# Patient Record
Sex: Female | Born: 1935
Health system: Southern US, Community
[De-identification: ages and names within clinical notes are randomized; demographics above are authoritative.]

## PROBLEM LIST (undated history)

## (undated) DIAGNOSIS — S72002A Fracture of unspecified part of neck of left femur, initial encounter for closed fracture: Secondary | ICD-10-CM

## (undated) DIAGNOSIS — E039 Hypothyroidism, unspecified: Secondary | ICD-10-CM

## (undated) DIAGNOSIS — I5030 Unspecified diastolic (congestive) heart failure: Secondary | ICD-10-CM

## (undated) DIAGNOSIS — E785 Hyperlipidemia, unspecified: Secondary | ICD-10-CM

## (undated) DIAGNOSIS — R310 Gross hematuria: Secondary | ICD-10-CM

## (undated) DIAGNOSIS — Z9289 Personal history of other medical treatment: Secondary | ICD-10-CM

## (undated) DIAGNOSIS — J449 Chronic obstructive pulmonary disease, unspecified: Secondary | ICD-10-CM

## (undated) DIAGNOSIS — Z85038 Personal history of other malignant neoplasm of large intestine: Secondary | ICD-10-CM

## (undated) DIAGNOSIS — N183 Chronic kidney disease, stage 3 (moderate): Secondary | ICD-10-CM

## (undated) DIAGNOSIS — D649 Anemia, unspecified: Secondary | ICD-10-CM

## (undated) DIAGNOSIS — R3129 Other microscopic hematuria: Secondary | ICD-10-CM

## (undated) DIAGNOSIS — I10 Essential (primary) hypertension: Secondary | ICD-10-CM

## (undated) DIAGNOSIS — J181 Lobar pneumonia, unspecified organism: Secondary | ICD-10-CM

## (undated) DIAGNOSIS — N3001 Acute cystitis with hematuria: Secondary | ICD-10-CM

## (undated) HISTORY — DX: Fracture of unspecified part of neck of left femur, initial encounter for closed fracture: S72.002A

## (undated) HISTORY — DX: Unspecified diastolic (congestive) heart failure: I50.30

## (undated) HISTORY — DX: Acute cystitis with hematuria: N30.01

## (undated) HISTORY — DX: Essential (primary) hypertension: I10

## (undated) HISTORY — DX: Hypothyroidism, unspecified: E03.9

## (undated) HISTORY — DX: Lobar pneumonia, unspecified organism: J18.1

## (undated) HISTORY — DX: Personal history of other malignant neoplasm of large intestine: Z85.038

## (undated) HISTORY — DX: Hyperlipidemia, unspecified: E78.5

## (undated) HISTORY — DX: Gross hematuria: R31.0

## (undated) HISTORY — DX: Anemia, unspecified: D64.9

## (undated) HISTORY — DX: Personal history of other medical treatment: Z92.89

## (undated) HISTORY — DX: Other microscopic hematuria: R31.29

## (undated) HISTORY — DX: Chronic obstructive pulmonary disease, unspecified: J44.9

## (undated) HISTORY — PX: ESOPHAGOGASTRODUODENOSCOPY: SHX1529

## (undated) HISTORY — DX: Chronic kidney disease, stage 3 (moderate): N18.3

---

## 1982-01-25 HISTORY — PX: CHOLECYSTECTOMY: SHX55

## 1998-10-26 DIAGNOSIS — I10 Essential (primary) hypertension: Secondary | ICD-10-CM

## 1998-10-26 HISTORY — DX: Essential (primary) hypertension: I10

## 1999-06-02 ENCOUNTER — Encounter: Payer: Self-pay | Admitting: Family Medicine

## 1999-06-02 ENCOUNTER — Encounter: Admission: RE | Admit: 1999-06-02 | Discharge: 1999-06-02 | Payer: Self-pay | Admitting: Family Medicine

## 1999-07-23 ENCOUNTER — Other Ambulatory Visit: Admission: RE | Admit: 1999-07-23 | Discharge: 1999-07-23 | Payer: Self-pay | Admitting: Family Medicine

## 2000-07-25 ENCOUNTER — Encounter: Payer: Self-pay | Admitting: Family Medicine

## 2000-07-25 LAB — CONVERTED CEMR LAB: Pap Smear: NORMAL

## 2000-08-01 ENCOUNTER — Other Ambulatory Visit: Admission: RE | Admit: 2000-08-01 | Discharge: 2000-08-01 | Payer: Self-pay | Admitting: Family Medicine

## 2000-08-02 ENCOUNTER — Encounter: Payer: Self-pay | Admitting: Family Medicine

## 2000-08-02 ENCOUNTER — Encounter: Admission: RE | Admit: 2000-08-02 | Discharge: 2000-08-02 | Payer: Self-pay | Admitting: Family Medicine

## 2001-08-04 ENCOUNTER — Encounter: Admission: RE | Admit: 2001-08-04 | Discharge: 2001-08-04 | Payer: Self-pay | Admitting: Family Medicine

## 2001-08-04 ENCOUNTER — Encounter: Payer: Self-pay | Admitting: Family Medicine

## 2002-08-07 ENCOUNTER — Encounter: Admission: RE | Admit: 2002-08-07 | Discharge: 2002-08-07 | Payer: Self-pay | Admitting: Family Medicine

## 2002-08-07 ENCOUNTER — Encounter: Payer: Self-pay | Admitting: Family Medicine

## 2002-12-26 DIAGNOSIS — E785 Hyperlipidemia, unspecified: Secondary | ICD-10-CM

## 2002-12-26 HISTORY — DX: Hyperlipidemia, unspecified: E78.5

## 2003-08-09 ENCOUNTER — Encounter: Admission: RE | Admit: 2003-08-09 | Discharge: 2003-08-09 | Payer: Self-pay | Admitting: Family Medicine

## 2004-01-03 ENCOUNTER — Ambulatory Visit: Payer: Self-pay | Admitting: Family Medicine

## 2004-01-07 ENCOUNTER — Ambulatory Visit: Payer: Self-pay | Admitting: Family Medicine

## 2004-02-21 ENCOUNTER — Ambulatory Visit: Payer: Self-pay | Admitting: Family Medicine

## 2004-07-08 ENCOUNTER — Ambulatory Visit: Payer: Self-pay | Admitting: Family Medicine

## 2004-12-25 ENCOUNTER — Encounter: Payer: Self-pay | Admitting: Family Medicine

## 2004-12-25 LAB — CONVERTED CEMR LAB: Hgb A1c MFr Bld: 5.5 %

## 2004-12-29 ENCOUNTER — Ambulatory Visit: Payer: Self-pay | Admitting: Family Medicine

## 2005-01-01 ENCOUNTER — Ambulatory Visit: Payer: Self-pay | Admitting: Family Medicine

## 2005-01-22 ENCOUNTER — Ambulatory Visit: Payer: Self-pay | Admitting: Family Medicine

## 2005-01-29 ENCOUNTER — Encounter: Admission: RE | Admit: 2005-01-29 | Discharge: 2005-01-29 | Payer: Self-pay | Admitting: Family Medicine

## 2005-02-01 ENCOUNTER — Ambulatory Visit: Payer: Self-pay | Admitting: Family Medicine

## 2005-02-25 ENCOUNTER — Encounter: Payer: Self-pay | Admitting: Family Medicine

## 2005-03-08 ENCOUNTER — Ambulatory Visit: Payer: Self-pay | Admitting: Family Medicine

## 2005-07-02 ENCOUNTER — Ambulatory Visit: Payer: Self-pay | Admitting: Family Medicine

## 2005-12-25 ENCOUNTER — Encounter: Payer: Self-pay | Admitting: Family Medicine

## 2005-12-25 LAB — CONVERTED CEMR LAB
Hgb A1c MFr Bld: 6.3 %
Microalbumin U total vol: 5.1 mg/L

## 2005-12-30 ENCOUNTER — Ambulatory Visit: Payer: Self-pay | Admitting: Family Medicine

## 2006-01-03 ENCOUNTER — Ambulatory Visit: Payer: Self-pay | Admitting: Family Medicine

## 2006-01-23 DIAGNOSIS — Z862 Personal history of diseases of the blood and blood-forming organs and certain disorders involving the immune mechanism: Secondary | ICD-10-CM | POA: Insufficient documentation

## 2006-01-24 ENCOUNTER — Encounter: Payer: Self-pay | Admitting: Cardiology

## 2006-01-24 ENCOUNTER — Observation Stay (HOSPITAL_COMMUNITY): Admission: EM | Admit: 2006-01-24 | Discharge: 2006-01-25 | Payer: Self-pay | Admitting: Emergency Medicine

## 2006-01-24 ENCOUNTER — Ambulatory Visit: Payer: Self-pay | Admitting: Internal Medicine

## 2006-01-24 ENCOUNTER — Ambulatory Visit: Payer: Self-pay | Admitting: Cardiology

## 2006-01-28 ENCOUNTER — Ambulatory Visit: Payer: Self-pay | Admitting: Family Medicine

## 2006-01-31 ENCOUNTER — Encounter: Admission: RE | Admit: 2006-01-31 | Discharge: 2006-01-31 | Payer: Self-pay | Admitting: Family Medicine

## 2006-02-02 ENCOUNTER — Ambulatory Visit: Payer: Self-pay | Admitting: Family Medicine

## 2006-02-08 ENCOUNTER — Encounter: Admission: RE | Admit: 2006-02-08 | Discharge: 2006-02-08 | Payer: Self-pay | Admitting: Family Medicine

## 2006-02-09 ENCOUNTER — Ambulatory Visit: Payer: Self-pay | Admitting: Family Medicine

## 2006-02-09 LAB — CONVERTED CEMR LAB
Ferritin: 76.2 ng/mL (ref 10.0–291.0)
Folate: 3.9 ng/mL
MCHC: 31.4 g/dL (ref 30.0–36.0)

## 2006-02-14 ENCOUNTER — Ambulatory Visit: Payer: Self-pay | Admitting: Family Medicine

## 2006-02-15 ENCOUNTER — Ambulatory Visit: Payer: Self-pay | Admitting: Gastroenterology

## 2006-02-16 ENCOUNTER — Ambulatory Visit: Payer: Self-pay | Admitting: Gastroenterology

## 2006-02-16 ENCOUNTER — Encounter (INDEPENDENT_AMBULATORY_CARE_PROVIDER_SITE_OTHER): Payer: Self-pay | Admitting: Specialist

## 2006-02-22 ENCOUNTER — Encounter (INDEPENDENT_AMBULATORY_CARE_PROVIDER_SITE_OTHER): Payer: Self-pay | Admitting: Specialist

## 2006-02-22 ENCOUNTER — Ambulatory Visit: Payer: Self-pay | Admitting: Gastroenterology

## 2006-02-22 LAB — CONVERTED CEMR LAB: BUN: 63 mg/dL — ABNORMAL HIGH (ref 6–23)

## 2006-02-25 ENCOUNTER — Ambulatory Visit (HOSPITAL_COMMUNITY): Admission: RE | Admit: 2006-02-25 | Discharge: 2006-02-25 | Payer: Self-pay | Admitting: Internal Medicine

## 2006-02-25 HISTORY — PX: OTHER SURGICAL HISTORY: SHX169

## 2006-02-28 ENCOUNTER — Ambulatory Visit: Payer: Self-pay | Admitting: Family Medicine

## 2006-03-02 ENCOUNTER — Ambulatory Visit: Payer: Self-pay | Admitting: Family Medicine

## 2006-03-02 LAB — CONVERTED CEMR LAB
CO2: 17 meq/L — ABNORMAL LOW (ref 19–32)
Calcium: 9.1 mg/dL (ref 8.4–10.5)
Chloride: 109 meq/L (ref 96–112)
Creatinine, Ser: 2.7 mg/dL — ABNORMAL HIGH (ref 0.4–1.2)
GFR calc non Af Amer: 18 mL/min
Potassium: 5.8 meq/L — ABNORMAL HIGH (ref 3.5–5.1)
Sodium: 134 meq/L — ABNORMAL LOW (ref 135–145)

## 2006-03-09 ENCOUNTER — Ambulatory Visit: Payer: Self-pay | Admitting: Family Medicine

## 2006-03-09 ENCOUNTER — Ambulatory Visit: Payer: Self-pay | Admitting: Cardiovascular Disease

## 2006-03-09 LAB — CONVERTED CEMR LAB
Albumin: 2.7 g/dL — ABNORMAL LOW (ref 3.5–5.2)
Alkaline Phosphatase: 51 units/L (ref 39–117)
Basophils Relative: 0.2 % (ref 0.0–1.0)
Calcium: 9 mg/dL (ref 8.4–10.5)
Eosinophils Absolute: 0.3 10*3/uL (ref 0.0–0.6)
Eosinophils Relative: 5.1 % — ABNORMAL HIGH (ref 0.0–5.0)
GFR calc Af Amer: 46 mL/min
GFR calc non Af Amer: 38 mL/min
Glucose, Bld: 99 mg/dL (ref 70–99)
MCHC: 33.1 g/dL (ref 30.0–36.0)
MCV: 79.3 fL (ref 78.0–100.0)
Monocytes Absolute: 0.6 10*3/uL (ref 0.2–0.7)
Monocytes Relative: 8.9 % (ref 3.0–11.0)
Neutrophils Relative %: 70.1 % (ref 43.0–77.0)
Platelets: 162 10*3/uL (ref 150–400)
RBC: 3.73 M/uL — ABNORMAL LOW (ref 3.87–5.11)
RDW: 26 % — ABNORMAL HIGH (ref 11.5–14.6)
Total Bilirubin: 0.8 mg/dL (ref 0.3–1.2)

## 2006-03-15 ENCOUNTER — Ambulatory Visit: Payer: Self-pay

## 2006-03-15 DIAGNOSIS — Z9289 Personal history of other medical treatment: Secondary | ICD-10-CM

## 2006-03-15 HISTORY — DX: Personal history of other medical treatment: Z92.89

## 2006-03-16 ENCOUNTER — Ambulatory Visit: Payer: Self-pay | Admitting: Family Medicine

## 2006-03-16 LAB — CONVERTED CEMR LAB
Basophils Absolute: 0.1 10*3/uL (ref 0.0–0.1)
Basophils Relative: 1 % (ref 0.0–1.0)
CO2: 27 meq/L (ref 19–32)
Creatinine, Ser: 1.1 mg/dL (ref 0.4–1.2)
Eosinophils Absolute: 0.2 10*3/uL (ref 0.0–0.6)
Eosinophils Relative: 3.1 % (ref 0.0–5.0)
GFR calc Af Amer: 61 mL/min
GFR calc non Af Amer: 50 mL/min
Iron: 51 ug/dL (ref 42–145)
MCHC: 33.2 g/dL (ref 30.0–36.0)
Monocytes Relative: 8.9 % (ref 3.0–11.0)
Neutrophils Relative %: 62.9 % (ref 43.0–77.0)
Platelets: 192 10*3/uL (ref 150–400)
RDW: 25.5 % — ABNORMAL HIGH (ref 11.5–14.6)
Vitamin B-12: 350 pg/mL (ref 211–911)
WBC: 6.2 10*3/uL (ref 4.5–10.5)

## 2006-03-23 ENCOUNTER — Encounter (INDEPENDENT_AMBULATORY_CARE_PROVIDER_SITE_OTHER): Payer: Self-pay | Admitting: Specialist

## 2006-03-23 ENCOUNTER — Inpatient Hospital Stay (HOSPITAL_COMMUNITY): Admission: RE | Admit: 2006-03-23 | Discharge: 2006-03-28 | Payer: Self-pay | Admitting: General Surgery

## 2006-03-25 HISTORY — PX: PARTIAL COLECTOMY: SHX5273

## 2006-04-17 ENCOUNTER — Ambulatory Visit: Payer: Self-pay | Admitting: Internal Medicine

## 2006-04-17 ENCOUNTER — Inpatient Hospital Stay (HOSPITAL_COMMUNITY): Admission: EM | Admit: 2006-04-17 | Discharge: 2006-04-20 | Payer: Self-pay | Admitting: Emergency Medicine

## 2006-04-18 ENCOUNTER — Encounter: Payer: Self-pay | Admitting: Internal Medicine

## 2006-04-22 ENCOUNTER — Ambulatory Visit: Payer: Self-pay | Admitting: Family Medicine

## 2006-05-02 ENCOUNTER — Ambulatory Visit: Payer: Self-pay | Admitting: Family Medicine

## 2006-05-02 LAB — CONVERTED CEMR LAB
ALT: 11 units/L (ref 0–40)
AST: 19 units/L (ref 0–37)
Basophils Absolute: 0 10*3/uL (ref 0.0–0.1)
Basophils Relative: 0.7 % (ref 0.0–1.0)
CO2: 24 meq/L (ref 19–32)
Calcium: 8.5 mg/dL (ref 8.4–10.5)
Chloride: 110 meq/L (ref 96–112)
Creatinine, Ser: 1.1 mg/dL (ref 0.4–1.2)
Eosinophils Relative: 1.1 % (ref 0.0–5.0)
Hemoglobin: 10.8 g/dL — ABNORMAL LOW (ref 12.0–15.0)
MCV: 89.8 fL (ref 78.0–100.0)
Monocytes Relative: 9.3 % (ref 3.0–11.0)
Neutro Abs: 4.6 10*3/uL (ref 1.4–7.7)
Platelets: 158 10*3/uL (ref 150–400)
Potassium: 4.2 meq/L (ref 3.5–5.1)
Retic Count, Absolute: 88.1 (ref 19.0–186.0)
Total Bilirubin: 0.8 mg/dL (ref 0.3–1.2)

## 2006-05-12 ENCOUNTER — Ambulatory Visit: Payer: Self-pay | Admitting: Family Medicine

## 2006-05-24 ENCOUNTER — Telehealth (INDEPENDENT_AMBULATORY_CARE_PROVIDER_SITE_OTHER): Payer: Self-pay | Admitting: *Deleted

## 2006-05-25 ENCOUNTER — Ambulatory Visit: Payer: Self-pay | Admitting: Family Medicine

## 2006-05-25 DIAGNOSIS — I5032 Chronic diastolic (congestive) heart failure: Secondary | ICD-10-CM

## 2006-05-25 DIAGNOSIS — R7303 Prediabetes: Secondary | ICD-10-CM

## 2006-06-02 ENCOUNTER — Ambulatory Visit: Payer: Self-pay | Admitting: Family Medicine

## 2006-06-02 LAB — CONVERTED CEMR LAB
Basophils Absolute: 0 10*3/uL (ref 0.0–0.1)
Eosinophils Relative: 2.5 % (ref 0.0–5.0)
Hemoglobin: 11.3 g/dL — ABNORMAL LOW (ref 12.0–15.0)
Lymphocytes Relative: 46.7 % — ABNORMAL HIGH (ref 12.0–46.0)
MCHC: 33.1 g/dL (ref 30.0–36.0)
Monocytes Absolute: 0.6 10*3/uL (ref 0.2–0.7)
Neutro Abs: 3.3 10*3/uL (ref 1.4–7.7)
Neutrophils Relative %: 42.5 % — ABNORMAL LOW (ref 43.0–77.0)
Platelets: 304 10*3/uL (ref 150–400)
RBC: 3.9 M/uL (ref 3.87–5.11)
RDW: 13.7 % (ref 11.5–14.6)
TSH: 4.18 microintl units/mL (ref 0.35–5.50)

## 2006-06-08 ENCOUNTER — Ambulatory Visit: Payer: Self-pay | Admitting: Family Medicine

## 2006-07-06 ENCOUNTER — Ambulatory Visit: Payer: Self-pay | Admitting: Family Medicine

## 2006-07-06 LAB — CONVERTED CEMR LAB
HCT: 35.3 % — ABNORMAL LOW (ref 36.0–46.0)
Hemoglobin: 11.9 g/dL — ABNORMAL LOW (ref 12.0–15.0)
Iron: 102 ug/dL (ref 42–145)
Lymphocytes Relative: 50.2 % — ABNORMAL HIGH (ref 12.0–46.0)
MCHC: 33.7 g/dL (ref 30.0–36.0)
Monocytes Relative: 7.3 % (ref 3.0–11.0)
Neutro Abs: 2.5 10*3/uL (ref 1.4–7.7)
Neutrophils Relative %: 39.4 % — ABNORMAL LOW (ref 43.0–77.0)
Platelets: 168 10*3/uL (ref 150–400)

## 2006-07-07 ENCOUNTER — Encounter: Payer: Self-pay | Admitting: Family Medicine

## 2006-07-07 DIAGNOSIS — I10 Essential (primary) hypertension: Secondary | ICD-10-CM | POA: Insufficient documentation

## 2006-07-07 DIAGNOSIS — E785 Hyperlipidemia, unspecified: Secondary | ICD-10-CM

## 2006-07-11 ENCOUNTER — Ambulatory Visit: Payer: Self-pay | Admitting: Family Medicine

## 2006-11-23 ENCOUNTER — Encounter: Payer: Self-pay | Admitting: Family Medicine

## 2007-01-09 ENCOUNTER — Ambulatory Visit: Payer: Self-pay | Admitting: Family Medicine

## 2007-01-09 DIAGNOSIS — D126 Benign neoplasm of colon, unspecified: Secondary | ICD-10-CM

## 2007-01-09 DIAGNOSIS — Z862 Personal history of diseases of the blood and blood-forming organs and certain disorders involving the immune mechanism: Secondary | ICD-10-CM

## 2007-01-09 LAB — CONVERTED CEMR LAB
Alkaline Phosphatase: 49 units/L (ref 39–117)
Basophils Absolute: 0 10*3/uL (ref 0.0–0.1)
Bilirubin, Direct: 0.2 mg/dL (ref 0.0–0.3)
CO2: 32 meq/L (ref 19–32)
Chloride: 108 meq/L (ref 96–112)
Creatinine, Ser: 1.3 mg/dL — ABNORMAL HIGH (ref 0.4–1.2)
Eosinophils Absolute: 0.2 10*3/uL (ref 0.0–0.6)
Eosinophils Relative: 4 % (ref 0.0–5.0)
GFR calc Af Amer: 50 mL/min
GFR calc non Af Amer: 41 mL/min
Glucose, Bld: 94 mg/dL (ref 70–99)
LDL Cholesterol: 97 mg/dL (ref 0–99)
Monocytes Absolute: 0.5 10*3/uL (ref 0.2–0.7)
Monocytes Relative: 8.5 % (ref 3.0–11.0)
Neutro Abs: 2.7 10*3/uL (ref 1.4–7.7)
Neutrophils Relative %: 48.8 % (ref 43.0–77.0)
RBC: 4.07 M/uL (ref 3.87–5.11)
RDW: 12.7 % (ref 11.5–14.6)
Sodium: 147 meq/L — ABNORMAL HIGH (ref 135–145)
Total Bilirubin: 0.7 mg/dL (ref 0.3–1.2)
Vitamin B-12: 636 pg/mL (ref 211–911)

## 2007-01-12 ENCOUNTER — Ambulatory Visit: Payer: Self-pay | Admitting: Family Medicine

## 2007-02-22 ENCOUNTER — Encounter: Payer: Self-pay | Admitting: Family Medicine

## 2007-03-03 ENCOUNTER — Encounter: Admission: RE | Admit: 2007-03-03 | Discharge: 2007-03-03 | Payer: Self-pay | Admitting: Family Medicine

## 2007-03-03 LAB — HM MAMMOGRAPHY: HM Mammogram: NORMAL

## 2007-03-06 ENCOUNTER — Encounter (INDEPENDENT_AMBULATORY_CARE_PROVIDER_SITE_OTHER): Payer: Self-pay | Admitting: *Deleted

## 2007-03-16 ENCOUNTER — Ambulatory Visit: Payer: Self-pay | Admitting: Gastroenterology

## 2007-03-16 ENCOUNTER — Encounter: Payer: Self-pay | Admitting: Family Medicine

## 2007-04-13 ENCOUNTER — Encounter: Payer: Self-pay | Admitting: Family Medicine

## 2007-04-13 ENCOUNTER — Ambulatory Visit: Payer: Self-pay | Admitting: Gastroenterology

## 2007-04-13 LAB — HM COLONOSCOPY

## 2007-07-12 ENCOUNTER — Ambulatory Visit: Payer: Self-pay | Admitting: Family Medicine

## 2007-07-12 LAB — CONVERTED CEMR LAB
Basophils Absolute: 0 10*3/uL (ref 0.0–0.1)
Eosinophils Relative: 2.2 % (ref 0.0–5.0)
Hgb A1c MFr Bld: 5.5 % (ref 4.6–6.0)
Lymphocytes Relative: 37.6 % (ref 12.0–46.0)
Monocytes Absolute: 0.5 10*3/uL (ref 0.1–1.0)
Monocytes Relative: 7.9 % (ref 3.0–12.0)
Neutro Abs: 3.6 10*3/uL (ref 1.4–7.7)
Neutrophils Relative %: 52.3 % (ref 43.0–77.0)
RBC: 4.24 M/uL (ref 3.87–5.11)

## 2007-07-17 ENCOUNTER — Ambulatory Visit: Payer: Self-pay | Admitting: Family Medicine

## 2007-10-31 ENCOUNTER — Ambulatory Visit: Payer: Self-pay | Admitting: Family Medicine

## 2008-01-23 ENCOUNTER — Ambulatory Visit: Payer: Self-pay | Admitting: Family Medicine

## 2008-01-23 LAB — CONVERTED CEMR LAB
ALT: 14 units/L (ref 0–35)
Basophils Absolute: 0 10*3/uL (ref 0.0–0.1)
CO2: 31 meq/L (ref 19–32)
Calcium: 9.4 mg/dL (ref 8.4–10.5)
Creatinine,U: 302.7 mg/dL
Free T4: 0.7 ng/dL (ref 0.6–1.6)
Glucose, Bld: 107 mg/dL — ABNORMAL HIGH (ref 70–99)
HDL: 53 mg/dL (ref >39.0)
Hemoglobin: 13.4 g/dL (ref 12.0–15.0)
Iron: 77 ug/dL (ref 42–145)
Microalb Creat Ratio: 15.9 mg/g (ref 0.0–30.0)
Monocytes Absolute: 0.5 10*3/uL (ref 0.1–1.0)
Neutro Abs: 3.8 10*3/uL (ref 1.4–7.7)
Neutrophils Relative %: 55.6 % (ref 43.0–77.0)
Platelets: 152 10*3/uL (ref 150–400)
Potassium: 3.7 meq/L (ref 3.5–5.1)
RBC: 4.14 M/uL (ref 3.87–5.11)
TSH: 7.13 microintl units/mL — ABNORMAL HIGH (ref 0.35–5.50)
Total Bilirubin: 0.6 mg/dL (ref 0.3–1.2)
Total CHOL/HDL Ratio: 2.9
Triglycerides: 92 mg/dL (ref 0–149)
VLDL: 18 mg/dL (ref 0–40)
Vitamin B-12: >1500 pg/mL — ABNORMAL HIGH (ref 211–911)
WBC: 6.8 10*3/uL (ref 4.5–10.5)

## 2008-01-30 ENCOUNTER — Ambulatory Visit: Payer: Self-pay | Admitting: Family Medicine

## 2008-03-26 ENCOUNTER — Encounter: Payer: Self-pay | Admitting: Family Medicine

## 2008-06-12 ENCOUNTER — Encounter: Payer: Self-pay | Admitting: Family Medicine

## 2008-07-30 ENCOUNTER — Ambulatory Visit: Payer: Self-pay | Admitting: Family Medicine

## 2008-08-16 ENCOUNTER — Encounter: Payer: Self-pay | Admitting: Family Medicine

## 2008-08-19 ENCOUNTER — Encounter: Payer: Self-pay | Admitting: Family Medicine

## 2008-09-04 ENCOUNTER — Ambulatory Visit: Payer: Self-pay | Admitting: Family Medicine

## 2008-09-04 LAB — CONVERTED CEMR LAB
Chloride: 107 meq/L (ref 96–112)
Hgb A1c MFr Bld: 5.6 % (ref 4.6–6.5)
Potassium: 4.1 meq/L (ref 3.5–5.1)
Sodium: 143 meq/L (ref 135–145)

## 2008-09-09 ENCOUNTER — Ambulatory Visit: Payer: Self-pay | Admitting: Family Medicine

## 2008-10-31 ENCOUNTER — Encounter: Payer: Self-pay | Admitting: Family Medicine

## 2008-11-01 ENCOUNTER — Encounter: Payer: Self-pay | Admitting: Family Medicine

## 2008-11-10 ENCOUNTER — Encounter: Payer: Self-pay | Admitting: Family Medicine

## 2008-11-12 ENCOUNTER — Encounter: Payer: Self-pay | Admitting: Family Medicine

## 2008-12-05 ENCOUNTER — Ambulatory Visit: Payer: Self-pay | Admitting: Family Medicine

## 2008-12-05 LAB — CONVERTED CEMR LAB
CO2: 29 meq/L (ref 19–32)
Chloride: 107 meq/L (ref 96–112)
Creatinine, Ser: 1.5 mg/dL — ABNORMAL HIGH (ref 0.4–1.2)
GFR calc non Af Amer: 35 mL/min (ref >60)
Glucose, Bld: 97 mg/dL (ref 70–99)
Potassium: 4.2 meq/L (ref 3.5–5.1)

## 2008-12-11 ENCOUNTER — Ambulatory Visit: Payer: Self-pay | Admitting: Family Medicine

## 2009-02-25 ENCOUNTER — Ambulatory Visit: Payer: Self-pay | Admitting: Family Medicine

## 2009-02-25 DIAGNOSIS — E559 Vitamin D deficiency, unspecified: Secondary | ICD-10-CM | POA: Insufficient documentation

## 2009-02-25 LAB — CONVERTED CEMR LAB
ALT: 14 units/L (ref 0–35)
AST: 16 units/L (ref 0–37)
Albumin: 3.3 g/dL — ABNORMAL LOW (ref 3.5–5.2)
Alkaline Phosphatase: 46 units/L (ref 39–117)
Basophils Absolute: 0 10*3/uL (ref 0.0–0.1)
Basophils Relative: 0 % (ref 0.0–3.0)
Bilirubin, Direct: 0.1 mg/dL (ref 0.0–0.3)
Cholesterol: 161 mg/dL (ref 0–200)
HCT: 38.2 % (ref 36.0–46.0)
HDL: 57.8 mg/dL (ref >39.00)
Hemoglobin: 12.7 g/dL (ref 12.0–15.0)
Iron: 88 ug/dL (ref 42–145)
Lymphocytes Relative: 28.1 % (ref 12.0–46.0)
Lymphs Abs: 2 10*3/uL (ref 0.7–4.0)
MCV: 95.8 fL (ref 78.0–100.0)
Monocytes Relative: 6.1 % (ref 3.0–12.0)
Neutro Abs: 4.5 10*3/uL (ref 1.4–7.7)
Neutrophils Relative %: 63.7 % (ref 43.0–77.0)
Platelets: 134 10*3/uL — ABNORMAL LOW (ref 150.0–400.0)
RDW: 13.3 % (ref 11.5–14.6)
TSH: 6.55 microintl units/mL — ABNORMAL HIGH (ref 0.35–5.50)
Total Protein: 6.3 g/dL (ref 6.0–8.3)
VLDL: 15.8 mg/dL (ref 0.0–40.0)
WBC: 7 10*3/uL (ref 4.5–10.5)

## 2009-03-03 ENCOUNTER — Ambulatory Visit: Payer: Self-pay | Admitting: Family Medicine

## 2009-05-28 ENCOUNTER — Ambulatory Visit: Payer: Self-pay | Admitting: Family Medicine

## 2009-06-02 ENCOUNTER — Ambulatory Visit: Payer: Self-pay | Admitting: Family Medicine

## 2009-09-01 ENCOUNTER — Encounter (INDEPENDENT_AMBULATORY_CARE_PROVIDER_SITE_OTHER): Payer: Self-pay | Admitting: *Deleted

## 2009-12-02 ENCOUNTER — Ambulatory Visit: Payer: Self-pay | Admitting: Family Medicine

## 2010-02-15 ENCOUNTER — Encounter: Payer: Self-pay | Admitting: Gastroenterology

## 2010-02-15 ENCOUNTER — Encounter: Payer: Self-pay | Admitting: Family Medicine

## 2010-02-24 NOTE — Letter (Signed)
Summary: Nadara Eaton letter  [place] at [facility]  [address] [place], [place] [address]   Phone: [phone]  Fax: [phone]       09/01/2009 MRN: [id number]  HALAH Stutz [address] [place], [place]  [address]  Dear Ms. Shiffler,  [facility] [place], and [place] announce the retirement of [name], M.D., from full-time practice at the [facility] office effective July 24, 2009 and his plans of returning part-time.  It is important to Dr. Hetty Ely and to our practice that you understand that [facility] has seven physicians in our office for your health care needs.  We will continue to offer the same exceptional care that you have today.    Dr. Hetty Ely has spoken to many of you about his plans for retirement and returning part-time in the fall.   We will continue to work with you through the transition to schedule appointments for you in the office and meet the high standards that [place] is committed to.   Again, it is with great pleasure that we share the news that Dr. Hetty Ely will return to [facility] at [facility] in October of 2011 with a reduced schedule.    If you have any questions, or would like to request an appointment with one of our physicians, please call us at [phone] and press the option for Scheduling an appointment.  We take pleasure in providing you with excellent patient care and look forward to seeing you at your next office visit.  Our [facility] Physicians are:  Tillman Abide, M.D. Laurita Quint, M.D. Roxy Manns, M.D. Kerby Nora, M.D. Hannah Beat, M.D. Ruthe Mannan, M.D. We proudly welcomed Raechel Ache, M.D. and Eustaquio Boyden, M.D. to the practice in July/August 2011.  Sincerely,  [facility]

## 2010-02-24 NOTE — Assessment & Plan Note (Signed)
Summary: CPX   Vital Signs:  Patient profile:   77 year old female Weight:      163 pounds Temp:     98.1 degrees F oral Pulse rate:   60 / minute Pulse rhythm:   regular BP sitting:   140 / 62  (left arm) Cuff size:   large  Vitals Entered By: Sydell Axon LPN (March 03, 2009 2:40 PM) CC: 30 Minute checkup, had a colonoscopy 03/09   History of Present Illness: Pt herenfor followup, feels well with no complaint.   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 Take 1 Tablet By Mouth Every Morning 2)  Metoprolol Tartrate 100 Mg Tabs (Metoprolol Tartrate) .... Take 1/2 Tablet By Mouth Twice A Day 3)  Vitamin B-12 1000 Mcg Tabs (Cyanocobalamin) .Marland Kitchen.. 1 Daily By Mouth 4)  Cvs Iron 325 (65 Fe) Mg Tabs (Ferrous Sulfate) .Marland Kitchen.. 1 Twice A Day By Mouth 5)  Lisinopril 5 Mg Tabs (Lisinopril) .... One Tab By Mouth Once Daily.  Allergies: No Known Drug Allergies  Past History:  Past Medical History: Last updated: 07/07/2006 Hypertension (10/1998) Hyperlipidemia (12/2002) Diabetes mellitus, type II (12/2005) Congestive heart failure (12/2005) Hyperlipidemia Hypertension  Past Surgical History: Last updated: 04/18/2007 NSVD x4  at home    Choleycystectomy 1984 [facility] Anemia CHF (Puton Lasix)  12/30-01/25/2006 Colonoscopy Neoplasm @ Hepatic Flexure int hemms  02/22/2006 EGD B9 polyp  H.Pylori bx-  "dilated"  polyps MRI Abd  bilat renal cysts no mets   02/25/2006 ETT Adenosine Myoview  low risk  03/15/2006 R Partial Colectomy  03/25/2006 Colonoscopy Hemicolectomy stable  Hemms (Dr Arlyce Dice)  04/13/2007  Family History: Last updated: 05/25/2006 Father dec 84  Blocked Bowels Old Age Mother dec Brain hemm Brother dec 27 Ulcers due ETOH Sister dec Stroke Natural causes Sister dec DM Htn  Social History: Last updated: 05/25/2006 Occupation: [name]  29yrs Retired Married Widow 1996 Lives w/ daughter and youngest son  other daughter lives next door Never Smoked Alcohol use-no Drug use-no  Risk Factors: Alcohol Use: 0 (03/03/2009) Caffeine Use: 1 (03/03/2009) Exercise: yes (03/03/2009)  Risk Factors: Smoking Status: never (03/03/2009) Passive Smoke Exposure: no (03/03/2009)  Review of Systems General:  Denies chills, fatigue, fever, sweats, weakness, and weight loss. Eyes:  Denies blurring, discharge, eye irritation, and eye pain. ENT:  Denies decreased hearing, earache, and ringing in ears. CV:  Denies chest pain or discomfort, fainting, fatigue, palpitations, shortness of breath with exertion, and swelling of feet. Resp:  Denies cough, shortness of breath, and wheezing; walking in mtns causes mild SOB.Marland Kitchen GI:  Denies abdominal pain, bloody stools, change in bowel habits, constipation, dark tarry stools, diarrhea, indigestion, loss  of appetite, nausea, vomiting, vomiting blood, and yellowish skin color. GU:  Denies discharge, dysuria, nocturia, and urinary frequency. MS:  Denies joint pain, joint redness, low back pain, muscle aches, cramps, and stiffness. Derm:  Denies dryness, itching, and rash. Neuro:  Denies numbness, poor balance, tingling, and tremors.  Physical Exam  General:  Well-developed,well-nourished,in no  acute distress; alert,appropriate and cooperative throughout examination, pleasant and talkative. Head:  Normocephalic and atraumatic without obvious abnormalities. No apparent alopecia or balding. Sinuses NT. Eyes:  Conjunctiva clear bilaterally.  Ears:  External ear exam shows no significant lesions or deformities.  Otoscopic examination reveals clear canals, tympanic membranes are intact bilaterally without bulging, retraction, inflammation or discharge. Hearing is grossly normal bilaterally. Mild cerumen bilat. Nose:  External nasal examination shows no deformity or inflammation. Nasal mucosa are pink and moist without lesions or exudates. Mouth:  Oral mucosa and oropharynx without lesions or exudates.  Teeth in good repair. Neck:  No deformities, masses, or tenderness noted. Chest Wall:  No deformities, masses, or tenderness noted. Lungs:  Normal respiratory effort, chest expands symmetrically. Lungs are clear to auscultation, no crackles or wheezes. Heart:  Normal rate and regular rhythm. S1 and S2 normal without gallop, murmur, click, rub or other extra sounds. Abdomen:  Bowel sounds positive,abdomen soft and non-tender without masses, organomegaly or hernias noted. Msk:  No deformity or scoliosis noted of thoracic or lumbar spine.   Pulses:  R and L carotid,radial,femoral,dorsalis pedis and posterior tibial pulses are full and equal bilaterally Extremities:  No clubbing, cyanosis, edema, or deformity noted with normal full range of motion of all joints.   Neurologic:  No cranial nerve deficits noted. Station and gait are normal. Plantar reflexes are down-going bilaterally. DTRs are symmetrical throughout. Sensory, motor and coordinative functions appear intact. Skin:  Intact without suspicious lesions or rashes Cervical Nodes:  No lymphadenopathy noted Inguinal Nodes:  No significant adenopathy Psych:  Cognition and judgment appear intact. Alert and cooperative with normal attention span  and concentration. No apparent delusions, illusions, hallucinations   Impression & Recommendations:  Problem # 1:  UNSPECIFIED VITAMIN D DEFICIENCY (ICD-268.9) Assessment Unchanged Start we4ekly med and recheck in 3 mos.  Problem # 2:  POLYP, COLON (ICD-211.3) Assessment: Unchanged Last clonoscopy 2 years ago. No mention when to repeat....presume not needed.  Problem # 3:  ANEMIA, B12 DEFICIENCY (ICD-281.1) Assessment: Unchanged  Adequately replaced. Cont curr medication. Her updated medication list for this problem includes:    Vitamin B-12 1000 Mcg Tabs (Cyanocobalamin) .Marland Kitchen... 1 daily by mouth    Cvs Iron 325 (65 Fe) Mg Tabs (Ferrous sulfate) .Marland Kitchen... 1 twice a day by mouth  Hgb: 12.7 (02/25/2009)   Hct: 38.2 (02/25/2009)   Platelets: 134.0 (02/25/2009) RBC: 3.98 (02/25/2009)   RDW: 13.3 (02/25/2009)   WBC: 7.0 (02/25/2009) MCV: 95.8 (02/25/2009)   MCHC: 33.3 (02/25/2009) Retic Ct: 88.1 K/uL (05/02/2006)   Ferritin: 111.3 (03/16/2006) Iron: 88 (02/25/2009)   B12: 824 (02/25/2009)   Folate: 3.9 (02/09/2006)   TSH: 6.55 (02/25/2009)  Problem # 4:  HYPERTENSION (ICD-401.9) Assessment: Deteriorated Will follow as has been well-controlled in the past. Her updated medication list for this problem includes:    Lasix 20 Mg Tabs (Furosemide) .Marland Kitchen... Take 1 tablet by mouth every morning    Metoprolol Tartrate 100 Mg Tabs (Metoprolol tartrate) .Marland Kitchen... Take 1/2 tablet by mouth twice a day    Lisinopril 5 Mg Tabs (Lisinopril) ..... One tab by mouth once daily.  BP today: 140/62 Prior BP: 100/58 (  12/11/2008)  Labs Reviewed: K+: 3.5 (02/25/2009) Creat: : 1.3 (02/25/2009)   Chol: 161 (02/25/2009)   HDL: 57.80 (02/25/2009)   LDL: 87 (02/25/2009)   TG: 79.0 (02/25/2009)  Problem # 5:  HYPERLIPIDEMIA (ICD-272.4) Assessment: Unchanged Good nos. Cont. Labs Reviewed: SGOT: 16 (02/25/2009)   SGPT: 14 (02/25/2009)   HDL:57.80 (02/25/2009), 53.0 (01/23/2008)  LDL:87 (02/25/2009), 85 (01/23/2008)   Chol:161 (02/25/2009), 156 (01/23/2008)  Trig:79.0 (02/25/2009), 92 (01/23/2008)  Problem # 6:  AODM (ICD-250.00) Assessment: Unchanged Currently euglycemic as is. Her updated medication list for this problem includes:    Lisinopril 5 Mg Tabs (Lisinopril) ..... One tab by mouth once daily.  Labs Reviewed: Creat: 1.3 (02/25/2009)   Microalbumin: 5.1 (12/25/2005) Reviewed HgBA1c results: 5.5 (02/25/2009)  5.6 (09/04/2008)  Problem # 7:  CHF (ICD-428.0) Assessment: Unchanged Stable. Her updated medication list for this problem includes:    Lasix 20 Mg Tabs (Furosemide) .Marland Kitchen... Take 1 tablet by mouth every morning    Metoprolol Tartrate 100 Mg Tabs (Metoprolol tartrate) .Marland Kitchen... Take 1/2 tablet by mouth twice a day    Lisinopril 5 Mg Tabs (Lisinopril) ..... One tab by mouth once daily.  Problem # 8:  THYROID STIMULATING HORMONE, ABNORMAL  HIGH (ICD-246.9) Assessment: Unchanged Minimally elevated, will follow.  Complete Medication List: 1)  Lasix 20 Mg Tabs (Furosemide) .... Take 1 tablet by mouth every morning 2)  Metoprolol Tartrate 100 Mg Tabs (Metoprolol tartrate) .... Take 1/2 tablet by mouth twice a day 3)  Vitamin B-12 1000 Mcg Tabs (Cyanocobalamin) .Marland Kitchen.. 1 daily by mouth 4)  Cvs Iron 325 (65 Fe) Mg Tabs (Ferrous sulfate) .Marland Kitchen.. 1 twice a day by mouth 5)  Lisinopril 5 Mg Tabs (Lisinopril) .... One tab by mouth once daily. 6)  Vitamin D (ergocalciferol) 50000 Unit Caps (Ergocalciferol) .... One tab by mouth once a week  Patient Instructions: 1)  RTC 3 mos, vit D lvl 268.9 Prescriptions: VITAMIN D (ERGOCALCIFEROL) 50000 UNIT CAPS (ERGOCALCIFEROL) one tab by mouth once a week  #4 x 3   Entered and Authorized by:   Shaune Leeks MD   Signed by:   Shaune Leeks MD on 03/03/2009   Method used:   Electronically to        [facility]  [address] [id number] (retail)       [address]       [place], [place]  [address]       Ph: [phone]       Fax:  [phone]   RxID:   [id number]   Current Allergies (reviewed today): No known allergies

## 2010-02-24 NOTE — Assessment & Plan Note (Signed)
Summary: F/U AFTER LABS / LFW   Vital Signs:  Patient profile:   77 year old female Weight:      158.75 pounds BMI:     26.51 Temp:     97.8 degrees F oral Pulse rate:   68 / minute Pulse rhythm:   regular BP sitting:   120 / 70  (left arm) Cuff size:   large  Vitals Entered By: Sydell Axon LPN (Jun 02, 1608 8:53 AM) CC: 3 Month follow-up after labs   History of Present Illness: Pt here for 3 mo followup of Vit D def repl with prescription Vit D weekly. She feel well and only complaint is right second toe red ditstally without pain or discharge. She feels fine.  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 Take 1 Tablet By Mouth Every Morning 2)  Metoprolol Tartrate 100 Mg Tabs (Metoprolol Tartrate) .... Take 1/2 Tablet By Mouth Twice A Day 3)  Vitamin B-12 1000 Mcg Tabs (Cyanocobalamin) .Marland Kitchen.. 1 Daily By Mouth 4)  Cvs Iron 325 (65 Fe) Mg Tabs (Ferrous Sulfate) .Marland Kitchen.. 1 Twice A Day By Mouth 5)  Lisinopril 5 Mg Tabs (Lisinopril) .... One Tab By Mouth Once Daily. 6)  Vitamin D (Ergocalciferol) 50000 Unit Caps (Ergocalciferol) .... One Tab By Mouth Once A Week  Allergies: No Known Drug Allergies  Physical Exam  General:  Well-developed,well-nourished,in no acute distress; alert,appropriate and cooperative throughout examination, pleasant and talkative. Head:  Normocephalic and atraumatic without obvious abnormalities. No apparent alopecia or balding. Sinuses NT. Eyes:  Conjunctiva clear bilaterally.  Ears:   External ear exam shows no significant lesions or deformities.  Otoscopic examination reveals clear canals, tympanic membranes are intact bilaterally without bulging, retraction, inflammation or discharge. Hearing is grossly normal bilaterally. Mild cerumen bilat. Nose:  External nasal examination shows no deformity or inflammation. Nasal mucosa are pink and moist without lesions or exudates. Mouth:  Oral mucosa and oropharynx without lesions or exudates.  Teeth in good repair. Neck:  No deformities, masses, or tenderness noted. Lungs:  Normal respiratory effort, chest expands symmetrically. Lungs are clear to auscultation, no crackles or wheezes. Heart:  Normal rate and regular rhythm. S1 and S2 normal without gallop, murmur, click, rub or other extra sounds. Extremities:  R second toe red on entire dital phalanxe without focal lesion, no fluctulance. Toe overrides Gt toe and may be mildly abraded. She has soft shoes on today.   Impression & Recommendations:  Problem # 1:  UNSPECIFIED VITAMIN D DEFICIENCY (ICD-268.9) Assessment Improved Therapeutic. Go to OTC 1000Iu two times a day. Recheck in future.  Problem # 2:  HYPERTENSION (ICD-401.9) Assessment: Improved Back to baseline. Cont as is. Her updated medication list for this problem includes:    Lasix 20 Mg Tabs (Furosemide) .Marland Kitchen... Take 1 tablet by mouth every morning    Metoprolol Tartrate 100 Mg Tabs (Metoprolol tartrate) .Marland Kitchen... Take 1/2 tablet by mouth twice a day    Lisinopril 5 Mg Tabs (Lisinopril) ..... One tab by mouth once daily.  Problem # 3:  ERYTHEMA OF RIGHT SECOND TOE (ICD-695.9) Assessment: New Presumably from abrasion while overriding gt toe. Soak in warm water. RTC if worsens. May need to see podiatry.  Complete Medication List: 1)  Lasix 20 Mg Tabs (Furosemide) .... Take 1 tablet by mouth every morning 2)  Metoprolol Tartrate 100 Mg Tabs (Metoprolol tartrate) .... Take 1/2 tablet by mouth twice a day 3)  Vitamin B-12  1000 Mcg Tabs (Cyanocobalamin) .Marland Kitchen.. 1 daily by mouth 4)  Cvs Iron 325 (65 Fe) Mg Tabs (Ferrous sulfate) .Marland Kitchen.. 1 twice a day by mouth 5)  Lisinopril 5 Mg Tabs (Lisinopril) .... One tab by mouth once daily. 6)  Vitamin D (ergocalciferol) 50000 Unit Caps (Ergocalciferol) .... One tab by mouth once a week  Patient Instructions: 1)  start Vit D 1000Iu twice a day. 2)  RTC in Feb for Comp Exam, labs prior. 3)  Soak right toe in warm water three times a day. 4)  Return if worsens.  Current Allergies (reviewed today): No known allergies

## 2010-02-24 NOTE — Assessment & Plan Note (Signed)
Summary: FLU SHOT/CLE   Nurse Visit   Allergies: No Known Drug Allergies  Orders Added: 1)  Flu Vaccine 3yrs + MEDICARE PATIENTS [Q2039] 2)  Administration Flu vaccine - MCR [G0008]  Flu Vaccine Consent Questions     Do you have a history of severe allergic reactions to this vaccine? no    Any prior history of allergic reactions to egg and/or gelatin? no    Do you have a sensitivity to the preservative Thimersol? no    Do you have a past history of Guillan-Barre Syndrome? no    Do you currently have an acute febrile illness? no    Have you ever had a severe reaction to latex? no    Vaccine information given and explained to patient? yes    Are you currently pregnant? no    Lot Number:[id number]   Exp Date:07/25/2010   Site Given  Left Deltoid IM 

## 2010-02-25 ENCOUNTER — Ambulatory Visit: Admit: 2010-02-25 | Payer: Self-pay | Admitting: Family Medicine

## 2010-02-25 ENCOUNTER — Other Ambulatory Visit: Payer: Self-pay | Admitting: Family Medicine

## 2010-02-25 ENCOUNTER — Encounter (INDEPENDENT_AMBULATORY_CARE_PROVIDER_SITE_OTHER): Payer: Self-pay | Admitting: *Deleted

## 2010-02-25 ENCOUNTER — Encounter: Payer: Self-pay | Admitting: Family Medicine

## 2010-02-25 ENCOUNTER — Other Ambulatory Visit (INDEPENDENT_AMBULATORY_CARE_PROVIDER_SITE_OTHER): Payer: MEDICARE

## 2010-02-25 DIAGNOSIS — E119 Type 2 diabetes mellitus without complications: Secondary | ICD-10-CM

## 2010-02-25 DIAGNOSIS — D126 Benign neoplasm of colon, unspecified: Secondary | ICD-10-CM

## 2010-02-25 DIAGNOSIS — E785 Hyperlipidemia, unspecified: Secondary | ICD-10-CM

## 2010-02-25 DIAGNOSIS — D518 Other vitamin B12 deficiency anemias: Secondary | ICD-10-CM

## 2010-02-25 DIAGNOSIS — D510 Vitamin B12 deficiency anemia due to intrinsic factor deficiency: Secondary | ICD-10-CM

## 2010-02-25 DIAGNOSIS — E079 Disorder of thyroid, unspecified: Secondary | ICD-10-CM

## 2010-02-25 DIAGNOSIS — E559 Vitamin D deficiency, unspecified: Secondary | ICD-10-CM

## 2010-02-25 DIAGNOSIS — I509 Heart failure, unspecified: Secondary | ICD-10-CM

## 2010-02-25 LAB — RENAL FUNCTION PANEL
Albumin: 3.6 g/dL (ref 3.5–5.2)
BUN: 28 mg/dL — ABNORMAL HIGH (ref 6–23)
CO2: 30 mEq/L (ref 19–32)
Creatinine, Ser: 2.1 mg/dL — ABNORMAL HIGH (ref 0.4–1.2)
GFR: 24.06 mL/min — ABNORMAL LOW (ref >60.00)

## 2010-02-25 LAB — LIPID PANEL
Cholesterol: 163 mg/dL (ref 0–200)
HDL: 53.4 mg/dL (ref >39.00)
Total CHOL/HDL Ratio: 3
Triglycerides: 86 mg/dL (ref 0.0–149.0)

## 2010-02-25 LAB — CBC WITH DIFFERENTIAL/PLATELET
Basophils Absolute: 0 10*3/uL (ref 0.0–0.1)
Basophils Relative: 0.6 % (ref 0.0–3.0)
Eosinophils Absolute: 0.2 10*3/uL (ref 0.0–0.7)
HCT: 37.8 % (ref 36.0–46.0)
Hemoglobin: 12.8 g/dL (ref 12.0–15.0)
Lymphs Abs: 2.5 10*3/uL (ref 0.7–4.0)
MCHC: 33.7 g/dL (ref 30.0–36.0)
MCV: 96.1 fl (ref 78.0–100.0)
Monocytes Absolute: 0.4 10*3/uL (ref 0.1–1.0)
Neutro Abs: 4.1 10*3/uL (ref 1.4–7.7)
RDW: 13.9 % (ref 11.5–14.6)

## 2010-02-25 LAB — HEPATIC FUNCTION PANEL
Bilirubin, Direct: 0.1 mg/dL (ref 0.0–0.3)
Total Bilirubin: 0.3 mg/dL (ref 0.3–1.2)
Total Protein: 6.3 g/dL (ref 6.0–8.3)

## 2010-02-25 LAB — MICROALBUMIN / CREATININE URINE RATIO
Creatinine,U: 322.4 mg/dL
Microalb Creat Ratio: 0.3 mg/g (ref 0.0–30.0)
Microalb, Ur: 1 mg/dL (ref 0.0–1.9)

## 2010-02-26 LAB — CONVERTED CEMR LAB: Vit D, 25-Hydroxy: 52 ng/mL (ref 30–89)

## 2010-03-04 ENCOUNTER — Encounter (INDEPENDENT_AMBULATORY_CARE_PROVIDER_SITE_OTHER): Payer: MEDICARE | Admitting: Family Medicine

## 2010-03-04 ENCOUNTER — Encounter: Payer: Self-pay | Admitting: Family Medicine

## 2010-03-04 DIAGNOSIS — N183 Chronic kidney disease, stage 3 unspecified: Secondary | ICD-10-CM

## 2010-03-04 DIAGNOSIS — Z0000 Encounter for general adult medical examination without abnormal findings: Secondary | ICD-10-CM

## 2010-03-04 HISTORY — DX: Chronic kidney disease, stage 3 unspecified: N18.30

## 2010-03-04 LAB — HM DIABETES FOOT EXAM

## 2010-03-12 NOTE — Assessment & Plan Note (Signed)
Summary: CPX/ DLO   Vital Signs:  Patient profile:   77 year old female Weight:      152.75 pounds BMI:     25.51 Temp:     97.1 degrees F oral Pulse rate:   60 / minute Pulse rhythm:   regular BP sitting:   140 / 60  (left arm) Cuff size:   large  Vitals Entered By: Sydell Axon LPN (March 04, 2010 10:54 AM) CC: 30 Minute checkup, had a colonoscopy 03/09, Preventive Care   History of Present Illness: Pt here for Comp Exam, she feels well and has no complaints. She drinks lots of fluid. She urinates a lot after her fluid pill, then goes about every two hours after that. It sounds like she is well hydrated.  Preventive Screening-Counseling & Management  Alcohol-Tobacco     Alcohol drinks/day: 0     Smoking Status: never     Passive Smoke Exposure: no  Caffeine-Diet-Exercise     Caffeine use/day: 1     Does Patient Exercise: yes     Type of exercise: walking     Times/week: 5  Problems Prior to Update: 1)  Erythema of Right Second Toe  (ICD-695.9) 2)  Unspecified Vitamin D Deficiency  (ICD-268.9) 3)  Polyp, Colon  (ICD-211.3) 4)  Anemia, B12 Deficiency  (ICD-281.1) 5)  Hypertension  (ICD-401.9) 6)  Hyperlipidemia  (ICD-272.4) 7)  Aodm  (ICD-250.00) 8)  CHF  (ICD-428.0) 9)  Ankle Edema  (ICD-782.3) 10)  Neop, Malignant, Hepatic Flexure/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 Take 1 Tablet By Mouth Every Morning 2)  Metoprolol Tartrate 100 Mg Tabs (Metoprolol Tartrate) .... Take 1/2 Tablet By Mouth Twice A Day 3)  Vitamin B-12 1000 Mcg Tabs (Cyanocobalamin) .Marland Kitchen.. 1 Daily By Mouth 4)  Cvs Iron 325 (65 Fe) Mg Tabs (Ferrous Sulfate) .Marland Kitchen.. 1 Twice A Day By Mouth 5)  Lisinopril 5 Mg Tabs (Lisinopril) .... One Tab By Mouth Once Daily. 6)  Vitamin D (Ergocalciferol) 50000 Unit Caps (Ergocalciferol) .... One Tab By Mouth Once A  Week  Current Medications (verified): 1)  Lasix 20 Mg Tabs (Furosemide) .... Take 1 Tablet By Mouth Every Morning 2)  Metoprolol Tartrate 100 Mg Tabs (Metoprolol Tartrate) .... Take 1/2 Tablet By Mouth Twice A Day 3)  Vitamin B-12 1000 Mcg Tabs (Cyanocobalamin) .Marland Kitchen.. 1 Daily By Mouth 4)  Cvs Iron 325 (65 Fe) Mg Tabs (Ferrous Sulfate) .Marland Kitchen.. 1 Twice A Day By Mouth 5)  Lisinopril 5 Mg Tabs (Lisinopril) .... One Tab By Mouth Once Daily. 6)  Vitamin D3 1000 Unit Tabs (Cholecalciferol) .... Take One By Mouth Two Times A Day  Allergies: No Known Drug Allergies  Past History:  Past Medical History: Last updated: 07/07/2006 Hypertension (10/1998) Hyperlipidemia (12/2002) Diabetes mellitus, type II (12/2005) Congestive heart failure (12/2005) Hyperlipidemia Hypertension  Past Surgical History: Last updated: 04/18/2007 NSVD x4  at home  Choleycystectomy 1984 [facility] Anemia CHF ([place] Lasix)  12/30-01/25/2006 Colonoscopy Neoplasm @ Hepatic Flexure int hemms  02/22/2006 EGD B9 polyp  H.Pylori bx-  "dilated"  polyps MRI Abd  bilat renal cysts no mets   02/25/2006 ETT Adenosine Myoview  low risk  03/15/2006 R Partial Colectomy  03/25/2006 Colonoscopy Hemicolectomy stable  Hemms (Dr Arlyce Dice)  04/13/2007  Family History: Last updated: 05/25/2006 Father dec 84  Blocked Bowels Old Age Mother  dec Brain hemm Brother dec 27 Ulcers due ETOH Sister dec Stroke Natural causes Sister dec DM Htn  Social History: Last updated: 03/04/2010 Occupation: [employer]  44yrs Retired Married Widow 1996 Lives w/ youngest daughter and her husband  and youngest son  other daughter lives next door Never Smoked Alcohol use-no Drug use-no  Risk Factors: Alcohol Use: 0 (03/04/2010) Caffeine Use: 1 (03/04/2010) Exercise: yes (03/04/2010)  Risk Factors: Smoking Status: never (03/04/2010) Passive Smoke Exposure: no (03/04/2010)  Social History: Occupation: Systems analyst  75yrs Retired Married Widow 1996 Lives w/  youngest daughter and her husband  and youngest son  other daughter lives next door Never Smoked Alcohol use-no Drug use-no  Review of Systems General:  Denies chills, fatigue, fever, sweats, weakness, and weight loss. Eyes:  Denies blurring, discharge, and eye pain. ENT:  Denies decreased hearing, ear discharge, earache, and ringing in ears. CV:  Complains of shortness of breath with exertion; denies chest pain or discomfort, fainting, fatigue, palpitations, swelling of feet, and swelling of hands; if walks too much or too fast, gets SOB.Marland Kitchen Resp:  Denies cough, shortness of breath, and wheezing. GI:  Denies abdominal pain, bloody stools, change in bowel habits, constipation, dark tarry stools, diarrhea, indigestion, loss of appetite, nausea, vomiting, vomiting blood, and yellowish skin color. GU:  Denies discharge, dysuria, nocturia, and urinary frequency. MS:  Denies joint pain, low back pain, muscle aches, cramps, and stiffness. Derm:  Denies dryness, itching, and rash. Neuro:  Denies memory loss, numbness, poor balance, tingling, and tremors.  Physical Exam  General:  Well-developed,well-nourished,in no acute distress; alert,appropriate and cooperative throughout examination, pleasant and talkative. Head:  Normocephalic and atraumatic without obvious abnormalities. No apparent alopecia or balding. Sinuses NT. Eyes:  Conjunctiva clear bilaterally.  Ears:  External ear exam shows no significant lesions or deformities.  Otoscopic examination reveals clear canals, tympanic membranes are intact bilaterally without bulging, retraction, inflammation or discharge. Hearing is grossly normal bilaterally. Mild cerumen bilat. Nose:  External nasal examination shows no deformity or inflammation. Nasal mucosa are pink and moist without lesions or exudates. Mouth:  Oral mucosa and oropharynx without lesions or exudates.  Teeth in good repair. Neck:  No deformities, masses, or tenderness noted. Chest  Wall:  No deformities, masses, or tenderness noted. Breasts:  No mass, nodules, thickening, tenderness, bulging, retraction, inflamation, nipple discharge or skin changes noted.   Lungs:  Normal respiratory effort, chest expands symmetrically. Lungs are clear to auscultation, no crackles or wheezes. Heart:  Normal rate and regular rhythm. S1 and S2 normal without gallop, murmur, click, rub or other extra sounds. Abdomen:  Bowel sounds positive,abdomen soft and non-tender without masses, organomegaly or hernias noted. Rectal:  Not done  Genitalia:  Not done Msk:  No deformity or scoliosis noted of thoracic or lumbar spine.   Pulses:  R and L carotid,radial,femoral,dorsalis pedis and posterior tibial pulses are full and equal bilaterally Extremities:  No clubbing, cyanosis, edema, or deformity noted with normal full range of motion of all joints.   Neurologic:  No cranial nerve deficits noted. Station and gait are normal. Sensory, motor and coordinative functions appear intact. Skin:  Intact without suspicious lesions or rashes Cervical Nodes:  No lymphadenopathy noted Axillary Nodes:  No palpable lymphadenopathy Inguinal Nodes:  No significant adenopathy Psych:  Cognition and judgment appear intact. Alert and cooperative with normal attention span and concentration. No apparent delusions, illusions, hallucinations  Diabetes Management Exam:    Foot Exam (with socks and/or shoes not present):  Sensory-Pinprick/Light touch:          Left medial foot (L-4): normal          Left dorsal foot (L-5): normal          Left lateral foot (S-1): normal          Right medial foot (L-4): normal          Right dorsal foot (L-5): normal          Right lateral foot (S-1): normal       Sensory-Monofilament:          Left foot: normal          Right foot: normal       Inspection:          Left foot: normal          Right foot: normal       Nails:          Left foot: normal          Right foot:  normal   Impression & Recommendations:  Problem # 1:  PREVENTIVE HEALTH CARE (ICD-V70.0) Assessment Comment Only I have personally reviewed the Medicare Annual Wellness questionnaire and have noted 1.   The patient's medical and social history 2.   Their use of alcohol, tobacco or illicit drugs 3.   Their current medications and supplements 4.   The patient's functional ability including ADL's, fall risks, home safety risks and hearing or visual             impairment. Vision and hearing both appear adequate. 5.   Diet and physical activities 6.   Evidence for depression or mood disorders  Problem # 2:  UNSPECIFIED VITAMIN D DEFICIENCY (ICD-268.9) Assessment: Unchanged Good level. Cont curr dosing.  Problem # 3:  ANEMIA, B12 DEFICIENCY (ICD-281.1) Assessment: Unchanged  Adequate. Cont curr dosing daily. Her updated medication list for this problem includes:    Vitamin B-12 1000 Mcg Tabs (Cyanocobalamin) .Marland Kitchen... 1 daily by mouth    Cvs Iron 325 (65 Fe) Mg Tabs (Ferrous sulfate) .Marland Kitchen... 1 twice a day by mouth  Hgb: 12.8 (02/25/2010)   Hct: 37.8 (02/25/2010)   Platelets: 129.0 (02/25/2010) RBC: 3.94 (02/25/2010)   RDW: 13.9 (02/25/2010)   WBC: 7.3 (02/25/2010) MCV: 96.1 (02/25/2010)   MCHC: 33.7 (02/25/2010) Retic Ct: 88.1 K/uL (05/02/2006)   Ferritin: 111.3 (03/16/2006) Iron: 104 (02/25/2010)   B12: >1500 pg/mL (02/25/2010)   Folate: 3.9 (02/09/2006)   TSH: 6.48 (02/25/2010)  Problem # 4:  HYPERTENSION (ICD-401.9) Due to BP elevation, DM and renal compromise, will try changing Metoprolol to Amlodipine and recheck BP and renal function. Her updated medication list for this problem includes:    Lasix 20 Mg Tabs (Furosemide) .Marland Kitchen... Take 1 tablet by mouth every morning    Amlodipine Besylate 5 Mg Tabs (Amlodipine besylate) ..... One tab by mouth at night.    Lisinopril 5 Mg Tabs (Lisinopril) ..... One tab by mouth once daily.  BP today: 140/60 Prior BP: 120/70 (06/02/2009)  Labs  Reviewed: K+: 4.2 (02/25/2010) Creat: : 2.1 (02/25/2010)   Chol: 163 (02/25/2010)   HDL: 53.40 (02/25/2010)   LDL: 92 (02/25/2010)   TG: 86.0 (02/25/2010)  Problem # 5:  HYPERLIPIDEMIA (ICD-272.4) Acceptable nos on diet alone. Will not expose to statin therapy at this point. FH neg for CAD. Labs Reviewed: SGOT: 15 (02/25/2010)   SGPT: 14 (02/25/2010)   HDL:53.40 (02/25/2010), 57.80 (02/25/2009)  LDL:92 (02/25/2010), 87 (02/25/2009)  Chol:163 (02/25/2010),  161 (02/25/2009)  Trig:86.0 (02/25/2010), 79.0 (02/25/2009)  Problem # 6:  AODM (ICD-250.00) Great control. Diagnosis of DM has been based on one A1C in 12/07 that was 6.3 with glu over 126. Sugars before and after have been mildly elevated but never in the diabetic range. Will call pt glucose intolerant. Her updated medication list for this problem includes:    Lisinopril 5 Mg Tabs (Lisinopril) ..... One tab by mouth once daily.  Labs Reviewed: Creat: 2.1 (02/25/2010)   Microalbumin: 5.1 (12/25/2005) Reviewed HgBA1c results: 5.4 (02/25/2010)  5.5 (02/25/2009)  Problem # 7:  NEOP, MALIGNANT, HEPATIC FLEXURE/NODE NEG (ICD-153.0) Assessment: Unchanged Due colonoscopy this Mar. Should be hearing from GI soon.  Problem # 8:  THYROID STIMULATING HORMONE, ABNORMAL  HIGH (ICD-246.9) Assessment: Unchanged Mildly elevated but assx and no advance...will cont to follow.  Problem # 9:  RENAL INSUFFICIENCY, ACUTE (ICD-585.9) Assessment: New  Will recheck next visit. Changing Metoprolol to Amlodipine.  Labs Reviewed: BUN: 28 (02/25/2010)   Cr: 2.1 (02/25/2010)    Hgb: 12.8 (02/25/2010)   Hct: 37.8 (02/25/2010)   Ca++: 9.8 (02/25/2010)   Phos: 4.2 (02/25/2010) TP: 6.3 (02/25/2010)   Alb: 3.6 (02/25/2010)  Complete Medication List: 1)  Lasix 20 Mg Tabs (Furosemide) .... Take 1 tablet by mouth every morning 2)  Amlodipine Besylate 5 Mg Tabs (Amlodipine besylate) .... One tab by mouth at night. 3)  Vitamin B-12 1000 Mcg Tabs (Cyanocobalamin)  .Marland Kitchen.. 1 daily by mouth 4)  Cvs Iron 325 (65 Fe) Mg Tabs (Ferrous sulfate) .Marland Kitchen.. 1 twice a day by mouth 5)  Lisinopril 5 Mg Tabs (Lisinopril) .... One tab by mouth once daily. 6)  Vitamin D3 1000 Unit Tabs (Cholecalciferol) .... Take one by mouth two times a day  PAP Screening:    Last PAP smear:  07/25/2000  Mammogram Screening:    Last Mammogram:  03/03/2007  Osteoporosis Risk Assessment:  Risk Factors for Fracture or Low Bone Density:   Race (White or Asian):     yes   Smoking status:       never  Immunization & Chemoprophylaxis:    Tetanus vaccine: Td  (12/26/2002)    Influenza vaccine: Fluvax 3+  (12/02/2009)    Pneumovax: Pneumovax  (01/01/2005)  Patient Instructions: 1)  Please get eye exam scheduled. 2)  RTC 2 mos, Bmet prior 401.9 3)  Rename pt glucose intol not diabetic. Prescriptions: AMLODIPINE BESYLATE 5 MG TABS (AMLODIPINE BESYLATE) one tab by mouth at night.  #30 x 12   Entered and Authorized by:   Shaune Leeks MD   Signed by:   Shaune Leeks MD on 03/04/2010   Method used:   Electronically to        [facility]  [address] [id number] ([facility])       [address]       [place], [place]  [address]       Ph: [phone]       Fax: [phone]   RxID:   [id number]    Orders Added: 1)  Est. Patient 65& > [[id number]]    Current Allergies (reviewed today): No known allergies

## 2010-03-14 ENCOUNTER — Encounter: Payer: Self-pay | Admitting: Family Medicine

## 2010-03-26 HISTORY — PX: COLONOSCOPY: SHX174

## 2010-04-29 ENCOUNTER — Other Ambulatory Visit: Payer: Self-pay | Admitting: Family Medicine

## 2010-04-29 DIAGNOSIS — I10 Essential (primary) hypertension: Secondary | ICD-10-CM

## 2010-04-30 ENCOUNTER — Other Ambulatory Visit (INDEPENDENT_AMBULATORY_CARE_PROVIDER_SITE_OTHER): Payer: MEDICARE | Admitting: Family Medicine

## 2010-04-30 DIAGNOSIS — I10 Essential (primary) hypertension: Secondary | ICD-10-CM

## 2010-04-30 LAB — BASIC METABOLIC PANEL
BUN: 27 mg/dL — ABNORMAL HIGH (ref 6–23)
Calcium: 9.3 mg/dL (ref 8.4–10.5)
GFR: 29.2 mL/min — ABNORMAL LOW (ref >60.00)
Glucose, Bld: 84 mg/dL (ref 70–99)
Potassium: 4.3 mEq/L (ref 3.5–5.1)

## 2010-05-06 ENCOUNTER — Ambulatory Visit (INDEPENDENT_AMBULATORY_CARE_PROVIDER_SITE_OTHER): Payer: Medicare Other | Admitting: Family Medicine

## 2010-05-06 ENCOUNTER — Encounter: Payer: Self-pay | Admitting: Family Medicine

## 2010-05-06 DIAGNOSIS — N189 Chronic kidney disease, unspecified: Secondary | ICD-10-CM

## 2010-05-06 DIAGNOSIS — I10 Essential (primary) hypertension: Secondary | ICD-10-CM

## 2010-05-06 DIAGNOSIS — R7309 Other abnormal glucose: Secondary | ICD-10-CM

## 2010-05-06 DIAGNOSIS — R7303 Prediabetes: Secondary | ICD-10-CM

## 2010-05-06 NOTE — Progress Notes (Signed)
  Subjective:    Patient ID: [name], female    DOB: [birth year], 77 y.o.   MRN: [id number]  HPI Pt here for recheck of BP and renal function. We switched from Metoprolol to Amlodipine 3 months ago. She is tolerating the change well and has had no problems.  She had an eye exam about 2 weeks ago and was told all was ok. She was dilated then. Her daughter is calling GI today for a GI appt.     Review of Systems  Constitutional: Negative for fever, chills, diaphoresis, activity change and fatigue.  HENT: Negative for ear pain, congestion, rhinorrhea and postnasal drip.   Eyes: Negative for redness.  Respiratory: Negative for cough, chest tightness, shortness of breath and wheezing.   Cardiovascular: Negative for chest pain.       Objective:   Physical Exam  Constitutional: She appears well-developed and well-nourished. No distress.  HENT:  Head: Normocephalic and atraumatic.  Right Ear: External ear normal.  Left Ear: External ear normal.  Nose: Nose normal.  Mouth/Throat: Oropharynx is clear and moist. No oropharyngeal exudate.  Eyes: Conjunctivae and EOM are normal. Pupils are equal, round, and reactive to light.  Neck: Normal range of motion. Neck supple. No thyromegaly present.  Cardiovascular: Normal rate, regular rhythm and normal heart sounds.   Pulmonary/Chest: Effort normal and breath sounds normal. She has no wheezes. She has no rales.  Lymphadenopathy:    She has no cervical adenopathy.  Skin: She is not diaphoretic.          Assessment & Plan:

## 2010-05-06 NOTE — Assessment & Plan Note (Addendum)
Good improvement with change to Amlodipine. Renal function improved as well. Cont. BP Readings from Last 3 Encounters:  05/06/10 110/60  03/04/10 140/60  06/02/09 120/70

## 2010-05-06 NOTE — Assessment & Plan Note (Signed)
Improved. We are headed in right direction. Cont curr meds.

## 2010-05-06 NOTE — Patient Instructions (Signed)
RTC 2/13 for Comp Exam, labs prior

## 2010-05-06 NOTE — Assessment & Plan Note (Addendum)
FBS nml. Great control. Glu 84. Cont curr regimen.

## 2010-06-09 ENCOUNTER — Other Ambulatory Visit: Payer: Self-pay | Admitting: Family Medicine

## 2010-06-09 NOTE — Assessment & Plan Note (Signed)
[facility]                         GASTROENTEROLOGY OFFICE NOTE   ANAH, BILLARD                      MRN:          [id number]  DATE:03/16/2007                            DOB:          [birth year]    PROBLEM:  History of colon cancer.   HISTORY OF PRESENT ILLNESS:  [name] has returned for scheduled  followup. A year ago, she underwent a right hemicolectomy for a colon  cancer. She did not receive any further therapy. She currently has no GI  complaints. Specifically, she is without abdominal pain or change of  bowel habits.   MEDICATIONS:  Include metoprolol, iron, Lasix and vitamin B12.   PHYSICAL EXAMINATION:  VITAL SIGNS:  Pulse 60, blood pressure 122/60.  HEENT: EO MI.  PERRLA.  Sclerae are anicteric.  Conjunctivae are pink.  NECK:  Supple without thyromegaly, adenopathy or carotid bruits.  CHEST:  Clear to auscultation and percussion without adventitious  sounds.  CARDIAC:  Regular rhythm; normal S1 S2.  There are no murmurs, gallops  or rubs.  ABDOMEN:  Bowel sounds are normoactive.  Abdomen is soft, nontender and  nondistended.  There are no abdominal masses, tenderness, splenic  enlargement or hepatomegaly.  EXTREMITIES:  Full range of motion.  No cyanosis, clubbing or edema.  RECTAL:  Deferred.   IMPRESSION:  History of colon cancer.   RECOMMENDATION:  Followup colonoscopy.     Barbette Hair. Arlyce Dice, MD,FACG  Electronically Signed    RDK/MedQ  DD: 03/16/2007  DT: 03/16/2007  Job #: [id number]   cc:   [name], MD

## 2010-06-12 NOTE — Letter (Signed)
February 11, 2006    Barbette Hair. Arlyce Dice, MD,FACG  [address]  [place], [place] [address]   RE:  [name], [name]  MRN:  [id number]  /  DOB:  24-Aug-1920   Dear Cleone Slim:   You will be seeing a patient by the name of [name], an 77-year-  old white female, on January 22.  She was initially sent for anemia,  unknown etiology, had been hospitalized and transfused with no known  reason for the anemia.  She is B-12 and iron deficient, presumably from  an acute blood loss and I sent her for your evaluation, presumed  colonoscopy.   Today, a daughter calls and relates that the mom is having trouble  swallowing with lunch and supper, which is new to me and was not  discussed when I saw her.  It would be worth pursuing that as well.   Her past medical history is significant for hypertension since 2000,  elevated TSH since 1993 but not requiring replacement at this point,  elevated cholesterol, mildly elevated glucose, which turned into  diabetes in 2007, and CHF while she was in the hospital.  She was in the  hospital from December 30 to December 31, was put on Lasix, and  transfused.   As I said, her medicines include metoprolol 100 mg b.i.d., K-Dur 20 mEq  in the morning, Lasix 20 mg in the morning (both started in December),  lisinopril 10 mg daily, and iron (started in December as well), which  she is taking 3 times a day.   I appreciate your seeing her.  She is not allergic to anything.  I look  forward to your evaluation.    Sincerely,      [name], MD  Electronically Signed    RNS/MedQ  DD: 02/11/2006  DT: 02/11/2006  Job #: [id number]

## 2010-06-12 NOTE — Assessment & Plan Note (Signed)
[facility]                                 ON-CALL NOTE   MYKA, HITZ                        MRN:          [id number]  DATE:01/23/2006                            DOB:          [birth year]    The daughter calls and states that she is having problems which she  thinks was a medication, although describes it as intermittent spells of  shortness of breath and swelling in her ankles, and she has had  inability to lie flat at night breathing for the last couple of nights.  She states that she has been on metoprolol for a while and was recently  placed on lisinopril and wonders if it could be related to that.  Sitting at rest she does not appear short of breath right now.  However,  just a little bit earlier today she had a spell with dyspnea and  swelling of her feet.  She denies any fever or significant cough.  She  has never had any problem like this before.  I discussed with daughter  the possibility of new-onset heart failure that I doubt was related to  the medicine but is unclear at this point, and if she is truly short of  breath and has swelling in her ankles of new onset to go to the  emergency room or at least see Dr. Hetty Ely tomorrow in the office ASAP.     Neta Mends. Fabian Sharp, MD  Electronically Signed    WKP/MedQ  DD: 01/24/2006  DT: 01/24/2006  Job #: [id number]

## 2010-06-12 NOTE — H&P (Signed)
NAMEGERARDA, CONKLIN NO.:  [id number]   MEDICAL RECORD NO.:  [id number]          PATIENT TYPE:  OBV   LOCATION:  4735                         FACILITY:  [facility]   PHYSICIAN:  Willow Ora, MD           DATE OF BIRTH:  May 16, 1920   DATE OF ADMISSION:  01/23/2006  DATE OF DISCHARGE:                              HISTORY & PHYSICAL   CHIEF COMPLAINT:  Orthopnea.   HISTORY OF PRESENT ILLNESS:  Ms. Romano is an 77 year old white female  who was doing well up until a few days ago when she developed shortness  of breath and orthopnea.  Her symptoms got worse in the last couple of  days, and she came to the ER for evaluation.  On December 10, her  primary care doctor, Dr. Hetty Ely stopped the hydrochlorothiazide 50 one  p.o. daily and potassium 20 one p.o. daily and placed her on lisinopril  10 mg.  That is the only medication change in the recent past.  She was  left on metoprolol 100 mg 1 p.o. daily.  At the emergency room, she was  found to have anemia and be volume overloaded and was admitted for  further care.   PAST MEDICAL HISTORY:  1. Hypertension.  2. Cholecystectomy, remote.  3. Denies any history of known anemia or previous colonoscopy.   SOCIAL HISTORY:  Does not smoke or drink.  She is a widow and lives with  her daughter.   FAMILY HISTORY:  Is noncontributory at this time.   REVIEW OF SYSTEMS:  Denies any cough, fever or chest pain.  She has  noticed bilateral lower extremity edema for the last few days.  No  nausea, vomiting, diarrhea.  No blood in the stools.  No melena.  Denies  any abdominal pain.   MEDICATIONS:  1. Metoprolol 100 one p.o. daily.  2. Lisinopril 10 one p.o. daily for the last 3 weeks.   ALLERGIES:  NO KNOWN DRUG ALLERGIES.   PHYSICAL EXAMINATION:  The patient is alert, oriented.  She remains  seated in order to breath better.  She is afebrile.  Pulse 69,  respiration 24, blood pressure 177/65.  NECK:  She has JVD at around 10  cm at 45 degrees.  LUNGS:  Reveals decreased breath sounds at bases.  CARDIOVASCULAR:  Regular rate and rhythm without murmur.  ABDOMEN:  Is nondistended, soft, good bowel sounds, no organomegaly.  EXTREMITIES:  She has 2+ pitting edema in both legs.  It is symmetric.  NEUROLOGICAL EXAM:  Speech and motor are normal.   LABORATORY AND X-RAYS:  Chest x-ray showed cardiomegaly with mild  interstitial changes.  EKG showed a normal sinus rhythm without acute  changes.  Hemoccult is reportedly negative.  White count is 6.1 with a  hemoglobin of 7.6 and platelets of 255.  MCV is 71 which is slightly  low.  Per chart review, her hemoglobin was 11.2 on December of 2006.  BNP is 579, creatinine 1.9.  Cardiac enzymes x1 are negative.  Potassium  3.2, blood sugar 102.   ASSESSMENT AND  PLAN:  1. The patient is admitted with new onset of congestive heart failure.      She will be admitted to telemetry.  Cardiac enzymes will be drawn,      and we will do an echocardiogram.  Will consider calling      cardiology.  Possible trigger of this episode is the      discontinuation of her diuretics.  2. The patient has anemia.  The review of systems is negative for any      GI symptoms.  Apparently, she has not had any colonoscopy in the      past.  The onset of anemia is unclear.  I suspect this is subacute.      The patient is to be transfused while she is in the emergency room.      Will need her iron levels as well as B12 and folic acid levels.  If      she remains stable, she will most likely need an outpatient workup.  3. She will be admitted for observation.      Willow Ora, MD  Electronically Signed     JP/MEDQ  D:  01/24/2006  T:  01/24/2006  Job:  [id number]

## 2010-06-12 NOTE — Letter (Signed)
February 15, 2006    Mrs. [name]   RE:  MYLO, DRISKILL  MRN:  [id number]  /  DOB:  Dec 22, 1920   Dear [name],:   It is my pleasure to have treated you recently as a new patient in my  office.  I appreciate your confidence and the opportunity to participate  in your care.   Since I do have a busy inpatient endoscopy schedule and office schedule,  my office hours vary weekly.  I am, however, available for emergency  calls every day through my office.  If I cannot promptly meet an urgent  office appointment, another one of our gastroenterologists will be able  to assist you.   My well-trained staff are prepared to help you at all times.  For  emergencies after office hours, a physician from our gastroenterology  section is always available through my 24-hour answering service.   While you are under my care, I encourage discussion of your questions  and concerns, and I will be happy to return your calls as soon as I am  available.   Once again, I welcome you as a new patient and I look forward to a happy  and healthy relationship.    Sincerely,      Barbette Hair. Arlyce Dice, MD,FACG  Electronically Signed   RDK/MedQ  DD: 02/15/2006  DT: 02/15/2006  Job #: [id number]

## 2010-06-12 NOTE — Discharge Summary (Signed)
[name], [name] NO.:  [id number]   MEDICAL RECORD NO.:  [id number]          PATIENT TYPE:  OBV   LOCATION:  4735                         FACILITY:  [facility]   PHYSICIAN:  Willow Ora, MD           DATE OF BIRTH:  04-10-1920   DATE OF ADMISSION:  01/23/2006  DATE OF DISCHARGE:  01/24/2006                               DISCHARGE SUMMARY   DISCHARGE DIAGNOSES:  1. Acute congestive heart failure exacerbation with orthopnea.  2. Hypertension.  3. Microcytic anemia.   HISTORY OF PRESENT ILLNESS:  Ms. [name] is an 77 year old white female  admitted on January 23, 2006 with a chief complaint of orthopnea.  She  noted that her symptoms became worse in the last couple of days.  She  presented to the emergency department for evaluation.  She reportedly  had been on hydrochlorothiazide with potassium which was discontinued on  December10, 2007 by her primary care physician.  At that time, she was  placed on lisinopril 10 mg p.o. daily.  She was admitted for further  evaluation and treatment.   PAST MEDICAL HISTORY:  1. Hypertension.  2. Remote cholecystectomy.   COURSE OF HOSPITALIZATION:  1. Acute CHF exacerbation.  The patient was admitted and was given 20      mg of IV Lasix.  She diuresed appropriately with this dose, and her      symptoms have improved.  She will be continued on Lasix 20 mg p.o.      daily with K-Dur 20 mEq p.o. daily at the time of discharge.  The      patient did undergo a 2-D echo during this admission, and we are      currently awaiting the report on this echo at the time of this      dictation.  The patient was noted to have an elevated BNP of 579 on      admission.  Followup BNP is pending at the time of dictation.      Cardiac enzymes are negative x2.  Third set is currently pending.  2. Microcytic anemia.  The patient was noted on admission to have a      hemoglobin of 7.6.  Last hemoglobin available on E-chart was 11.2      in  December of2006.  She received 1 unit of packed red blood cells.      She is noted to have a normal retic count.  Iron studies, B12 and      folate are currently pending at the time of this dictation.  Her      fecal occult blood was negative, and there are no signs of acute GI      loss.  She will need an outpatient colonoscopy.  Will defer to the      patient's primary when clinically stable.   PERTINENT LABORATORIES AT THE TIME OF DISCHARGE:  BUN 25, creatinine  1.1, hemoglobin 8.3, hematocrit 26.9.   MEDICATIONS AT THE TIME OF DISCHARGE:  1. Lisinopril 10 mg p.o. daily.  2. Metoprolol 100 mg p.o.  daily as before.  3. Lasix 20 mg p.o. daily.  4. K-Dur 20 mEq p.o. daily as before.   DISPOSITION:  The patient will be discharged to home.   FOLLOWUP:  The patient is to follow up with Dr. Laurita Quint on  [place], 2007 as scheduled.  She will need a follow-up hemoglobin at  that time and review of final 2-D echo results.      Sandford Craze, NP      Willow Ora, MD  Electronically Signed    MO/MEDQ  D:  01/24/2006  T:  01/24/2006  Job:  [id number]   cc:   [name], MD

## 2010-06-12 NOTE — Discharge Summary (Signed)
[name], KANNAN               ACCOUNT NO.:  [id number]   MEDICAL RECORD NO.:  [id number]          PATIENT TYPE:  INP   LOCATION:  4735                         FACILITY:  [facility]   PHYSICIAN:  Sharlet Salina T. Hoxworth, M.D.DATE OF BIRTH:  Feb 08, 1920   DATE OF ADMISSION:  03/23/2006  DATE OF DISCHARGE:  03/28/2006                               DISCHARGE SUMMARY   DISCHARGE DIAGNOSIS:  Carcinoma of the right colon.   OPERATIONS AND PROCEDURES:  Right hemicolectomy, Dr. Johna Sheriff,  03/23/2006.   HISTORY OF PRESENT ILLNESS:  This patient is an 77 year old female  admitted to [facility] 01/23/2006 with weakness, shortness of breath  and she was found to be markedly anemic.  Hemoglobin was 7.6 with  secondary heart failure.  She was worked up transfused and subsequently  underwent outpatient colonoscopy showing carcinoma of the ascending  colon, biopsy confirmed.  After workup and discussion in the office, she  is at electively admitted for right hemicolectomy.   PAST MEDICAL HISTORY:  1. Heart failure as above but no other history of heart disease.  This      was felt secondary to anemia.  2. She was also treated for hypertension.   PREVIOUS SURGERY:  Remote open cholecystectomy.   MEDICATIONS:  1. Metoprolol 100 mg twice daily.  2. Lasix 20 mg daily.  3. Lisinopril 10 mg bedtime.  4. Iron sulfate three times a day.  5  Klor-Con 20 daily.   ALLERGIES:  None.   For social history, family history, review of systems, see detailed H&P.   PHYSICAL EXAMINATION:  VITAL SIGNS:  She is 5 feet 7 inches, 182 pounds.  Vital signs within normal limits.  GENERAL:  Elderly, somewhat frail-appearing white female.  Remainder of the exam was unremarkable with no palpable abdominal masses  or organomegaly.   LABORATORY:  Hemoglobin 11, PT/PTT normal.  Potassium was 3.2.  LFTs  normal.   HOSPITAL COURSE:  The patient was admitted on the morning of her  procedure and underwent an uneventful  right hemicolectomy.  Her  postoperative course was uncomplicated.  Her hemoglobin did drop to 8.9,  and she was transfused due to her previous history of inability to  tolerate anemia with heart failure.  Pathology revealed T3N0 carcinoma  with 18 nodes negative.  She is starting clear liquids by 02/29 and  Foley was discontinued.  Follow-up  hemoglobin was 9.4 on 03/02 on the postoperative day #4.  At this point,  she was advanced to a regular diet which she tolerated well.  She is  discharged home on 03/28/2006.  Wounds healing primarily.  Abdomen is  benign.  She is up and about without difficulty and bowels moving.  Follow-up will be my office in one week.      Lorne Skeens. Hoxworth, M.D.  Electronically Signed     BTH/MEDQ  D:  05/02/2006  T:  05/03/2006  Job:  [id number]

## 2010-06-12 NOTE — Discharge Summary (Signed)
NAMESHEETAL, LYALL NO.:  [id number]   MEDICAL RECORD NO.:  [id number]          PATIENT TYPE:  OBV   LOCATION:  4735                         FACILITY:  [facility]   PHYSICIAN:  Willow Ora, MD           DATE OF BIRTH:  03-19-1920   DATE OF ADMISSION:  01/23/2006  DATE OF DISCHARGE:  01/25/2006                               DISCHARGE SUMMARY   ADDENDUM:  [name] was admitted with orthopnea, presumed new onset  of CHF as well as anemia.  She was supposed to go home yesterday  however, her anemia persisted after one unit of blood.  She was held in  the hospital and received a second unit of blood.  This morning her  vital signs are stable and she feels well.  Blood work shows  reticulocyte count of 1.2, which is lower than expected given the degree  of anemia.  B12 level is 198, which is low, folate is normal.  Iron  level is also low at 17 and the hemoccult was negative.  Echo was normal  with good ventricular function.  At this point, it is unlikely she had  CHF given the normal echo.  She rather was profoundly anemic and that  most likely triggered her symptoms.   She will be discharged home on:  1. Lisinopril 10, one a day.  2. Metoprolol 100, once a day.  3. Lasix 20, once a day.  4. Potassium 20, once a day.  5. I am also going to add iron 325, one p.o. t.i.d.  6. She will receive a B12 shot today and she will need one B12 shot      weekly x4 and then once a month.   She is planning to see her doctor, Dr. Hetty Ely on February 02, 2006.  I  emphasized the need for further workup for her iron deficiency anemia in  the outpatient setting.  She also understands that she will need long  term B12 supplementation.      Willow Ora, MD  Electronically Signed     JP/MEDQ  D:  01/25/2006  T:  01/26/2006  Job:  [id number]   cc:   Dr. Hetty Ely

## 2010-06-12 NOTE — Assessment & Plan Note (Signed)
[facility]                            CARDIOLOGY OFFICE NOTE   Traci, Cook                      MRN:          [id number]  DATE:03/09/2006                            DOB:          07-Oct-1920    [name] is a delightful 77 year old patient referred by Dr.  Johna Cook and Dr. Hetty Cook for preop clearance.  She has had long-  standing anemia, it was thought to be pernicious in nature; however, she  subsequently had guaiac-positive stools, and was found to have a  question of a cancerous lesion in her colon.  She needs a colectomy.  This has tentatively been scheduled for February 27th, I believe.   The patient does not have a history of a significant cardiac problem.  She has not had any significant chest pain, PND, orthopnea; however, she  was hospitalized at the end of December for a question of congestive  heart failure.  At the time, she was profoundly anemic and received  blood transfusions.  There is a question of whether or not she actually  had congestive heart failure or whether her symptoms were simply from  profound anemia.  Chest x-ray dated January 24, 2006, on admission,  showed cardiomegaly with mild interstitial edema and small bilateral  effusions.   This is somewhat interesting that there was cardiomegaly on her chest x-  ray since the patient had an echocardiogram which was essentially normal  with an EF of 65%.   There were no significant valvular abnormalities.   In talking to the patient, she has done well over the last 6 weeks or  so.  She has not had any recurrent shortness of breath, PND, or  orthopnea.  There has been no lower extremity edema.  I do not have  recent blood counts on her.   In the hospital, she did have a mildly elevated BNP at 579.   Her baseline hemoglobin currently is running in the mid 20s.  Of note,  also, she had some renal failure which may be exacerbating her anemia.  When she was  admitted to the hospital, she had a BUN of 33 and a  creatinine of 1.3.  She since has had her diuretics and ACE inhibitors  stopped.   Her current medications include Lopressor 100 a day and iron.  Her  diuretics and ACE inhibitors have been stopped.   She, on review of systems, has had some anemia, which has had her be  cold intolerant.   Her mother died of a stroke, her father died of a stroke also.  The only  other surgery that she has had is gallbladder surgery.   She is retired.  She lives with one of her children.  She has multiple  grandchildren and great-grandchildren.  Her granddaughter was with her  today.  She does all her activities of daily living, but no longer  drives.   Her exam is remarkable for a blood pressure of 130/70, pulse 70 and  regular.  HEENT:  Normal.  Carotids are normal without bruit.  There is no JVP elevation.  She does have some rhonchi and wheezing in the left upper lobe.  There is an S1 and S2, normal heart sounds.  ABDOMEN:  Benign.  LOWER EXTREMITIES:  Intact pulse, no edema.   Baseline EKG shows sinus rhythm, nonspecific ST-T wave changes.   IMPRESSION:  Patient probably did have an episode of diastolic  congestive heart failure, as evidenced by her chest x-ray and BNP in the  500 range.  However, she has normal left ventricular function by echo.  Her EKG is essentially normal, and there is no history to suggest  coronary artery disease, however, she is 77 years old.  I think she  should have an adenosine Myoview study.  Will try to expedite this so we  can clear her for her surgery at the end of the month.   I would like to be called at the time of her surgery so we can follow  her postoperatively, given her recent episode of diastolic congestive  heart failure with anemia, she may very well have volume overload and go  into congestive heart failure postoperatively.  It may be worthwhile to  restart the patient's diuretic a day or two  prior to the surgery and  continue it afterwards, and watch her daily weights, and Is and Os  carefully.   So long as her adenosine Myoview is low risk, we will clear her for  surgery.  Since her lung exam is abnormal today, we will also check a  chest x-ray to make sure there is no pneumonia, and to make sure her  effusions and interstitial edema have cleared.     [name], MD, [facility]  Electronically Signed    PCN/MedQ  DD: 03/09/2006  DT: 03/09/2006  Job #: [id number]   cc:   [name], MD  Lorne Skeens. Hoxworth, M.D.

## 2010-06-12 NOTE — Op Note (Signed)
NAMESAREE, KROGH NO.:  [id number]   MEDICAL RECORD NO.:  [id number]          PATIENT TYPE:  INP   LOCATION:  2899                         FACILITY:  [facility]   PHYSICIAN:  Sharlet Salina T. Hoxworth, M.D.DATE OF BIRTH:  29-Aug-1920   DATE OF PROCEDURE:  03/23/2006  DATE OF DISCHARGE:                               OPERATIVE REPORT   PREOPERATIVE DIAGNOSIS:  Adenocarcinoma right colon.   POSTOPERATIVE DIAGNOSIS:  Adenocarcinoma right colon.   SURGICAL PROCEDURES:  Right hemicolectomy.   SURGEON:  Lorne Skeens. Hoxworth, M.D.   ASSISTANT:  Hyman Hopes. Carolynne Edouard M.D.   ANESTHESIA:  General.   BRIEF HISTORY:  [name] is an 77 year old female who recently  presented to the hospital with profound anemia and resolved congestive  heart failure.  She subsequently underwent an outpatient colonoscopy  which revealed an ulcerated mass in the ascending colon  and biopsy has  revealed moderately differentiated adenocarcinoma.  Subsequent MRI of  the abdomen has shown no evidence of metastatic disease.  We have  recommended proceeding with right hemicolectomy.  The nature of  procedure, indications, risks of bleeding, infection, anastomotic leak,  cardiorespiratory locations have been discussed and understood with the  patient and the family.  She is now brought to the operating room for  this procedure.   DESCRIPTION OF PROCEDURE:  The patient was brought to the operating  room, placed in the supine position on the operating table and general  endotracheal anesthesia was induced.  She had undergone a previous  mechanical antibiotic bowel prep at home.  Preoperative IV antibiotics  were given.  The abdomen was widely sterilely prepped and draped.  Orogastric tube and Foley catheter were placed.  Correct patient and  procedure were verified.  The patient had a previous open  cholecystectomy with a Kocher incision.  I elected to use a right mid  abdominal transverse incision.  This  was made and dissection carried  down through the subcutaneous tissue and fascial muscle layers using  cautery and the peritoneum entered under direct vision.  There were  omental adhesions in the anterior abdominal wall which were taken down  with cautery.  The abdomen was explored.  Could not really feel the  liver due to previous surgery.  There was a mass palpable in the  ascending colon but not particularly bulky.  There was no evidence of  adenopathy.  The right colon and terminal ileum were extensively  mobilized dividing lateral peritoneal attachments and bluntly dissecting  the mesentery up out of the retroperitoneum.  The LigaSure device was  used to come around the hepatic flexure through the hepatocolic  ligament.  The omentum was dissected up off of the proximal transverse  colon with the LigaSure.  The terminal ileum, cecum, right colon and  proximal transverse colon were completely mobilized.  The duodenum was  identified, dissected posteriorly and protected.  The right ureter was  identified and protected.  After full mobilization of the colon and  mesentery, we were able to definitely feel a mass in the proximal to mid  transverse colon.  Points of  resection of the terminal ileum and  transverse colon just proximal to middle colic vessels were chosen.  Each of these areas was cleaned of pericolic fat and mesentery.  The  mesentery was then divided with the LigaSure device.  The ileocolic  vessels were isolated and they were taken very near their origins above  the duodenum.  These vessels were clamped and ligated in addition to  division with the LigaSure.  After complete division of the mesentery, a  functional end-to-end anastomosis was created between the terminal ileum  and transverse colon with a single firing of the GIA 75 mm stapler.  The  staple line was intact without bleeding.  The common enterotomy was  closed and the specimen removed in a single firing of TA-60  stapler.  The mesentery was closed with interrupted silks.  The abdomen was  irrigated and hemostasis assured.  The viscera returned to the anatomic  position.  The gloves and instruments were changed.  The fascia was then  closed in layers with running 0 PDS.  Subcutaneous tissue was irrigated.  Skin closed with staples.  Sponge, needle and instrument counts were  correct.  Dry sterile dressings were applied and the patient taken to  the recovery room in good condition.      Lorne Skeens. Hoxworth, M.D.  Electronically Signed     BTH/MEDQ  D:  03/23/2006  T:  03/23/2006  Job:  [id number]

## 2010-06-12 NOTE — Letter (Signed)
February 15, 2006    [name], MD  [address] [place], [place] [address]   RE:  [name], [name]  MRN:  [id number]  /  DOB:  30-Sep-1920   Dear Dr. Hetty Ely.:   Upon your kind referral, I had the pleasure of evaluating your patient  and I am pleased to offer my findings.  I saw [name] in the office  today.  Enclosed is a copy of my progress note that details my findings  and recommendations.   Thank you for the opportunity to participate in your patient's care.    Sincerely,      Barbette Hair. Arlyce Dice, MD,FACG  Electronically Signed    RDK/MedQ  DD: 02/15/2006  DT: 02/15/2006  Job #: [id number]

## 2010-06-12 NOTE — Assessment & Plan Note (Signed)
[name]                         GASTROENTEROLOGY OFFICE NOTE   DEOSHA, [name]                      MRN:          [id number]  DATE:02/15/2006                            DOB:          [birth year]    [name] is an 77 year old white female referred through the  courtesy of Dr. Hetty Ely for evaluation.  History was provided by the  patient and by Dr. Lorenza Chick thorough referral note.  She has a  microcytic anemia.  She was hospitalized for anemia several weeks ago  and was transfused.  She apparently is B12 and iron deficient.  The  patient does report having passed black stools.  She complains of  dysphagia to solids.  She is without abdominal pain.  There is no  history of hematochezia or change of bowel habits.  She has lost weight  due to decreased appetite.   On February 10, 2006 her hemoglobin was 10.6, MCV was 78.5.  She is  taking supplemental iron.  Serum iron and folate and B12 levels were  also normal on February 10, 2006.  [name] is on no gastric  irritants including nonsteroidals, alcohol or aspirin.  She denies  pyrosis.   PAST MEDICAL HISTORY:  Significant for congestive heart failure.  She is  status post cholecystectomy.   FAMILY HISTORY:  Noncontributory.   MEDICATIONS:  Include Lasix, metoprolol, lisinopril, potassium, and  iron.   ALLERGIES:  She has no allergies.   SOCIAL HISTORY:  She neither smokes or drinks.  She is widowed and  retired.   REVIEW OF SYSTEMS:  Positive for frequent coughs, sleeping problems and  some shortness of breath.   PHYSICAL EXAMINATION:  On exam, pulse 60, blood pressure 80/50, weight  158.   PHYSICAL EXAMINATION:  HEENT: EOMI. PERRLA. Sclerae are anicteric.  Conjunctivae are pink.  NECK:  Supple without thyromegaly, adenopathy or carotid bruits.  CHEST:  Clear to auscultation and percussion without adventitious  sounds.  CARDIAC:  Regular rhythm; normal S1 S2.  There are no  murmurs, gallops  or rubs.  ABDOMEN:  Bowel sounds are normoactive.  Abdomen is soft, non-tender and  non-distended.  There are no abdominal masses, tenderness, splenic  enlargement or hepatomegaly.  EXTREMITIES:  Full range of motion.  No cyanosis, clubbing or edema.  RECTAL:  There are no masses.  Stool is Hemoccult negative.   IMPRESSION:  1. History of iron deficiency anemia and recent GI bleed.  Bleeding      sources include new polyps, _________ and neoplasm, inactive ulcers      should be ruled out.  2. Dysphagia -- rule out esophageal stricture, be it benign or      malignant.   RECOMMENDATION:  1. Colonoscopy.  2. Upper endoscopy.     Barbette Hair. Arlyce Dice, MD,FACG  Electronically Signed    RDK/MedQ  DD: 02/15/2006  DT: 02/15/2006  Job #: [id number]   cc:   [name], MD

## 2010-06-12 NOTE — Assessment & Plan Note (Signed)
[facility]                                 ON-CALL NOTE   OMNI, DUNSWORTH                      MRN:          [id number]  DATE:04/16/2006                            DOB:          03/08/1920    Time received is 7:46 p.m.  The patient is [name].  The caller is  August Saucer.  She sees Dr. Hetty Ely.  Date of birth December 10, 1920.  Telephone [phone].  Caller says that the patient has fever today  to 100.7 degrees.  She is quite nauseated, but has not vomited.  She is  extremely week, and unable to get out of bed without assistance.  Patient says that she has eaten very little food, and had very few  liquids for the past 3 weeks.  She is now quite concerned.  My response  is to take the patient to the emergency room now.     Tera Mater. Clent Ridges, MD  Electronically Signed    SAF/MedQ  DD: 04/17/2006  DT: 04/17/2006  Job #: [id number]

## 2010-06-12 NOTE — Letter (Signed)
March 04, 2006    [name]. Eden Emms, MD, [facility]  [address]  [address]  [place], [place] [address]   RE:  Traci, Cook  MRN:  [id number]  /  DOB:  1920/08/15   Dear Dr. Eden Emms,   This is to introduce [name], a very nice 77 -year-old white  female who requires surgical clearance for a partial colectomy due to  adenocarcinoma of the colon at the splenic flexure.  The patient has  been reasonably healthy until recently when she has had a more intense  anemia which eventuated in a colonoscopy finding to be adenocarcinoma.  Prior to that she has had hypertension since 2000 and elevated TSH since  1993 that has stabilized.  She has elevated cholesterol since December  of 2004.  Most recent cholesterol profile in December of 2007 showed her  total cholesterol 159, HDL 52, LDL 93, and her triglycerides at 69, on  no cholesterol medicines.  She also has CHF since December 2007 at which  time she was hospitalized for the anemia and CHF.  Was put on Lasix at  that time which she tolerated reasonably well but stopped that this  month due to over diuresing and a significant increase in BUN at Dr.  Jamse Mead office.  Her anemia has always been felt to be pernicious,  otherwise she has no other medical problems.  SHE HAS NO ALLERGIES.  Her  first hospitalization recently was in December for the anemia and CHF.  An MRI of the abdomen showed bilateral renal cysts but no metastases  from her colon cancer.   MEDICATIONS:  1. Metoprolol 100 mg b.i.d.  2. K-Dur 20 mEq in the morning.  3. Lisinopril 10 mg a day.  4. Ferrous sulfate 324 mg t.i.d.   Her Lasix 20 mg in the morning was stopped on February 5, due to over  diuresis.   Her BUN and creatinine has not been rechecked as of this time.  She  really has no coronary disease and no heart complaints, but because of  the significance and intensity of the surgery she is about to undergo I  thought she ought to be seen.  I appreciate your  seeing her, I look  forward to your evaluation.    Sincerely,      [name], MD  Electronically Signed    RNS/MedQ  DD: 03/04/2006  DT: 03/04/2006  Job #: [id number]

## 2010-06-12 NOTE — Discharge Summary (Signed)
[name], [name]               ACCOUNT NO.:  [id number]   MEDICAL RECORD NO.:  [id number]          PATIENT TYPE:  INP   LOCATION:  5729                         FACILITY:  [facility]   PHYSICIAN:  Valerie A. Felicity Coyer, MDDATE OF BIRTH:  [birth year]   DATE OF ADMISSION:  04/16/2006  DATE OF DISCHARGE:  04/20/2006                               DISCHARGE SUMMARY   DISCHARGE DIAGNOSES:  1. Failure to thrive, multifactorial.  2. Anorexia/nausea.  3. Hypoalbuminemia, question secondary to malnutrition versus      secondary to right hemicolectomy.  4. Hypotension on admission.  5. Mild hypokalemia.  6. Diastolic dysfunction.   HISTORY OF PRESENT ILLNESS:  [name] is an 77 year old white female  who was admitted on April 16, 2006 with chief complaint of progressive  weakness and nausea.  The family reported that she had been noted to  have a very poor appetite and nausea with very little to eat over the  week prior to this admission.  She was also noted to have some dizziness  upon standing and to be unsteady on her feet.  She noted on admission  possibly a strong odor to her urine.  She was admitted for further  evaluation and treatment.   PAST MEDICAL HISTORY:  1. Anemia.  2. B12 deficiency.  3. Adenocarcinoma of the right colon status post right hemicolectomy      March 23, 2006.  4. History of CHF.   COURSE OF HOSPITALIZATION:  1. Failure to thrive, multifactorial.  The patient was seen by      physical therapy and occupational therapy during this admission,      and it was felt that the patient was appropriate for discharge to      home with home health physical therapy and occupational therapy      along with 24-hour supervision.  2. Mild hypotension on admission.  The patient was given IV hydration,      and her beta blocker dose was decreased.  Her blood pressure has      stabilized appropriately.  The patient did undergo a 2-D      echocardiogram performed during this  admission which noted      diastolic dysfunction and a normal left ventricular ejection      fraction of 60-65%.  A.m. cortisol level was 24, and random      cortisol level was 38.  Patient is noted to have some mild lower      extremity edema and had apparently been taking Lasix at some point      prior to this admission.  Will defer to the patient's primary care      resuming Lasix.  3. Anorexia/nausea.  The patient is currently tolerating p.o.  We will      add Megace for appetite stimulation.  If the patient continues with      issues in the way of anorexia, consider an outpatient GI.  4. Mild hypokalemia.  The patient was given potassium repletion, and      the patient's potassium level rose to 4.1.   MEDICATIONS AT  TIME OF DISCHARGE:  1. Metoprolol 50 mg p.o. b.i.d.  The patient is instructed to cut 100-      mg tablets in half.  2. Zofran 4 mg as needed every 8 hours.  3. Megace suspension 40 mg per mL 2 teaspoons once daily for appetite.   PERTINENT LABORATORY DATA:  At time of discharge, hemoglobin 11.1, BUN  15, creatinine 0.83.   DISPOSITION:  The patient will be discharged to home.  We will ask home  health physical therapy and occupational therapy to follow up with the  patient at home for home health.  We have scheduled outpatient follow-up  with Dr. Laurita Quint on Friday, March 28 at 12:30 p.m.  The patient  is instructed to call Dr. Hetty Ely or return to the ER if unable to  tolerate food or liquid.      Sandford Craze, NP      Raenette Rover. Felicity Coyer, MD  Electronically Signed    MO/MEDQ  D:  04/20/2006  T:  04/20/2006  Job:  [id number]   cc:   [name], MD

## 2010-06-12 NOTE — H&P (Signed)
[name], [name]               ACCOUNT NO.:  [id number]   MEDICAL RECORD NO.:  [id number]          PATIENT TYPE:  INP   LOCATION:  5729                         FACILITY:  [facility]   PHYSICIAN:  Barbette Hair. Artist Pais, DO      DATE OF BIRTH:  04/03/1920   DATE OF ADMISSION:  04/16/2006  DATE OF DISCHARGE:                              HISTORY & PHYSICAL   PRIMARY CARE PHYSICIAN:  Laurita Quint.   CHIEF COMPLAINT:  1. Hypotension.  2. Failure to thrive.  3. Chronic nodule.   HISTORY OF PRESENT ILLNESS:  The patient is an 77 year old white female  with a past medical history of anemia, hypertension and congestive heart  failure who recently underwent right hemicolectomy March 23, 2006,  who presents with progressive weakness and nausea.  The patient lives  with one of her children and the  granddaughter had noted very poor  appetite and nausea that was exacerbated by food intake.  There was no  report of any vomiting.  She has had very little to eat and drink over  the last 1 week time period.  She has been getting progressively weaker  and notes dizziness with standing.  She is noted to be unsteady on her  feet.   She has had poor response to Phenergan and Zofran as an outpatient.  She  denies any recent chest discomfort, no shortness of breath.  She notes a  mild cough, no dysuria, possibly a strong odor to her urine.   PAST MEDICAL HISTORY SUMMARY:  1. Anemia.  2. B12 deficiency.  3. Adenocarcinoma of the right colon status post right hemicolectomy.  4. History of congestive heart failure.   SOCIAL HISTORY:  Patient is retired, lives with one of her children.  No  alcohol, no tobacco.   FAMILY HISTORY:  Positive for CVA.   CURRENT MEDICATIONS:  1. Metoprolol 100 mg b.i.d.  2. Phenergan and Zofran p.r.n.  3. She has recently stopped furosemide and ACE inhibitor.   LABORATORY DATA:  A BMET showed sodium 137, potassium 3, chloride 104,  bicarbonate 19, creatinine 1.3 and  blood sugar 101.  A CBC showed WBC  6.3, H&H of 10.5/31 and platelets of 157.  An ISTAT showed a pH of  7.498, bicarbonate of 27.4 and pCO2 venous of 35.3.   A UA was positive for small leukocyte esterase.   Chart reviewed.  A 2D echo December of 2007 showed normal systolic  function, no wall motion abnormalities.   An MRI of abdomen February, 2008, no definite metastatic disease in the  abdomen.   PHYSICAL EXAM:  VITALS:  Temperature 97.1, blood pressure is 87/54,  pulse is 68, respirations 14.  The patient is 94% on room air.  GENERAL:  The patient is a frail 77 year old white female, lethargic,  but responds to questions and follows commands appropriately.  HEENT:  Normocephalic, atraumatic.  Pupils are equal and reactive to  light bilaterally.  Extraocular motility was intact.  Patient was  anicteric.  Conjunctiva was pale.  Mucous membranes were moist.  NECK:  Supple, no adenopathy.  CHEST:  Clear to auscultation bilaterally.  No rhonchi, rubs or  wheezing.  CARDIOVASCULAR:  Regular rate and rhythm.  No significant murmurs, rubs  or gallops appreciated.  ABDOMEN:  Soft, nontender, positive bowel sounds, no organomegaly.  EXTREMITIES:  No clubbing, cyanosis or edema.  SKIN:  Increased skin turgor and neurologically cranial nerves II-XII  were grossly intact, she was nonfocal.  She had no pronator drift, no  cerebellar signs.   REVIEW OF SYSTEMS:  As noted above.  All other systems negative.   IMPRESSION RECOMMENDATIONS:  1. Hypotension/failure to thrive.  2. Nausea, unclear etiology.  3. Possible urinary tract infection.  4. Status post right hemicolectomy for adenocarcinoma of ascending      colon.  5. Anemia.  6. Hypokalemia.   RECOMMENDATIONS:  Patient will be admitted for IV fluids and workup.  We  will repeat 2D echo, check TSH, B12, folate and perform a Cosyntropin  stim test for possibility of adrenal insufficiency.   We will consult GI for evaluation of  nausea, as she may need repeat EGD.  We will empirically start Protonix 40 mg once a day.   In terms of her blood pressure, we will decrease her metoprolol to 50 mg  p.o. b.i.d.  If there is no obvious source of her nausea, we will  consider the possibility of depression.      Barbette Hair. Artist Pais, DO  Electronically Signed     RDY/MEDQ  D:  04/17/2006  T:  04/17/2006  Job:  [id number]   cc:   [name], MD

## 2010-06-16 ENCOUNTER — Encounter: Payer: Self-pay | Admitting: Gastroenterology

## 2010-06-16 ENCOUNTER — Ambulatory Visit (INDEPENDENT_AMBULATORY_CARE_PROVIDER_SITE_OTHER): Payer: Medicare Other | Admitting: Gastroenterology

## 2010-06-16 DIAGNOSIS — Z8500 Personal history of malignant neoplasm of unspecified digestive organ: Secondary | ICD-10-CM | POA: Insufficient documentation

## 2010-06-16 MED ORDER — PEG-KCL-NACL-NASULF-NA ASC-C 100 G PO SOLR: 1.0000 | Freq: Once | ORAL | Status: AC

## 2010-06-16 NOTE — Assessment & Plan Note (Signed)
Plan followup colonoscopy 

## 2010-06-16 NOTE — Patient Instructions (Signed)
Colonoscopy A colonoscopy is an exam to evaluate your entire colon. In this exam, your colon is cleansed. A long fiberoptic tube is inserted through your rectum and into your colon. The fiberoptic scope (endoscope) is a long bundle of enclosed and very flexible fibers. These fibers transmit light to the area examined and send images from that area to your caregiver. Discomfort is usually minimal. You may be given a drug to help you sleep (sedative) during or prior to the procedure. This exam helps to detect lumps (tumors), polyps, inflammation, and areas of bleeding. Your caregiver may also take a small piece of tissue (biopsy) that will be examined under a microscope. BEFORE THE PROCEDURE  A clear liquid diet may be required for 2 days before the exam.   Liquid injections (enemas) or laxatives may be required.   A large amount of electrolyte solution may be given to you to drink over a short period of time. This solution is used to clean out your colon.   You should be present 1 prior to your procedure or as directed by your caregiver.   Check in at the admissions desk to fill out necessary forms if not preregistered. There will be consent forms to sign prior to the procedure. If accompanied by friends or family, there is a waiting area for them while you are having your procedure.  LET YOUR CAREGIVER KNOW ABOUT:  Allergies to food or medicine.  Medicines taken, including vitamins, herbs, eyedrops, over-the-counter medicines, and creams.   Use of steroids (by mouth or creams).   Previous problems with anesthetics or numbing medicines.   History of bleeding problems or blood clots.  Previous surgery.   Other health problems, including diabetes and kidney problems.   Possibility of pregnancy, if this applies.   AFTER THE PROCEDURE  If you received a sedative and/or pain medicine, you will need to arrange for someone to drive you home.   Occasionally, there is a little blood passed  with the first bowel movement. DO NOT be concerned.  HOME CARE INSTRUCTIONS  It is not unusual to pass moderate amounts of gas and experience mild abdominal cramping following the procedure. This is due to air being used to inflate your colon during the exam. Walking or a warm pack on your belly (abdomen) may help.   You may resume all normal meals and activities after sedatives and medicines have worn off.   Only take over-the-counter or prescription medicines for pain, discomfort, or fever as directed by your caregiver. DO NOT use aspirin or blood thinners if a biopsy was taken. Consult your caregiver for medicine usage if biopsies were taken.  FINDING OUT THE RESULTS OF YOUR TEST Not all test results are available during your visit. If your test results are not back during the visit, make an appointment with your caregiver to find out the results. Do not assume everything is normal if you have not heard from your caregiver or the medical facility. It is important for you to follow up on all of your test results. SEEK IMMEDIATE MEDICAL CARE IF:  You pass large blood clots or fill a toilet with blood following the procedure. This may also occur 10 to 14 days following the procedure. This is more likely if a biopsy was taken.   You develop abdominal pain that keeps getting worse and cannot be relieved with medicine.  Document Released: 01/09/2000 Document Re-Released: 04/07/2009 [facility] Patient Information 2011 [place], [place]. Your Colonoscopy is scheduled on 06/19/2010 at  2:30pm We are sending in your MoviPrep to your pharmacy today

## 2010-06-16 NOTE — Progress Notes (Signed)
History of Present Illness:  The patient is an 77 year old white female with a history of colon cancer, status post resection in 2008, referred for followup colonoscopy.  Last examination in 2009 was unremarkable. She has no GI complaints including change in bowel habits,abdominal pain, melena or hematochezia.    Review of Systems: Pertinent positive and negative review of systems were noted in the above HPI section. All other review of systems were otherwise negative.    Current Medications, Allergies, Past Medical History, Past Surgical History, Family History and Social History were reviewed in [employer] electronic medical record  Vital signs were reviewed in today's medical record. Physical Exam: General: Well developed , well nourished, no acute distress Head: Normocephalic and atraumatic Eyes:  sclerae anicteric, EOMI Ears: Normal auditory acuity Mouth: No deformity or lesions Lungs: Clear throughout to auscultation Heart: Regular rate and rhythm; no murmurs, rubs or bruits Abdomen: Soft, non tender and non distended. No masses, hepatosplenomegaly or hernias noted. Normal Bowel sounds Rectal:deferred Musculoskeletal: Symmetrical with no gross deformities  Pulses:  Normal pulses noted Extremities: No clubbing, cyanosis, edema or deformities noted Neurological: Alert oriented x 4, grossly nonfocal Psychological:  Alert and cooperative. Normal mood and affect

## 2010-06-17 ENCOUNTER — Encounter: Payer: Self-pay | Admitting: Gastroenterology

## 2010-06-19 ENCOUNTER — Encounter: Payer: Self-pay | Admitting: Gastroenterology

## 2010-06-19 ENCOUNTER — Ambulatory Visit (AMBULATORY_SURGERY_CENTER): Payer: Medicare Other | Admitting: Gastroenterology

## 2010-06-19 DIAGNOSIS — K648 Other hemorrhoids: Secondary | ICD-10-CM

## 2010-06-19 DIAGNOSIS — Z85038 Personal history of other malignant neoplasm of large intestine: Secondary | ICD-10-CM

## 2010-06-19 DIAGNOSIS — D126 Benign neoplasm of colon, unspecified: Secondary | ICD-10-CM

## 2010-06-19 DIAGNOSIS — Z8500 Personal history of malignant neoplasm of unspecified digestive organ: Secondary | ICD-10-CM

## 2010-06-19 DIAGNOSIS — Z1211 Encounter for screening for malignant neoplasm of colon: Secondary | ICD-10-CM

## 2010-06-19 MED ORDER — SODIUM CHLORIDE 0.9 % IV SOLN: 500.0000 mL | INTRAVENOUS | Status: DC

## 2010-06-19 NOTE — Patient Instructions (Signed)
Please review discharge instructions Return to the care of your primary doctor, call Dr. Arlyce Dice as needed

## 2010-06-23 ENCOUNTER — Telehealth: Payer: Self-pay | Admitting: *Deleted

## 2010-06-23 NOTE — Telephone Encounter (Signed)

## 2010-06-24 ENCOUNTER — Other Ambulatory Visit: Payer: Self-pay | Admitting: Family Medicine

## 2010-06-26 ENCOUNTER — Inpatient Hospital Stay (HOSPITAL_COMMUNITY)
Admission: EM | Admit: 2010-06-26 | Discharge: 2010-06-29 | DRG: 155 | Disposition: A | Discharge status: Home Health | Payer: Medicare Other | Attending: Internal Medicine | Admitting: Internal Medicine

## 2010-06-26 ENCOUNTER — Telehealth: Payer: Self-pay | Admitting: *Deleted

## 2010-06-26 ENCOUNTER — Emergency Department (HOSPITAL_COMMUNITY): Payer: Medicare Other

## 2010-06-26 DIAGNOSIS — E860 Dehydration: Secondary | ICD-10-CM | POA: Diagnosis present

## 2010-06-26 DIAGNOSIS — E785 Hyperlipidemia, unspecified: Secondary | ICD-10-CM | POA: Diagnosis present

## 2010-06-26 DIAGNOSIS — E119 Type 2 diabetes mellitus without complications: Secondary | ICD-10-CM | POA: Diagnosis present

## 2010-06-26 DIAGNOSIS — I129 Hypertensive chronic kidney disease with stage 1 through stage 4 chronic kidney disease, or unspecified chronic kidney disease: Secondary | ICD-10-CM | POA: Diagnosis present

## 2010-06-26 DIAGNOSIS — K0530 Chronic periodontitis, unspecified: Secondary | ICD-10-CM | POA: Diagnosis present

## 2010-06-26 DIAGNOSIS — K1120 Sialoadenitis, unspecified: Principal | ICD-10-CM | POA: Diagnosis present

## 2010-06-26 DIAGNOSIS — D509 Iron deficiency anemia, unspecified: Secondary | ICD-10-CM | POA: Diagnosis present

## 2010-06-26 DIAGNOSIS — K0510 Chronic gingivitis, plaque induced: Secondary | ICD-10-CM | POA: Diagnosis present

## 2010-06-26 DIAGNOSIS — N179 Acute kidney failure, unspecified: Secondary | ICD-10-CM | POA: Diagnosis present

## 2010-06-26 DIAGNOSIS — N182 Chronic kidney disease, stage 2 (mild): Secondary | ICD-10-CM | POA: Diagnosis present

## 2010-06-26 LAB — POCT I-STAT, CHEM 8
BUN: 23 mg/dL (ref 6–23)
Calcium, Ion: 1.13 mmol/L (ref 1.12–1.32)
Chloride: 104 mEq/L (ref 96–112)
Creatinine, Ser: 1.9 mg/dL — ABNORMAL HIGH (ref 0.4–1.2)
Glucose, Bld: 124 mg/dL — ABNORMAL HIGH (ref 70–99)
HCT: 33 % — ABNORMAL LOW (ref 36.0–46.0)
Potassium: 3.9 mEq/L (ref 3.5–5.1)

## 2010-06-26 LAB — CBC
HCT: 33.5 % — ABNORMAL LOW (ref 36.0–46.0)
MCH: 31.1 pg (ref 26.0–34.0)
MCV: 91.3 fL (ref 78.0–100.0)
Platelets: 163 10*3/uL (ref 150–400)
RDW: 12.8 % (ref 11.5–15.5)

## 2010-06-26 LAB — DIFFERENTIAL
Eosinophils Absolute: 0.1 10*3/uL (ref 0.0–0.7)
Eosinophils Relative: 1 % (ref 0–5)
Lymphocytes Relative: 11 % — ABNORMAL LOW (ref 12–46)
Lymphs Abs: 1.1 10*3/uL (ref 0.7–4.0)
Monocytes Absolute: 1 10*3/uL (ref 0.1–1.0)
Monocytes Relative: 10 % (ref 3–12)

## 2010-06-26 NOTE — Telephone Encounter (Signed)
Patient's daughter called regarding mom. She says that patient had a knot on her neck that has moved up to her chin. She says that it is causing her a lot of pain and trouble swallowing. I advised her to go to ER.

## 2010-06-26 NOTE — Telephone Encounter (Signed)
Agree  Thank you

## 2010-06-27 LAB — LIPID PANEL
HDL: 58 mg/dL (ref >39)
LDL Cholesterol: 63 mg/dL (ref 0–99)

## 2010-06-27 LAB — PHOSPHORUS: Phosphorus: 2.9 mg/dL (ref 2.3–4.6)

## 2010-06-27 LAB — BASIC METABOLIC PANEL
CO2: 25 mEq/L (ref 19–32)
Calcium: 8.7 mg/dL (ref 8.4–10.5)
Chloride: 104 mEq/L (ref 96–112)
Glucose, Bld: 83 mg/dL (ref 70–99)
Potassium: 3.3 mEq/L — ABNORMAL LOW (ref 3.5–5.1)
Sodium: 138 mEq/L (ref 135–145)

## 2010-06-27 LAB — CBC
HCT: 28.3 % — ABNORMAL LOW (ref 36.0–46.0)
Hemoglobin: 9.7 g/dL — ABNORMAL LOW (ref 12.0–15.0)
MCHC: 34.3 g/dL (ref 30.0–36.0)

## 2010-06-27 LAB — GLUCOSE, CAPILLARY
Glucose-Capillary: 151 mg/dL — ABNORMAL HIGH (ref 70–99)
Glucose-Capillary: 99 mg/dL (ref 70–99)
Glucose-Capillary: 93 mg/dL (ref 70–99)

## 2010-06-27 LAB — COMPREHENSIVE METABOLIC PANEL
Albumin: 2.7 g/dL — ABNORMAL LOW (ref 3.5–5.2)
Alkaline Phosphatase: 54 U/L (ref 39–117)
BUN: 22 mg/dL (ref 6–23)
Creatinine, Ser: 1.58 mg/dL — ABNORMAL HIGH (ref 0.4–1.2)
Glucose, Bld: 110 mg/dL — ABNORMAL HIGH (ref 70–99)
Potassium: 3.6 mEq/L (ref 3.5–5.1)
Total Bilirubin: 0.6 mg/dL (ref 0.3–1.2)
Total Protein: 6 g/dL (ref 6.0–8.3)

## 2010-06-27 LAB — TSH: TSH: 6.036 u[IU]/mL — ABNORMAL HIGH (ref 0.350–4.500)

## 2010-06-27 LAB — T4, FREE: Free T4: 0.83 ng/dL (ref 0.80–1.80)

## 2010-06-27 NOTE — H&P (Signed)
NAMESASHIA, CAMPAS NO.:  [id number]  MEDICAL RECORD NO.:  [id number]           PATIENT TYPE:  E  LOCATION:  [facility]                         FACILITY:  [facility]  PHYSICIAN:  Rosanna Randy, MDDATE OF BIRTH:  1920/04/12  DATE OF ADMISSION:  06/26/2010 DATE OF DISCHARGE:                             HISTORY & PHYSICAL   CHIEF COMPLAINT:  Chills, difficulty chewing and swallowing, swelling and erythema of her mandible area.  The patient is going to be admitted and followed by [facility].  HISTORY OF PRESENT ILLNESS:  Ms. Hoglund is an 77 year old female with a past medical history significant for hypertension, diabetes that is controlled just with diet, and also history of colon cancer status post right hemicolectomy in 2008 who was sent from the urgent care to [facility] Emergency Department secondary to swelling and redness under her chin having been going on for the last 3 days and has just worsened this morning.  The patient reports that for the last 3 days she had been noticing some pain under her chin and also some redness.  The patient reports associated chills and this morning significant swelling and redness that was making her unable to open her mouth, chew, eat, or drink properly, main reason why the patient comes into the hospital for further evaluation and treatment.  The patient denies any shortness of breath, nausea, vomiting, unusual food, and there is no trauma to the affected area.  In the emergency department, the patient was unable to have any CT scan with contrast to evaluate what appears to be acute parotitis secondary to elevation in her creatinine.  Looking into her history, the patient has chronic renal failure which is a stage II at baseline that has been worsened looking into her lab work done on the day of admission.  The patient is otherwise asymptomatic.  ALLERGIES:  No known drug allergies.  PAST  MEDICAL HISTORY:  Significant for colon  post right hemicolectomy in 2008, hypertension, iron deficiency anemia, history of pernicious anemia with a vitamin B12 deficiency.  The patient has history of abnormal TSH have not been never on medications, history of diabetes currently controlled with diet, history of hyperlipidemia.  The patient has a history of CHF, diastolic in nature with an ejection fraction of 60% to 65% last 2-D echo done in March 2008.  The patient also has a history of vitamin D deficiency and a history of chronic renal failure stage II.  MEDICATIONS:  Current home medications include: 1. Lisinopril 5 mg by mouth daily. 2. Lasix 20 mg by mouth daily. 3. Vitamin B12 one tablet by mouth daily. 4. Vitamin D3 1000 International Units 1 tablet by mouth twice a day. 5. Iron 65 mg over the counter 1 tablet twice a day. 6. Amlodipine 5 mg 1 tablet daily at bedtime.  SOCIAL HISTORY:  The patient is retired.  She is widowed since 1996. Lives with her youngest daughter and the daughter's husband.  She denies any smoking, alcohol, or illicit drugs.  FAMILY HISTORY:  Father died at age of 59 secondary to blocked bowels.  Her mother died secondary to brain hemorrhage.  One brother died when he was 31 secondary to gastric ulcers due to alcohol abuse.  She had a sister who passed away secondary to a stroke and had a sister who passed away that had history of diabetes and hypertension.  REVIEW OF SYSTEMS:  The patient denies any fatigue, any sweats, weakness, or any changes in her weight.  The patient denies any blurring vision, any discharges, or any eye pain.  The patient denies any shortness of breath with exertion.  Denies any chest pain or discomfort. She denies fainting or palpitations.  There is a positive feet swelling on and off.  Denies any cough.  Denies wheezing.  There is no abdominal pain, bloody stools, or changes in her bowel habits.  Other pertinent positives as  already mentioned on the patient's HPI.  PHYSICAL EXAMINATION:  VITALS:  Temperature 101.6, heart rate 111, respiratory rate 18, blood pressure 141/62, these are the vital signs on admission at the moment that I examined the patient and she received Tylenol, temperature was 99.6, heart rate was 86, respiratory rate was 18, blood pressure was 100/58, and her oxygen saturation was 95% on room air. GENERAL:  The patient was lying in bed no acute distress.  She was really warm to touch, alert, awake, and oriented x3.  Able to communicate and follow commands properly. HEENT:  No icterus.  Her head, no trauma appreciated, normocephalic.  No nystagmus.  PERRLA.  Extraocular muscles intact.  On her mandible area, the patient had enlarged nodes that appears to be her parotid glands enlarged centrally and left side of her face with some erythema and tenderness to palpation.  Inside her mouth, there is no erythema, no exudates, no signs of candidiasis or any other infection.  There are multiple cavities and also positive for halitosis. NECK:  Supple.  No thyromegaly. RESPIRATORY:  Clear to auscultation bilaterally.  There was no wheezing. No rhonchi.  No crackles. HEART:  Regular rate and rhythm.  No murmurs.  No gallops. ABDOMEN:  Soft, nontender, nondistended.  Positive bowel sounds. EXTREMITIES:  There is bilateral trace edema to 1+ edema on her legs all the way from her feet to mid shin. SKIN:  Apart from the erythema and the swollen area of her face, there are no other abnormalities appreciated. LYMPH:  No lymphadenopathy. NEUROLOGIC:  The patient was alert, awake, and oriented x3.  Cranial nerves II through XII grossly intact.  Muscle strength 5/5 bilaterally symmetrically.  Normal finger-to-nose.  The patient was able to follow commands appropriately.  PERTINENT LABORATORY DATA:  At the moment of this dictation, the patient had a CBC with differential that demonstrated a white blood  cells of 11.0, hemoglobin 11.4, MCV 91.3, platelets 163.  The patient has absolute neutrophils count of 8.6.  There is also an iSTAT chemistry that demonstrated a sodium of 137, potassium 3.9, chloride 104, sugar of 124, BUN 23, creatinine 1.90, ionized calcium 1.13, bicarb 23.  ASSESSMENT AND PLAN: 1. Sialodenitis.  At this moment due to the patient's renal     function, we are going to be unable to perform a CT scan to look     into more details of this infection/inflammation going on into the     parotid glands.  We are going to provide fluid resuscitation.  We     are going to put the patient on a clear liquid/full liquid diet to     decrease her mastication and  also to decrease the salivation.  The     patient is going to be started empirically on clindamycin and also     ciprofloxacin IV and we are going to apply warm compresses to the     affected area.  In the morning, we are going to repeat the     patient's CBC in order to track the patient's white blood cells and     we are going to also to check the patient's creatinine function.     If the creatinine function has improved, we are going to perform a     CT scan.  Depending on the findings, we are going to involve ENT if     necessary.  An other test that could potentially be done depending     evolution of this patient is going to be an ultrasound of the     mandible in order to look for any abscess. 2. Acute on chronic renal failure.  The patient chronically with a     stage II kidney disease that appears to be secondary to her     hypertension.  We are going to provide fluid resuscitation and we     are going to hold Lasix and we are going also to hold her     lisinopril.  We are going to follow the patient's creatinine trend. 3. Hypertension.  We are going to continue at this moment just her     amlodipine and we are going to adjust the dose of this medication     as needed.  Once her kidney function improved, we are  going to     restart the rest of her medications. 4. Iron deficiency anemia.  We are going to continue her iron pills. 5. History of pernicious anemia with B12 deficiency.  We are going to     continue vitamin B12. 6. Abnormal TSH.  We are going to check a TSH level, and depending     results we are going to determine need of medications.  We are     going to also to check a free T4 level. 7. Diabetes, currently controlled just with diet.  We are going to     check hemoglobin A1c and determine depending on the results if the     patient will require any medications while she is in the hospital,     and in front of this infection we are going to use a sensitive     sliding scale with CBGs checkup q.a.c. and bedtime coverage. 8. Hyperlipidemia.  We are going to check a fasting lipid profile.     The patient currently is not taking any medications.  Depending on     results, we are going to provide recommendations to primary care     physician to start statins if needed. 9. If the patient's congestive heart failure is diastolic in nature,     we will compensate at this point.  Physical exam actually showing     mild dehydration, we are going to hold on Lasix for now.  We are     going to provide gentle hydration.  Plan is to restart home     medications once the patient's creatinine improves.  At this     moment, the patient is not complaining of any chest pain.  There is     no JVD and no need of any 2-D echo. 10.Vitamin D deficiency.  Plan is to  continue using current vitamin D     supplementation. 11.Deep vein thrombosis prophylaxis.  We are going to start the     patient on heparin 5000 units q.8 h.     Rosanna Randy, MD     CEM/MEDQ  D:  06/26/2010  T:  06/26/2010  Job:  [id number]  cc:   [name], MD  Electronically Signed by [name] MD on 06/27/2010 08:54:41 AM

## 2010-06-28 LAB — DIFFERENTIAL
Eosinophils Relative: 2 % (ref 0–5)
Lymphocytes Relative: 18 % (ref 12–46)
Lymphs Abs: 1.1 10*3/uL (ref 0.7–4.0)
Monocytes Absolute: 0.8 10*3/uL (ref 0.1–1.0)

## 2010-06-28 LAB — CBC
HCT: 26.6 % — ABNORMAL LOW (ref 36.0–46.0)
MCHC: 34.2 g/dL (ref 30.0–36.0)
MCV: 91.1 fL (ref 78.0–100.0)
RDW: 12.6 % (ref 11.5–15.5)

## 2010-06-28 LAB — GLUCOSE, CAPILLARY
Glucose-Capillary: 78 mg/dL (ref 70–99)
Glucose-Capillary: 93 mg/dL (ref 70–99)

## 2010-06-28 LAB — COMPREHENSIVE METABOLIC PANEL
Alkaline Phosphatase: 45 U/L (ref 39–117)
BUN: 12 mg/dL (ref 6–23)
CO2: 24 mEq/L (ref 19–32)
Calcium: 8.4 mg/dL (ref 8.4–10.5)
GFR calc non Af Amer: 37 mL/min — ABNORMAL LOW (ref >60)
Glucose, Bld: 104 mg/dL — ABNORMAL HIGH (ref 70–99)
Potassium: 3.6 mEq/L (ref 3.5–5.1)
Total Protein: 5 g/dL — ABNORMAL LOW (ref 6.0–8.3)

## 2010-06-28 LAB — MAGNESIUM: Magnesium: 1.7 mg/dL (ref 1.5–2.5)

## 2010-06-29 LAB — COMPREHENSIVE METABOLIC PANEL
ALT: 9 U/L (ref 0–35)
AST: 10 U/L (ref 0–37)
Albumin: 2.2 g/dL — ABNORMAL LOW (ref 3.5–5.2)
Alkaline Phosphatase: 45 U/L (ref 39–117)
BUN: 9 mg/dL (ref 6–23)
Chloride: 109 mEq/L (ref 96–112)
GFR calc Af Amer: 53 mL/min — ABNORMAL LOW (ref >60)
Potassium: 3.9 mEq/L (ref 3.5–5.1)
Sodium: 139 mEq/L (ref 135–145)
Total Bilirubin: 0.3 mg/dL (ref 0.3–1.2)
Total Protein: 5.3 g/dL — ABNORMAL LOW (ref 6.0–8.3)

## 2010-06-29 LAB — CBC
MCV: 91.3 fL (ref 78.0–100.0)
Platelets: 153 10*3/uL (ref 150–400)
RBC: 3.09 MIL/uL — ABNORMAL LOW (ref 3.87–5.11)
RDW: 12.9 % (ref 11.5–15.5)
WBC: 4.3 10*3/uL (ref 4.0–10.5)

## 2010-06-29 NOTE — Discharge Summary (Signed)
[name], [name] NO.:  [id number]  MEDICAL RECORD NO.:  [id number]  LOCATION:  6737                         FACILITY:  [facility]  PHYSICIAN:  Talmage Nap, MD  DATE OF BIRTH:  03-13-1920  DATE OF ADMISSION:  06/26/2010 DATE OF DISCHARGE:  06/29/2010                        DISCHARGE SUMMARY - REFERRING   PRIMARY CARE PHYSICIAN:  [name], MD  DISCHARGE DIAGNOSES: 1. Left molar tooth periodontitis/gingivitis. 2. Hypertension. 3. Anemia. 4. History of congestive heart failure (chronic diastolic     dysfunction). 5. Diabetes mellitus -- dietarily controlled. 6. Hyperlipidemia. 7. History of vitamin B12 deficiency. 8. History of pernicious anemia.  HISTORY OF PRESENT ILLNESS:  The patient is an 77 year old Caucasian female who was admitted to the hospital on June 26, 2010 by Dr. Vassie Cook with complaints of dysphagia and swelling of the mandibles of about 3 days duration.  This was said to be associated with chills. There was however no history of nausea.  There was no history of vomiting.  The patient was said to be having difficulty in opening her mouth, and there was no history of chest pain or shortness of breath. Symptoms were said to be getting progressively worse, hence the patient presented to the emergency room to be evaluated.  PREADMISSION MEDICATIONS:  Her preadmission meds includes; 1. Lisinopril 5 mg p.o. daily. 2. Lasix 20 mg p.o. daily. 3. Vitamin B12 1 tablet p.o. daily. 4. Vitamin D3 1000 international units 1 tablet p.o. b.i.d. 5. Iron 375 mg over-the-counter one p.o. b.i.d. 6. Amlodipine 5 mg one p.o. daily at bedtime.  ALLERGIES:  She had no known allergies.  PAST SURGICAL HISTORY:  Right hemicolectomy, indication is unknown.  FAMILY HISTORY:  York Spaniel to be positive for brain hemorrhage.  Mother died of complication of that.  Has a brother who died from massive bleed from gastric ulcer.  He was said to be an  alcoholic and also the family history of diabetes and hypertension.  SOCIAL HISTORY:  The patient is widowed, lives with her youngest daughter and a son-in-law.  No history of alcohol or tobacco use.  REVIEW OF SYSTEMS:  Essentially documented in the initial history and physical.  PHYSICAL EXAMINATION:  At the time, the patient was seen by the admitting physician. VITAL SIGNS:  Temperature 101.6, heart rate was 111, respiratory rate 18, blood pressure is 141/62, subsequent repeat of blood pressure after receiving antipyretic i.e. Tylenol, temperature 99.6, heart rate 86, respiratory rate 18, blood pressure 100/58, and she was said to be saturating 95% on room air. HEENT:  Pupils were reactive to light and extraocular muscles are intact. NECK:  She has no jugular venous distention.  No carotid bruit.  No lymphadenopathy.  There is no thyromegaly. CHEST:  Said to be clear to auscultation. HEART:  Sounds are one and two. ABDOMEN:  Soft, nontender.  Liver, spleen, and kidneys are not palpable. Bowel sounds are positive. EXTREMITIES:  Showed trace edema. NEUROLOGIC:  Nonfocal. MUSCULOSKELETAL:  Shows arthritic changes in the knees and in the feet. NEUROPSYCHIATRIC:  Unremarkable. SKIN:  Showed left mandibular swelling with surrounding areas of erythema.  LABORATORY DATA:  Chem-8 stat showed hemoglobin of 11.2, hematocrit 33.0.  Sodium 137, potassium of 3.9, chloride is 104, glucose is 124, BUN is 23, creatinine is 1.90.  Complete blood count with differential showed WBC of 10.9, hemoglobin 11.4, hematocrit 33.5, MCV of 91.3 with a platelet count of 164, neutrophil is 79% and absolute neutrophil count is 8.6.  Comprehensive metabolic panel showed sodium of 137, potassium of 3.6, chloride of 104 with a bicarb of 23, glucose is 110, BUN is 22, creatinine is 1.58.  Lipid panel is unremarkable.  Hemoglobin A1c 5.3. Thyroid panel done include TSH 6.036, T4 0.83 and T3 2.5.  A  repeat complete blood count with no differential done on June 29, 2010 showed WBC of 4.3, hemoglobin of 9.6, hematocrit of 28.2, MCV of 91.3 with a platelet count of 153 and comprehensive metabolic panel showed sodium of 139, potassium of 3.9, chloride of 109 with a bicarb of 22, glucose is 83, BUN is 9, creatinine is 1.217, magnesium level is 1.8.  HOSPITAL COURSE:  The patient was admitted to general medical floor with an impression of siadenitis and acute renal failure.  She was started on normal saline IV to go at rate of 100 mL an hour x1.5 liters and subsequently 75 mL an hour.  She was also given IV antibiotics, Cipro 400 mg IV q.8 hourly, and clindamycin 600 mg IV q.i.d., and warm compresses applied to the left mandibular region.  The dose of Cipro was however adjusted by pharmacy to Cipro 400 mg IV q.24 h.  The patient was however seen by me for the very first time in this index admission on June 27, 2010 and examination of the patient's mouth showed erythema around the root of the left molar tooth, extending to the floor of the mouth.  There was also slight left jaw swelling with mild tenderness. Following this encounter, however my impression was that of periodontitis with gingivitis.  However, the patient was continued on IV clindamycin and ciprofloxacin.  The patient was subsequently evaluated by me on a daily basis, and the swelling of the left jaw finally resolved.  The patient was able to swallow without any difficulty.  She was however seen by me today.  Denies any dysphagia.  No fever, no chills, no rigor.  Left jaw swelling has resolved.  Examination of the patient was essentially unremarkable.  Her vital signs:  Blood pressure is 104/57, pulse 69, respiratory 18, temperature is 98.3.  I had an extensive discussion with the entire family about the patient following up with the dentist, and they all verbalized understanding.  I was informed by the patient today that her  daughter has made an appointment for her to see the dentist.  So the plan therefore is for the patient to be discharged home today on activity as tolerated.  Low-sodium, low- cholesterol diet as well as diabetic ADA diet.  She was advised on the need for fluid restriction and to watch out for signs and symptoms of congestive heart failure, which include congestion in the lungs, shortness of breath, progressed swelling of the lower extremity, and should she develop any of these symptoms, should return to the hospital to be evaluated.  MEDICATIONS TO BE TAKEN AT HOME: 1. Clindamycin 600 mg one p.o. t.i.d. for 5 days. 2. Amlodipine 5 mg one p.o. daily at bedtime. 3. Cyanocobalamin (vitamin B12) 1000 mcg one p.o. daily. 4. Diphenhydramine 25 mg over-the-counter one p.o. q.6 h. p.r.n. 5. Furosemide 20 mg one p.o. daily. 6. Iron 65 mg over-the-counter one p.o. b.i.d. 7.  Lisinopril 5 mg one p.o. daily. 8. Vitamin D3 1000 units one p.o. b.i.d.     Talmage Nap, MD     CN/MEDQ  D:  06/29/2010  T:  06/29/2010  Job:  [id number]  cc:   [name], MD  Electronically Signed by Talmage Nap  on 06/29/2010 04:00:01 PM

## 2010-07-04 ENCOUNTER — Encounter: Payer: Self-pay | Admitting: Family Medicine

## 2010-07-06 ENCOUNTER — Encounter: Payer: Self-pay | Admitting: Family Medicine

## 2010-07-06 ENCOUNTER — Ambulatory Visit (INDEPENDENT_AMBULATORY_CARE_PROVIDER_SITE_OTHER): Payer: Medicare Other | Admitting: Family Medicine

## 2010-07-06 VITALS — BP 120/60 | HR 76 | Temp 98.6°F | Ht 65.0 in | Wt 144.2 lb

## 2010-07-06 DIAGNOSIS — I10 Essential (primary) hypertension: Secondary | ICD-10-CM

## 2010-07-06 DIAGNOSIS — N189 Chronic kidney disease, unspecified: Secondary | ICD-10-CM

## 2010-07-06 DIAGNOSIS — K0510 Chronic gingivitis, plaque induced: Secondary | ICD-10-CM | POA: Insufficient documentation

## 2010-07-06 LAB — BASIC METABOLIC PANEL
Calcium: 9.2 mg/dL (ref 8.4–10.5)
Creatinine, Ser: 1.8 mg/dL — ABNORMAL HIGH (ref 0.4–1.2)
GFR: 29.18 mL/min — ABNORMAL LOW (ref >60.00)
Sodium: 141 mEq/L (ref 135–145)

## 2010-07-06 NOTE — Assessment & Plan Note (Signed)
Will recheck function today.

## 2010-07-06 NOTE — Assessment & Plan Note (Signed)
Good control. Cont curr meds. BP Readings from Last 3 Encounters:  07/06/10 120/60  06/19/10 130/66  06/16/10 112/48

## 2010-07-06 NOTE — Patient Instructions (Signed)
Bmet today. Will report via phone tree.

## 2010-07-06 NOTE — Assessment & Plan Note (Signed)
Has responded well and now back to normal. Has appt with dentist. Pt has obvious gum disease.

## 2010-07-06 NOTE — Progress Notes (Signed)
  Subjective:    Patient ID: [name], female    DOB: 13-Sep-1920, 77 y.o.   MRN: [id number]  HPI Pt here for hospital followup for left molar infection. She was initially seen at [facility] and then sent to the ER, from which she was admitted. She has an appt for the dentist next week. By the time she was seen she was dehydrated and had acute renal failure that responded to fluid. She now fels fine and has no complaints. She has been out in the yard as much as able.She is urinating without problem and is drinking fluids well. She takes diuretics.     Review of Systems  Constitutional: Negative for fever, chills, diaphoresis, activity change and fatigue.  HENT: Negative for ear pain, congestion, rhinorrhea and postnasal drip.   Eyes: Negative for redness.  Respiratory: Negative for cough, chest tightness, shortness of breath and wheezing.   Cardiovascular: Negative for chest pain.       Objective:   Physical Exam  Constitutional: She appears well-developed and well-nourished. No distress.  HENT:  Head: Normocephalic and atraumatic.  Right Ear: External ear normal.  Left Ear: External ear normal.  Nose: Nose normal.  Mouth/Throat: Oropharynx is clear and moist. No oropharyngeal exudate.       Left lower gums worse than others with obvious gum erosion.  Eyes: Conjunctivae and EOM are normal. Pupils are equal, round, and reactive to light.  Neck: Normal range of motion. Neck supple. No thyromegaly present.  Cardiovascular: Normal rate, regular rhythm and normal heart sounds.   Pulmonary/Chest: Effort normal and breath sounds normal. She has no wheezes. She has no rales.  Lymphadenopathy:    She has no cervical adenopathy.  Skin: She is not diaphoretic.          Assessment & Plan:

## 2010-07-07 ENCOUNTER — Ambulatory Visit: Payer: Medicare Other | Admitting: Family Medicine

## 2010-07-07 ENCOUNTER — Other Ambulatory Visit: Payer: Self-pay | Admitting: Family Medicine

## 2010-07-07 DIAGNOSIS — N189 Chronic kidney disease, unspecified: Secondary | ICD-10-CM

## 2010-07-27 ENCOUNTER — Other Ambulatory Visit (INDEPENDENT_AMBULATORY_CARE_PROVIDER_SITE_OTHER): Payer: Medicare Other

## 2010-07-27 DIAGNOSIS — N189 Chronic kidney disease, unspecified: Secondary | ICD-10-CM

## 2010-07-27 LAB — BASIC METABOLIC PANEL
BUN: 15 mg/dL (ref 6–23)
Chloride: 109 mEq/L (ref 96–112)
GFR: 39.37 mL/min — ABNORMAL LOW (ref >60.00)
Potassium: 3.7 mEq/L (ref 3.5–5.1)
Sodium: 144 mEq/L (ref 135–145)

## 2010-07-30 ENCOUNTER — Ambulatory Visit (INDEPENDENT_AMBULATORY_CARE_PROVIDER_SITE_OTHER): Payer: Medicare Other | Admitting: Family Medicine

## 2010-07-30 ENCOUNTER — Encounter: Payer: Self-pay | Admitting: Family Medicine

## 2010-07-30 DIAGNOSIS — N189 Chronic kidney disease, unspecified: Secondary | ICD-10-CM

## 2010-07-30 DIAGNOSIS — I509 Heart failure, unspecified: Secondary | ICD-10-CM

## 2010-07-30 DIAGNOSIS — I10 Essential (primary) hypertension: Secondary | ICD-10-CM

## 2010-07-30 DIAGNOSIS — R7309 Other abnormal glucose: Secondary | ICD-10-CM

## 2010-07-30 DIAGNOSIS — R7303 Prediabetes: Secondary | ICD-10-CM

## 2010-07-30 DIAGNOSIS — K0510 Chronic gingivitis, plaque induced: Secondary | ICD-10-CM

## 2010-07-30 NOTE — Progress Notes (Signed)
  Subjective:    Patient ID: Lenice Llamas, female    DOB: Oct 20, 1920, 77 y.o.   MRN: [id number]  HPI Pt here with granddaughter for recheck of kidney fctn and response to fluids from dehydration. She typically has a glass of water with her, am not sure how often she has to refill it. She does a lot of sitting around, sometimes in the swing on the porch and related having more swelling of her ankles since decreasing her diuretic in the hospital when she was dehydrated. She had seven teeth pulled and may be due to have more extracted. She feels reasonably well. She has no complaints today.    Review of SystemsNoncontributory except as above.       Objective:   Physical Exam  Constitutional: She appears well-developed and well-nourished. No distress.  HENT:  Head: Normocephalic and atraumatic.  Right Ear: External ear normal.  Left Ear: External ear normal.  Nose: Nose normal.  Mouth/Throat: Oropharynx is clear and moist. No oropharyngeal exudate.  Eyes: Conjunctivae and EOM are normal. Pupils are equal, round, and reactive to light.  Neck: Normal range of motion. Neck supple. No thyromegaly present.  Cardiovascular: Normal rate, regular rhythm and normal heart sounds.   Pulmonary/Chest: Effort normal and breath sounds normal. She has no wheezes. She has no rales.  Lymphadenopathy:    She has no cervical adenopathy.  Skin: She is not diaphoretic.       Mild 1+ edema of the ankles bilat.          Assessment & Plan:

## 2010-07-30 NOTE — Assessment & Plan Note (Signed)
Nml today

## 2010-07-30 NOTE — Assessment & Plan Note (Signed)
Stable. Cont curr meds. BP Readings from Last 3 Encounters:  07/30/10 124/60  07/06/10 120/60  06/19/10 130/66

## 2010-07-30 NOTE — Patient Instructions (Signed)
RTC 3 mos, Bmet prior fasting.

## 2010-07-30 NOTE — Assessment & Plan Note (Signed)
Resolved

## 2010-07-30 NOTE — Assessment & Plan Note (Signed)
Improved but still a problem. Discussed fluid intake. Avoid salt.

## 2010-07-30 NOTE — Assessment & Plan Note (Signed)
Stable. Cont curr meds.

## 2010-10-27 ENCOUNTER — Other Ambulatory Visit: Payer: Self-pay | Admitting: Family Medicine

## 2010-10-27 DIAGNOSIS — N189 Chronic kidney disease, unspecified: Secondary | ICD-10-CM

## 2010-10-28 ENCOUNTER — Other Ambulatory Visit (INDEPENDENT_AMBULATORY_CARE_PROVIDER_SITE_OTHER): Payer: Medicare Other

## 2010-10-28 DIAGNOSIS — N189 Chronic kidney disease, unspecified: Secondary | ICD-10-CM

## 2010-10-28 LAB — BASIC METABOLIC PANEL
CO2: 27 mEq/L (ref 19–32)
Calcium: 9.2 mg/dL (ref 8.4–10.5)
Chloride: 107 mEq/L (ref 96–112)
Potassium: 4.2 mEq/L (ref 3.5–5.1)
Sodium: 141 mEq/L (ref 135–145)

## 2010-11-04 ENCOUNTER — Encounter: Payer: Self-pay | Admitting: Family Medicine

## 2010-11-04 ENCOUNTER — Ambulatory Visit (INDEPENDENT_AMBULATORY_CARE_PROVIDER_SITE_OTHER): Payer: Medicare Other | Admitting: Family Medicine

## 2010-11-04 VITALS — BP 128/62 | HR 84 | Temp 98.7°F | Ht 65.0 in | Wt 137.5 lb

## 2010-11-04 DIAGNOSIS — Z23 Encounter for immunization: Secondary | ICD-10-CM

## 2010-11-04 DIAGNOSIS — N189 Chronic kidney disease, unspecified: Secondary | ICD-10-CM

## 2010-11-04 DIAGNOSIS — I10 Essential (primary) hypertension: Secondary | ICD-10-CM

## 2010-11-04 NOTE — Patient Instructions (Signed)
Please schedule pt to see Dr Reece Agar in late January for renal follow up with Bmet prior.

## 2010-11-04 NOTE — Progress Notes (Signed)
  Subjective:    Patient ID: [name], female    DOB: [birth year], 77 y.o.   MRN: [id number]  HPI Pt here for three month follow up. She had acute renal failure in the hospital from lack of fluid intake due to gum infection. That has been adequately handled and she feels much better, about back to normal. She has no complaints and feels well.    Review of SystemsNoncontributory except as above.       Objective:   Physical Exam  Constitutional: She appears well-developed and well-nourished. No distress.  HENT:  Head: Normocephalic and atraumatic.  Right Ear: External ear normal.  Left Ear: External ear normal.  Nose: Nose normal.  Mouth/Throat: Oropharynx is clear and moist. No oropharyngeal exudate.  Eyes: Conjunctivae and EOM are normal. Pupils are equal, round, and reactive to light.  Neck: Normal range of motion. Neck supple. No thyromegaly present.  Cardiovascular: Normal rate, regular rhythm and normal heart sounds.   Pulmonary/Chest: Effort normal and breath sounds normal. She has no wheezes. She has no rales.  Abdominal:       No suprapubic pain or tenderness to palpation.  Genitourinary:       No CVAT.  Lymphadenopathy:    She has no cervical adenopathy.  Skin: She is not diaphoretic.          Assessment & Plan:

## 2010-11-04 NOTE — Assessment & Plan Note (Signed)
Good control. Cont Lisinopril and Lasix as has had edema in the past that has required diuretic.

## 2010-11-04 NOTE — Assessment & Plan Note (Signed)
BUN/Cr has worsened again slightly. Discussed at length increased fluid to better hydrate the kidneys. Will have her rechecked after the first of the year would suggest referral to Nephrology if not improved.

## 2011-02-01 ENCOUNTER — Other Ambulatory Visit (INDEPENDENT_AMBULATORY_CARE_PROVIDER_SITE_OTHER): Payer: Medicare Other

## 2011-02-01 DIAGNOSIS — N189 Chronic kidney disease, unspecified: Secondary | ICD-10-CM

## 2011-02-01 LAB — BASIC METABOLIC PANEL
BUN: 30 mg/dL — ABNORMAL HIGH (ref 6–23)
CO2: 28 mEq/L (ref 19–32)
Chloride: 107 mEq/L (ref 96–112)
Potassium: 4.5 mEq/L (ref 3.5–5.1)

## 2011-02-05 ENCOUNTER — Ambulatory Visit (INDEPENDENT_AMBULATORY_CARE_PROVIDER_SITE_OTHER): Payer: Medicare Other | Admitting: Family Medicine

## 2011-02-05 ENCOUNTER — Encounter: Payer: Self-pay | Admitting: Family Medicine

## 2011-02-05 VITALS — BP 136/68 | HR 100 | Temp 97.5°F | Wt 137.5 lb

## 2011-02-05 DIAGNOSIS — I10 Essential (primary) hypertension: Secondary | ICD-10-CM

## 2011-02-05 DIAGNOSIS — N189 Chronic kidney disease, unspecified: Secondary | ICD-10-CM

## 2011-02-05 NOTE — Assessment & Plan Note (Signed)
Slight deterioration. Pt overall stable. Encouraged increase fluid intake - more water.  Discussed avoidance of NSAIDs. On low dose lasix. Noted anemia - recheck next visit along with other blood work, U pr/cr ratio. If no improvement, consider SpepUpep vs referral to nephrology.

## 2011-02-05 NOTE — Progress Notes (Signed)
  Subjective:    Patient ID: [name], female    DOB: 11-01-1920, 78 y.o.   MRN: [id number]  HPI CC: here for labwork f/u  Presents with daughter.  Pt here for three month follow up.  New to me, prior pt of Dr. Lorenza Chick.  S/p ARF in the hospital 06/2010 with peak Cr 1.9, thought from lack of fluid intake due to gum infection.  she feels well today.  She has no complaints.  Specifically no abd pain, fevers/chills, nausea/vomiting, leg swelling.  Mild foot swelling.  Denies HA.  On lasix and lisinopril. As well as norvasc for blood pressure control.  Takes vit D and B12 and iron bid.  Compliant with all meds.  Denies NSAIDs.  Occasional tylenol, benadryl.  Has never seen nephrologist.  Lab Results  Component Value Date   CHOL 132 06/26/2010   HDL 58 06/26/2010   LDLCALC  Value: 63        Total Cholesterol/HDL:CHD Risk Coronary Heart Disease Risk Table                     Men   Women  1/2 Average Risk   3.4   3.3  Average Risk       5.0   4.4  2 X Average Risk   9.6   7.1  3 X Average Risk  23.4   11.0        Use the calculated Patient Ratio above and the CHD Risk Table to determine the patient's CHD Risk.        ATP III CLASSIFICATION (LDL):  <100     mg/dL   Optimal  782-956  mg/dL   Near or Above                    Optimal  130-159  mg/dL   Borderline  213-086  mg/dL   High  >578     mg/dL   Very High 05/01/9627   TRIG 56 06/26/2010   CHOLHDL 2.3 06/26/2010    BP Readings from Last 3 Encounters:  02/05/11 136/68  11/04/10 128/62  07/30/10 124/60   Wt Readings from Last 3 Encounters:  02/05/11 137 lb 8 oz (62.37 kg)  11/04/10 137 lb 8 oz (62.37 kg)  07/30/10 142 lb 12 oz (64.751 kg)    Review of Systems Per HPI    Objective:   Physical Exam  Nursing note and vitals reviewed. Constitutional: She appears well-developed and well-nourished. No distress.  HENT:  Head: Normocephalic and atraumatic.  Mouth/Throat: Oropharynx is clear and moist. No oropharyngeal exudate.  Eyes:  Conjunctivae and EOM are normal. Pupils are equal, round, and reactive to light. No scleral icterus.  Neck: Normal range of motion. Neck supple. Carotid bruit is not present.  Cardiovascular: Normal rate, regular rhythm, normal heart sounds and intact distal pulses.   No murmur heard. Pulmonary/Chest: Effort normal and breath sounds normal. No respiratory distress. She has no wheezes. She has no rales.  Musculoskeletal: She exhibits edema (tr pedal edema).  Lymphadenopathy:    She has no cervical adenopathy.  Skin: Skin is warm and dry. No rash noted.       Assessment & Plan:

## 2011-02-05 NOTE — Patient Instructions (Signed)
Come back in 3-4 months for recheck.  Come in a few days prior for blood work. We will talk about kidney function then. Ensure getting good water throughout the day. Avoid ibuprofen, aleve, advil, motrin. Tylenol and benadryl ok. No changes today.

## 2011-02-05 NOTE — Assessment & Plan Note (Signed)
Above goal today, first time meeting new doc, prior good control.  No changes. Recheck in 3 mo. BP Readings from Last 3 Encounters:  02/05/11 136/68  11/04/10 128/62  07/30/10 124/60

## 2011-02-26 HISTORY — PX: CT HEAD LIMITED W/O CM: HXRAD127

## 2011-03-08 ENCOUNTER — Ambulatory Visit (INDEPENDENT_AMBULATORY_CARE_PROVIDER_SITE_OTHER): Payer: Medicare Other | Admitting: Family Medicine

## 2011-03-08 ENCOUNTER — Encounter: Payer: Self-pay | Admitting: Family Medicine

## 2011-03-08 VITALS — BP 106/60 | HR 75 | Temp 98.4°F | Wt 134.8 lb

## 2011-03-08 DIAGNOSIS — R05 Cough: Secondary | ICD-10-CM

## 2011-03-08 MED ORDER — AZITHROMYCIN 250 MG PO TABS: ORAL_TABLET | ORAL | Status: DC

## 2011-03-08 MED ORDER — BENZONATATE 200 MG PO CAPS: 200.0000 mg | ORAL_CAPSULE | Freq: Three times a day (TID) | ORAL | Status: DC | PRN

## 2011-03-08 NOTE — Patient Instructions (Signed)
Start the antibiotics today and take tessalon for cough.  This should gradually improve over the next week or so.  Take care.  Call with questions.  Glad to see you.

## 2011-03-08 NOTE — Progress Notes (Signed)
Sx going on for a few days.  Feels weak with some cough.  No fevers and no sputum.  Not sob.   No pain anywhere.  Ears feel okay.  No throat pain.  Voice sounds the same.  Eating okay until the last few days.  Drinking fluids well.  No edema.  Mult sick contacts.  Had a flu shot.    Meds, vitals, and allergies reviewed.   ROS: See HPI.  Otherwise, noncontributory.  GEN: nad, alert and oriented HEENT: mucous membranes moist, tm w/o erythema, nasal exam w/o erythema, scant clear discharge noted,  OP with cobblestoning NECK: supple w/o LA CV: rrr.   PULM: mildly coarse BS but no inc wob EXT: no edema SKIN: no acute rash

## 2011-03-10 DIAGNOSIS — J189 Pneumonia, unspecified organism: Secondary | ICD-10-CM

## 2011-03-10 HISTORY — DX: Pneumonia, unspecified organism: J18.9

## 2011-03-10 NOTE — Assessment & Plan Note (Signed)
I don't think this is CHF related, given the mult sick contacts and the lack of edema. Given her history and exposures, I would cover with abx and follow clinically, supportive tx o/w.  Pt and family agree.

## 2011-03-12 ENCOUNTER — Emergency Department (HOSPITAL_COMMUNITY): Payer: Medicare Other

## 2011-03-12 ENCOUNTER — Other Ambulatory Visit: Payer: Medicare Other

## 2011-03-12 ENCOUNTER — Emergency Department (HOSPITAL_COMMUNITY)
Admission: EM | Admit: 2011-03-12 | Discharge: 2011-03-13 | Disposition: A | Discharge status: Home / Self Care | Payer: Medicare Other | Attending: Emergency Medicine | Admitting: Emergency Medicine

## 2011-03-12 ENCOUNTER — Other Ambulatory Visit: Payer: Self-pay

## 2011-03-12 ENCOUNTER — Encounter (HOSPITAL_COMMUNITY): Payer: Self-pay

## 2011-03-12 ENCOUNTER — Ambulatory Visit: Payer: Medicare Other | Admitting: Family Medicine

## 2011-03-12 ENCOUNTER — Ambulatory Visit (INDEPENDENT_AMBULATORY_CARE_PROVIDER_SITE_OTHER): Payer: Medicare Other | Admitting: Family Medicine

## 2011-03-12 ENCOUNTER — Encounter: Payer: Self-pay | Admitting: Family Medicine

## 2011-03-12 DIAGNOSIS — R0602 Shortness of breath: Secondary | ICD-10-CM | POA: Insufficient documentation

## 2011-03-12 DIAGNOSIS — R0989 Other specified symptoms and signs involving the circulatory and respiratory systems: Secondary | ICD-10-CM | POA: Insufficient documentation

## 2011-03-12 DIAGNOSIS — I10 Essential (primary) hypertension: Secondary | ICD-10-CM | POA: Insufficient documentation

## 2011-03-12 DIAGNOSIS — R05 Cough: Secondary | ICD-10-CM

## 2011-03-12 DIAGNOSIS — R059 Cough, unspecified: Secondary | ICD-10-CM | POA: Insufficient documentation

## 2011-03-12 DIAGNOSIS — N289 Disorder of kidney and ureter, unspecified: Secondary | ICD-10-CM

## 2011-03-12 DIAGNOSIS — E785 Hyperlipidemia, unspecified: Secondary | ICD-10-CM | POA: Insufficient documentation

## 2011-03-12 DIAGNOSIS — R079 Chest pain, unspecified: Secondary | ICD-10-CM | POA: Insufficient documentation

## 2011-03-12 DIAGNOSIS — E119 Type 2 diabetes mellitus without complications: Secondary | ICD-10-CM | POA: Insufficient documentation

## 2011-03-12 DIAGNOSIS — F29 Unspecified psychosis not due to a substance or known physiological condition: Secondary | ICD-10-CM | POA: Insufficient documentation

## 2011-03-12 DIAGNOSIS — I509 Heart failure, unspecified: Secondary | ICD-10-CM | POA: Insufficient documentation

## 2011-03-12 DIAGNOSIS — Z79899 Other long term (current) drug therapy: Secondary | ICD-10-CM | POA: Insufficient documentation

## 2011-03-12 LAB — COMPREHENSIVE METABOLIC PANEL
ALT: 19 U/L (ref 0–35)
Albumin: 3.6 g/dL (ref 3.5–5.2)
Alkaline Phosphatase: 46 U/L (ref 39–117)
Chloride: 106 mEq/L (ref 96–112)
GFR calc Af Amer: 39 mL/min — ABNORMAL LOW (ref >90)
Glucose, Bld: 97 mg/dL (ref 70–99)
Potassium: 3.5 mEq/L (ref 3.5–5.1)
Sodium: 141 mEq/L (ref 135–145)
Total Bilirubin: 0.6 mg/dL (ref 0.3–1.2)
Total Protein: 6.6 g/dL (ref 6.0–8.3)

## 2011-03-12 LAB — URINALYSIS, ROUTINE W REFLEX MICROSCOPIC
Glucose, UA: NEGATIVE mg/dL
Ketones, ur: 15 mg/dL — AB
Protein, ur: NEGATIVE mg/dL
pH: 5.5 (ref 5.0–8.0)

## 2011-03-12 LAB — CBC
Hemoglobin: 12.1 g/dL (ref 12.0–15.0)
MCH: 31 pg (ref 26.0–34.0)
Platelets: 152 10*3/uL (ref 150–400)
RBC: 3.9 MIL/uL (ref 3.87–5.11)
WBC: 5.1 10*3/uL (ref 4.0–10.5)

## 2011-03-12 LAB — TROPONIN I: Troponin I: <0.3 ng/mL (ref <0.30)

## 2011-03-12 LAB — DIFFERENTIAL
Eosinophils Absolute: 0.1 10*3/uL (ref 0.0–0.7)
Lymphs Abs: 1.5 10*3/uL (ref 0.7–4.0)
Monocytes Relative: 12 % (ref 3–12)
Neutro Abs: 3 10*3/uL (ref 1.7–7.7)
Neutrophils Relative %: 57 % (ref 43–77)

## 2011-03-12 LAB — GLUCOSE, CAPILLARY: Glucose-Capillary: 92 mg/dL (ref 70–99)

## 2011-03-12 LAB — URINE MICROSCOPIC-ADD ON

## 2011-03-12 NOTE — ED Provider Notes (Signed)
History     CSN: [id number]  Arrival date & time 03/12/11  1757   First MD Initiated Contact with Patient 03/12/11 2020      Chief Complaint  Patient presents with  . Cough    pneumonia    (Consider location/radiation/quality/duration/timing/severity/associated sxs/prior treatment) HPI History provided by the patient and the patient's daughter.  78 year old female presented complaining of cough. Patient reports her symptoms began gradually about one week ago and have been constant and progressive. Cough is described as dry, moderate, and is associated with rare diffuse chest discomfort only noted with coughing and without associated fever, sweats, chills, or vomiting. Patient does have mild shortness of breath and daughter has noticed for the past when she days of mild confusion, including inability to state that presents name or the year. This is notably different from patient's baseline. Patient was seen days ago by her PCP and diagnosed with likely bronchitis and started on Z-Pak.  Patient returned to the PCP today and was sent here for concern for right lower lobe pneumonia secondary to crackles on exam. Daughter cannot recall similar symptoms in the past,  including no history of altered mental status.     Past Medical History  Diagnosis Date  . Hypertension 10/1998  . Hyperlipidemia 12/2002  . Diabetes mellitus type II   . Congestive heart failure (CHF)   . Vaginal delivery     times 4, at home  . Anemia 12/30-01/25/2006    [facility]- CHF- started lasix  . Colon cancer   . History of ETT 03/15/2006    adenosine myoview low risk    Past Surgical History  Procedure Date  . Cholecystectomy 1984  . Partial colectomy 03/25/2006    right  . Esophagogastroduodenoscopy     B9 polyp H.Pylori bx- "dilated" polyps  . History of abd 02/25/2006    bilat renal cysts, no mets    Family History  Problem Relation Age of Onset  . Stroke Sister   . Diabetes Sister   . Hypertension  Sister   . Stroke Mother   . Alcohol abuse Brother     History  Substance Use Topics  . Smoking status: Never Smoker   . Smokeless tobacco: Never Used  . Alcohol Use: No    OB History    Grav Para Term Preterm Abortions TAB SAB Ect Mult Living                  Review of Systems  Constitutional: Negative for fever, chills (although, pt stating that she is "always cold.".) and diaphoresis.  HENT: Negative for congestion, sore throat and rhinorrhea.   Eyes: Negative for pain and visual disturbance.  Respiratory: Positive for cough and shortness of breath. Negative for wheezing.   Cardiovascular: Positive for chest pain (rare, only with cough; none currently or today). Negative for palpitations.  Gastrointestinal: Negative for nausea, vomiting, abdominal pain, diarrhea and blood in stool.  Genitourinary: Negative for dysuria and hematuria.  Musculoskeletal: Negative for back pain and gait problem.  Skin: Negative for rash and wound.  Neurological: Negative for dizziness and headaches.  Psychiatric/Behavioral: Positive for confusion. Negative for agitation.  All other systems reviewed and are negative.    Allergies  Review of patient's allergies indicates no known allergies.  Home Medications   Current Outpatient Rx  Name Route Sig Dispense Refill  . AMLODIPINE BESYLATE 5 MG PO TABS Oral Take 5 mg by mouth nightly.      Marland Kitchen BENZONATATE 200 MG  PO CAPS Oral Take 200 mg by mouth 3 (three) times daily as needed. For cough    . VITAMIN D3 1000 UNITS PO CAPS Oral Take by mouth 2 (two) times daily.      Marland Kitchen FERROUS SULFATE 325 (65 FE) MG PO TABS Oral Take 325 mg by mouth 2 (two) times daily.      . FUROSEMIDE 20 MG PO TABS Oral Take 20 mg by mouth daily. Take 1/2 by mouth daily    . LISINOPRIL 5 MG PO TABS Oral Take 5 mg by mouth daily.    Marland Kitchen VITAMIN B-12 1000 MCG PO TABS Oral Take 1,000 mcg by mouth daily.      . AZITHROMYCIN 250 MG PO TABS  Take 2 tablets (500 mg) on  Day 1,   followed by 1 tablet (250 mg) once daily on Days 2 through 5.      BP 150/56  Pulse 72  Temp(Src) 97.5 F (36.4 C) (Oral)  Resp 18  SpO2 98%  Physical Exam  Nursing note and vitals reviewed. Constitutional:       Poor hearing, alert, oriented to person and place but not year/month or president, NAD  HENT:  Head: Normocephalic and atraumatic.  Right Ear: External ear normal.  Left Ear: External ear normal.  Nose: Nose normal.  Mouth/Throat: Oropharynx is clear and moist.       Moist mucous membranes  Eyes: Conjunctivae and EOM are normal. Pupils are equal, round, and reactive to light.  Neck: Normal range of motion. Neck supple.  Cardiovascular: Normal rate, regular rhythm and intact distal pulses.   No murmur heard. Pulmonary/Chest: Effort normal. No respiratory distress. She has no wheezes. She has rales (faint in Lt lung base). She exhibits no tenderness.  Abdominal: Soft. Bowel sounds are normal. There is no tenderness.  Musculoskeletal: Normal range of motion. She exhibits no edema.  Neurological: She is alert. GCS eye subscore is 4. GCS verbal subscore is 4. GCS motor subscore is 6.  Skin: Skin is warm and dry. No rash noted. She is not diaphoretic.  Psychiatric: She has a normal mood and affect. Judgment normal.    ED Course  Procedures (including critical care time)  Labs Reviewed  CBC - Abnormal; Notable for the following:    HCT 35.5 (*)    All other components within normal limits  COMPREHENSIVE METABOLIC PANEL - Abnormal; Notable for the following:    Creatinine, Ser 1.37 (*)    GFR calc non Af Amer 33 (*)    GFR calc Af Amer 39 (*)    All other components within normal limits  URINALYSIS, ROUTINE W REFLEX MICROSCOPIC - Abnormal; Notable for the following:    APPearance CLOUDY (*)    Bilirubin Urine SMALL (*)    Ketones, ur 15 (*)    Leukocytes, UA MODERATE (*)    All other components within normal limits  URINE MICROSCOPIC-ADD ON - Abnormal; Notable for  the following:    Squamous Epithelial / LPF FEW (*)    Casts HYALINE CASTS (*)    Crystals CA OXALATE CRYSTALS (*)    All other components within normal limits  DIFFERENTIAL  TROPONIN I  GLUCOSE, CAPILLARY  URINE CULTURE   Dg Chest 2 View  03/12/2011  *RADIOLOGY REPORT*  Clinical Data: Cough.  Shortness of breath.  Hypertension. Diabetic.  CHEST - 2 VIEW  Comparison: 04/14/2006  Findings: Patient rotated left. Midline trachea.  Normal heart size and mediastinal contours for age.  Mild pleural thickening blunts the costophrenic angles on the frontal.  There is also biapical pleural thickening. Clear lungs.  Probable nipple shadows projecting over the lung bases on the frontal.  Cholecystectomy clips.  IMPRESSION:  1. No acute cardiopulmonary disease. 2.  Probable nipple shadows bilaterally.  Repeat frontal with nipple markers could confirm.  Original Report Authenticated By: Consuello Bossier, M.D.     1. Cough   2. Mild renal insufficiency       MDM  78 year old female with history of CHF, diabetes, hypertension brought by family secondary to cough for one week and might alter now status. The PCP concern for pneumonia.  Exam as above, AF/VSS, minimal rales in the left posterior lung base; patient disoriented to date.  Chest x-ray ordered and without focal consolidation. Labs without clear evidence of UTI; urine culture pending. Creatinine similar to prior; no leukocytosis, no significant abnormal lytes.  Patient possibly with bronchitis or viral illness. Doubt ACS, pneumonia, or PE.  CT head without acute pathology.  Family comfortable with patient's discharge with strict return precautions and PCP followup on Monday.        Particia Lather, MD 03/13/11 [phone]

## 2011-03-12 NOTE — ED Notes (Signed)
Pt has been having a cough for the past week. She went to [place] at [facility] and dr.dunkin was there and sent her in for possible pneumonia. Pt has a productive cough and was recently seen and treated for bronchitis in the office with no relief. Pt is confused about situation but this is baseline.

## 2011-03-12 NOTE — Progress Notes (Signed)
98% RA.    Pt seen earlier this week with cough, started on zithromax.  Since then, likely with diarrhea per family ., inc in cough, and more confused.  Prev was doing 1000 piece jigsaw puzzles, now not able to state the year or president.  No vomiting.    Meds, vitals, and allergies reviewed.   ROS: See HPI.  Otherwise, noncontributory.  Nad, able to follow commands but not oriented (this is an recent change) Mmm No inc in wob but faint crackles in RLL abd soft Ext w/o edema

## 2011-03-12 NOTE — Assessment & Plan Note (Signed)
Now with disorientation, concern for RLL pna.  D/w pt and family.  I offered EMS call, but with family here to transport I think private car would be reasonable (EMS ride may be more disorienting).  Family agrees to take pt straight to [facility] ER, charge RN aware of pending arrival.

## 2011-03-12 NOTE — ED Notes (Signed)
Old and new ECG given to Dr. Rubin Payor and a copy is placed on the chart

## 2011-03-13 NOTE — Discharge Instructions (Signed)
Complete course of zpack antibiotic.  Follow up with your doctor on Monday even if well.  Return immediately if you experience worsening shortness of breath, cough, chest pain, altered mental status, or other concerns.     Bronchitis Bronchitis is the body's way of reacting to injury and/or infection (inflammation) of the bronchi. Bronchi are the air tubes that extend from the windpipe into the lungs. If the inflammation becomes severe, it may cause shortness of breath. CAUSES  Inflammation may be caused by:  A virus.   Germs (bacteria).   Dust.   Allergens.   Pollutants and many other irritants.  The cells lining the bronchial tree are covered with tiny hairs (cilia). These constantly beat upward, away from the lungs, toward the mouth. This keeps the lungs free of pollutants. When these cells become too irritated and are unable to do their job, mucus begins to develop. This causes the characteristic cough of bronchitis. The cough clears the lungs when the cilia are unable to do their job. Without either of these protective mechanisms, the mucus would settle in the lungs. Then you would develop pneumonia. Smoking is a common cause of bronchitis and can contribute to pneumonia. Stopping this habit is the single most important thing you can do to help yourself. TREATMENT   Your caregiver may prescribe an antibiotic if the cough is caused by bacteria. Also, medicines that open up your airways make it easier to breathe. Your caregiver may also recommend or prescribe an expectorant. It will loosen the mucus to be coughed up. Only take over-the-counter or prescription medicines for pain, discomfort, or fever as directed by your caregiver.   Removing whatever causes the problem (smoking, for example) is critical to preventing the problem from getting worse.   Cough suppressants may be prescribed for relief of cough symptoms.   Inhaled medicines may be prescribed to help with symptoms now and to  help prevent problems from returning.   For those with recurrent (chronic) bronchitis, there may be a need for steroid medicines.  SEEK IMMEDIATE MEDICAL CARE IF:   During treatment, you develop more pus-like mucus (purulent sputum).   You have a fever.   Your baby is older than 3 months with a rectal temperature of 102 F (38.9 C) or higher.   Your baby is 64 months old or younger with a rectal temperature of 100.4 F (38 C) or higher.   You become progressively more ill.   You have increased difficulty breathing, wheezing, or shortness of breath.  It is necessary to seek immediate medical care if you are elderly or sick from any other disease. MAKE SURE YOU:   Understand these instructions.   Will watch your condition.   Will get help right away if you are not doing well or get worse.  Document Released: 01/11/2005 Document Revised: 09/23/2010 Document Reviewed: 11/21/2007 [facility] Patient Information 2012 [place], [place].

## 2011-03-13 NOTE — ED Notes (Signed)
Resident speaking with patient and family regarding plan of care.

## 2011-03-14 LAB — URINE CULTURE
Colony Count: 10000
Culture  Setup Time: 201302152341

## 2011-03-15 ENCOUNTER — Ambulatory Visit (INDEPENDENT_AMBULATORY_CARE_PROVIDER_SITE_OTHER): Payer: Medicare Other | Admitting: Family Medicine

## 2011-03-15 ENCOUNTER — Encounter: Payer: Self-pay | Admitting: Family Medicine

## 2011-03-15 DIAGNOSIS — R05 Cough: Secondary | ICD-10-CM

## 2011-03-15 NOTE — ED Provider Notes (Signed)
I saw and evaluated the patient, reviewed the resident's note and I agree with the findings and plan. Patient sent in for evaluation of possible pneumonia. X-ray order with no focal consolidation. Lab work is reassuring to cardiac or PE as cause. Patient has followup with her doctor on Monday  Juliet Rude. Rubin Payor, MD 03/15/11 [phone]

## 2011-03-15 NOTE — Progress Notes (Signed)
Subjective:    Patient ID: [name], female    DOB: 1920-02-28, 78 y.o.   MRN: [id number]  HPI CC: ER f/u  Presents with daughter, Myriam Jacobson.  Pleasant 78yo with h/o CHF, DM, HTN, HLD, CRI presents for hosp f/u after [facility] ER eval on Friday, all records reviewed.  Seen here by Dr. D last week with concern for bronchitis, started on zpack.  Pt returned at end of week with worsening confusion - could not identify time or president which was change from baseline.  Sent to ER with concern for developing PNA.  ER eval returned with normal CXR, blood work overall normal, actually Cr improved to 1.38.  Normal WBC, normal lytes.  Actually Head CT obtained as well - normal.  UCx negative for infection.  Diagnosed with bronchitis vs viral illness.  Pt presents today for f/u.  Cough "a whole lot better", decreased productive.  Yesterday worked on puzzle afternoon.  Finished antibiotic (zpack), still using tessalon perls which do help some.  Getting plenty of rest.  Good fluid intake.  Appetite ok.  Denies fevers/chills, chest pain.  Occasional SOB.  No h/o asthma, COPD.  Smokers at home but smoke outside.  No slurring of speech, no unilateral weakness.  Medications and allergies reviewed and updated in chart.  Past histories reviewed and updated if relevant as below. Patient Active Problem List  Diagnoses  . POLYP, COLON  . Glucose intolerance (pre-diabetes)  . UNSPECIFIED VITAMIN D DEFICIENCY  . HYPERLIPIDEMIA  . PERNICIOUS ANEMIA  . ANEMIA, B12 DEFICIENCY  . HYPERTENSION  . CHF  . CKD (chronic kidney disease), stage III  . Personal history of malignant neoplasm of gastrointestinal tract  . Cough   Past Medical History  Diagnosis Date  . Hypertension 10/1998  . Hyperlipidemia 12/2002  . Diabetes mellitus type II   . Congestive heart failure (CHF)   . Vaginal delivery     times 4, at home  . Anemia 12/30-01/25/2006    [facility]- CHF- started lasix  . Colon cancer   . History of ETT  03/15/2006    adenosine myoview low risk   Past Surgical History  Procedure Date  . Cholecystectomy 1984  . Partial colectomy 03/25/2006    right  . Esophagogastroduodenoscopy     B9 polyp H.Pylori bx- "dilated" polyps  . History of abd 02/25/2006    bilat renal cysts, no mets   History  Substance Use Topics  . Smoking status: Never Smoker   . Smokeless tobacco: Never Used  . Alcohol Use: No   Family History  Problem Relation Age of Onset  . Stroke Sister   . Diabetes Sister   . Hypertension Sister   . Stroke Mother   . Alcohol abuse Brother    No Known Allergies Current Outpatient Prescriptions on File Prior to Visit  Medication Sig Dispense Refill  . amLODipine (NORVASC) 5 MG tablet Take 5 mg by mouth nightly.        . benzonatate (TESSALON) 200 MG capsule Take 200 mg by mouth 3 (three) times daily as needed. For cough      . Cholecalciferol (VITAMIN D3) 1000 UNITS CAPS Take by mouth 2 (two) times daily.        . ferrous sulfate 325 (65 FE) MG tablet Take 325 mg by mouth 2 (two) times daily.        . furosemide (LASIX) 20 MG tablet Take 20 mg by mouth daily. Take 1/2 by mouth daily      .  lisinopril (PRINIVIL,ZESTRIL) 5 MG tablet Take 5 mg by mouth daily.      . vitamin B-12 (CYANOCOBALAMIN) 1000 MCG tablet Take 1,000 mcg by mouth daily.         Review of Systems Per HPI    Objective:   Physical Exam  Nursing note and vitals reviewed. Constitutional: She appears well-developed and well-nourished. No distress.  HENT:  Head: Normocephalic and atraumatic.  Mouth/Throat: Oropharynx is clear and moist. No oropharyngeal exudate.  Eyes: Conjunctivae and EOM are normal. Pupils are equal, round, and reactive to light. No scleral icterus.  Neck: Normal range of motion. Neck supple.  Cardiovascular: Normal rate, regular rhythm, normal heart sounds and intact distal pulses.   No murmur heard. Pulmonary/Chest: Effort normal. No respiratory distress. She has no wheezes. She has  rales (mild LLL rales/crackle).       Occasional rattly cough  Musculoskeletal: She exhibits no edema.  Lymphadenopathy:    She has no cervical adenopathy.  Neurological:       Oriented to person and place. Uses calendar in room to cue year. President "that colored man"  Skin: Skin is warm and dry. No rash noted.  Psychiatric: She has a normal mood and affect.       Assessment & Plan:

## 2011-03-15 NOTE — Patient Instructions (Signed)
Good to see you today, call us with questions. I expect you to feel better each day. Continue tessalon perls (swallow don't chew). I do think you had bad case of bronchitis.  Should get better with time.

## 2011-03-15 NOTE — Assessment & Plan Note (Addendum)
Reviewed all records from Dr. Algis Downs as well as [facility] ER. Anticipate bronchitis, slowly resolving, encouraged and reassurred. Continue tessalon perls. AMS slowly resolving as well along with bronchitis, anticipate due to recent infection. Today more aware, has been working on crossword puzzle.

## 2011-03-17 ENCOUNTER — Encounter: Payer: Medicare Other | Admitting: Family Medicine

## 2011-03-18 ENCOUNTER — Encounter: Payer: Medicare Other | Admitting: Family Medicine

## 2011-04-01 ENCOUNTER — Other Ambulatory Visit: Payer: Self-pay | Admitting: *Deleted

## 2011-04-01 MED ORDER — AMLODIPINE BESYLATE 5 MG PO TABS: 5.0000 mg | ORAL_TABLET | Freq: Every evening | ORAL | Status: DC

## 2011-04-29 ENCOUNTER — Other Ambulatory Visit: Payer: Self-pay | Admitting: Family Medicine

## 2011-05-03 ENCOUNTER — Other Ambulatory Visit (INDEPENDENT_AMBULATORY_CARE_PROVIDER_SITE_OTHER): Payer: Medicare Other

## 2011-05-03 DIAGNOSIS — E782 Mixed hyperlipidemia: Secondary | ICD-10-CM

## 2011-05-03 DIAGNOSIS — N189 Chronic kidney disease, unspecified: Secondary | ICD-10-CM

## 2011-05-03 LAB — CBC WITH DIFFERENTIAL/PLATELET
Basophils Absolute: 0 10*3/uL (ref 0.0–0.1)
Basophils Relative: 0.5 % (ref 0.0–3.0)
Eosinophils Relative: 3.4 % (ref 0.0–5.0)
Hemoglobin: 12.8 g/dL (ref 12.0–15.0)
Lymphocytes Relative: 30.1 % (ref 12.0–46.0)
Monocytes Relative: 7.4 % (ref 3.0–12.0)
Neutro Abs: 2.9 10*3/uL (ref 1.4–7.7)
RBC: 4.03 Mil/uL (ref 3.87–5.11)
RDW: 14.4 % (ref 11.5–14.6)
WBC: 4.9 10*3/uL (ref 4.5–10.5)

## 2011-05-03 LAB — COMPREHENSIVE METABOLIC PANEL
ALT: 16 U/L (ref 0–35)
CO2: 28 mEq/L (ref 19–32)
Calcium: 9.6 mg/dL (ref 8.4–10.5)
Chloride: 107 mEq/L (ref 96–112)
Creatinine, Ser: 1.5 mg/dL — ABNORMAL HIGH (ref 0.4–1.2)
GFR: 34.53 mL/min — ABNORMAL LOW (ref >60.00)
Glucose, Bld: 90 mg/dL (ref 70–99)

## 2011-05-03 LAB — MICROALBUMIN / CREATININE URINE RATIO: Microalb, Ur: 1 mg/dL (ref 0.0–1.9)

## 2011-05-06 ENCOUNTER — Ambulatory Visit: Payer: Medicare Other | Admitting: Family Medicine

## 2011-05-07 ENCOUNTER — Ambulatory Visit (INDEPENDENT_AMBULATORY_CARE_PROVIDER_SITE_OTHER): Payer: Medicare Other | Admitting: Family Medicine

## 2011-05-07 ENCOUNTER — Encounter: Payer: Self-pay | Admitting: Family Medicine

## 2011-05-07 VITALS — BP 130/82 | HR 76 | Temp 98.3°F | Ht 66.5 in | Wt 130.1 lb

## 2011-05-07 DIAGNOSIS — R7309 Other abnormal glucose: Secondary | ICD-10-CM

## 2011-05-07 DIAGNOSIS — I509 Heart failure, unspecified: Secondary | ICD-10-CM

## 2011-05-07 DIAGNOSIS — R7303 Prediabetes: Secondary | ICD-10-CM

## 2011-05-07 DIAGNOSIS — I10 Essential (primary) hypertension: Secondary | ICD-10-CM

## 2011-05-07 NOTE — Assessment & Plan Note (Signed)
Stable overall.   Anemia has resolved.

## 2011-05-07 NOTE — Assessment & Plan Note (Signed)
Seems stable. Continue meds (lasix and lisinopril).

## 2011-05-07 NOTE — Patient Instructions (Addendum)
Good to see you today.  No changes in medicines today. Return in 6 months for physical, a few days prior fasting for blood work.

## 2011-05-07 NOTE — Assessment & Plan Note (Signed)
Check A1c next visit. Unclear if DM or prediabetes or any issue at all.

## 2011-05-07 NOTE — Progress Notes (Signed)
  Subjective:    Patient ID: [name], female    DOB: Jun 22, 1920, 78 y.o.   MRN: [id number]  HPI CC: 3 mo f/u  78yo with h/o CHF, diet controlled DM, HTN, and CRI stage 3 presents for 3 mo f/u.  CHF - Brings log of weight ranging from 129-137 lbs over last several months.  Seems to be well hydrated.  Recent bronchitis with AMS resolving.  No more cough.  HTN - No HA, vision changes, CP/tightness, SOB, leg swelling.   CRI - on ACEI.  Mild anemia has resolved.  Takes tylenol and aspirin rarely.  Knows to avoid NSAIDs and stay hydrated.  No headaches, back pain or bone pain.  No diarrhea, constipation.  Lab Results  Component Value Date   CREATININE 1.5* 05/03/2011   DM - dx of this in chart, pt denies.  Seems diet controlled, consider A1c next blood work.   Past Medical History  Diagnosis Date  . Hypertension 10/1998  . Hyperlipidemia 12/2002  . Diabetes mellitus type II   . Congestive heart failure (CHF)   . Vaginal delivery     times 4, at home  . Anemia 12/30-01/25/2006    [facility]- CHF- started lasix  . Colon cancer   . History of ETT 03/15/2006    adenosine myoview low risk    Review of Systems Per HPI    Objective:   Physical Exam  Nursing note and vitals reviewed. Constitutional: She appears well-developed and well-nourished. No distress.  HENT:  Head: Normocephalic and atraumatic.  Mouth/Throat: Oropharynx is clear and moist. No oropharyngeal exudate.  Eyes: Conjunctivae and EOM are normal. Pupils are equal, round, and reactive to light. No scleral icterus.  Cardiovascular: Normal rate, regular rhythm, normal heart sounds and intact distal pulses.   No murmur heard. Pulmonary/Chest: Effort normal and breath sounds normal. No respiratory distress. She has no wheezes. She has no rales.  Musculoskeletal: She exhibits no edema.  Skin: Skin is warm and dry. No rash noted.       Assessment & Plan:

## 2011-05-07 NOTE — Assessment & Plan Note (Signed)
Good control, continue med 

## 2011-06-08 ENCOUNTER — Telehealth: Payer: Self-pay

## 2011-06-08 NOTE — Telephone Encounter (Signed)
[name] with [employer] said pt is enrolled in heart failure monitoring program and wants to confirm pt has diagnosis of CHF due to fact pt is not on beta blocker. [name] can be reached at [phone] ext [phone].Please advise.

## 2011-06-08 NOTE — Telephone Encounter (Signed)
Message left notifying [name]. Instructed to call with any further questions.

## 2011-06-08 NOTE — Telephone Encounter (Signed)
plz verify dx of diastolic CHF.

## 2011-06-08 NOTE — Telephone Encounter (Signed)
Angela notified. She did ask the reason as to why patient isn't on a beta blocker. I advised I would have to call her back after you advised.

## 2011-06-08 NOTE — Telephone Encounter (Signed)
He has dx of diastolic CHF - not as important to be on beta blocker as with systolic CHF.  Not currently needing.

## 2011-07-24 ENCOUNTER — Other Ambulatory Visit: Payer: Self-pay | Admitting: Family Medicine

## 2011-07-27 ENCOUNTER — Other Ambulatory Visit: Payer: Self-pay | Admitting: Family Medicine

## 2011-10-28 ENCOUNTER — Other Ambulatory Visit: Payer: Self-pay | Admitting: Family Medicine

## 2011-10-28 DIAGNOSIS — E785 Hyperlipidemia, unspecified: Secondary | ICD-10-CM

## 2011-10-28 DIAGNOSIS — E559 Vitamin D deficiency, unspecified: Secondary | ICD-10-CM

## 2011-10-28 DIAGNOSIS — D510 Vitamin B12 deficiency anemia due to intrinsic factor deficiency: Secondary | ICD-10-CM

## 2011-11-01 ENCOUNTER — Other Ambulatory Visit (INDEPENDENT_AMBULATORY_CARE_PROVIDER_SITE_OTHER): Payer: Medicare Other

## 2011-11-01 DIAGNOSIS — E785 Hyperlipidemia, unspecified: Secondary | ICD-10-CM

## 2011-11-01 DIAGNOSIS — D510 Vitamin B12 deficiency anemia due to intrinsic factor deficiency: Secondary | ICD-10-CM

## 2011-11-01 DIAGNOSIS — E559 Vitamin D deficiency, unspecified: Secondary | ICD-10-CM

## 2011-11-01 LAB — RENAL FUNCTION PANEL
Albumin: 3.5 g/dL (ref 3.5–5.2)
Calcium: 9.4 mg/dL (ref 8.4–10.5)
Creatinine, Ser: 1.6 mg/dL — ABNORMAL HIGH (ref 0.4–1.2)
Glucose, Bld: 88 mg/dL (ref 70–99)
Sodium: 141 mEq/L (ref 135–145)

## 2011-11-01 LAB — LIPID PANEL
HDL: 60.9 mg/dL (ref >39.00)
LDL Cholesterol: 85 mg/dL (ref 0–99)
Total CHOL/HDL Ratio: 3
Triglycerides: 64 mg/dL (ref 0.0–149.0)

## 2011-11-08 ENCOUNTER — Encounter: Payer: Self-pay | Admitting: Family Medicine

## 2011-11-08 ENCOUNTER — Ambulatory Visit (INDEPENDENT_AMBULATORY_CARE_PROVIDER_SITE_OTHER): Payer: Medicare Other | Admitting: Family Medicine

## 2011-11-08 VITALS — BP 124/60 | HR 88 | Temp 98.1°F | Ht 66.0 in | Wt 127.5 lb

## 2011-11-08 DIAGNOSIS — I5030 Unspecified diastolic (congestive) heart failure: Secondary | ICD-10-CM

## 2011-11-08 DIAGNOSIS — R7303 Prediabetes: Secondary | ICD-10-CM

## 2011-11-08 DIAGNOSIS — D518 Other vitamin B12 deficiency anemias: Secondary | ICD-10-CM

## 2011-11-08 DIAGNOSIS — I10 Essential (primary) hypertension: Secondary | ICD-10-CM

## 2011-11-08 DIAGNOSIS — R634 Abnormal weight loss: Secondary | ICD-10-CM | POA: Insufficient documentation

## 2011-11-08 DIAGNOSIS — E559 Vitamin D deficiency, unspecified: Secondary | ICD-10-CM

## 2011-11-08 DIAGNOSIS — E785 Hyperlipidemia, unspecified: Secondary | ICD-10-CM

## 2011-11-08 DIAGNOSIS — Z0000 Encounter for general adult medical examination without abnormal findings: Secondary | ICD-10-CM | POA: Insufficient documentation

## 2011-11-08 DIAGNOSIS — Z23 Encounter for immunization: Secondary | ICD-10-CM

## 2011-11-08 DIAGNOSIS — Z8500 Personal history of malignant neoplasm of unspecified digestive organ: Secondary | ICD-10-CM

## 2011-11-08 NOTE — Progress Notes (Signed)
Subjective:    Patient ID: [name], female    DOB: 03-11-1920, 78 y.o.   MRN: [id number]  HPI CC: medicare wellness  [name] is a pleasant [phone] with h/o diastolic CHF, HTN, and CRI stage 3 presents for medicare wellness visit. Lab Results  Component Value Date   HGBA1C  Value: 5.3 (NOTE)                                                                       According to the ADA Clinical Practice Recommendations for 2011, when HbA1c is used as a screening test:   >=6.5%   Diagnostic of Diabetes Mellitus           (if abnormal result  is confirmed)  5.7-6.4%   Increased risk of developing Diabetes Mellitus  References:Diagnosis and Classification of Diabetes Mellitus,Diabetes Care,2011,34(Suppl 1):S62-S69 and Standards of Medical Care in         Diabetes - 2011,Diabetes Care,2011,34  (Suppl 1):S11-S61. 06/26/2010  diabetes removed from problem list.  stooling well.   Denies constipation  Preventative: Colonoscopy 03/2010 - 1 polyp, int hemorrhoids.  rec f/u with GI PRN. [name]) Mammogram - possibly 2009. Flu shot - today. Tetanus 2004 Pneumovax completed. Shingles shot - will check with insurance. Advanced directives: would want HCPOA to be youngest son and daughter who she lives with.  Discussed advanced directive.  Denies depression, anxiety, sadness, anhedonia. No falls in last year.  To schedule eye exam.  Due Declines audiology eval although failed hearing test.  Medications and allergies reviewed and updated in chart.  Past histories reviewed and updated if relevant as below. Patient Active Problem List  Diagnosis  . POLYP, COLON  . Glucose intolerance (pre-diabetes)  . UNSPECIFIED VITAMIN D DEFICIENCY  . HYPERLIPIDEMIA  . PERNICIOUS ANEMIA  . ANEMIA, B12 DEFICIENCY  . HYPERTENSION  . Diastolic CHF  . CKD (chronic kidney disease), stage III  . Personal history of malignant neoplasm of gastrointestinal tract   Past Medical History  Diagnosis Date  .  Hypertension 10/1998  . Hyperlipidemia 12/2002  . Diastolic CHF   . Vaginal delivery     times 4, at home  . Anemia 12/30-01/25/2006    [facility]- CHF- started lasix  . Colon cancer   . History of ETT 03/15/2006    adenosine myoview low risk   Past Surgical History  Procedure Date  . Cholecystectomy 1984  . Partial colectomy 03/25/2006    right  . Esophagogastroduodenoscopy     B9 polyp H.Pylori bx- "dilated" polyps  . History of abd 02/25/2006    bilat renal cysts, no mets  . Ct head limited w/o cm 02/2011    no acute process.  Mild to moderate cortical volume loss and scattered small vessel ischemic microangiopathy  . Colonoscopy 03/2010    1 tubular adenoma, int hemorrhoids, f/u prn [name])   History  Substance Use Topics  . Smoking status: Never Smoker   . Smokeless tobacco: Never Used  . Alcohol Use: No   Family History  Problem Relation Age of Onset  . Stroke Sister   . Diabetes Sister   . Hypertension Sister   . Stroke Mother   . Alcohol abuse Brother    No Known  Allergies Current Outpatient Prescriptions on File Prior to Visit  Medication Sig Dispense Refill  . amLODipine (NORVASC) 5 MG tablet Take 1 tablet (5 mg total) by mouth Nightly.  30 tablet  11  . Cholecalciferol (VITAMIN D3) 1000 UNITS CAPS Take by mouth 2 (two) times daily.        . ferrous sulfate 325 (65 FE) MG tablet Take 325 mg by mouth 2 (two) times daily.        . furosemide (LASIX) 20 MG tablet Take 1/2 tablet every morning.  30 tablet  6  . lisinopril (PRINIVIL,ZESTRIL) 5 MG tablet TAKE 1 TABLET EVERY DAY  30 tablet  6  . vitamin B-12 (CYANOCOBALAMIN) 1000 MCG tablet Take 1,000 mcg by mouth daily.        Marland Kitchen DISCONTD: furosemide (LASIX) 20 MG tablet Take 20 mg by mouth daily. Take 1/2 by mouth daily         Review of Systems  Constitutional: Negative for fever, chills, activity change, appetite change, fatigue and unexpected weight change.  HENT: Negative for hearing loss and neck pain.   Eyes:  Negative for visual disturbance.  Respiratory: Negative for cough, chest tightness, shortness of breath and wheezing.   Cardiovascular: Negative for chest pain, palpitations and leg swelling.  Gastrointestinal: Negative for nausea, vomiting, abdominal pain, diarrhea, constipation, blood in stool and abdominal distention.  Genitourinary: Negative for hematuria and difficulty urinating.  Musculoskeletal: Negative for myalgias and arthralgias.  Skin: Negative for rash.  Neurological: Negative for dizziness, seizures, syncope and headaches.  Hematological: Does not bruise/bleed easily.  Psychiatric/Behavioral: Negative for dysphoric mood. The patient is not nervous/anxious.        Objective:   Physical Exam  Nursing note and vitals reviewed. Constitutional: She is oriented to person, place, and time. She appears well-developed and well-nourished. No distress.  HENT:  Head: Normocephalic and atraumatic.  Right Ear: Hearing, external ear and ear canal normal.  Left Ear: Hearing, external ear and ear canal normal.  Nose: Nose normal.  Mouth/Throat: Oropharynx is clear and moist. No oropharyngeal exudate.       Cerumen impaction bilateral ears  Eyes: Conjunctivae normal and EOM are normal. Pupils are equal, round, and reactive to light. No scleral icterus.  Neck: Normal range of motion. Neck supple.  Cardiovascular: Normal rate, regular rhythm, normal heart sounds and intact distal pulses.   No murmur heard. Pulses:      Radial pulses are 2+ on the right side, and 2+ on the left side.  Pulmonary/Chest: Effort normal and breath sounds normal. No respiratory distress. She has no wheezes. She has no rales.  Abdominal: Soft. Bowel sounds are normal. She exhibits no distension and no mass. There is no tenderness. There is no rebound and no guarding.  Musculoskeletal: Normal range of motion. She exhibits no edema.  Lymphadenopathy:    She has no cervical adenopathy.  Neurological: She is alert  and oriented to person, place, and time.       CN grossly intact, station and gait intact  Skin: Skin is warm and dry. No rash noted.  Psychiatric: She has a normal mood and affect. Her behavior is normal. Judgment and thought content normal.       Assessment & Plan:

## 2011-11-08 NOTE — Assessment & Plan Note (Signed)
Last colonoscopy 06/19/2010, 1 adenomatous polyp removed.  No planned f/u.

## 2011-11-08 NOTE — Assessment & Plan Note (Signed)
Chronic ,stable. Knows to avoid NSAIDs and stay well hydrated 

## 2011-11-08 NOTE — Assessment & Plan Note (Signed)
removed diabetes from list.  Seems to not even be prediabetic anymore.  Continue to monitor.

## 2011-11-08 NOTE — Assessment & Plan Note (Signed)
#  s have improved.  Will remove from problem list.  Noted continued weight loss.

## 2011-11-08 NOTE — Assessment & Plan Note (Signed)
Wt Readings from Last 3 Encounters:  11/08/11 127 lb 8 oz (57.834 kg)  05/07/11 130 lb 1.3 oz (59.004 kg)  03/15/11 134 lb (60.782 kg)  noted 20 lb weight loss in last few years.  Continue to monitor.  UTD colonsocopy in h/o colon cancer.  Due for breast cancer screening.

## 2011-11-08 NOTE — Assessment & Plan Note (Signed)
Appropriately replaced.  Continue supplementation.

## 2011-11-08 NOTE — Assessment & Plan Note (Addendum)
I have personally reviewed the Medicare Annual Wellness questionnaire and have noted 1. The patient's medical and social history 2. Their use of alcohol, tobacco or illicit drugs 3. Their current medications and supplements 4. The patient's functional ability including ADL's, fall risks, home safety risks and hearing or visual impairment. 5. Diet and physical activity 6. Evidence for depression or mood disorders The patients weight, height, BMI have been recorded in the chart.  Hearing and vision has been addressed. I have made referrals, counseling and provided education to the patient based review of the above and I have provided the pt with a written personalized care plan for preventive services. See scanned questionairre. Advanced directives discussed: will check at home, provided with packet.  States would not want prolonged artificial life support.    Reviewed preventative protocols and updated unless pt declined. rec schedule mammogram, vision exam. Declines audiology evaluation. Possible early mild senile dementia - continue to monitor for now.  No functional status change.

## 2011-11-08 NOTE — Assessment & Plan Note (Signed)
Chronic, stable. Continue meds. Wt Readings from Last 3 Encounters:  11/08/11 127 lb 8 oz (57.834 kg)  05/07/11 130 lb 1.3 oz (59.004 kg)  03/15/11 134 lb (60.782 kg)

## 2011-11-08 NOTE — Patient Instructions (Addendum)
Schedule eye doctor appointment. Check on last mammogram, if due then schedule.  Last one we have copy of was from 2009. Flu shot today. Call your insurace about the shingles shot to see if it is covered or how much it would cost and where is cheaper (here or pharmacy).  If you want to receive here, call for nurse visit. Check with daughter on advanced directive or living will.  If you do have one, bring me a copy.  If not, handout provided for more information. Make sure you stay well hydrated for kidneys and avoid ibuprofen, aleve, advil.  Tylenol is ok. For wax in ears - try equal parts water and hydrogen peroxide (1:1) a few drops every 2-3 wks in ears.  Or may try debrox in ears.

## 2011-11-08 NOTE — Assessment & Plan Note (Signed)
Chronic, stable. continue meds. 

## 2011-11-17 ENCOUNTER — Other Ambulatory Visit: Payer: Self-pay | Admitting: Family Medicine

## 2011-11-17 DIAGNOSIS — Z1231 Encounter for screening mammogram for malignant neoplasm of breast: Secondary | ICD-10-CM

## 2011-11-25 ENCOUNTER — Other Ambulatory Visit: Payer: Self-pay | Admitting: Family Medicine

## 2011-12-20 ENCOUNTER — Ambulatory Visit
Admission: RE | Admit: 2011-12-20 | Discharge: 2011-12-20 | Disposition: A | Discharge status: Home / Self Care | Payer: Medicare Other | Source: Ambulatory Visit | Attending: Family Medicine | Admitting: Family Medicine

## 2011-12-20 DIAGNOSIS — Z1231 Encounter for screening mammogram for malignant neoplasm of breast: Secondary | ICD-10-CM

## 2011-12-22 ENCOUNTER — Encounter: Payer: Self-pay | Admitting: *Deleted

## 2012-03-07 ENCOUNTER — Other Ambulatory Visit: Payer: Self-pay | Admitting: Family Medicine

## 2012-03-22 ENCOUNTER — Other Ambulatory Visit: Payer: Self-pay | Admitting: Family Medicine

## 2012-04-27 ENCOUNTER — Other Ambulatory Visit: Payer: Self-pay | Admitting: Family Medicine

## 2012-06-30 ENCOUNTER — Ambulatory Visit (INDEPENDENT_AMBULATORY_CARE_PROVIDER_SITE_OTHER): Payer: Medicare Other | Admitting: Family Medicine

## 2012-06-30 ENCOUNTER — Encounter: Payer: Self-pay | Admitting: Family Medicine

## 2012-06-30 VITALS — BP 132/60 | HR 84 | Temp 98.3°F | Wt 131.0 lb

## 2012-06-30 DIAGNOSIS — J209 Acute bronchitis, unspecified: Secondary | ICD-10-CM | POA: Insufficient documentation

## 2012-06-30 MED ORDER — BENZONATATE 100 MG PO CAPS: 100.0000 mg | ORAL_CAPSULE | Freq: Two times a day (BID) | ORAL | Status: DC | PRN

## 2012-06-30 MED ORDER — AMOXICILLIN-POT CLAVULANATE 875-125 MG PO TABS: 1.0000 | ORAL_TABLET | Freq: Two times a day (BID) | ORAL | Status: DC

## 2012-06-30 NOTE — Assessment & Plan Note (Addendum)
Given duration and progression of sxs - will treat bronchitis with course of augmentin. Tessalon perls as well as plain mucinex for cough. Update if sxs persist or worsen. Pt agrees with plan. H/o ER evaluation last year for bronchitis complicated by AMS.

## 2012-06-30 NOTE — Patient Instructions (Signed)
I think you may be developing bronchitis. Treat with augmentin twice daily for 8 days, tessalon perls (swallow, don't chew), and plain mucinex with plenty of fluid. Let us know if fever> 101, or worsening productive cough or just not improving as expected. Good to see you today, call us with questions.

## 2012-06-30 NOTE — Progress Notes (Signed)
  Subjective:    Patient ID: [name], female    DOB: Nov 09, 1920, 79 y.o.   MRN: [id number]  HPI CC: cough  3-4 wk h/o cough, worsening over last 3-4 days.  Cough dry.  On ACEI.  Has tried benadryl which helped.  No fevers/chills, abd pain, PNdrainage, ST, wheezing, or SOB.  No smokers at home. No leg swelling. No sick contacts at home.  Past Medical History  Diagnosis Date  . Hypertension 10/1998  . Hyperlipidemia 12/2002  . Diastolic CHF   . Vaginal delivery     times 4, at home  . Anemia 12/30-01/25/2006    [facility]- CHF- started lasix  . Colon cancer   . History of ETT 03/15/2006    adenosine myoview low risk     Review of Systems Per HPI    Objective:   Physical Exam  Nursing note and vitals reviewed. Constitutional: She appears well-developed and well-nourished. No distress.  HENT:  Head: Normocephalic and atraumatic.  Right Ear: Hearing, tympanic membrane, external ear and ear canal normal.  Left Ear: Hearing, tympanic membrane, external ear and ear canal normal.  Nose: Nose normal. No mucosal edema or rhinorrhea. Right sinus exhibits no maxillary sinus tenderness and no frontal sinus tenderness. Left sinus exhibits no maxillary sinus tenderness and no frontal sinus tenderness.  Mouth/Throat: Uvula is midline, oropharynx is clear and moist and mucous membranes are normal. No oropharyngeal exudate, posterior oropharyngeal edema, posterior oropharyngeal erythema or tonsillar abscesses.  Eyes: Conjunctivae and EOM are normal. Pupils are equal, round, and reactive to light. No scleral icterus.  Neck: Normal range of motion. Neck supple.  Cardiovascular: Normal rate, regular rhythm, normal heart sounds and intact distal pulses.   No murmur heard. Pulmonary/Chest: Effort normal and breath sounds normal. No respiratory distress. She has no wheezes. She has no rales.  Mildly coarse bibasilarly  Lymphadenopathy:    She has no cervical adenopathy.  Skin: Skin is warm  and dry. No rash noted.      Assessment & Plan:

## 2012-07-03 ENCOUNTER — Telehealth: Payer: Self-pay | Admitting: *Deleted

## 2012-07-03 NOTE — Telephone Encounter (Signed)
Agree. Thanks

## 2012-07-03 NOTE — Telephone Encounter (Signed)
[facility] called and per E-scribe you only sent in 8 pills of Augmentin on 06-30-12 but instructions said to take 1 BID x 8 days. Verbal order to increase to # 16.

## 2012-09-18 ENCOUNTER — Telehealth: Payer: Self-pay | Admitting: Family Medicine

## 2012-09-18 ENCOUNTER — Encounter: Payer: Self-pay | Admitting: Family Medicine

## 2012-09-18 ENCOUNTER — Ambulatory Visit (INDEPENDENT_AMBULATORY_CARE_PROVIDER_SITE_OTHER): Payer: Medicare Other | Admitting: Family Medicine

## 2012-09-18 VITALS — BP 118/60 | HR 82 | Temp 98.1°F | Wt 126.5 lb

## 2012-09-18 DIAGNOSIS — R059 Cough, unspecified: Secondary | ICD-10-CM

## 2012-09-18 DIAGNOSIS — R05 Cough: Secondary | ICD-10-CM

## 2012-09-18 MED ORDER — BENZONATATE 100 MG PO CAPS: 100.0000 mg | ORAL_CAPSULE | Freq: Two times a day (BID) | ORAL | Status: DC | PRN

## 2012-09-18 NOTE — Telephone Encounter (Signed)
Will see today.  

## 2012-09-18 NOTE — Progress Notes (Signed)
Cough returned recently, worse later in the PM. Episode from 6/14 resolved.  Sx restarted a few weeks ago.  Some sputum, 50% with sputum, 50% dry.  No fevers.  No ear pain. No ST.  Occ rhinorrhea.  No rash, no NAVD.  She is not SOB unless she walks distance; this isn't new or worse.  "I'm 89."  Meds, vitals, and allergies reviewed.   ROS: See HPI.  Otherwise, noncontributory.  nad ncat Tm wnl Minimal nasal irritation OP wnl Neck supple rrr ctab abd soft Ext w/o edema

## 2012-09-18 NOTE — Telephone Encounter (Signed)
Patient Information:  Caller Name: [name]  Phone: [phone]  Patient: Traci, Cook  Gender: Female  DOB: [birth year]  Age: 79 Years  PCP: Eustaquio Boyden [facility])  Office Follow Up:  Does the office need to follow up with this patient?: No  Instructions For The Office: N/A   Symptoms  Reason For Call & Symptoms: Daughter states that her mother is constanting clearing her throat , like she needs to get something up. Occasional cough. Non productive.   She has been taking Benadryl and it does help but it comes back. Slight runny nose, afebrile  Reviewed Health History In EMR: Yes  Reviewed Medications In EMR: Yes  Reviewed Allergies In EMR: Yes  Reviewed Surgeries / Procedures: Yes  Date of Onset of Symptoms: 09/04/2012  Treatments Tried: Benadryl  Treatments Tried Worked: No  Guideline(s) Used:  Cough  Disposition Per Guideline:   See Within 3 Days in Office  Reason For Disposition Reached:   Cough has been present for > 10 days  Advice Given:  Avoid Tobacco Smoke:  Smoking or being exposed to smoke makes coughs much worse.  Prevent Dehydration:  Drink adequate liquids.  This will help soothe an irritated or dry throat and loosen up the phlegm.  Coughing Spasms:  Drink warm fluids. Inhale warm mist (Reason: both relax the airway and loosen up the phlegm).  Suck on cough drops or hard candy to coat the irritated throat.  Call Back If:  Difficulty breathing  Cough lasts more than 3 weeks  You become worse.  Patient Will Follow Care Advice:  YES  Appointment Scheduled:  09/18/2012 15:00:00 Appointment Scheduled Provider:  Crawford Givens Clelia Croft) [facility])

## 2012-09-18 NOTE — Assessment & Plan Note (Signed)
Nontoxic, would hold on abx for now since lungs completely ctab and use tessalon prn for now.  She agrees.  F/u prn. Supportive care o/w.

## 2012-09-18 NOTE — Patient Instructions (Addendum)
Take tessalon for cough and then notify us if not improving.  Take care.

## 2012-09-27 ENCOUNTER — Other Ambulatory Visit: Payer: Self-pay | Admitting: Family Medicine

## 2012-10-30 ENCOUNTER — Other Ambulatory Visit: Payer: Self-pay | Admitting: Family Medicine

## 2012-10-30 DIAGNOSIS — I10 Essential (primary) hypertension: Secondary | ICD-10-CM

## 2012-10-30 DIAGNOSIS — D510 Vitamin B12 deficiency anemia due to intrinsic factor deficiency: Secondary | ICD-10-CM

## 2012-10-30 DIAGNOSIS — I5032 Chronic diastolic (congestive) heart failure: Secondary | ICD-10-CM

## 2012-11-06 ENCOUNTER — Other Ambulatory Visit (INDEPENDENT_AMBULATORY_CARE_PROVIDER_SITE_OTHER): Payer: Medicare Other

## 2012-11-06 DIAGNOSIS — I10 Essential (primary) hypertension: Secondary | ICD-10-CM

## 2012-11-06 LAB — CBC WITH DIFFERENTIAL/PLATELET
Basophils Absolute: 0 10*3/uL (ref 0.0–0.1)
Basophils Relative: 0.4 % (ref 0.0–3.0)
Eosinophils Relative: 1.4 % (ref 0.0–5.0)
HCT: 34.6 % — ABNORMAL LOW (ref 36.0–46.0)
Hemoglobin: 11.5 g/dL — ABNORMAL LOW (ref 12.0–15.0)
Lymphs Abs: 2.1 10*3/uL (ref 0.7–4.0)
MCHC: 33.3 g/dL (ref 30.0–36.0)
Monocytes Relative: 6.5 % (ref 3.0–12.0)
Neutro Abs: 4.9 10*3/uL (ref 1.4–7.7)
Neutrophils Relative %: 64 % (ref 43.0–77.0)
RBC: 3.88 Mil/uL (ref 3.87–5.11)
RDW: 14.8 % — ABNORMAL HIGH (ref 11.5–14.6)
WBC: 7.7 10*3/uL (ref 4.5–10.5)

## 2012-11-06 LAB — RENAL FUNCTION PANEL
BUN: 22 mg/dL (ref 6–23)
CO2: 27 mEq/L (ref 19–32)
Chloride: 104 mEq/L (ref 96–112)
GFR: 34.15 mL/min — ABNORMAL LOW (ref >60.00)
Potassium: 4.2 mEq/L (ref 3.5–5.1)
Sodium: 141 mEq/L (ref 135–145)

## 2012-11-07 LAB — VITAMIN D 25 HYDROXY (VIT D DEFICIENCY, FRACTURES): Vit D, 25-Hydroxy: 76 ng/mL (ref 30–89)

## 2012-11-09 ENCOUNTER — Encounter: Payer: Self-pay | Admitting: Family Medicine

## 2012-11-09 ENCOUNTER — Ambulatory Visit (INDEPENDENT_AMBULATORY_CARE_PROVIDER_SITE_OTHER): Payer: Medicare Other | Admitting: Family Medicine

## 2012-11-09 VITALS — BP 118/62 | HR 84 | Temp 98.2°F | Ht 66.0 in | Wt 122.8 lb

## 2012-11-09 DIAGNOSIS — I10 Essential (primary) hypertension: Secondary | ICD-10-CM

## 2012-11-09 DIAGNOSIS — E039 Hypothyroidism, unspecified: Secondary | ICD-10-CM

## 2012-11-09 DIAGNOSIS — Z8500 Personal history of malignant neoplasm of unspecified digestive organ: Secondary | ICD-10-CM

## 2012-11-09 DIAGNOSIS — Z23 Encounter for immunization: Secondary | ICD-10-CM

## 2012-11-09 DIAGNOSIS — R634 Abnormal weight loss: Secondary | ICD-10-CM

## 2012-11-09 DIAGNOSIS — I5032 Chronic diastolic (congestive) heart failure: Secondary | ICD-10-CM

## 2012-11-09 DIAGNOSIS — Z0000 Encounter for general adult medical examination without abnormal findings: Secondary | ICD-10-CM

## 2012-11-09 DIAGNOSIS — E785 Hyperlipidemia, unspecified: Secondary | ICD-10-CM

## 2012-11-09 MED ORDER — LEVOTHYROXINE SODIUM 25 MCG PO TABS: 25.0000 ug | ORAL_TABLET | Freq: Every day | ORAL | Status: DC

## 2012-11-09 MED ORDER — LEVOTHYROXINE SODIUM 25 MCG PO TABS: 25.0000 ug | ORAL_TABLET | ORAL | Status: DC

## 2012-11-09 NOTE — Assessment & Plan Note (Signed)
I have personally reviewed the Medicare Annual Wellness questionnaire and have noted 1. The patient's medical and social history 2. Their use of alcohol, tobacco or illicit drugs 3. Their current medications and supplements 4. The patient's functional ability including ADL's, fall risks, home safety risks and hearing or visual impairment. 5. Diet and physical activity 6. Evidence for depression or mood disorders The patients weight, height, BMI have been recorded in the chart.  Hearing and vision has been addressed. I have made referrals, counseling and provided education to the patient based review of the above and I have provided the pt with a written personalized care plan for preventive services. See scanned questionairre. Advanced directives discussed: packet provided.  "The lord will take me when he's ready"  HCPOA would be youngest son and daughter she lives with.  Reviewed preventative protocols and updated unless pt declined. Flu shot todya. Breast exam today. Trouble with memory - will ask to return for geriatric assessment.

## 2012-11-09 NOTE — Assessment & Plan Note (Signed)
Asxs. Continue iron.  Last colonoscopy 05/2010 - f/u prn Arlyce Dice)

## 2012-11-09 NOTE — Addendum Note (Signed)
Addended by: Josph Macho A on: 11/09/2012 12:01 PM   Modules accepted: Orders

## 2012-11-09 NOTE — Assessment & Plan Note (Addendum)
Longstanding - discussed supplementation - will start low dose synthroid MWF.  rtc 2 mo for lab visit.

## 2012-11-09 NOTE — Assessment & Plan Note (Signed)
Wt Readings from Last 3 Encounters:  11/09/12 122 lb 12 oz (55.679 kg)  09/18/12 126 lb 8 oz (57.38 kg)  06/30/12 131 lb (59.421 kg)  continued despite ok appetite. Advised start ensure or boost mid morning prior to lunch. Recheck netx visit. Body mass index is 19.82 kg/(m^2).

## 2012-11-09 NOTE — Assessment & Plan Note (Signed)
Asxs, stable on current regimen.

## 2012-11-09 NOTE — Patient Instructions (Addendum)
Flu shot today. Weight loss noted - I'd suggest starting ensure or boost as a mid morning snack before lunch. Call your insurance about the shingles shot to see if it is covered or how much it would cost and where is cheaper (here or pharmacy).  If you want to receive here, call for nurse visit.  Take enteric coated aspirin 81mg  once a week. Advanced directive packet provided. Thyroid function was a bit low - I recommend starting thyroid medication low dose on Monday, Wednesday, and Fridays and return in 2 months for lab visit only to recheck thyroid levels. Return in 6 months for geriatric assessment

## 2012-11-09 NOTE — Assessment & Plan Note (Signed)
Actually well controlled.  removed from problem list.

## 2012-11-09 NOTE — Assessment & Plan Note (Signed)
Chronic, stable. Continue med. 

## 2012-11-09 NOTE — Assessment & Plan Note (Signed)
Chronic ,stable. Knows to avoid NSAIDs and stay well hydrated 

## 2012-11-09 NOTE — Progress Notes (Signed)
Subjective:    Patient ID: [name], female    DOB: 1920/02/19, 79 y.o.   MRN: [id number]  HPI CC: medicare wellness visit  [name] is a pleasant [phone] with h/o diastolic CHF, HTN, and CRI stage 3 presents for medicare wellness visit.  Presents with daughter today. Weight loss noted.  Pt not alarmed.  States appetite ok.  Eats 3 meals a day.  CKD - knows to avoid NSAIDs.  Stays well hydrated. Hypothyroidism - longstanding.  Has never been on thyroid medication.  Stays cold.  Trouble with memory today - failed recall, failed calculation, does not know date, does not know president.  No memory trouble endorsed by family at home.  Preventative:  Colonoscopy 03/2010 - 1 polyp, int hemorrhoids. rec f/u with GI PRN. Arlyce Dice)  Mammogram - possibly 2009.  No new or suspicious skin spots  Flu shot - today.  Tetanus 2004 - declines rpt today. Pneumovax completed.  Shingles shot - will check with insurance.  Advanced directives: would want HCPOA to be youngest son and daughter who she lives with. Discussed advanced directive. Packet provided.  Denies depression, anxiety, sadness, anhedonia.  No falls in last year.  Passes hearing and vision screens today.    Medications and allergies reviewed and updated in chart.  Past histories reviewed and updated if relevant as below. Patient Active Problem List   Diagnosis Date Noted  . Cough 09/18/2012  . Acute bronchitis 06/30/2012  . Medicare annual wellness visit, subsequent 11/08/2011  . Weight loss 11/08/2011  . Personal history of malignant neoplasm of gastrointestinal tract 06/16/2010  . CKD (chronic kidney disease), stage III 03/04/2010  . Unspecified vitamin D deficiency 02/25/2009  . POLYP, COLON 01/09/2007  . ANEMIA, B12 DEFICIENCY 01/09/2007  . HYPERLIPIDEMIA 07/07/2006  . HYPERTENSION 07/07/2006  . Glucose intolerance (pre-diabetes) 05/25/2006  . Chronic diastolic CHF (congestive heart failure) 05/25/2006  . PERNICIOUS  ANEMIA 01/23/2006   Past Medical History  Diagnosis Date  . Hypertension 10/1998  . Hyperlipidemia 12/2002  . Diastolic CHF   . Vaginal delivery     times 4, at home  . Anemia 12/30-01/25/2006    [facility]- CHF- started lasix  . Colon cancer   . History of ETT 03/15/2006    adenosine myoview low risk   Past Surgical History  Procedure Laterality Date  . Cholecystectomy  1984  . Partial colectomy  03/25/2006    right  . Esophagogastroduodenoscopy      B9 polyp H.Pylori bx- "dilated" polyps  . History of abd  02/25/2006    bilat renal cysts, no mets  . Ct head limited w/o cm  02/2011    no acute process.  Mild to moderate cortical volume loss and scattered small vessel ischemic microangiopathy  . Colonoscopy  03/2010    1 tubular adenoma, int hemorrhoids, f/u prn Arlyce Dice)   History  Substance Use Topics  . Smoking status: Never Smoker   . Smokeless tobacco: Never Used  . Alcohol Use: No   Family History  Problem Relation Age of Onset  . Stroke Sister   . Diabetes Sister   . Hypertension Sister   . Stroke Mother   . Alcohol abuse Brother    No Known Allergies Current Outpatient Prescriptions on File Prior to Visit  Medication Sig Dispense Refill  . amLODipine (NORVASC) 5 MG tablet TAKE 1 TABLET EVERY DAY AT NIGHT  30 tablet  11  . Cholecalciferol (VITAMIN D3) 1000 UNITS CAPS Take by mouth 2 (two)  times daily.        . ferrous sulfate 325 (65 FE) MG tablet Take 325 mg by mouth 2 (two) times daily.        . furosemide (LASIX) 20 MG tablet TAKE 1/2 TABLET EVERY MORNING.  30 tablet  6  . lisinopril (PRINIVIL,ZESTRIL) 5 MG tablet TAKE 1 TABLET EVERY DAY  30 tablet  11  . vitamin B-12 (CYANOCOBALAMIN) 1000 MCG tablet Take 1,000 mcg by mouth daily.         No current facility-administered medications on file prior to visit.     Review of Systems  Constitutional: Negative for fever, chills, activity change, appetite change, fatigue and unexpected weight change.       Body mass  index is 19.82 kg/(m^2).  Wt Readings from Last 3 Encounters: 11/09/12 : 122 lb 12 oz (55.679 kg) 09/18/12 : 126 lb 8 oz (57.38 kg) 06/30/12 : 131 lb (59.421 kg)  HENT: Negative for hearing loss.   Eyes: Negative for visual disturbance.  Respiratory: Negative for cough, chest tightness, shortness of breath and wheezing.   Cardiovascular: Negative for chest pain, palpitations and leg swelling.  Gastrointestinal: Negative for nausea, vomiting, abdominal pain, diarrhea, constipation, blood in stool and abdominal distention.  Genitourinary: Negative for hematuria and difficulty urinating.  Musculoskeletal: Negative for arthralgias, myalgias and neck pain.  Skin: Negative for rash.  Neurological: Negative for dizziness, seizures, syncope and headaches.  Hematological: Negative for adenopathy. Bruises/bleeds easily.  Psychiatric/Behavioral: Negative for dysphoric mood. The patient is not nervous/anxious.        Objective:   Physical Exam  Nursing note and vitals reviewed. Constitutional: She is oriented to person, place, and time. She appears well-developed and well-nourished. No distress.  HENT:  Head: Normocephalic and atraumatic.  Right Ear: Hearing, tympanic membrane, external ear and ear canal normal.  Left Ear: Hearing, tympanic membrane, external ear and ear canal normal.  Nose: Nose normal.  Mouth/Throat: Uvula is midline, oropharynx is clear and moist and mucous membranes are normal. No oropharyngeal exudate, posterior oropharyngeal edema, posterior oropharyngeal erythema or tonsillar abscesses.  Eyes: Conjunctivae and EOM are normal. Pupils are equal, round, and reactive to light. No scleral icterus.  Neck: Normal range of motion. Neck supple. No thyromegaly present.  Cardiovascular: Normal rate, regular rhythm, normal heart sounds and intact distal pulses.   No murmur heard. Pulses:      Radial pulses are 2+ on the right side, and 2+ on the left side.  Pulmonary/Chest: Effort  normal and breath sounds normal. No respiratory distress. She has no wheezes. She has no rales. Right breast exhibits no inverted nipple, no mass, no nipple discharge, no skin change and no tenderness. Left breast exhibits no inverted nipple, no mass, no nipple discharge, no skin change and no tenderness. Breasts are symmetrical.  Abdominal: Soft. Bowel sounds are normal. She exhibits no distension and no mass. There is no tenderness. There is no rebound and no guarding.  Musculoskeletal: Normal range of motion. She exhibits no edema.  Nodules on IPs bilateral hands  Lymphadenopathy:       Head (right side): No submandibular adenopathy present.       Head (left side): No submandibular adenopathy present.    She has no cervical adenopathy.    She has no axillary adenopathy.       Right axillary: No lateral adenopathy present.       Left axillary: No lateral adenopathy present.      Right: No supraclavicular adenopathy  present.       Left: No supraclavicular adenopathy present.  Neurological: She is alert and oriented to person, place, and time.  CN grossly intact, station and gait intact  Skin: Skin is warm and dry. No rash noted.  Psychiatric: She has a normal mood and affect. Her behavior is normal. Judgment and thought content normal.       Assessment & Plan:

## 2012-11-28 ENCOUNTER — Other Ambulatory Visit: Payer: Self-pay

## 2012-11-28 DIAGNOSIS — Z1231 Encounter for screening mammogram for malignant neoplasm of breast: Secondary | ICD-10-CM

## 2012-12-05 ENCOUNTER — Other Ambulatory Visit: Payer: Self-pay | Admitting: Family Medicine

## 2012-12-06 ENCOUNTER — Telehealth: Payer: Self-pay

## 2012-12-06 NOTE — Telephone Encounter (Signed)
Onalee Hua RN case mgr with [facility] left v/m requesting recent labs and pt's plan of care.Please advise.

## 2012-12-07 NOTE — Telephone Encounter (Signed)
May send labs and last office note.

## 2012-12-08 NOTE — Telephone Encounter (Signed)
Message left for Onalee Hua to return my call and provide fax #.

## 2012-12-08 NOTE — Telephone Encounter (Signed)
Spoke with Onalee Hua. Info faxed to [phone].

## 2013-01-02 ENCOUNTER — Ambulatory Visit
Admission: RE | Admit: 2013-01-02 | Discharge: 2013-01-02 | Disposition: A | Discharge status: Home / Self Care | Payer: Medicare Other | Source: Ambulatory Visit

## 2013-01-02 DIAGNOSIS — Z1231 Encounter for screening mammogram for malignant neoplasm of breast: Secondary | ICD-10-CM

## 2013-01-03 ENCOUNTER — Encounter: Payer: Self-pay | Admitting: *Deleted

## 2013-01-09 ENCOUNTER — Other Ambulatory Visit (INDEPENDENT_AMBULATORY_CARE_PROVIDER_SITE_OTHER): Payer: Medicare Other

## 2013-01-09 DIAGNOSIS — E039 Hypothyroidism, unspecified: Secondary | ICD-10-CM

## 2013-01-09 LAB — T3, FREE: T3, Free: 2.8 pg/mL (ref 2.3–4.2)

## 2013-01-09 LAB — TSH: TSH: 6.07 u[IU]/mL — ABNORMAL HIGH (ref 0.35–5.50)

## 2013-01-12 ENCOUNTER — Other Ambulatory Visit: Payer: Self-pay | Admitting: Family Medicine

## 2013-01-12 DIAGNOSIS — E039 Hypothyroidism, unspecified: Secondary | ICD-10-CM

## 2013-01-12 MED ORDER — LEVOTHYROXINE SODIUM 25 MCG PO TABS: 25.0000 ug | ORAL_TABLET | ORAL | Status: DC

## 2013-03-19 ENCOUNTER — Other Ambulatory Visit: Payer: Self-pay | Admitting: Family Medicine

## 2013-03-23 ENCOUNTER — Telehealth: Payer: Self-pay

## 2013-03-23 ENCOUNTER — Encounter: Payer: Self-pay | Admitting: Family Medicine

## 2013-03-23 ENCOUNTER — Ambulatory Visit (INDEPENDENT_AMBULATORY_CARE_PROVIDER_SITE_OTHER): Payer: Medicare Other | Admitting: Family Medicine

## 2013-03-23 VITALS — BP 110/60 | HR 82 | Temp 98.2°F | Ht 66.0 in | Wt 126.0 lb

## 2013-03-23 DIAGNOSIS — N939 Abnormal uterine and vaginal bleeding, unspecified: Secondary | ICD-10-CM

## 2013-03-23 DIAGNOSIS — R319 Hematuria, unspecified: Secondary | ICD-10-CM | POA: Insufficient documentation

## 2013-03-23 DIAGNOSIS — Z8500 Personal history of malignant neoplasm of unspecified digestive organ: Secondary | ICD-10-CM

## 2013-03-23 DIAGNOSIS — N898 Other specified noninflammatory disorders of vagina: Secondary | ICD-10-CM

## 2013-03-23 LAB — POCT URINALYSIS DIPSTICK
Bilirubin, UA: NEGATIVE
GLUCOSE UA: NEGATIVE
KETONES UA: NEGATIVE
Nitrite, UA: NEGATIVE
PH UA: 6
Spec Grav, UA: 1.015
Urobilinogen, UA: 0.2

## 2013-03-23 MED ORDER — CIPROFLOXACIN HCL 250 MG PO TABS: 250.0000 mg | ORAL_TABLET | Freq: Two times a day (BID) | ORAL | Status: DC

## 2013-03-23 NOTE — Progress Notes (Signed)
Pre visit review using our clinic review tool, if applicable. No additional management support is needed unless otherwise documented below in the visit note. 

## 2013-03-23 NOTE — Patient Instructions (Signed)
Start and complete antibiotics x 7 days.  We will call with culture results.  Push fluids.  Call if fever or bleeding continuing.

## 2013-03-23 NOTE — Progress Notes (Signed)
Subjective:    Patient ID: [name], female    DOB: 1920-04-06, 80 y.o.   MRN: [id number]  Vaginal Bleeding Pertinent negatives include no abdominal pain, dysuria or fever.    80 year old female pt of Dr. Synthia Innocent with history of  HTN, B12 def anemia, CKD, hypothyroid and CHF presents with new onset  bright red blood on panties in last few weeks. No fatigue, no pain in abdomen. No fever. No dysuria, no change in frequency or urgency. No constipation or diarrhea, no rectal pain.   She takes 1 baby aspirin daily.  No history of uterine cancer  Hx of colon cancer, status post resection in 2008 One polyp in 2012 on repeat colonoscopy Dr. Deatra Ina   Review of Systems  Constitutional: Negative for fever and fatigue.  HENT: Negative for ear pain.   Eyes: Negative for pain.  Respiratory: Negative for chest tightness and shortness of breath.   Cardiovascular: Negative for chest pain, palpitations and leg swelling.  Gastrointestinal: Negative for abdominal pain.  Genitourinary: Positive for vaginal bleeding. Negative for dysuria.       Objective:   Physical Exam  Constitutional: Vital signs are normal. She appears well-developed and well-nourished. She is cooperative.  Non-toxic appearance. She does not appear ill. No distress.  Elderly female thin in NAD  HENT:  Head: Normocephalic.  Right Ear: Hearing, tympanic membrane, external ear and ear canal normal. Tympanic membrane is not erythematous, not retracted and not bulging.  Left Ear: Hearing, tympanic membrane, external ear and ear canal normal. Tympanic membrane is not erythematous, not retracted and not bulging.  Nose: No mucosal edema or rhinorrhea. Right sinus exhibits no maxillary sinus tenderness and no frontal sinus tenderness. Left sinus exhibits no maxillary sinus tenderness and no frontal sinus tenderness.  Mouth/Throat: Uvula is midline, oropharynx is clear and moist and mucous membranes are normal.  Eyes:  Conjunctivae, EOM and lids are normal. Pupils are equal, round, and reactive to light. Lids are everted and swept, no foreign bodies found.  Neck: Trachea normal and normal range of motion. Neck supple. Carotid bruit is not present. No mass and no thyromegaly present.  Cardiovascular: Normal rate, regular rhythm, S1 normal, S2 normal, normal heart sounds, intact distal pulses and normal pulses.  Exam reveals no gallop and no friction rub.   No murmur heard. Pulmonary/Chest: Effort normal and breath sounds normal. Not tachypneic. No respiratory distress. She has no decreased breath sounds. She has no wheezes. She has no rhonchi. She has no rales.  Abdominal: Soft. Normal appearance and bowel sounds are normal. There is no tenderness.  Genitourinary: Vagina normal and uterus normal. Rectal exam shows no external hemorrhoid, no fissure, no mass, no tenderness and anal tone normal. Guaiac negative stool. No labial fusion. There is no rash, tenderness, lesion or injury on the right labia. There is no rash, tenderness, lesion or injury on the left labia. Right adnexum displays no mass. No erythema, tenderness or bleeding around the vagina. No signs of injury around the vagina. No vaginal discharge found.  Neurological: She is alert.  Skin: Skin is warm, dry and intact. No rash noted.  Psychiatric: Her speech is normal and behavior is normal. Judgment and thought content normal. Her mood appears not anxious. Cognition and memory are normal. She does not exhibit a depressed mood.          Assessment & Plan:    Blood and leukocytes seen on UA and micro... likely UTI . This  is likely cause of blood in panties.  Send  Urine for culture.  On exam... hemeoccult was negative, nml  rectal exam  External vaginal exam showed  No source of blood

## 2013-03-23 NOTE — Telephone Encounter (Signed)
Noted  

## 2013-03-23 NOTE — Telephone Encounter (Signed)
For one week on and off pt has had what pts daughter thinks is vaginal spotting.See spotting in panties; no abd pain, no fever. Dot will cb if pt condition changes or worsens prior to appt. Pt scheduled to see Dr Diona Browner today at 3 pm

## 2013-03-25 DIAGNOSIS — R3129 Other microscopic hematuria: Secondary | ICD-10-CM

## 2013-03-25 HISTORY — DX: Other microscopic hematuria: R31.29

## 2013-03-26 LAB — URINE CULTURE: Colony Count: 50000

## 2013-04-02 ENCOUNTER — Telehealth: Payer: Self-pay | Admitting: Family Medicine

## 2013-04-02 ENCOUNTER — Ambulatory Visit (INDEPENDENT_AMBULATORY_CARE_PROVIDER_SITE_OTHER): Payer: Medicare Other | Admitting: Family Medicine

## 2013-04-02 ENCOUNTER — Encounter: Payer: Self-pay | Admitting: Family Medicine

## 2013-04-02 VITALS — BP 118/78 | HR 84 | Temp 97.4°F | Wt 127.0 lb

## 2013-04-02 DIAGNOSIS — R319 Hematuria, unspecified: Secondary | ICD-10-CM

## 2013-04-02 LAB — POCT URINALYSIS DIPSTICK
Bilirubin, UA: NEGATIVE
Glucose, UA: NEGATIVE
Ketones, UA: NEGATIVE
Nitrite, UA: NEGATIVE
Spec Grav, UA: 1.02
Urobilinogen, UA: 0.2
pH, UA: 6

## 2013-04-02 MED ORDER — CEPHALEXIN 500 MG PO CAPS: 500.0000 mg | ORAL_CAPSULE | Freq: Two times a day (BID) | ORAL | Status: DC

## 2013-04-02 NOTE — Telephone Encounter (Signed)
Pt's sister is calling and says that Ms. Sealey is still passing blood in her urine and wasn't sure if she needed to come back in or if something else could be prescribed to her. She has taken an antibiotic for 7 days and it is not working. Please advise.

## 2013-04-02 NOTE — Progress Notes (Signed)
BP 118/78  Pulse 84  Temp(Src) 97.4 F (36.3 C) (Oral)  Wt 127 lb (57.607 kg)   CC: GU bleeding  Subjective:    Patient ID: [name], female    DOB: 10-Aug-1920, 80 y.o.   MRN: [id number]  HPI: [name] is a 80 y.o. female presenting on 04/02/2013 with Hematuria   [name] was seen by my partner Dr. Diona Browner on 03/23/2013 with concern for UTI causing hematuria.  UA showing blood and leukocytes.  Urine was cultured: 50k klebisella sensitive to cipro.  She was treated with 7 day course of cipro 250mg  bid. Finished abx on Friday. At that time, pelvic and rectal exam were normal, guaiac negative. Pt denies abd or flank pain, nausea, vomiting, dysuria, urgency, frequency.  No hematuria.   Bleeding is improving but still present.  Daughter was worred she may need more antibiotic.  Noted on underwear and occasionally when wiping.    She takes 1 baby aspirin daily.  No history of uterine cancer  Hx of colon cancer, status post resection in 2008  One polyp in 2012 on repeat colonoscopy Dr. Deatra Ina   Relevant past medical, surgical, family and social history reviewed and updated as indicated.  Allergies and medications reviewed and updated. Current Outpatient Prescriptions on File Prior to Visit  Medication Sig  . amLODipine (NORVASC) 5 MG tablet TAKE 1 TABLET EVERY EVENING  . aspirin EC 81 MG tablet Take 81 mg by mouth daily.   . beta carotene w/minerals (OCUVITE) tablet Take 1 tablet by mouth daily.  . Cholecalciferol (VITAMIN D3) 1000 UNITS CAPS Take by mouth 2 (two) times daily.    . ferrous sulfate 325 (65 FE) MG tablet Take 325 mg by mouth 2 (two) times daily.    . furosemide (LASIX) 20 MG tablet TAKE 1/2 TABLET EVERY MORNING.  Marland Kitchen levothyroxine (LEVOTHROID) 25 MCG tablet Take 1 tablet (25 mcg total) by mouth as directed. On Monday through Friday, hold on weekends, take before breakfast.  . lisinopril (PRINIVIL,ZESTRIL) 5 MG tablet TAKE 1 TABLET EVERY DAY  . vitamin B-12  (CYANOCOBALAMIN) 1000 MCG tablet Take 1,000 mcg by mouth daily.    . ciprofloxacin (CIPRO) 250 MG tablet Take 1 tablet (250 mg total) by mouth 2 (two) times daily.   No current facility-administered medications on file prior to visit.    Review of Systems Per HPI unless specifically indicated above    Objective:    BP 118/78  Pulse 84  Temp(Src) 97.4 F (36.3 C) (Oral)  Wt 127 lb (57.607 kg)  Physical Exam  Nursing note and vitals reviewed. Constitutional: She appears well-developed and well-nourished. No distress.  Abdominal: Soft. Normal appearance and bowel sounds are normal. She exhibits no distension and no mass. There is no hepatosplenomegaly. There is no tenderness. There is no rigidity, no rebound, no guarding, no CVA tenderness and negative Murphy's sign.  Skin: Skin is warm and dry. No rash noted.  Psychiatric: She has a normal mood and affect.   Results for orders placed in visit on 04/02/13  POCT URINALYSIS DIPSTICK      Result Value Ref Range   Color, UA Yellow     Clarity, UA Cloudy     Glucose, UA Negative     Bilirubin, UA Negative     Ketones, UA Negative     Spec Grav, UA 1.020     Blood, UA Large     pH, UA 6.0     Protein, UA Trace  Urobilinogen, UA 0.2     Nitrite, UA Negative     Leukocytes, UA large (3+)        Assessment & Plan:   Problem List Items Addressed This Visit   Hematuria - Primary     Persistent gross hematuria however no significant UTI sxs.   ?asxs bacteriuria w/ colonization. Will treat with another 1wk course of abx (keflex) and then reassess urine. If persistent hematuria, will refer to urology. Pt agrees with plan.  I've asked her to return in 1 wk for rpt urine (send to lab for UA, micro, and culture).    Relevant Orders      POCT Urinalysis Dipstick (Completed)       Follow up plan: Return if symptoms worsen or fail to improve.

## 2013-04-02 NOTE — Telephone Encounter (Signed)
Appt scheduled

## 2013-04-02 NOTE — Progress Notes (Signed)
Pre visit review using our clinic review tool, if applicable. No additional management support is needed unless otherwise documented below in the visit note. 

## 2013-04-02 NOTE — Patient Instructions (Addendum)
Let's do another course of antibiotic (keflex 500mg  twice daily for 1 week), then return next week to drop off urine to send for culture and micro. If persistent blood or persistent infection, we will set you up with a urologist to see why infection or blood isn't clearing. Good to see you today, call us with questions

## 2013-04-02 NOTE — Telephone Encounter (Signed)
plz schedule office visit for f/u.

## 2013-04-02 NOTE — Assessment & Plan Note (Signed)
Persistent gross hematuria however no significant UTI sxs.   ?asxs bacteriuria w/ colonization. Will treat with another 1wk course of abx (keflex) and then reassess urine. If persistent hematuria, will refer to urology. Pt agrees with plan.  I've asked her to return in 1 wk for rpt urine (send to lab for UA, micro, and culture).

## 2013-04-03 ENCOUNTER — Ambulatory Visit: Payer: Medicare Other | Admitting: Family Medicine

## 2013-04-03 ENCOUNTER — Other Ambulatory Visit: Payer: Self-pay | Admitting: Family Medicine

## 2013-04-03 DIAGNOSIS — N390 Urinary tract infection, site not specified: Secondary | ICD-10-CM

## 2013-04-09 ENCOUNTER — Other Ambulatory Visit (INDEPENDENT_AMBULATORY_CARE_PROVIDER_SITE_OTHER): Payer: Medicare Other

## 2013-04-09 DIAGNOSIS — E039 Hypothyroidism, unspecified: Secondary | ICD-10-CM

## 2013-04-09 DIAGNOSIS — N390 Urinary tract infection, site not specified: Secondary | ICD-10-CM

## 2013-04-09 LAB — TSH: TSH: 3.43 u[IU]/mL (ref 0.35–5.50)

## 2013-04-09 LAB — URINALYSIS, ROUTINE W REFLEX MICROSCOPIC
BILIRUBIN URINE: NEGATIVE
KETONES UR: NEGATIVE
NITRITE: NEGATIVE
PH: 6.5 (ref 5.0–8.0)
Specific Gravity, Urine: 1.01 (ref 1.000–1.030)
Total Protein, Urine: NEGATIVE
URINE GLUCOSE: NEGATIVE
Urobilinogen, UA: 0.2 (ref 0.0–1.0)

## 2013-04-10 LAB — URINE CULTURE
Colony Count: NO GROWTH
Organism ID, Bacteria: NO GROWTH

## 2013-04-11 ENCOUNTER — Other Ambulatory Visit: Payer: Self-pay | Admitting: Family Medicine

## 2013-04-11 DIAGNOSIS — R319 Hematuria, unspecified: Secondary | ICD-10-CM

## 2013-04-24 ENCOUNTER — Encounter: Payer: Self-pay | Admitting: Gastroenterology

## 2013-04-26 ENCOUNTER — Encounter: Payer: Self-pay | Admitting: Family Medicine

## 2013-05-10 ENCOUNTER — Ambulatory Visit: Payer: Medicare Other | Admitting: Family Medicine

## 2013-05-22 ENCOUNTER — Encounter: Payer: Self-pay | Admitting: Family Medicine

## 2013-05-22 ENCOUNTER — Ambulatory Visit (INDEPENDENT_AMBULATORY_CARE_PROVIDER_SITE_OTHER): Payer: Medicare Other | Admitting: Family Medicine

## 2013-05-22 VITALS — BP 134/62 | HR 92 | Temp 97.5°F | Wt 125.5 lb

## 2013-05-22 DIAGNOSIS — F0390 Unspecified dementia without behavioral disturbance: Secondary | ICD-10-CM | POA: Insufficient documentation

## 2013-05-22 DIAGNOSIS — R413 Other amnesia: Secondary | ICD-10-CM

## 2013-05-22 DIAGNOSIS — Z2911 Encounter for prophylactic immunotherapy for respiratory syncytial virus (RSV): Secondary | ICD-10-CM

## 2013-05-22 DIAGNOSIS — R319 Hematuria, unspecified: Secondary | ICD-10-CM

## 2013-05-22 DIAGNOSIS — Z23 Encounter for immunization: Secondary | ICD-10-CM

## 2013-05-22 NOTE — Assessment & Plan Note (Addendum)
MMSE today 13/30 (04/2013).  Independent ADLs, dependent IADLs. Anticipate senile dementia.  Discussed this.  Will monitor for now. A total of 25 minutes were spent face-to-face with the patient during this encounter and over half of that time was spent on counseling and coordination of care

## 2013-05-22 NOTE — Progress Notes (Signed)
Pre visit review using our clinic review tool, if applicable. No additional management support is needed unless otherwise documented below in the visit note. 

## 2013-05-22 NOTE — Assessment & Plan Note (Addendum)
Agrees to watchful waiting with urology. No longer gross hematuria, will notify Dr Matilde Sprang if gross hematuria returns.

## 2013-05-22 NOTE — Addendum Note (Signed)
Addended by: Royann Shivers A on: 05/22/2013 04:21 PM   Modules accepted: Orders

## 2013-05-22 NOTE — Patient Instructions (Signed)
There is some trouble with your memory - may be part of normal aging process.  No changes today. We can watch this for now.  Let me know if any worsening memory trouble noticed. Shingles shot today.

## 2013-05-22 NOTE — Progress Notes (Signed)
BP 134/62  Pulse 92  Temp(Src) 97.5 F (36.4 C) (Oral)  Wt 125 lb 8 oz (56.926 kg)   CC: geriatric assessment  Subjective:    Patient ID: [name], female    DOB: 1920/05/06, 80 y.o.   MRN: [id number]  HPI: CHIYEKO Cook is a 80 y.o. female presenting on 05/22/2013 for Geriatric Assessment   [name] is a pleasant 87ID with h/o diastolic CHF, HTN, and CRI stage 3   See prior note for details - briefly, seen here with gross hematuria, treated as UTI although UCx unrevealing.  Referred to urology, decided on watchful waiting. Trouble with memory noted last wellness exam - failed recall, failed calculation, does not know date, does not know president. No memory trouble endorsed by family at home.  Presents today for geriatric assessment.  Denies trouble with balance.  Denies urinary incontinence.  Denies behavior changes or increased anger or irritability.  Wt Readings from Last 3 Encounters:  05/22/13 125 lb 8 oz (56.926 kg)  04/02/13 127 lb (57.607 kg)  03/23/13 126 lb (57.153 kg)  Body mass index is 20.27 kg/(m^2).  Geriatric Assessment:  Activities of Daily Living:     Bathing- independent    Dressing- independent    Eating- independent    Toileting- independent    Transferring- independent    Continence- independent Overall Assessment: independent  Instrumental Activities of Daily Living:     Transportation- dependent    Meal/Food Preparation- dependent    Shopping Errands- dependent    Housekeeping/Chores- dependent    Money Management/Finances- dependent    Medication Management- partially dependent (pill box filled for her)    Ability to Use Telephone- dependent    Laundry- dependent Overall Assessment: dependent  Mental Status Exam:   13/30.  Missed 5 orientation, 5 attention/calculation, 3 recall, and 4 language.    ~8th grade education Clock Drawing Score: 3/4      Relevant past medical, surgical, family and social history reviewed and  updated as indicated.  Allergies and medications reviewed and updated. Current Outpatient Prescriptions on File Prior to Visit  Medication Sig  . amLODipine (NORVASC) 5 MG tablet TAKE 1 TABLET EVERY EVENING  . aspirin EC 81 MG tablet Take 81 mg by mouth daily.   . beta carotene w/minerals (OCUVITE) tablet Take 1 tablet by mouth daily.  . Cholecalciferol (VITAMIN D3) 1000 UNITS CAPS Take by mouth 2 (two) times daily.    . ferrous sulfate 325 (65 FE) MG tablet Take 325 mg by mouth 2 (two) times daily.    . furosemide (LASIX) 20 MG tablet TAKE 1/2 TABLET EVERY MORNING.  Marland Kitchen levothyroxine (LEVOTHROID) 25 MCG tablet Take 1 tablet (25 mcg total) by mouth as directed. On Monday through Friday, hold on weekends, take before breakfast.  . lisinopril (PRINIVIL,ZESTRIL) 5 MG tablet TAKE 1 TABLET EVERY DAY  . vitamin B-12 (CYANOCOBALAMIN) 1000 MCG tablet Take 1,000 mcg by mouth daily.     No current facility-administered medications on file prior to visit.    Review of Systems Per HPI unless specifically indicated above    Objective:    BP 134/62  Pulse 92  Temp(Src) 97.5 F (36.4 C) (Oral)  Wt 125 lb 8 oz (56.926 kg)  Physical Exam  Nursing note and vitals reviewed. Constitutional: She appears well-developed and well-nourished. No distress.  Cooperative with questions, responsive   Neurological:  Somewhat unstable/delayed get up and go test  Psychiatric: She has a normal mood and affect.  Results for orders placed in visit on 04/09/13  URINE CULTURE      Result Value Ref Range   Colony Count NO GROWTH     Organism ID, Bacteria NO GROWTH    URINALYSIS, ROUTINE W REFLEX MICROSCOPIC      Result Value Ref Range   Color, Urine YELLOW  Yellow;Lt. Yellow   APPearance CLEAR  Clear   Specific Gravity, Urine 1.010  1.000-1.030   pH 6.5  5.0 - 8.0   Total Protein, Urine NEGATIVE  Negative   Urine Glucose NEGATIVE  Negative   Ketones, ur NEGATIVE  Negative   Bilirubin Urine NEGATIVE   Negative   Hgb urine dipstick LARGE (*) Negative   Urobilinogen, UA 0.2  0.0 - 1.0   Leukocytes, UA LARGE (*) Negative   Nitrite NEGATIVE  Negative   WBC, UA 21-50/hpf (*) 0-2/hpf   RBC / HPF 3-6/hpf (*) 0-2/hpf   Mucus, UA Presence of (*) None   Squamous Epithelial / LPF Rare(0-4/hpf)  Rare(0-4/hpf)  TSH      Result Value Ref Range   TSH 3.43  0.35 - 5.50 uIU/mL      Assessment & Plan:   Problem List Items Addressed This Visit   Hematuria     Agrees to watchful waiting with urology. No longer gross hematuria, will notify Dr Matilde Sprang if gross hematuria returns.      Memory impairment - Primary     MMSE today 13/30 (04/2013).  Independent ADLs, dependent IADLs. Anticipate senile dementia.  Discussed this.  Will monitor for now. A total of 25 minutes were spent face-to-face with the patient during this encounter and over half of that time was spent on counseling and coordination of care        Follow up plan: Return in about 6 months (around 11/21/2013), or if symptoms worsen or fail to improve, for wellness exam.

## 2013-07-06 ENCOUNTER — Ambulatory Visit (INDEPENDENT_AMBULATORY_CARE_PROVIDER_SITE_OTHER): Payer: Medicare Other | Admitting: Family Medicine

## 2013-07-06 ENCOUNTER — Encounter: Payer: Self-pay | Admitting: Family Medicine

## 2013-07-06 VITALS — BP 110/52 | HR 78 | Temp 97.4°F | Ht 66.0 in | Wt 128.0 lb

## 2013-07-06 DIAGNOSIS — S41109A Unspecified open wound of unspecified upper arm, initial encounter: Secondary | ICD-10-CM

## 2013-07-06 DIAGNOSIS — S41111A Laceration without foreign body of right upper arm, initial encounter: Secondary | ICD-10-CM

## 2013-07-06 DIAGNOSIS — S41112A Laceration without foreign body of left upper arm, initial encounter: Secondary | ICD-10-CM

## 2013-07-06 DIAGNOSIS — Z23 Encounter for immunization: Secondary | ICD-10-CM

## 2013-07-06 NOTE — Progress Notes (Signed)
[place] [address] Phone: [phone] Fax: [phone]  Patient ID: [name] MRN: [id number], DOB: 13-Mar-1920, 80 y.o. Date of Encounter: 07/06/2013  Primary Physician:  Ria Bush, MD   Chief Complaint: Fall and Extremity Laceration   Subjective:   History of Present Illness:  [name] is a 80 y.o. very pleasant female patient who presents with the following:  Update Td.  Very pleasant, elderly 80 year old who fell today and struck both of her arms causing significant lacerations. This is the 1st fall that she has had in more than a year. She lives with one of her daughters. She is accompanied by one of her other daughters, and both provide history. She generally feels fairly well right now, is in minimal pain.  B lacerations, R > L Small one on R hand.  Dermabond closure.  5 cm lesion on the R 6 cm lesion on the left Small hand lesion on the R, 1.5 cm  The patient had achieved good hemostasis by the time she had arrived in the office. No significant tenderness regarding her bones, hands, or arms. Her movement at the elbow and shoulders his without difficulty.  Past Medical History, Surgical History, Social History, Family History, Problem List, Medications, and Allergies have been reviewed and updated if relevant.  Review of Systems:  GEN: No acute illnesses, no fevers, chills. GI: No n/v/d, eating normally Pulm: No SOB Interactive and getting along well at home.  Otherwise, ROS is as per the HPI.  Objective:   Physical Examination: BP 110/52  Pulse 78  Temp(Src) 97.4 F (36.3 C) (Oral)  Ht 5\' 6"  (1.676 m)  Wt 128 lb (58.06 kg)  BMI 20.67 kg/m2   GEN: WDWN, NAD, Non-toxic, Alert & Oriented x 3 HEENT: Atraumatic, Normocephalic.  Ears and Nose: No external deformity. EXTR: No clubbing/cyanosis/edema NEURO: Normal gait.  PSYCH: Normally interactive. Conversant. Not depressed or anxious appearing.  Calm demeanor.    Skin: On the LEFT arm there is a relatively superficial laceration with a flap on the superior portion, and this measures approximately 6 cm. On the RIGHT side, there is a somewhat deeper laceration in an arc flap of skin. This measures 5 cm. On the patient's hand there is a 1.5 cm lesion as well which is a triangular flap of tissue.  The patient is intact from a neurovascular standpoint.  Laboratory and Imaging Data:  Assessment & Plan:   Laceration of arm, right, multiple sites  Need for prophylactic vaccination with combined diphtheria-tetanus-pertussis (DTP) vaccine - Plan: Td vaccine greater than or equal to 7yo preservative free IM  Lacerations of multiple sites of left arm  In this 80 year old, to me her skin appeared quite fragile. I had her PCP Dr. Darnell Level inspect as well. I was concerned that if I tried to do a traditional repair with the suture that I would pull through the patient's skin, and causing even worse outcome, worse topical appearance, and may impair her healing.  I felt like this was a good case for a Dermabond closure. The patient is relatively not active, and she does read, and also does some puzzles. She doesn't do any kind of activity around the house, and she doesn't do any cooking anymore.  LAC Repair: Superficial Closure, R and L arms PLAN:  Wound was closed with Dermabond achieving a satisfactory alignment of tissue edge approximation on both the right and left sides. The smaller lesion on the R hand  was also closed. Total length of wounds with all 3 combined is 12.5 cm. in length. Observe for any signs of infection or other problems.    New Prescriptions   No medications on file   Modified Medications   No medications on file   Orders Placed This Encounter  Procedures  . Td vaccine greater than or equal to 7yo preservative free IM   Follow-up: No Follow-up on file. Unless noted above, the patient is to follow-up if symptoms worsen. Red flags were  reviewed with the patient.  Signed,  Maud Deed. Shanoah Asbill, MD, CA[facility] Sports Medicine   Discontinued Medications   No medications on file   Current Medications at Discharge:   Medication List       This list is accurate as of: 07/06/13 11:59 PM.  Always use your most recent med list.               amLODipine 5 MG tablet  Commonly known as:  NORVASC  TAKE 1 TABLET EVERY EVENING     aspirin EC 81 MG tablet  Take 81 mg by mouth daily.     beta carotene w/minerals tablet  Take 1 tablet by mouth daily.     ferrous sulfate 325 (65 FE) MG tablet  Take 325 mg by mouth 2 (two) times daily.     furosemide 20 MG tablet  Commonly known as:  LASIX  TAKE 1/2 TABLET EVERY MORNING.     levothyroxine 25 MCG tablet  Commonly known as:  LEVOTHROID  Take 1 tablet (25 mcg total) by mouth as directed. On Monday through Friday, hold on weekends, take before breakfast.     lisinopril 5 MG tablet  Commonly known as:  PRINIVIL,ZESTRIL  TAKE 1 TABLET EVERY DAY     vitamin B-12 1000 MCG tablet  Commonly known as:  CYANOCOBALAMIN  Take 1,000 mcg by mouth daily.     Vitamin D3 1000 UNITS Caps  Take by mouth 2 (two) times daily.

## 2013-07-06 NOTE — Progress Notes (Signed)
Pre visit review using our clinic review tool, if applicable. No additional management support is needed unless otherwise documented below in the visit note. 

## 2013-08-14 ENCOUNTER — Other Ambulatory Visit: Payer: Self-pay | Admitting: Family Medicine

## 2013-09-03 ENCOUNTER — Telehealth: Payer: Self-pay

## 2013-09-03 NOTE — Telephone Encounter (Signed)
Carita Pian RN case mgr with [facility] left v/m requesting medical records and does pt have diabetes type 2. Left v/m requesting request for records and questions faxed to Dr Synthia Innocent office at [phone].

## 2013-11-03 ENCOUNTER — Other Ambulatory Visit: Payer: Self-pay | Admitting: Family Medicine

## 2013-11-03 ENCOUNTER — Encounter: Payer: Self-pay | Admitting: Family Medicine

## 2013-11-03 DIAGNOSIS — E039 Hypothyroidism, unspecified: Secondary | ICD-10-CM

## 2013-11-03 DIAGNOSIS — N183 Chronic kidney disease, stage 3 unspecified: Secondary | ICD-10-CM

## 2013-11-03 DIAGNOSIS — I10 Essential (primary) hypertension: Secondary | ICD-10-CM

## 2013-11-03 DIAGNOSIS — I5032 Chronic diastolic (congestive) heart failure: Secondary | ICD-10-CM

## 2013-11-06 ENCOUNTER — Other Ambulatory Visit (INDEPENDENT_AMBULATORY_CARE_PROVIDER_SITE_OTHER): Payer: Medicare Other

## 2013-11-06 DIAGNOSIS — N183 Chronic kidney disease, stage 3 unspecified: Secondary | ICD-10-CM

## 2013-11-06 DIAGNOSIS — I10 Essential (primary) hypertension: Secondary | ICD-10-CM

## 2013-11-06 DIAGNOSIS — E039 Hypothyroidism, unspecified: Secondary | ICD-10-CM

## 2013-11-06 LAB — RENAL FUNCTION PANEL
Albumin: 3.2 g/dL — ABNORMAL LOW (ref 3.5–5.2)
BUN: 29 mg/dL — ABNORMAL HIGH (ref 6–23)
CO2: 24 mEq/L (ref 19–32)
CREATININE: 1.5 mg/dL — AB (ref 0.4–1.2)
Calcium: 9.9 mg/dL (ref 8.4–10.5)
Chloride: 107 mEq/L (ref 96–112)
GFR: 35.42 mL/min — ABNORMAL LOW (ref >60.00)
GLUCOSE: 94 mg/dL (ref 70–99)
POTASSIUM: 4.5 meq/L (ref 3.5–5.1)
Phosphorus: 3.6 mg/dL (ref 2.3–4.6)
Sodium: 140 mEq/L (ref 135–145)

## 2013-11-06 LAB — LIPID PANEL
Cholesterol: 155 mg/dL (ref 0–200)
HDL: 53.3 mg/dL (ref >39.00)
LDL CALC: 90 mg/dL (ref 0–99)
NONHDL: 101.7
Total CHOL/HDL Ratio: 3
Triglycerides: 58 mg/dL (ref 0.0–149.0)
VLDL: 11.6 mg/dL (ref 0.0–40.0)

## 2013-11-06 LAB — CBC WITH DIFFERENTIAL/PLATELET
BASOS ABS: 0 10*3/uL (ref 0.0–0.1)
Basophils Relative: 0.6 % (ref 0.0–3.0)
Eosinophils Absolute: 0.2 10*3/uL (ref 0.0–0.7)
Eosinophils Relative: 2.9 % (ref 0.0–5.0)
HEMATOCRIT: 36.8 % (ref 36.0–46.0)
Hemoglobin: 12.2 g/dL (ref 12.0–15.0)
LYMPHS ABS: 2.2 10*3/uL (ref 0.7–4.0)
Lymphocytes Relative: 37 % (ref 12.0–46.0)
MCHC: 33.1 g/dL (ref 30.0–36.0)
MCV: 94.3 fl (ref 78.0–100.0)
MONO ABS: 0.4 10*3/uL (ref 0.1–1.0)
Monocytes Relative: 6.8 % (ref 3.0–12.0)
Neutro Abs: 3.1 10*3/uL (ref 1.4–7.7)
Neutrophils Relative %: 52.7 % (ref 43.0–77.0)
PLATELETS: 131 10*3/uL — AB (ref 150.0–400.0)
RBC: 3.9 Mil/uL (ref 3.87–5.11)
RDW: 13.1 % (ref 11.5–15.5)
WBC: 5.9 10*3/uL (ref 4.0–10.5)

## 2013-11-06 LAB — TSH: TSH: 3.21 u[IU]/mL (ref 0.35–4.50)

## 2013-11-08 ENCOUNTER — Other Ambulatory Visit: Payer: Self-pay | Admitting: Family Medicine

## 2013-11-12 ENCOUNTER — Ambulatory Visit (INDEPENDENT_AMBULATORY_CARE_PROVIDER_SITE_OTHER): Payer: Medicare Other | Admitting: Family Medicine

## 2013-11-12 ENCOUNTER — Encounter: Payer: Self-pay | Admitting: Family Medicine

## 2013-11-12 VITALS — BP 122/56 | HR 96 | Temp 97.7°F | Ht 66.0 in | Wt 128.5 lb

## 2013-11-12 DIAGNOSIS — D510 Vitamin B12 deficiency anemia due to intrinsic factor deficiency: Secondary | ICD-10-CM

## 2013-11-12 DIAGNOSIS — Z0000 Encounter for general adult medical examination without abnormal findings: Secondary | ICD-10-CM

## 2013-11-12 DIAGNOSIS — Z7189 Other specified counseling: Secondary | ICD-10-CM

## 2013-11-12 DIAGNOSIS — E039 Hypothyroidism, unspecified: Secondary | ICD-10-CM

## 2013-11-12 DIAGNOSIS — I10 Essential (primary) hypertension: Secondary | ICD-10-CM

## 2013-11-12 DIAGNOSIS — R634 Abnormal weight loss: Secondary | ICD-10-CM

## 2013-11-12 DIAGNOSIS — N183 Chronic kidney disease, stage 3 unspecified: Secondary | ICD-10-CM

## 2013-11-12 DIAGNOSIS — I5032 Chronic diastolic (congestive) heart failure: Secondary | ICD-10-CM

## 2013-11-12 DIAGNOSIS — D518 Other vitamin B12 deficiency anemias: Secondary | ICD-10-CM

## 2013-11-12 DIAGNOSIS — R413 Other amnesia: Secondary | ICD-10-CM

## 2013-11-12 DIAGNOSIS — Z23 Encounter for immunization: Secondary | ICD-10-CM

## 2013-11-12 NOTE — Assessment & Plan Note (Signed)
No longer anemic. Compliant with b12 supplement daily. Lab Results  Component Value Date   [id number] 263 11/01/2011

## 2013-11-12 NOTE — Assessment & Plan Note (Signed)

## 2013-11-12 NOTE — Progress Notes (Signed)
Pre visit review using our clinic review tool, if applicable. No additional management support is needed unless otherwise documented below in the visit note. 

## 2013-11-12 NOTE — Assessment & Plan Note (Signed)
Chronic, stable. Continue regimen. 

## 2013-11-12 NOTE — Assessment & Plan Note (Signed)
Preventative protocols reviewed and updated unless pt declined. Discussed healthy diet and lifestyle.  

## 2013-11-12 NOTE — Patient Instructions (Addendum)
Flu shot today. prevnar today. Protein level was a bit low - increase boost or ensure to every other day. Advanced directive packet provided today to look over. Return as needed or in 8 months for follow up. You are doing very well today! Have a happy birthday next month

## 2013-11-12 NOTE — Assessment & Plan Note (Signed)
Stable period without change in status or level of care need.

## 2013-11-12 NOTE — Assessment & Plan Note (Addendum)
Stabilized.  Wt Readings from Last 3 Encounters:  11/12/13 128 lb 8 oz (58.287 kg)  07/06/13 128 lb (58.06 kg)  05/22/13 125 lb 8 oz (56.926 kg)

## 2013-11-12 NOTE — Addendum Note (Signed)
Addended by: Royann Shivers A on: 11/12/2013 03:47 PM   Modules accepted: Orders

## 2013-11-12 NOTE — Assessment & Plan Note (Signed)
Advanced directives: would want HCPOA to be any of her children. Does not want prolonged life support, does not want feeding tube or breathing tube. Discussed advanced directive. Has not set up.

## 2013-11-12 NOTE — Assessment & Plan Note (Signed)
Chronic ,stable. Knows to avoid NSAIDs and stay well hydrated

## 2013-11-12 NOTE — Assessment & Plan Note (Signed)
Lab Results  Component Value Date   [id number] 762 11/01/2011

## 2013-11-12 NOTE — Progress Notes (Signed)
BP 122/56  Pulse 96  Temp(Src) 97.7 F (36.5 C) (Oral)  Ht 5\' 6"  (1.676 m)  Wt 128 lb 8 oz (58.287 kg)  BMI 20.75 kg/m2   CC: medicare wellness visit  Subjective:    Patient ID: [name], female    DOB: 08-20-1920, 80 y.o.   MRN: [id number]  HPI: [name] is a 80 y.o. female presenting on 11/12/2013 for Annual Exam   [name] is a pleasant 93ZJ with h/o diastolic CHF, HTN, and CRI stage 3 presents for medicare wellness visit. Presents with daughter today.   CKD - knows to avoid NSAIDs. Stays well hydrated.  Hypothyroidism - longstanding. Tolerating recent commencement of thyroid medication   Geriatric assessment 04/2013 - Mental Status Exam: 13/30. Missed 5 orientation, 5 attention/calculation, 3 recall, and 4 language.  ~8th grade education  Clock Drawing Score: 3/4 Independent ADLs, dependent in IADLs. Exam last visit thought consistent with senile dementia as no behavior changes, able to recognize family well.  Denies depression, anxiety, sadness, anhedonia.  1 fall over last year - tripped over rug and sustained mult lacerations Failed hearing R>L - denies trouble with this. Recent eye exam per patient.  Preventative: Colonoscopy 03/2010 - 1 polyp, int hemorrhoids. rec f/u with GI PRN. Traci Ina)  Mammogram - possibly 2009. Aged out. No new or suspicious skin spots  Flu shot - today.  Tetanus 2004 - declines rpt today.  Pneumovax completed,  Shingles shot - will check with insurance.  Advanced directives: would want HCPOA to be any of her children. Does not want prolonged life support, does not want feeding tube or breathing tube. Discussed advanced directive. Has not set up.  Relevant past medical, surgical, family and social history reviewed and updated as indicated.  Allergies and medications reviewed and updated. Current Outpatient Prescriptions on File Prior to Visit  Medication Sig  . amLODipine (NORVASC) 5 MG tablet TAKE 1 TABLET EVERY  EVENING  . aspirin EC 81 MG tablet Take 81 mg by mouth daily.   . beta carotene w/minerals (OCUVITE) tablet Take 1 tablet by mouth daily.  . Cholecalciferol (VITAMIN D3) 1000 UNITS CAPS Take by mouth 2 (two) times daily.    . ferrous sulfate 325 (65 FE) MG tablet Take 325 mg by mouth 2 (two) times daily.    . furosemide (LASIX) 20 MG tablet TAKE 1/2 TABLET EVERY MORNING.  Marland Kitchen levothyroxine (SYNTHROID, LEVOTHROID) 25 MCG tablet TAKE AS DIRECTED ON MONDAY THRU FRIDAY.HOLD ON WEEKENDS.TAKE BEFORE BREAKFAST  . lisinopril (PRINIVIL,ZESTRIL) 5 MG tablet TAKE 1 TABLET EVERY DAY  . vitamin B-12 (CYANOCOBALAMIN) 1000 MCG tablet Take 1,000 mcg by mouth daily.     No current facility-administered medications on file prior to visit.    Review of Systems  Constitutional: Negative for fever, chills, activity change, appetite change, fatigue and unexpected weight change.  HENT: Negative for hearing loss.   Eyes: Negative for visual disturbance.  Respiratory: Negative for cough, chest tightness, shortness of breath and wheezing.   Cardiovascular: Negative for chest pain, palpitations and leg swelling.  Gastrointestinal: Negative for nausea, vomiting, abdominal pain, diarrhea, constipation, blood in stool and abdominal distention.  Genitourinary: Negative for hematuria and difficulty urinating.  Musculoskeletal: Negative for arthralgias, myalgias and neck pain.  Skin: Negative for rash.  Neurological: Negative for dizziness, seizures, syncope and headaches.  Hematological: Negative for adenopathy. Does not bruise/bleed easily.  Psychiatric/Behavioral: Negative for dysphoric mood. The patient is not nervous/anxious.    Per HPI unless specifically  indicated above    Objective:    BP 122/56  Pulse 96  Temp(Src) 97.7 F (36.5 C) (Oral)  Ht 5\' 6"  (1.676 m)  Wt 128 lb 8 oz (58.287 kg)  BMI 20.75 kg/m2  Physical Exam  Nursing note and vitals reviewed. Constitutional: She is oriented to person, place,  and time. She appears well-developed and well-nourished. No distress.  HENT:  Head: Normocephalic and atraumatic.  Right Ear: Hearing, tympanic membrane, external ear and ear canal normal.  Left Ear: Hearing, tympanic membrane, external ear and ear canal normal.  Nose: Nose normal.  Mouth/Throat: Uvula is midline, oropharynx is clear and moist and mucous membranes are normal. No oropharyngeal exudate, posterior oropharyngeal edema or posterior oropharyngeal erythema.  Eyes: Conjunctivae and EOM are normal. Pupils are equal, round, and reactive to light. No scleral icterus.  Neck: Normal range of motion. Neck supple. Carotid bruit is not present. No thyromegaly present.  Cardiovascular: Normal rate, regular rhythm, normal heart sounds and intact distal pulses.   No murmur heard. Pulses:      Radial pulses are 2+ on the right side, and 2+ on the left side.  Pulmonary/Chest: Effort normal and breath sounds normal. No respiratory distress. She has no wheezes. She has no rales.  Breast deferred - aged out  Abdominal: Soft. Bowel sounds are normal. She exhibits no distension and no mass. There is no tenderness. There is no rebound and no guarding.  Genitourinary:  Well woman - deferred - aged out  Musculoskeletal: Normal range of motion. She exhibits no edema.  Lymphadenopathy:    She has no cervical adenopathy.  Neurological: She is alert and oriented to person, place, and time.  CN grossly intact, station and gait intact Known senile dementia  Skin: Skin is warm and dry. No rash noted.  Psychiatric: She has a normal mood and affect. Her behavior is normal. Judgment and thought content normal.   Results for orders placed in visit on 11/06/13  LIPID PANEL      Result Value Ref Range   Cholesterol 155  0 - 200 mg/dL   Triglycerides 58.0  0.0 - 149.0 mg/dL   HDL 53.30  >39.00 mg/dL   VLDL 11.6  0.0 - 40.0 mg/dL   LDL Cholesterol 90  0 - 99 mg/dL   Total CHOL/HDL Ratio 3     NonHDL 101.70     TSH      Result Value Ref Range   TSH 3.21  0.35 - 4.50 uIU/mL  CBC WITH DIFFERENTIAL      Result Value Ref Range   WBC 5.9  4.0 - 10.5 K/uL   RBC 3.90  3.87 - 5.11 Mil/uL   Hemoglobin 12.2  12.0 - 15.0 g/dL   HCT 36.8  36.0 - 46.0 %   MCV 94.3  78.0 - 100.0 fl   MCHC 33.1  30.0 - 36.0 g/dL   RDW 13.1  11.5 - 15.5 %   Platelets 131.0 (*) 150.0 - 400.0 K/uL   Neutrophils Relative % 52.7  43.0 - 77.0 %   Lymphocytes Relative 37.0  12.0 - 46.0 %   Monocytes Relative 6.8  3.0 - 12.0 %   Eosinophils Relative 2.9  0.0 - 5.0 %   Basophils Relative 0.6  0.0 - 3.0 %   Neutro Abs 3.1  1.4 - 7.7 K/uL   Lymphs Abs 2.2  0.7 - 4.0 K/uL   Monocytes Absolute 0.4  0.1 - 1.0 K/uL   Eosinophils Absolute 0.2  0.0 - 0.7 K/uL   Basophils Absolute 0.0  0.0 - 0.1 K/uL  RENAL FUNCTION PANEL      Result Value Ref Range   Sodium 140  135 - 145 mEq/L   Potassium 4.5  3.5 - 5.1 mEq/L   Chloride 107  96 - 112 mEq/L   CO2 24  19 - 32 mEq/L   Calcium 9.9  8.4 - 10.5 mg/dL   Albumin 3.2 (*) 3.5 - 5.2 g/dL   BUN 29 (*) 6 - 23 mg/dL   Creatinine, Ser 1.5 (*) 0.4 - 1.2 mg/dL   Glucose, Bld 94  70 - 99 mg/dL   Phosphorus 3.6  2.3 - 4.6 mg/dL   GFR 35.42 (*) >60.00 mL/min      Assessment & Plan:   Problem List Items Addressed This Visit   Weight loss      Stabilized.  Wt Readings from Last 3 Encounters:  11/12/13 128 lb 8 oz (58.287 kg)  07/06/13 128 lb (58.06 kg)  05/22/13 125 lb 8 oz (56.926 kg)      Senile dementia     Stable period without change in status or level of care need.    Medicare annual wellness visit, subsequent - Primary     I have personally reviewed the Medicare Annual Wellness questionnaire and have noted 1. The patient's medical and social history 2. Their use of alcohol, tobacco or illicit drugs 3. Their current medications and supplements 4. The patient's functional ability including ADL's, fall risks, home safety risks and hearing or visual impairment. 5. Diet and  physical activity 6. Evidence for depression or mood disorders The patients weight, height, BMI have been recorded in the chart.  Hearing and vision has been addressed. I have made referrals, counseling and provided education to the patient based review of the above and I have provided the pt with a written personalized care plan for preventive services. Provider list updated - see scanned questionairre.  Reviewed preventative protocols and updated unless pt declined.    Hypothyroidism     Stable. Continue low dose levothyroxine. Tolerating well.    History of pernicious anemia      Lab Results  Component Value Date   VITAMINB12 903 11/01/2011      RESOLVED: History of anemia      No longer anemic. Compliant with b12 supplement daily. Lab Results  Component Value Date   [id number] 790 11/01/2011       Health maintenance examination     Preventative protocols reviewed and updated unless pt declined. Discussed healthy diet and lifestyle.    Essential hypertension     Chronic, stable. Continue regimen.    CKD (chronic kidney disease), stage III     Chronic ,stable. Knows to avoid NSAIDs and stay well hydrated    Chronic diastolic CHF (congestive heart failure)     Chronic ,stable. asxs.    Advanced care planning/counseling discussion     Advanced directives: would want HCPOA to be any of her children. Does not want prolonged life support, does not want feeding tube or breathing tube. Discussed advanced directive. Has not set up.        Follow up plan: Return in about 8 months (around 07/14/2014), or as needed, for follow up visit.

## 2013-11-12 NOTE — Assessment & Plan Note (Signed)
Stable. Continue low dose levothyroxine. Tolerating well.

## 2013-11-12 NOTE — Assessment & Plan Note (Signed)
Chronic, stable. asxs. 

## 2013-11-13 ENCOUNTER — Telehealth: Payer: Self-pay | Admitting: Family Medicine

## 2013-11-13 NOTE — Telephone Encounter (Signed)
emmi mailed  °

## 2013-11-26 ENCOUNTER — Other Ambulatory Visit: Payer: Self-pay

## 2013-11-26 DIAGNOSIS — Z1231 Encounter for screening mammogram for malignant neoplasm of breast: Secondary | ICD-10-CM

## 2013-12-01 ENCOUNTER — Other Ambulatory Visit: Payer: Self-pay | Admitting: Family Medicine

## 2013-12-04 ENCOUNTER — Ambulatory Visit (INDEPENDENT_AMBULATORY_CARE_PROVIDER_SITE_OTHER): Payer: Medicare Other | Admitting: Family Medicine

## 2013-12-04 ENCOUNTER — Encounter: Payer: Self-pay | Admitting: Family Medicine

## 2013-12-04 VITALS — BP 118/62 | HR 93 | Temp 97.8°F | Wt 129.0 lb

## 2013-12-04 DIAGNOSIS — R05 Cough: Secondary | ICD-10-CM

## 2013-12-04 DIAGNOSIS — H6120 Impacted cerumen, unspecified ear: Secondary | ICD-10-CM | POA: Insufficient documentation

## 2013-12-04 DIAGNOSIS — J189 Pneumonia, unspecified organism: Secondary | ICD-10-CM | POA: Insufficient documentation

## 2013-12-04 DIAGNOSIS — H6123 Impacted cerumen, bilateral: Secondary | ICD-10-CM

## 2013-12-04 DIAGNOSIS — J181 Lobar pneumonia, unspecified organism: Secondary | ICD-10-CM

## 2013-12-04 DIAGNOSIS — R059 Cough, unspecified: Secondary | ICD-10-CM

## 2013-12-04 MED ORDER — BENZONATATE 200 MG PO CAPS: 200.0000 mg | ORAL_CAPSULE | Freq: Three times a day (TID) | ORAL | Status: AC | PRN

## 2013-12-04 NOTE — Progress Notes (Signed)
Pre visit review using our clinic review tool, if applicable. No additional management support is needed unless otherwise documented below in the visit note. 

## 2013-12-04 NOTE — Patient Instructions (Signed)
Nice to meet you.  Use dilute hydrogen peroxide (1:1 with water) a few drops in both ears to help break down ear wax.  Drink plenty of fluids.  Ok to [employer] as needed for cough.   Take antibiotic as directed.  Drink lots of fluids.    Call if not improving as expected in 5-7 days.

## 2013-12-04 NOTE — Addendum Note (Signed)
Addended by: Lucille Passy on: 12/04/2013 11:05 AM   Modules accepted: Level of Service

## 2013-12-04 NOTE — Progress Notes (Signed)
  Subjective:    Patient ID: [name], female    DOB: 05-18-1920, 80 y.o.   MRN: [id number]  Cough   CC: cough  Pleasant 80 yo pt of Dr. Darnell Level, new to me, here for cough.  Cough started yesterday, it is dry.  Has tried benadryl which helped.  No fevers/chills, abd pain, no SOB. She is not a smoker and there are no smokers in her home.  Sleeping ok.  No known sick contacts.    Past Medical History  Diagnosis Date  . Hypertension 10/1998  . Hyperlipidemia 12/2002  . Diastolic CHF   . Vaginal delivery     x4, at home  . Anemia 12/30-01/25/2006    [facility]- CHF- started lasix  . History of colon cancer     s/p partial colectomy  . History of ETT 03/15/2006    adenosine myoview low risk  . Hypothyroidism   . Microhematuria 03/2013    watchful waiting ([facility])  . CKD (chronic kidney disease), stage III 03/04/2010     Review of Systems  Respiratory: Positive for cough.    Per HPI    Objective:   Physical Exam  Constitutional: She appears well-developed and well-nourished. No distress.  HENT:  Mouth/Throat: Uvula is midline. Posterior oropharyngeal erythema present. No oropharyngeal exudate or posterior oropharyngeal edema.  Bilateral ceruminosis, hard of hearing  Cardiovascular: Normal rate and regular rhythm.   Pulmonary/Chest: Effort normal and breath sounds normal. No respiratory distress. She has no wheezes. She has no rales. She exhibits no tenderness.  Skin: Skin is warm, dry and intact.  Psychiatric: She has a normal mood and affect. Her speech is normal and behavior is normal. Judgment and thought content normal. Cognition and memory are normal.      Assessment & Plan:

## 2013-12-04 NOTE — Assessment & Plan Note (Signed)
New- vital signs look good and lung exam reassuring. Likely viral. Advised supportive care. eRx sent for tessalon. Call or return to clinic prn if these symptoms worsen or fail to improve as anticipated. The patient indicates understanding of these issues and agrees with the plan.

## 2013-12-04 NOTE — Assessment & Plan Note (Signed)
New- advised using 1:1 water: hydrogen peroxide. See AVS. Call or return to clinic prn if these symptoms worsen or fail to improve as anticipated.

## 2013-12-05 ENCOUNTER — Other Ambulatory Visit: Payer: Self-pay | Admitting: Family Medicine

## 2013-12-27 ENCOUNTER — Telehealth: Payer: Self-pay

## 2013-12-27 NOTE — Telephone Encounter (Signed)
Anderson Malta nurse case mgr with [facility] left v/m requesting last echo EF; appears 04/18/2006 Echo done; left v/m for pt Anderson Malta to contact [facility] cardiology at [phone] for results.

## 2014-01-03 ENCOUNTER — Other Ambulatory Visit: Payer: Self-pay | Admitting: Family Medicine

## 2014-01-04 ENCOUNTER — Other Ambulatory Visit: Payer: Self-pay

## 2014-01-04 ENCOUNTER — Encounter (INDEPENDENT_AMBULATORY_CARE_PROVIDER_SITE_OTHER): Payer: Self-pay

## 2014-01-04 ENCOUNTER — Ambulatory Visit
Admission: RE | Admit: 2014-01-04 | Discharge: 2014-01-04 | Disposition: A | Discharge status: Home / Self Care | Payer: Medicare Other | Source: Ambulatory Visit

## 2014-01-04 DIAGNOSIS — Z1231 Encounter for screening mammogram for malignant neoplasm of breast: Secondary | ICD-10-CM

## 2014-01-04 LAB — HM MAMMOGRAPHY: HM MAMMO: NORMAL

## 2014-01-09 ENCOUNTER — Encounter: Payer: Self-pay | Admitting: *Deleted

## 2014-01-31 ENCOUNTER — Encounter (HOSPITAL_COMMUNITY): Payer: Self-pay | Admitting: *Deleted

## 2014-01-31 ENCOUNTER — Emergency Department (HOSPITAL_COMMUNITY): Payer: Medicare Other

## 2014-01-31 ENCOUNTER — Inpatient Hospital Stay (HOSPITAL_COMMUNITY)
Admission: EM | Admit: 2014-01-31 | Discharge: 2014-02-04 | DRG: 470 | Disposition: A | Discharge status: Skilled Nursing Facility | Payer: Medicare Other | Attending: Internal Medicine | Admitting: Internal Medicine

## 2014-01-31 DIAGNOSIS — Z7982 Long term (current) use of aspirin: Secondary | ICD-10-CM

## 2014-01-31 DIAGNOSIS — Z833 Family history of diabetes mellitus: Secondary | ICD-10-CM | POA: Diagnosis not present

## 2014-01-31 DIAGNOSIS — E039 Hypothyroidism, unspecified: Secondary | ICD-10-CM | POA: Diagnosis not present

## 2014-01-31 DIAGNOSIS — S72041A Displaced fracture of base of neck of right femur, initial encounter for closed fracture: Secondary | ICD-10-CM | POA: Diagnosis not present

## 2014-01-31 DIAGNOSIS — Y92013 Bedroom of single-family (private) house as the place of occurrence of the external cause: Secondary | ICD-10-CM

## 2014-01-31 DIAGNOSIS — S299XXA Unspecified injury of thorax, initial encounter: Secondary | ICD-10-CM | POA: Diagnosis not present

## 2014-01-31 DIAGNOSIS — E785 Hyperlipidemia, unspecified: Secondary | ICD-10-CM | POA: Diagnosis not present

## 2014-01-31 DIAGNOSIS — I10 Essential (primary) hypertension: Secondary | ICD-10-CM | POA: Diagnosis not present

## 2014-01-31 DIAGNOSIS — Z85038 Personal history of other malignant neoplasm of large intestine: Secondary | ICD-10-CM | POA: Diagnosis not present

## 2014-01-31 DIAGNOSIS — Z96649 Presence of unspecified artificial hip joint: Secondary | ICD-10-CM

## 2014-01-31 DIAGNOSIS — Z823 Family history of stroke: Secondary | ICD-10-CM

## 2014-01-31 DIAGNOSIS — Z79899 Other long term (current) drug therapy: Secondary | ICD-10-CM

## 2014-01-31 DIAGNOSIS — I5032 Chronic diastolic (congestive) heart failure: Secondary | ICD-10-CM | POA: Diagnosis not present

## 2014-01-31 DIAGNOSIS — Z9049 Acquired absence of other specified parts of digestive tract: Secondary | ICD-10-CM | POA: Diagnosis present

## 2014-01-31 DIAGNOSIS — N183 Chronic kidney disease, stage 3 unspecified: Secondary | ICD-10-CM | POA: Diagnosis present

## 2014-01-31 DIAGNOSIS — Z8249 Family history of ischemic heart disease and other diseases of the circulatory system: Secondary | ICD-10-CM

## 2014-01-31 DIAGNOSIS — S72002A Fracture of unspecified part of neck of left femur, initial encounter for closed fracture: Secondary | ICD-10-CM | POA: Diagnosis not present

## 2014-01-31 DIAGNOSIS — W19XXXA Unspecified fall, initial encounter: Secondary | ICD-10-CM

## 2014-01-31 DIAGNOSIS — D510 Vitamin B12 deficiency anemia due to intrinsic factor deficiency: Secondary | ICD-10-CM | POA: Diagnosis present

## 2014-01-31 DIAGNOSIS — W1839XA Other fall on same level, initial encounter: Secondary | ICD-10-CM | POA: Diagnosis present

## 2014-01-31 DIAGNOSIS — I129 Hypertensive chronic kidney disease with stage 1 through stage 4 chronic kidney disease, or unspecified chronic kidney disease: Secondary | ICD-10-CM | POA: Diagnosis not present

## 2014-01-31 DIAGNOSIS — Z8500 Personal history of malignant neoplasm of unspecified digestive organ: Secondary | ICD-10-CM | POA: Diagnosis present

## 2014-01-31 DIAGNOSIS — M79605 Pain in left leg: Secondary | ICD-10-CM | POA: Diagnosis not present

## 2014-01-31 DIAGNOSIS — M25552 Pain in left hip: Secondary | ICD-10-CM | POA: Diagnosis not present

## 2014-01-31 DIAGNOSIS — Z862 Personal history of diseases of the blood and blood-forming organs and certain disorders involving the immune mechanism: Secondary | ICD-10-CM

## 2014-01-31 LAB — CBC WITH DIFFERENTIAL/PLATELET
BASOS PCT: 0 % (ref 0–1)
Basophils Absolute: 0 10*3/uL (ref 0.0–0.1)
EOS ABS: 0.2 10*3/uL (ref 0.0–0.7)
Eosinophils Relative: 2 % (ref 0–5)
HEMATOCRIT: 35.3 % — AB (ref 36.0–46.0)
HEMOGLOBIN: 11.8 g/dL — AB (ref 12.0–15.0)
Lymphocytes Relative: 19 % (ref 12–46)
Lymphs Abs: 1.9 10*3/uL (ref 0.7–4.0)
MCH: 31.7 pg (ref 26.0–34.0)
MCHC: 33.4 g/dL (ref 30.0–36.0)
MCV: 94.9 fL (ref 78.0–100.0)
MONO ABS: 0.7 10*3/uL (ref 0.1–1.0)
MONOS PCT: 7 % (ref 3–12)
Neutro Abs: 7.2 10*3/uL (ref 1.7–7.7)
Neutrophils Relative %: 72 % (ref 43–77)
Platelets: 130 10*3/uL — ABNORMAL LOW (ref 150–400)
RBC: 3.72 MIL/uL — AB (ref 3.87–5.11)
RDW: 12.7 % (ref 11.5–15.5)
WBC: 10 10*3/uL (ref 4.0–10.5)

## 2014-01-31 LAB — URINALYSIS, ROUTINE W REFLEX MICROSCOPIC
Bilirubin Urine: NEGATIVE
GLUCOSE, UA: NEGATIVE mg/dL
HGB URINE DIPSTICK: NEGATIVE
Ketones, ur: NEGATIVE mg/dL
Nitrite: NEGATIVE
PH: 5.5 (ref 5.0–8.0)
Protein, ur: NEGATIVE mg/dL
SPECIFIC GRAVITY, URINE: 1.017 (ref 1.005–1.030)
Urobilinogen, UA: 1 mg/dL (ref 0.0–1.0)

## 2014-01-31 LAB — BASIC METABOLIC PANEL
Anion gap: 8 (ref 5–15)
BUN: 32 mg/dL — AB (ref 6–23)
CHLORIDE: 104 meq/L (ref 96–112)
CO2: 25 mmol/L (ref 19–32)
Calcium: 9.2 mg/dL (ref 8.4–10.5)
Creatinine, Ser: 1.38 mg/dL — ABNORMAL HIGH (ref 0.50–1.10)
GFR calc Af Amer: 37 mL/min — ABNORMAL LOW (ref >90)
GFR calc non Af Amer: 32 mL/min — ABNORMAL LOW (ref >90)
GLUCOSE: 142 mg/dL — AB (ref 70–99)
POTASSIUM: 4.5 mmol/L (ref 3.5–5.1)
Sodium: 137 mmol/L (ref 135–145)

## 2014-01-31 LAB — URINE MICROSCOPIC-ADD ON

## 2014-01-31 MED ORDER — FENTANYL CITRATE 0.05 MG/ML IJ SOLN
50.0000 ug | INTRAMUSCULAR | Status: DC | PRN
Start: 2014-01-31 — End: 2014-02-01
  Administered 2014-01-31: 50 ug via INTRAVENOUS
  Filled 2014-01-31: qty 2

## 2014-01-31 NOTE — ED Provider Notes (Signed)
CSN: [id number]     Arrival date & time 01/31/14  2156 History   First MD Initiated Contact with Patient 01/31/14 2219     Chief Complaint  Patient presents with  . Fall  . Hip Pain     (Consider location/radiation/quality/duration/timing/severity/associated sxs/prior Treatment) HPI Comments: The patient is a 81 year old female past history of hypertension, diastolic heart failure, hyperlipidemia, CK D presenting to prior chief complaint of left hip pain. Patient reports fall at home, believe she was caught up in coat. Patient family reports hearing fall and immediately coming to patient's side. Patient was unable to relate due to pain and discomfort.  Patient is a 81 y.o. female presenting with fall and hip pain. The history is provided by the patient. No language interpreter was used.  Fall Associated symptoms include arthralgias. Pertinent negatives include no abdominal pain, chest pain or headaches.  Hip Pain Associated symptoms include arthralgias. Pertinent negatives include no abdominal pain, chest pain or headaches.    Past Medical History  Diagnosis Date  . Hypertension 10/1998  . Hyperlipidemia 12/2002  . Diastolic CHF   . Vaginal delivery     x4, at home  . Anemia 12/30-01/25/2006    [facility]- CHF- started lasix  . History of colon cancer     s/p partial colectomy  . History of ETT 03/15/2006    adenosine myoview low risk  . Hypothyroidism   . Microhematuria 03/2013    watchful waiting (MacDiarmid)  . CKD (chronic kidney disease), stage III 03/04/2010   Past Surgical History  Procedure Laterality Date  . Cholecystectomy  1984  . Partial colectomy  03/25/2006    right  . Esophagogastroduodenoscopy      B9 polyp H.Pylori bx- "dilated" polyps  . History of abd  02/25/2006    bilat renal cysts, no mets  . Ct head limited w/o cm  02/2011    no acute process.  Mild to moderate cortical volume loss and scattered small vessel ischemic microangiopathy  . Colonoscopy  03/2010     1 tubular adenoma, int hemorrhoids, f/u prn [name]   Family History  Problem Relation Age of Onset  . Stroke Sister   . Diabetes Sister   . Hypertension Sister   . Stroke Mother   . Alcohol abuse Brother    History  Substance Use Topics  . Smoking status: Never Smoker   . Smokeless tobacco: Never Used  . Alcohol Use: No   OB History    No data available     Review of Systems  Cardiovascular: Negative for chest pain.  Gastrointestinal: Negative for abdominal pain.  Musculoskeletal: Positive for arthralgias and gait problem.  Neurological: Negative for syncope, light-headedness and headaches.      Allergies  Review of patient's allergies indicates no known allergies.  Home Medications   Prior to Admission medications   Medication Sig Start Date End Date Taking? Authorizing Provider  amLODipine (NORVASC) 5 MG tablet TAKE 1 TABLET EVERY EVENING 01/03/14   Ria Bush, MD  aspirin EC 81 MG tablet Take 81 mg by mouth daily.     Historical Provider, MD  beta carotene w/minerals (OCUVITE) tablet Take 1 tablet by mouth daily.    Historical Provider, MD  Cholecalciferol (VITAMIN D3) 1000 UNITS CAPS Take by mouth 2 (two) times daily.      Historical Provider, MD  ferrous sulfate 325 (65 FE) MG tablet Take 325 mg by mouth 2 (two) times daily.      Historical Provider, MD  furosemide (LASIX) 20 MG tablet TAKE 1/2 TABLET EVERY MORNING. 12/03/13   Ria Bush, MD  levothyroxine (SYNTHROID, LEVOTHROID) 25 MCG tablet TAKE AS DIRECTED ON MONDAY THRU FRIDAY.HOLD ON WEEKENDS.TAKE BEFORE BREAKFAST 11/08/13   Ria Bush, MD  lisinopril (PRINIVIL,ZESTRIL) 5 MG tablet TAKE 1 TABLET EVERY DAY 12/05/13   Ria Bush, MD  vitamin B-12 (CYANOCOBALAMIN) 1000 MCG tablet Take 1,000 mcg by mouth daily.      Historical Provider, MD   BP 141/55 mmHg  Pulse 78  Temp(Src) 97.6 F (36.4 C) (Oral)  Resp 16  SpO2 100% Physical Exam  Constitutional: She is oriented to person,  place, and time. She appears well-developed and well-nourished. No distress.  HENT:  Head: Normocephalic and atraumatic.  Neck: Neck supple.  Cardiovascular: Normal rate and regular rhythm.   Pulses:      Dorsalis pedis pulses are 1+ on the right side, and 1+ on the left side.  Pulmonary/Chest: Effort normal. No respiratory distress. She has no wheezes. She has no rales.  Abdominal: Soft. There is no tenderness.  Musculoskeletal:       Left hip: She exhibits tenderness and deformity.  Left hip shortened, externally rotated.  Neurological: She is alert and oriented to person, place, and time.  Skin: Skin is warm and dry. She is not diaphoretic.  Psychiatric: She has a normal mood and affect. Her behavior is normal.  Nursing note and vitals reviewed.   ED Course  Procedures (including critical care time) Labs Review Labs Reviewed  BASIC METABOLIC PANEL - Abnormal; Notable for the following:    Glucose, Bld 142 (*)    BUN 32 (*)    Creatinine, Ser 1.38 (*)    GFR calc non Af Amer 32 (*)    GFR calc Af Amer 37 (*)    All other components within normal limits  CBC WITH DIFFERENTIAL - Abnormal; Notable for the following:    RBC 3.72 (*)    Hemoglobin 11.8 (*)    HCT 35.3 (*)    Platelets 130 (*)    All other components within normal limits  URINALYSIS, ROUTINE W REFLEX MICROSCOPIC    Imaging Review Dg Chest 1 View  01/31/2014   CLINICAL DATA:  Status post fall. Concern for chest injury. Initial encounter.  EXAM: CHEST - 1 VIEW  COMPARISON:  Chest radiograph performed 03/12/2011  FINDINGS: The lungs are well-aerated hyperexpanded, with flattening of the hemidiaphragms, likely reflecting COPD. Minimal left basilar atelectasis or scarring is again noted. There is no evidence of pleural effusion or pneumothorax.  The cardiomediastinal silhouette is borderline normal in size. No acute osseous abnormalities are seen.  IMPRESSION: Findings suggest COPD. Minimal left basilar atelectasis or  scarring noted. No displaced rib fracture seen.   Electronically Signed   By: Garald Balding M.D.   On: 01/31/2014 23:38   Dg Hip Unilat With Pelvis 2-3 Views Left  01/31/2014   CLINICAL DATA:  Lost balance and fell today at home while taking of shoes. LEFT hip pain.  EXAM: DG HIP W/ PELVIS 2-3V*L*  COMPARISON:  None available for comparison at time of study interpretation.  FINDINGS: LEFT femoral neck fracture with impaction, varus angulation distal bony fragments. No dislocation. No destructive bony lesions. Soft tissue planes are nonsuspicious.  IMPRESSION: Impacted displaced LEFT femoral neck fracture without dislocation.   Electronically Signed   By: Elon Alas   On: 01/31/2014 23:41     EKG Interpretation   Date/Time:  Thursday January 31 2014 22:41:00  EST Ventricular Rate:  90 PR Interval:  185 QRS Duration: 97 QT Interval:  380 QTC Calculation: 465 R Axis:   77 Text Interpretation:  Sinus rhythm Ventricular premature complex Confirmed  by Jeneen Rinks  MD, Sperry ([id number]) on 01/31/2014 10:43:06 PM      MDM   Final diagnoses:  Fall  Closed displaced fracture of left femoral neck, initial encounter   Pt with likely hip fracture from mechanical fall. XR pain medication ordered. XR show left femoral neck fracture.  Dr. Regenia Skeeter also evaluated the patient.  12:04 AM Discussed with Dr. Blaine Hamper agrees to admit the patient. 1:26 AM Dr. Wynelle Link to evaluate the patient in the morning, likely surgery over the weekend. Advises Lovenox for DVT prophylaxis.  Harvie Heck, PA-C 02/01/14 3888  Ephraim Hamburger, MD 02/03/14 [phone]

## 2014-01-31 NOTE — ED Notes (Signed)
PA at bedside.

## 2014-01-31 NOTE — ED Notes (Addendum)
PT in XRAY

## 2014-01-31 NOTE — Progress Notes (Signed)
CSW met with patient at bedside. Nurse and daughter were present at bedside. Patient confirms that she fell while at home today. Patient states she does not know what caused her to fall. Patient stated " I just fell". Patient states that her daughter witnessed her fall. Patient states she lives with her daughter in [place]. Patient informed CSW that she cannot move her left leg. Patient states that prior to coming into the ED she has not had trouble walking and has been able to complete her ADL's independently.   Daugter/Hellen May [phone] Daughter/ Ms.Merlyn Albert [phone] (Daughter the patient resides with)  Willette Brace [phone] ED CSW 01/31/2014 10:27 PM

## 2014-01-31 NOTE — ED Notes (Signed)
Shortening to left leg. Patient c/o pain to left hip with movement.

## 2014-01-31 NOTE — ED Notes (Signed)
Per EMS, pt from home, reports pt was taking her shoes off, lost her balance and fell.  Pt reports L hip pain only with movement.  Skin tear to L elbow.

## 2014-01-31 NOTE — ED Notes (Signed)
Bed: RP59 Expected date:  Expected time:  Means of arrival:  Comments: EMS 81 yo female from home/fall/shortening and rotation

## 2014-02-01 ENCOUNTER — Inpatient Hospital Stay (HOSPITAL_COMMUNITY): Payer: Medicare Other

## 2014-02-01 ENCOUNTER — Encounter (HOSPITAL_COMMUNITY): Payer: Self-pay | Admitting: *Deleted

## 2014-02-01 ENCOUNTER — Inpatient Hospital Stay (HOSPITAL_COMMUNITY): Payer: Medicare Other | Admitting: Anesthesiology

## 2014-02-01 ENCOUNTER — Encounter (HOSPITAL_COMMUNITY)
Admission: EM | Disposition: A | Discharge status: Skilled Nursing Facility | Payer: Self-pay | Source: Home / Self Care | Attending: Internal Medicine

## 2014-02-01 DIAGNOSIS — I10 Essential (primary) hypertension: Secondary | ICD-10-CM | POA: Diagnosis not present

## 2014-02-01 DIAGNOSIS — Z85038 Personal history of other malignant neoplasm of large intestine: Secondary | ICD-10-CM | POA: Diagnosis not present

## 2014-02-01 DIAGNOSIS — Z862 Personal history of diseases of the blood and blood-forming organs and certain disorders involving the immune mechanism: Secondary | ICD-10-CM | POA: Diagnosis not present

## 2014-02-01 DIAGNOSIS — Z79899 Other long term (current) drug therapy: Secondary | ICD-10-CM | POA: Diagnosis not present

## 2014-02-01 DIAGNOSIS — E785 Hyperlipidemia, unspecified: Secondary | ICD-10-CM | POA: Diagnosis present

## 2014-02-01 DIAGNOSIS — S72002A Fracture of unspecified part of neck of left femur, initial encounter for closed fracture: Secondary | ICD-10-CM

## 2014-02-01 DIAGNOSIS — Z7982 Long term (current) use of aspirin: Secondary | ICD-10-CM | POA: Diagnosis not present

## 2014-02-01 DIAGNOSIS — Z9049 Acquired absence of other specified parts of digestive tract: Secondary | ICD-10-CM | POA: Diagnosis present

## 2014-02-01 DIAGNOSIS — R2681 Unsteadiness on feet: Secondary | ICD-10-CM | POA: Diagnosis not present

## 2014-02-01 DIAGNOSIS — Z96642 Presence of left artificial hip joint: Secondary | ICD-10-CM | POA: Diagnosis not present

## 2014-02-01 DIAGNOSIS — Z833 Family history of diabetes mellitus: Secondary | ICD-10-CM | POA: Diagnosis not present

## 2014-02-01 DIAGNOSIS — N183 Chronic kidney disease, stage 3 (moderate): Secondary | ICD-10-CM

## 2014-02-01 DIAGNOSIS — W19XXXA Unspecified fall, initial encounter: Secondary | ICD-10-CM | POA: Diagnosis not present

## 2014-02-01 DIAGNOSIS — Z8249 Family history of ischemic heart disease and other diseases of the circulatory system: Secondary | ICD-10-CM | POA: Diagnosis not present

## 2014-02-01 DIAGNOSIS — I129 Hypertensive chronic kidney disease with stage 1 through stage 4 chronic kidney disease, or unspecified chronic kidney disease: Secondary | ICD-10-CM | POA: Diagnosis present

## 2014-02-01 DIAGNOSIS — D510 Vitamin B12 deficiency anemia due to intrinsic factor deficiency: Secondary | ICD-10-CM | POA: Diagnosis not present

## 2014-02-01 DIAGNOSIS — Z96662 Presence of left artificial ankle joint: Secondary | ICD-10-CM | POA: Diagnosis not present

## 2014-02-01 DIAGNOSIS — Y92013 Bedroom of single-family (private) house as the place of occurrence of the external cause: Secondary | ICD-10-CM | POA: Diagnosis not present

## 2014-02-01 DIAGNOSIS — I5032 Chronic diastolic (congestive) heart failure: Secondary | ICD-10-CM

## 2014-02-01 DIAGNOSIS — S72009A Fracture of unspecified part of neck of unspecified femur, initial encounter for closed fracture: Secondary | ICD-10-CM | POA: Insufficient documentation

## 2014-02-01 DIAGNOSIS — S72022A Displaced fracture of epiphysis (separation) (upper) of left femur, initial encounter for closed fracture: Secondary | ICD-10-CM | POA: Diagnosis not present

## 2014-02-01 DIAGNOSIS — Z471 Aftercare following joint replacement surgery: Secondary | ICD-10-CM | POA: Diagnosis not present

## 2014-02-01 DIAGNOSIS — M25552 Pain in left hip: Secondary | ICD-10-CM | POA: Diagnosis present

## 2014-02-01 DIAGNOSIS — Z9181 History of falling: Secondary | ICD-10-CM | POA: Diagnosis not present

## 2014-02-01 DIAGNOSIS — D649 Anemia, unspecified: Secondary | ICD-10-CM | POA: Diagnosis not present

## 2014-02-01 DIAGNOSIS — M6281 Muscle weakness (generalized): Secondary | ICD-10-CM | POA: Diagnosis not present

## 2014-02-01 DIAGNOSIS — Z823 Family history of stroke: Secondary | ICD-10-CM | POA: Diagnosis not present

## 2014-02-01 DIAGNOSIS — E039 Hypothyroidism, unspecified: Secondary | ICD-10-CM

## 2014-02-01 DIAGNOSIS — W1839XA Other fall on same level, initial encounter: Secondary | ICD-10-CM | POA: Diagnosis present

## 2014-02-01 HISTORY — DX: Fracture of unspecified part of neck of left femur, initial encounter for closed fracture: S72.002A

## 2014-02-01 HISTORY — PX: HIP ARTHROPLASTY: SHX981

## 2014-02-01 LAB — CBC
HCT: 32.3 % — ABNORMAL LOW (ref 36.0–46.0)
Hemoglobin: 10.9 g/dL — ABNORMAL LOW (ref 12.0–15.0)
MCH: 31.8 pg (ref 26.0–34.0)
MCHC: 33.7 g/dL (ref 30.0–36.0)
MCV: 94.2 fL (ref 78.0–100.0)
Platelets: 121 10*3/uL — ABNORMAL LOW (ref 150–400)
RBC: 3.43 MIL/uL — ABNORMAL LOW (ref 3.87–5.11)
RDW: 12.6 % (ref 11.5–15.5)
WBC: 10.6 10*3/uL — ABNORMAL HIGH (ref 4.0–10.5)

## 2014-02-01 LAB — BASIC METABOLIC PANEL
Anion gap: 8 (ref 5–15)
BUN: 29 mg/dL — AB (ref 6–23)
CO2: 25 mmol/L (ref 19–32)
Calcium: 9 mg/dL (ref 8.4–10.5)
Chloride: 107 mEq/L (ref 96–112)
Creatinine, Ser: 1.2 mg/dL — ABNORMAL HIGH (ref 0.50–1.10)
GFR calc Af Amer: 44 mL/min — ABNORMAL LOW (ref >90)
GFR, EST NON AFRICAN AMERICAN: 38 mL/min — AB (ref >90)
GLUCOSE: 124 mg/dL — AB (ref 70–99)
Potassium: 4.2 mmol/L (ref 3.5–5.1)
SODIUM: 140 mmol/L (ref 135–145)

## 2014-02-01 LAB — SURGICAL PCR SCREEN
MRSA, PCR: NEGATIVE
Staphylococcus aureus: NEGATIVE

## 2014-02-01 LAB — BRAIN NATRIURETIC PEPTIDE: B Natriuretic Peptide: 80.2 pg/mL (ref 0.0–100.0)

## 2014-02-01 LAB — ABO/RH: ABO/RH(D): A POS

## 2014-02-01 LAB — GLUCOSE, CAPILLARY: Glucose-Capillary: 115 mg/dL — ABNORMAL HIGH (ref 70–99)

## 2014-02-01 LAB — PROTIME-INR
INR: 0.98 (ref 0.00–1.49)
PROTHROMBIN TIME: 13.1 s (ref 11.6–15.2)

## 2014-02-01 LAB — APTT: APTT: 25 s (ref 24–37)

## 2014-02-01 SURGERY — HEMIARTHROPLASTY, HIP, DIRECT ANTERIOR APPROACH, FOR FRACTURE
Anesthesia: Spinal | Site: Hip | Laterality: Left

## 2014-02-01 MED ORDER — ACETAMINOPHEN 325 MG PO TABS
650.0000 mg | ORAL_TABLET | Freq: Four times a day (QID) | ORAL | Status: DC | PRN
  Administered 2014-02-02 – 2014-02-04 (×6): 650 mg via ORAL
  Filled 2014-02-01 (×6): qty 2

## 2014-02-01 MED ORDER — PHENYLEPHRINE HCL 10 MG/ML IJ SOLN
10.0000 mg | INTRAVENOUS | Status: DC | PRN
  Administered 2014-02-01: 10 ug/min via INTRAVENOUS

## 2014-02-01 MED ORDER — OXYCODONE-ACETAMINOPHEN 5-325 MG PO TABS: 1.0000 | ORAL_TABLET | ORAL | Status: DC | PRN

## 2014-02-01 MED ORDER — PROPOFOL 10 MG/ML IV BOLUS
INTRAVENOUS | Status: AC
  Filled 2014-02-01: qty 20

## 2014-02-01 MED ORDER — LISINOPRIL 5 MG PO TABS
5.0000 mg | ORAL_TABLET | Freq: Every day | ORAL | Status: DC
  Filled 2014-02-01: qty 1

## 2014-02-01 MED ORDER — MEPERIDINE HCL 50 MG/ML IJ SOLN
INTRAMUSCULAR | Status: AC
  Filled 2014-02-01: qty 1

## 2014-02-01 MED ORDER — PHENYLEPHRINE 40 MCG/ML (10ML) SYRINGE FOR IV PUSH (FOR BLOOD PRESSURE SUPPORT)
PREFILLED_SYRINGE | INTRAVENOUS | Status: AC
  Filled 2014-02-01: qty 10

## 2014-02-01 MED ORDER — MEPERIDINE HCL 50 MG/ML IJ SOLN
6.2500 mg | INTRAMUSCULAR | Status: DC | PRN
  Administered 2014-02-01: 6.25 mg via INTRAVENOUS

## 2014-02-01 MED ORDER — BUPIVACAINE HCL (PF) 0.5 % IJ SOLN
INTRAMUSCULAR | Status: AC
  Filled 2014-02-01: qty 30

## 2014-02-01 MED ORDER — FENTANYL CITRATE 0.05 MG/ML IJ SOLN: 25.0000 ug | INTRAMUSCULAR | Status: DC | PRN

## 2014-02-01 MED ORDER — ASPIRIN EC 81 MG PO TBEC
81.0000 mg | DELAYED_RELEASE_TABLET | Freq: Every day | ORAL | Status: DC
  Filled 2014-02-01: qty 1

## 2014-02-01 MED ORDER — CEFAZOLIN SODIUM 1-5 GM-% IV SOLN
1.0000 g | Freq: Four times a day (QID) | INTRAVENOUS | Status: AC
  Administered 2014-02-01 – 2014-02-02 (×2): 1 g via INTRAVENOUS
  Filled 2014-02-01 (×2): qty 50

## 2014-02-01 MED ORDER — HEPARIN SODIUM (PORCINE) 5000 UNIT/ML IJ SOLN
5000.0000 [IU] | Freq: Three times a day (TID) | INTRAMUSCULAR | Status: DC
  Filled 2014-02-01 (×4): qty 1

## 2014-02-01 MED ORDER — PHENOL 1.4 % MT LIQD
1.0000 | OROMUCOSAL | Status: DC | PRN
  Filled 2014-02-01: qty 177

## 2014-02-01 MED ORDER — MORPHINE SULFATE 2 MG/ML IJ SOLN
1.0000 mg | INTRAMUSCULAR | Status: DC | PRN
Start: 2014-02-01 — End: 2014-02-01
  Administered 2014-02-01 (×2): 1 mg via INTRAVENOUS
  Filled 2014-02-01 (×2): qty 1

## 2014-02-01 MED ORDER — VITAMIN D 1000 UNITS PO TABS
1000.0000 [IU] | ORAL_TABLET | Freq: Two times a day (BID) | ORAL | Status: DC
Start: 2014-02-01 — End: 2014-02-04
  Administered 2014-02-01 – 2014-02-04 (×6): 1000 [IU] via ORAL
  Filled 2014-02-01 (×8): qty 1

## 2014-02-01 MED ORDER — FERROUS SULFATE 325 (65 FE) MG PO TABS
325.0000 mg | ORAL_TABLET | Freq: Two times a day (BID) | ORAL | Status: DC
  Administered 2014-02-01 – 2014-02-04 (×5): 325 mg via ORAL
  Filled 2014-02-01 (×8): qty 1

## 2014-02-01 MED ORDER — DEXTROSE-NACL 5-0.9 % IV SOLN
INTRAVENOUS | Status: DC
  Administered 2014-02-01: 75 mL/h via INTRAVENOUS
  Administered 2014-02-01: 20:00:00 via INTRAVENOUS

## 2014-02-01 MED ORDER — VITAMIN B-12 1000 MCG PO TABS
1000.0000 ug | ORAL_TABLET | Freq: Every day | ORAL | Status: DC
  Administered 2014-02-02 – 2014-02-04 (×3): 1000 ug via ORAL
  Filled 2014-02-01 (×4): qty 1

## 2014-02-01 MED ORDER — HYDROCODONE-ACETAMINOPHEN 5-325 MG PO TABS
1.0000 | ORAL_TABLET | ORAL | Status: DC | PRN
  Administered 2014-02-02: 1 via ORAL
  Filled 2014-02-01: qty 1

## 2014-02-01 MED ORDER — ENOXAPARIN SODIUM 40 MG/0.4ML ~~LOC~~ SOLN
40.0000 mg | SUBCUTANEOUS | Status: DC
  Administered 2014-02-02: 40 mg via SUBCUTANEOUS
  Filled 2014-02-01 (×3): qty 0.4

## 2014-02-01 MED ORDER — PHENYLEPHRINE HCL 10 MG/ML IJ SOLN
INTRAMUSCULAR | Status: DC | PRN
  Administered 2014-02-01 (×4): 80 ug via INTRAVENOUS

## 2014-02-01 MED ORDER — METOCLOPRAMIDE HCL 5 MG/ML IJ SOLN: 5.0000 mg | Freq: Three times a day (TID) | INTRAMUSCULAR | Status: DC | PRN

## 2014-02-01 MED ORDER — METHOCARBAMOL 500 MG PO TABS
500.0000 mg | ORAL_TABLET | Freq: Four times a day (QID) | ORAL | Status: DC | PRN
  Administered 2014-02-02: 500 mg via ORAL
  Filled 2014-02-01: qty 1

## 2014-02-01 MED ORDER — MORPHINE SULFATE 2 MG/ML IJ SOLN: 1.0000 mg | INTRAMUSCULAR | Status: DC | PRN

## 2014-02-01 MED ORDER — FUROSEMIDE 20 MG PO TABS
10.0000 mg | ORAL_TABLET | Freq: Every day | ORAL | Status: DC
  Administered 2014-02-02 – 2014-02-04 (×3): 10 mg via ORAL
  Filled 2014-02-01 (×4): qty 0.5

## 2014-02-01 MED ORDER — ACETAMINOPHEN 650 MG RE SUPP: 650.0000 mg | Freq: Four times a day (QID) | RECTAL | Status: DC | PRN

## 2014-02-01 MED ORDER — PHENYLEPHRINE HCL 10 MG/ML IJ SOLN: 30.0000 ug/min | INTRAVENOUS | Status: DC

## 2014-02-01 MED ORDER — AMLODIPINE BESYLATE 5 MG PO TABS
5.0000 mg | ORAL_TABLET | Freq: Every evening | ORAL | Status: DC
  Administered 2014-02-02 – 2014-02-03 (×2): 5 mg via ORAL
  Filled 2014-02-01 (×4): qty 1

## 2014-02-01 MED ORDER — PROPOFOL INFUSION 10 MG/ML OPTIME
INTRAVENOUS | Status: DC | PRN
  Administered 2014-02-01: 50 ug/kg/min via INTRAVENOUS

## 2014-02-01 MED ORDER — METOCLOPRAMIDE HCL 10 MG PO TABS: 5.0000 mg | ORAL_TABLET | Freq: Three times a day (TID) | ORAL | Status: DC | PRN

## 2014-02-01 MED ORDER — SODIUM CHLORIDE 0.9 % IR SOLN
Status: DC | PRN
  Administered 2014-02-01: 2000 mL

## 2014-02-01 MED ORDER — CEFAZOLIN (ANCEF) 1 G IV SOLR: 1.0000 g | INTRAVENOUS | Status: DC

## 2014-02-01 MED ORDER — ACETAMINOPHEN 10 MG/ML IV SOLN
1000.0000 mg | Freq: Once | INTRAVENOUS | Status: AC
  Administered 2014-02-01: 1000 mg via INTRAVENOUS
  Filled 2014-02-01: qty 100

## 2014-02-01 MED ORDER — PHENYLEPHRINE HCL 10 MG/ML IJ SOLN
INTRAMUSCULAR | Status: AC
  Filled 2014-02-01: qty 1

## 2014-02-01 MED ORDER — CEFAZOLIN SODIUM-DEXTROSE 2-3 GM-% IV SOLR
2.0000 g | INTRAVENOUS | Status: AC
  Administered 2014-02-01: 2 g via INTRAVENOUS
  Filled 2014-02-01: qty 50

## 2014-02-01 MED ORDER — ONDANSETRON HCL 4 MG/2ML IJ SOLN: 4.0000 mg | Freq: Four times a day (QID) | INTRAMUSCULAR | Status: DC | PRN

## 2014-02-01 MED ORDER — LEVOTHYROXINE SODIUM 25 MCG PO TABS
25.0000 ug | ORAL_TABLET | Freq: Every day | ORAL | Status: DC
Start: 2014-02-01 — End: 2014-02-04
  Administered 2014-02-01 – 2014-02-04 (×4): 25 ug via ORAL
  Filled 2014-02-01 (×7): qty 1

## 2014-02-01 MED ORDER — PROMETHAZINE HCL 25 MG/ML IJ SOLN: 6.2500 mg | INTRAMUSCULAR | Status: DC | PRN

## 2014-02-01 MED ORDER — ONDANSETRON HCL 4 MG PO TABS: 4.0000 mg | ORAL_TABLET | Freq: Four times a day (QID) | ORAL | Status: DC | PRN

## 2014-02-01 MED ORDER — PROPOFOL 10 MG/ML IV BOLUS
INTRAVENOUS | Status: DC | PRN
  Administered 2014-02-01 (×2): 20 mg via INTRAVENOUS

## 2014-02-01 MED ORDER — OCUVITE PO TABS
1.0000 | ORAL_TABLET | Freq: Every day | ORAL | Status: DC
  Administered 2014-02-02 – 2014-02-04 (×3): 1 via ORAL
  Filled 2014-02-01 (×5): qty 1

## 2014-02-01 MED ORDER — DOCUSATE SODIUM 100 MG PO CAPS
100.0000 mg | ORAL_CAPSULE | Freq: Two times a day (BID) | ORAL | Status: DC
  Administered 2014-02-01 – 2014-02-04 (×5): 100 mg via ORAL

## 2014-02-01 MED ORDER — CEFAZOLIN SODIUM-DEXTROSE 2-3 GM-% IV SOLR
INTRAVENOUS | Status: AC
  Filled 2014-02-01: qty 50

## 2014-02-01 MED ORDER — BISACODYL 10 MG RE SUPP: 10.0000 mg | Freq: Every day | RECTAL | Status: DC | PRN

## 2014-02-01 MED ORDER — POLYETHYLENE GLYCOL 3350 17 G PO PACK: 17.0000 g | PACK | Freq: Every day | ORAL | Status: DC | PRN

## 2014-02-01 MED ORDER — DEXTROSE 5 % IV SOLN
500.0000 mg | Freq: Four times a day (QID) | INTRAVENOUS | Status: DC | PRN
  Filled 2014-02-01: qty 5

## 2014-02-01 MED ORDER — LACTATED RINGERS IV SOLN
INTRAVENOUS | Status: DC
  Administered 2014-02-01: 1000 mL via INTRAVENOUS

## 2014-02-01 MED ORDER — MENTHOL 3 MG MT LOZG
1.0000 | LOZENGE | OROMUCOSAL | Status: DC | PRN
  Filled 2014-02-01: qty 9

## 2014-02-01 MED ORDER — FLEET ENEMA 7-19 GM/118ML RE ENEM: 1.0000 | ENEMA | Freq: Once | RECTAL | Status: AC | PRN

## 2014-02-01 SURGICAL SUPPLY — 52 items
BAG ZIPLOCK 12X15 (MISCELLANEOUS) ×2 IMPLANT
BIT DRILL 2.8X128 (BIT) ×2 IMPLANT
BLADE SAW SAG 73X25 THK (BLADE) ×1
BLADE SAW SGTL 73X25 THK (BLADE) ×1 IMPLANT
BRUSH FEMORAL CANAL (MISCELLANEOUS) ×2 IMPLANT
CAPT HIP HEMI 1 ×2 IMPLANT
CEMENT BONE DEPUY (Cement) ×2 IMPLANT
CEMENT RESTRICTOR DEPUY SZ 3 (Cement) ×2 IMPLANT
DRAPE INCISE IOBAN 66X45 STRL (DRAPES) ×2 IMPLANT
DRAPE ORTHO SPLIT 77X108 STRL (DRAPES) ×1
DRAPE POUCH INSTRU U-SHP 10X18 (DRAPES) ×2 IMPLANT
DRAPE SURG ORHT 6 SPLT 77X108 (DRAPES) ×1 IMPLANT
DRAPE U-SHAPE 47X51 STRL (DRAPES) ×2 IMPLANT
DRSG ADAPTIC 3X8 NADH LF (GAUZE/BANDAGES/DRESSINGS) ×2 IMPLANT
DRSG EMULSION OIL 3X16 NADH (GAUZE/BANDAGES/DRESSINGS) IMPLANT
DRSG MEPILEX BORDER 4X4 (GAUZE/BANDAGES/DRESSINGS) ×2 IMPLANT
DRSG MEPILEX BORDER 4X8 (GAUZE/BANDAGES/DRESSINGS) ×2 IMPLANT
DRSG PAD ABDOMINAL 8X10 ST (GAUZE/BANDAGES/DRESSINGS) ×2 IMPLANT
DURAPREP 26ML APPLICATOR (WOUND CARE) ×2 IMPLANT
ELECT REM PT RETURN 9FT ADLT (ELECTROSURGICAL) ×2
ELECTRODE REM PT RTRN 9FT ADLT (ELECTROSURGICAL) ×1 IMPLANT
EVACUATOR 1/8 PVC DRAIN (DRAIN) ×2 IMPLANT
FACESHIELD WRAPAROUND (MASK) ×6 IMPLANT
GAUZE SPONGE 4X4 12PLY STRL (GAUZE/BANDAGES/DRESSINGS) IMPLANT
GLOVE BIO SURGEON STRL SZ7.5 (GLOVE) ×2 IMPLANT
GLOVE BIO SURGEON STRL SZ8 (GLOVE) ×4 IMPLANT
GLOVE BIOGEL PI IND STRL 8 (GLOVE) ×1 IMPLANT
GLOVE BIOGEL PI INDICATOR 8 (GLOVE) ×1
GOWN STRL REUS W/TWL LRG LVL3 (GOWN DISPOSABLE) ×2 IMPLANT
GOWN STRL REUS W/TWL XL LVL3 (GOWN DISPOSABLE) IMPLANT
HANDPIECE INTERPULSE COAX TIP (DISPOSABLE) ×1
IMMOBILIZER KNEE 20 (SOFTGOODS)
IMMOBILIZER KNEE 20 THIGH 36 (SOFTGOODS) IMPLANT
KIT BASIN OR (CUSTOM PROCEDURE TRAY) ×2 IMPLANT
MANIFOLD NEPTUNE II (INSTRUMENTS) ×2 IMPLANT
NS IRRIG 1000ML POUR BTL (IV SOLUTION) ×2 IMPLANT
PACK TOTAL JOINT (CUSTOM PROCEDURE TRAY) ×2 IMPLANT
PADDING CAST COTTON 6X4 STRL (CAST SUPPLIES) IMPLANT
PASSER SUT SWANSON 36MM LOOP (INSTRUMENTS) ×2 IMPLANT
POSITIONER SURGICAL ARM (MISCELLANEOUS) ×2 IMPLANT
PRESSURIZER FEMORAL UNIV (MISCELLANEOUS) ×2 IMPLANT
SET HNDPC FAN SPRY TIP SCT (DISPOSABLE) ×1 IMPLANT
STRIP CLOSURE SKIN 1/2X4 (GAUZE/BANDAGES/DRESSINGS) ×2 IMPLANT
SUT ETHIBOND NAB CT1 #1 30IN (SUTURE) ×4 IMPLANT
SUT MNCRL AB 4-0 PS2 18 (SUTURE) ×2 IMPLANT
SUT VIC AB 1 CT1 27 (SUTURE) ×3
SUT VIC AB 1 CT1 27XBRD ANTBC (SUTURE) ×3 IMPLANT
SUT VIC AB 2-0 CT1 27 (SUTURE) ×3
SUT VIC AB 2-0 CT1 TAPERPNT 27 (SUTURE) ×3 IMPLANT
TOWEL OR 17X26 10 PK STRL BLUE (TOWEL DISPOSABLE) ×4 IMPLANT
TOWER CARTRIDGE SMART MIX (DISPOSABLE) ×2 IMPLANT
WATER STERILE IRR 1500ML POUR (IV SOLUTION) ×2 IMPLANT

## 2014-02-01 NOTE — Progress Notes (Signed)
OT Cancellation Note  Patient Details Name: [name] MRN: [id number] DOB: 02/16/1920   Cancelled Treatment:    Reason Eval/Treat Not Completed: Other (comment).  Please reorder OT after surgery.  Thank you.  Nabeeha Badertscher 02/01/2014, 10:56 AM  Lesle Chris, OTR/L [phone] 02/01/2014

## 2014-02-01 NOTE — H&P (Addendum)
[facility] History and Physical  [name] OYD:[id number] DOB: 1920/05/01 DOA: 01/31/2014  Referring physician: ED physician PCP: Ria Bush, MD  Specialists:   Chief Complaint: left leg pain after fall   HPI: [name] is a 81 y.o. female with past medical history of hypertension, hyperlipidemia, diastolic congestive heart failure, and remote colon cancer 2008, hypothyroidism, chronic kidney disease-stage III, who presents with left leg pain after fall.  Patient reports that at about 8:30 PM, she was in her bedroom, she had fall when she turned around and lost her balance. She denies any palpitation, unilateral weakness or numbness in her extremities before the event. Patient family reports hearing fall and immediately coming to patient's side. Patient denies any injury to her head. No loss of consciousness. Patient developed pain over left hip, and was unable to walk due to pain. Patient denies fever, chills, headaches, cough, chest pain, SOB, abdominal pain, diarrhea, dysuria, urgency, frequency, hematuria, skin rashes.   Work up in the ED demonstrates negative chest x-ray for acute abnormalities. X-ray of left hip showed impacted displaced left femoral neck fracture. Patient is admitted to inpatient for further evaluation and treatment. Orthopedic surgeon was consulted by ED.  Review of Systems: As presented in the history of presenting illness, rest negative.  Where does patient live? At home with her daughter Can patient participate in ADLs? none  Allergy: No Known Allergies  Past Medical History  Diagnosis Date  . Hypertension 10/1998  . Hyperlipidemia 12/2002  . Diastolic CHF   . Vaginal delivery     x4, at home  . Anemia 12/30-01/25/2006    [facility]- CHF- started lasix  . History of colon cancer     s/p partial colectomy  . History of ETT 03/15/2006    adenosine myoview low risk  . Hypothyroidism   . Microhematuria 03/2013    watchful waiting  (MacDiarmid)  . CKD (chronic kidney disease), stage III 03/04/2010    Past Surgical History  Procedure Laterality Date  . Cholecystectomy  1984  . Partial colectomy  03/25/2006    right  . Esophagogastroduodenoscopy      B9 polyp H.Pylori bx- "dilated" polyps  . History of abd  02/25/2006    bilat renal cysts, no mets  . Ct head limited w/o cm  02/2011    no acute process.  Mild to moderate cortical volume loss and scattered small vessel ischemic microangiopathy  . Colonoscopy  03/2010    1 tubular adenoma, int hemorrhoids, f/u prn [name]    Social History:  reports that she has never smoked. She has never used smokeless tobacco. She reports that she does not drink alcohol or use illicit drugs.  Family History:  Family History  Problem Relation Age of Onset  . Stroke Sister   . Diabetes Sister   . Hypertension Sister   . Stroke Mother   . Alcohol abuse Brother      Prior to Admission medications   Medication Sig Start Date End Date Taking? Authorizing Provider  amLODipine (NORVASC) 5 MG tablet TAKE 1 TABLET EVERY EVENING 01/03/14  Yes Ria Bush, MD  aspirin EC 81 MG tablet Take 81 mg by mouth daily.    Yes Historical Provider, MD  beta carotene w/minerals (OCUVITE) tablet Take 1 tablet by mouth daily.   Yes Historical Provider, MD  Cholecalciferol (VITAMIN D3) 1000 UNITS CAPS Take by mouth 2 (two) times daily.     Yes Historical Provider, MD  ferrous sulfate 325 (65  FE) MG tablet Take 325 mg by mouth 2 (two) times daily.     Yes Historical Provider, MD  furosemide (LASIX) 20 MG tablet TAKE 1/2 TABLET EVERY MORNING. 12/03/13  Yes Ria Bush, MD  levothyroxine (SYNTHROID, LEVOTHROID) 25 MCG tablet TAKE AS DIRECTED ON [place].HOLD ON WEEKENDS.TAKE BEFORE BREAKFAST 11/08/13  Yes Ria Bush, MD  lisinopril (PRINIVIL,ZESTRIL) 5 MG tablet TAKE 1 TABLET EVERY DAY 12/05/13  Yes Ria Bush, MD  vitamin B-12 (CYANOCOBALAMIN) 1000 MCG tablet Take 1,000 mcg  by mouth daily.     Yes Historical Provider, MD    Physical Exam: Filed Vitals:   01/31/14 2156 01/31/14 2157  BP: 122/58 141/55  Pulse: 72 78  Temp:  97.6 F (36.4 C)  TempSrc:  Oral  Resp: 18 16  SpO2:  100%   General: Not in acute distress HEENT:       Eyes: PERRL, EOMI, no scleral icterus       ENT: No discharge from the ears and nose, no pharynx injection, no tonsillar enlargement.        Neck: No JVD, no bruit, no mass felt. Cardiac: S1/S2, RRR, No murmurs, No gallops or rubs Pulm: Good air movement bilaterally. Clear to auscultation bilaterally. No rales, wheezing, rhonchi or rubs. Abd: Soft, nondistended, nontender, no rebound pain, no organomegaly, BS present Ext: trace leg edema bilaterally. 2+DP/PT pulse bilaterally Musculoskeletal: No joint deformities, erythema, or stiffness, ROM full Musculoskeletal:   Left hip: She exhibits tenderness and deformity over left hip and upper thigh. Left hip shortened and externally rotated.  Skin: No rashes.  Neuro: Alert and oriented X3, cranial nerves II-XII grossly intact, muscle strength 5/5 in all extremeties, sensation to light touch intact. Brachial reflex 2+ bilaterally. Knee reflex 1+ bilaterally. Negative Babinski's sign. Normal finger to nose test. Psych: Patient is not psychotic, no suicidal or hemocidal ideation.  Labs on Admission:  Basic Metabolic Panel:  Recent Labs Lab 01/31/14 2246  NA 137  K 4.5  CL 104  CO2 25  GLUCOSE 142*  BUN 32*  CREATININE 1.38*  CALCIUM 9.2   Liver Function Tests: No results for input(s): AST, ALT, ALKPHOS, BILITOT, PROT, ALBUMIN in the last 168 hours. No results for input(s): LIPASE, AMYLASE in the last 168 hours. No results for input(s): AMMONIA in the last 168 hours. CBC:  Recent Labs Lab 01/31/14 2246  WBC 10.0  NEUTROABS 7.2  HGB 11.8*  HCT 35.3*  MCV 94.9  PLT 130*   Cardiac Enzymes: No results for input(s): CKTOTAL, CKMB, CKMBINDEX, TROPONINI in the last 168  hours.  BNP (last 3 results) No results for input(s): PROBNP in the last 8760 hours. CBG: No results for input(s): GLUCAP in the last 168 hours.  Radiological Exams on Admission: Dg Chest 1 View  01/31/2014   CLINICAL DATA:  Status post fall. Concern for chest injury. Initial encounter.  EXAM: CHEST - 1 VIEW  COMPARISON:  Chest radiograph performed 03/12/2011  FINDINGS: The lungs are well-aerated hyperexpanded, with flattening of the hemidiaphragms, likely reflecting COPD. Minimal left basilar atelectasis or scarring is again noted. There is no evidence of pleural effusion or pneumothorax.  The cardiomediastinal silhouette is borderline normal in size. No acute osseous abnormalities are seen.  IMPRESSION: Findings suggest COPD. Minimal left basilar atelectasis or scarring noted. No displaced rib fracture seen.   Electronically Signed   By: Garald Balding M.D.   On: 01/31/2014 23:38   Dg Hip Unilat With Pelvis 2-3 Views Left  01/31/2014  CLINICAL DATA:  Lost balance and fell today at home while taking of shoes. LEFT hip pain.  EXAM: DG HIP W/ PELVIS 2-3V*L*  COMPARISON:  None available for comparison at time of study interpretation.  FINDINGS: LEFT femoral neck fracture with impaction, varus angulation distal bony fragments. No dislocation. No destructive bony lesions. Soft tissue planes are nonsuspicious.  IMPRESSION: Impacted displaced LEFT femoral neck fracture without dislocation.   Electronically Signed   By: Elon Alas   On: 01/31/2014 23:41    EKG: Independently reviewed. Occasional PVC  Assessment/Plan Principal Problem:   Closed displaced fracture of left femoral neck Active Problems:   History of pernicious anemia   Essential hypertension   Chronic diastolic CHF (congestive heart failure)   CKD (chronic kidney disease), stage III   Personal history of malignant neoplasm of gastrointestinal tract   Hypothyroidism  Left femoral neck fracture: As evidenced by x-ray. Currently  patient has moderate pain. ED consulted orthopedic surgeon. - will admit to med-surg bed - Pain control: percocet and morphine prn - follow up ortho recs - NPO after MN - type and cross - INR/PTT  Hypertension: -Continue amlodipine, lisinopril -Patient is also on Lasix for congestive heart failure  Diastolic congestive heart failure: 2-D echo on 04/18/06 showed EF 60-65%.  Patient is on very low-dose of Lasix, 10 mg daily. She is euvolemic on admission. -continue Lasix 10 mg daily, -Check BNP  CKD-III: Baseline creatinine 1.5. Her creatinine is 1.38 on admission, which is at her baseline -follow up renal Fx by BMP  Pernicious anemia: Hemoglobin 11.8, -Continue vitamin B12  Hypothyroidism: Patient is on Synthroid at home. TSH was 3.21 on 11/06/13 -Continue Synthroid   DVT ppx: SQ Heparin       Code Status: partial: OK with CPR, but no intubation Family Communication:   Yes, patient's  daughter     at bed side Disposition Plan: Admit to inpatient   Date of Service 02/01/2014    Ivor Costa [facility] Pager [phone]  If 7PM-7AM, please contact night-coverage [email or web address] Password [facility] 02/01/2014, 12:24 AM

## 2014-02-01 NOTE — Progress Notes (Signed)
[facility] PROGRESS NOTE  [name] LEX:[id number] DOB: 12-08-1920 DOA: 01/31/2014 PCP: Ria Bush, MD  Assessment/Plan: 1. L femoral neck fracture 1. Via likely mechanical fall 2. Orthopedic surgery now following, plans for surgery later this evening 3. Pt is asymptomatic. No sob. Lungs clear. EKG unremarkable. CXR is clear. Pt is medically clear for surgery. 2. HTN 1. BP stable and controlled 3. Chronic diastolic CHF 1. Appears euvolemic 2. Cont to monitor 4. CKD3 1. Cr stable 2. Avoid nephrotoxic agents 3. Cont to follow 5. Pernicious anemia 1. hgb stable at around 11 2. Cont to monitor 6. Hypothyroid 1. Continue thyroid replacement 7. DVT prophylaxis 1. Heparin subq  Code Status: Partial - no intubation but OK for CPR, cardioversion Family Communication: Pt in room, family at bedside (indicate person spoken with, relationship, and if by phone, the number) Disposition Plan: Pending   Consultants:  Orthopedic surgery  Procedures:    Antibiotics:  n (indicate start date, and stop date if known)  HPI/Subjective: No complaints. Pain well controlled  Objective: Filed Vitals:   02/01/14 0133 02/01/14 0215 02/01/14 0530 02/01/14 1429  BP: 160/90 151/61 119/49 134/56  Pulse: 86 95 88 84  Temp: 97.8 F (36.6 C) 98.6 F (37 C) 99.9 F (37.7 C) 98.6 F (37 C)  TempSrc: Oral Oral Oral Oral  Resp: 16 20 20 16   Height:      Weight:      SpO2: 98% 98% 97% 97%    Intake/Output Summary (Last 24 hours) at 02/01/14 1539 Last data filed at 02/01/14 1430  Gross per 24 hour  Intake      0 ml  Output   1175 ml  Net  -1175 ml   Filed Weights   02/01/14 0034  Weight: 61.236 kg (135 lb)    Exam:   General:  Awake, in nad  Cardiovascular: regular, s1, s2  Respiratory: normal resp effort, no wheezing  Abdomen: soft, nondistended  Musculoskeletal: perfused, no clubbing   Data Reviewed: Basic Metabolic Panel:  Recent Labs Lab  01/31/14 2246 02/01/14 0503  NA 137 140  K 4.5 4.2  CL 104 107  CO2 25 25  GLUCOSE 142* 124*  BUN 32* 29*  CREATININE 1.38* 1.20*  CALCIUM 9.2 9.0   Liver Function Tests: No results for input(s): AST, ALT, ALKPHOS, BILITOT, PROT, ALBUMIN in the last 168 hours. No results for input(s): LIPASE, AMYLASE in the last 168 hours. No results for input(s): AMMONIA in the last 168 hours. CBC:  Recent Labs Lab 01/31/14 2246 02/01/14 0503  WBC 10.0 10.6*  NEUTROABS 7.2  --   HGB 11.8* 10.9*  HCT 35.3* 32.3*  MCV 94.9 94.2  PLT 130* 121*   Cardiac Enzymes: No results for input(s): CKTOTAL, CKMB, CKMBINDEX, TROPONINI in the last 168 hours. BNP (last 3 results) No results for input(s): PROBNP in the last 8760 hours. CBG:  Recent Labs Lab 02/01/14 0740  GLUCAP 115*    Recent Results (from the past 240 hour(s))  Surgical pcr screen     Status: None   Collection Time: 02/01/14 10:49 AM  Result Value Ref Range Status   MRSA, PCR NEGATIVE NEGATIVE Final   Staphylococcus aureus NEGATIVE NEGATIVE Final    Comment:        The Xpert SA Assay (FDA approved for NASAL specimens in patients over 78 years of age), is one component of a comprehensive surveillance program.  Test performance has been validated by [employer] for patients greater than  or equal to 9 year old. It is not intended to diagnose infection nor to guide or monitor treatment.      Studies: Dg Chest 1 View  01/31/2014   CLINICAL DATA:  Status post fall. Concern for chest injury. Initial encounter.  EXAM: CHEST - 1 VIEW  COMPARISON:  Chest radiograph performed 03/12/2011  FINDINGS: The lungs are well-aerated hyperexpanded, with flattening of the hemidiaphragms, likely reflecting COPD. Minimal left basilar atelectasis or scarring is again noted. There is no evidence of pleural effusion or pneumothorax.  The cardiomediastinal silhouette is borderline normal in size. No acute osseous abnormalities are seen.   IMPRESSION: Findings suggest COPD. Minimal left basilar atelectasis or scarring noted. No displaced rib fracture seen.   Electronically Signed   By: Garald Balding M.D.   On: 01/31/2014 23:38   Dg Hip Unilat With Pelvis 2-3 Views Left  01/31/2014   CLINICAL DATA:  Lost balance and fell today at home while taking of shoes. LEFT hip pain.  EXAM: DG HIP W/ PELVIS 2-3V*L*  COMPARISON:  None available for comparison at time of study interpretation.  FINDINGS: LEFT femoral neck fracture with impaction, varus angulation distal bony fragments. No dislocation. No destructive bony lesions. Soft tissue planes are nonsuspicious.  IMPRESSION: Impacted displaced LEFT femoral neck fracture without dislocation.   Electronically Signed   By: Elon Alas   On: 01/31/2014 23:41    Scheduled Meds: . amLODipine  5 mg Oral QPM  . beta carotene w/minerals  1 tablet Oral Daily  .  ceFAZolin (ANCEF) IV  2 g Intravenous 30 min Pre-Op  . cholecalciferol  1,000 Units Oral BID  . ferrous sulfate  325 mg Oral BID  . furosemide  10 mg Oral Daily  . levothyroxine  25 mcg Oral QAC breakfast  . vitamin B-12  1,000 mcg Oral Daily   Continuous Infusions: . dextrose 5 % and 0.9% NaCl 75 mL/hr (02/01/14 0856)    Principal Problem:   Closed displaced fracture of left femoral neck Active Problems:   History of pernicious anemia   Essential hypertension   Chronic diastolic CHF (congestive heart failure)   CKD (chronic kidney disease), stage III   Personal history of malignant neoplasm of gastrointestinal tract   Hypothyroidism   Left displaced femoral neck fracture  Time spent: 23min  Nassim Cosma, [facility] Pager [phone]. If 7PM-7AM, please contact night-coverage at [email or web address], password [facility] 02/01/2014, 3:39 PM  LOS: 1 day

## 2014-02-01 NOTE — Interval H&P Note (Signed)
History and Physical Interval Note:  02/01/2014 5:19 PM  [name]  has presented today for surgery, with the diagnosis of left hip fracture  The various methods of treatment have been discussed with the patient and family. After consideration of risks, benefits and other options for treatment, the patient has consented to  Procedure(s): ARTHROPLASTY BIPOLAR HIP (Left) as a surgical intervention .  The patient's history has been reviewed, patient examined, no change in status, stable for surgery.  I have reviewed the patient's chart and labs.  Questions were answered to the patient's satisfaction.     [name]

## 2014-02-01 NOTE — Progress Notes (Signed)
Utilization review completed.  

## 2014-02-01 NOTE — H&P (View-Only) (Signed)
Reason for Consult: Left hip injury following fall at home. Referring Physician: Ivor Costa, MD  ANIAYAH ALANIZ is an 81 y.o. female.  HPI: Patient is a 81 year old female that sustained a left hip injury last night while at home in her bedroom.  Her family that lives with her found her on the floor.  She was in her bedroom and turned around at her closet and lost her balance.  She had immediate onset of left hip pain.  Brought to [facility] wher she was found to have a displaced left femoral neck fracture.  Patient was admitted by medicine and ortho consulted.  Patient seen today and appears comfortable.  Radiographs reviewed and situation discussed with the patient and family.  Past Medical History  Diagnosis Date  . Hypertension 10/1998  . Hyperlipidemia 12/2002  . Diastolic CHF   . Vaginal delivery     x4, at home  . Anemia 12/30-01/25/2006    [facility]- CHF- started lasix  . History of colon cancer     s/p partial colectomy  . History of ETT 03/15/2006    adenosine myoview low risk  . Hypothyroidism   . Microhematuria 03/2013    watchful waiting (MacDiarmid)  . CKD (chronic kidney disease), stage III 03/04/2010    Past Surgical History  Procedure Laterality Date  . Cholecystectomy  1984  . Partial colectomy  03/25/2006    right  . Esophagogastroduodenoscopy      B9 polyp H.Pylori bx- "dilated" polyps  . History of abd  02/25/2006    bilat renal cysts, no mets  . Ct head limited w/o cm  02/2011    no acute process.  Mild to moderate cortical volume loss and scattered small vessel ischemic microangiopathy  . Colonoscopy  03/2010    1 tubular adenoma, int hemorrhoids, f/u prn [name]    Family History  Problem Relation Age of Onset  . Stroke Sister   . Diabetes Sister   . Hypertension Sister   . Stroke Mother   . Alcohol abuse Brother     Social History:  reports that she has never smoked. She has never used smokeless tobacco. She reports that she does not drink alcohol or use illicit  drugs.  Lives at home with family.  Allergies: No Known Allergies  Medications:  Amlodipine 5 mg Aspirin 81 mg Ocuvite Vitamin D3 Ferrous Sulfate 325 mg Lasix 20 mg Synthroid 25 mcg Lisinopril 5 mg Vitamin B12   Results for orders placed or performed during the hospital encounter of 01/31/14 (from the past 48 hour(s))  Basic metabolic panel     Status: Abnormal   Collection Time: 01/31/14 10:46 PM  Result Value Ref Range   Sodium 137 135 - 145 mmol/L    Comment: Please note change in reference range.   Potassium 4.5 3.5 - 5.1 mmol/L    Comment: Please note change in reference range.   Chloride 104 96 - 112 mEq/L   CO2 25 19 - 32 mmol/L   Glucose, Bld 142 (H) 70 - 99 mg/dL   BUN 32 (H) 6 - 23 mg/dL   Creatinine, Ser 1.38 (H) 0.50 - 1.10 mg/dL   Calcium 9.2 8.4 - 10.5 mg/dL   GFR calc non Af Amer 32 (L) >90 mL/min   GFR calc Af Amer 37 (L) >90 mL/min    Comment: (NOTE) The eGFR has been calculated using the CKD EPI equation. This calculation has not been validated in all clinical situations. eGFR's persistently <  90 mL/min signify possible Chronic Kidney Disease.    Anion gap 8 5 - 15  CBC WITH DIFFERENTIAL     Status: Abnormal   Collection Time: 01/31/14 10:46 PM  Result Value Ref Range   WBC 10.0 4.0 - 10.5 K/uL   RBC 3.72 (L) 3.87 - 5.11 MIL/uL   Hemoglobin 11.8 (L) 12.0 - 15.0 g/dL   HCT 35.3 (L) 36.0 - 46.0 %   MCV 94.9 78.0 - 100.0 fL   MCH 31.7 26.0 - 34.0 pg   MCHC 33.4 30.0 - 36.0 g/dL   RDW 12.7 11.5 - 15.5 %   Platelets 130 (L) 150 - 400 K/uL   Neutrophils Relative % 72 43 - 77 %   Neutro Abs 7.2 1.7 - 7.7 K/uL   Lymphocytes Relative 19 12 - 46 %   Lymphs Abs 1.9 0.7 - 4.0 K/uL   Monocytes Relative 7 3 - 12 %   Monocytes Absolute 0.7 0.1 - 1.0 K/uL   Eosinophils Relative 2 0 - 5 %   Eosinophils Absolute 0.2 0.0 - 0.7 K/uL   Basophils Relative 0 0 - 1 %   Basophils Absolute 0.0 0.0 - 0.1 K/uL  Urinalysis, Routine w reflex microscopic     Status:  Abnormal   Collection Time: 01/31/14 11:24 PM  Result Value Ref Range   Color, Urine YELLOW YELLOW   APPearance CLEAR CLEAR   Specific Gravity, Urine 1.017 1.005 - 1.030   pH 5.5 5.0 - 8.0   Glucose, UA NEGATIVE NEGATIVE mg/dL   Hgb urine dipstick NEGATIVE NEGATIVE   Bilirubin Urine NEGATIVE NEGATIVE   Ketones, ur NEGATIVE NEGATIVE mg/dL   Protein, ur NEGATIVE NEGATIVE mg/dL   Urobilinogen, UA 1.0 0.0 - 1.0 mg/dL   Nitrite NEGATIVE NEGATIVE   Leukocytes, UA SMALL (A) NEGATIVE  Urine microscopic-add on     Status: Abnormal   Collection Time: 01/31/14 11:24 PM  Result Value Ref Range   Squamous Epithelial / LPF RARE RARE   WBC, UA 7-10 <3 WBC/hpf   Bacteria, UA MANY (A) RARE  ABO/Rh     Status: None   Collection Time: 02/01/14  2:30 AM  Result Value Ref Range   ABO/RH(D) A POS   Protime-INR     Status: None   Collection Time: 02/01/14  5:03 AM  Result Value Ref Range   Prothrombin Time 13.1 11.6 - 15.2 seconds   INR 0.98 0.00 - 1.49  APTT     Status: None   Collection Time: 02/01/14  5:03 AM  Result Value Ref Range   aPTT 25 24 - 37 seconds  Type and screen     Status: None   Collection Time: 02/01/14  5:03 AM  Result Value Ref Range   ABO/RH(D) A POS    Antibody Screen NEG    Sample Expiration 02/04/2014   Brain natriuretic peptide     Status: None   Collection Time: 02/01/14  5:03 AM  Result Value Ref Range   B Natriuretic Peptide 80.2 0.0 - 100.0 pg/mL    Comment: Please note change in reference range.  Basic metabolic panel     Status: Abnormal   Collection Time: 02/01/14  5:03 AM  Result Value Ref Range   Sodium 140 135 - 145 mmol/L    Comment: Please note change in reference range.   Potassium 4.2 3.5 - 5.1 mmol/L    Comment: Please note change in reference range.   Chloride 107 96 - 112 mEq/L  CO2 25 19 - 32 mmol/L   Glucose, Bld 124 (H) 70 - 99 mg/dL   BUN 29 (H) 6 - 23 mg/dL   Creatinine, Ser 1.20 (H) 0.50 - 1.10 mg/dL   Calcium 9.0 8.4 - 10.5 mg/dL    GFR calc non Af Amer 38 (L) >90 mL/min   GFR calc Af Amer 44 (L) >90 mL/min    Comment: (NOTE) The eGFR has been calculated using the CKD EPI equation. This calculation has not been validated in all clinical situations. eGFR's persistently <90 mL/min signify possible Chronic Kidney Disease.    Anion gap 8 5 - 15  CBC     Status: Abnormal   Collection Time: 02/01/14  5:03 AM  Result Value Ref Range   WBC 10.6 (H) 4.0 - 10.5 K/uL   RBC 3.43 (L) 3.87 - 5.11 MIL/uL   Hemoglobin 10.9 (L) 12.0 - 15.0 g/dL   HCT 32.3 (L) 36.0 - 46.0 %   MCV 94.2 78.0 - 100.0 fL   MCH 31.8 26.0 - 34.0 pg   MCHC 33.7 30.0 - 36.0 g/dL   RDW 12.6 11.5 - 15.5 %   Platelets 121 (L) 150 - 400 K/uL  Glucose, capillary     Status: Abnormal   Collection Time: 02/01/14  7:40 AM  Result Value Ref Range   Glucose-Capillary 115 (H) 70 - 99 mg/dL    Dg Chest 1 View  01/31/2014   CLINICAL DATA:  Status post fall. Concern for chest injury. Initial encounter.  EXAM: CHEST - 1 VIEW  COMPARISON:  Chest radiograph performed 03/12/2011  FINDINGS: The lungs are well-aerated hyperexpanded, with flattening of the hemidiaphragms, likely reflecting COPD. Minimal left basilar atelectasis or scarring is again noted. There is no evidence of pleural effusion or pneumothorax.  The cardiomediastinal silhouette is borderline normal in size. No acute osseous abnormalities are seen.  IMPRESSION: Findings suggest COPD. Minimal left basilar atelectasis or scarring noted. No displaced rib fracture seen.   Electronically Signed   By: Garald Balding M.D.   On: 01/31/2014 23:38   Dg Hip Unilat With Pelvis 2-3 Views Left  01/31/2014   CLINICAL DATA:  Lost balance and fell today at home while taking of shoes. LEFT hip pain.  EXAM: DG HIP W/ PELVIS 2-3V*L*  COMPARISON:  None available for comparison at time of study interpretation.  FINDINGS: LEFT femoral neck fracture with impaction, varus angulation distal bony fragments. No dislocation. No destructive  bony lesions. Soft tissue planes are nonsuspicious.  IMPRESSION: Impacted displaced LEFT femoral neck fracture without dislocation.   Electronically Signed   By: Elon Alas   On: 01/31/2014 23:41    ROS  Positive for left hip pain  Blood pressure 119/49, pulse 88, temperature 99.9 F (37.7 C), temperature source Oral, resp. rate 20, height 5' 4"  (1.626 m), weight 61.236 kg (135 lb), SpO2 97 %. Physical Exam  81 year old frail appearing white female seen laying supine in hospital bed (Family member at the beside) She is alert and appears appropriate on exam although vague with a few details surrounding the fall.  Left lower extremity - Pain noted on hip roll Motor function is intact - moving foot and toes on exam Left leg is shortened compared to right leg Sensation intact to left leg.  Assessment/Plan: Displaced Left Femoral Neck Fracture  Radiographs and findings discussed with the patient and family member.  It is felt that the patient would benefit from undergoing a left hip hemiarthroplasty to  surgically fix the let hip fracture.  Risks and benefits of the surgery have been discussed with the patient and they elect to proceed with surgery.  There are on active contraindications to upcoming procedure such as ongoing infection or progressive neurological disease.  Dr. Wynelle Link will plan on surgery later this afternoon around 5 PM or after.  Will allow the patient to have liquid breakfast and then NPO after 10 AM today. Will hold Heparin at this time in anticipation for surgery. Consent ordered. Ancef on call to OR Patient has foley. Hold lisinopril and aspirin. May take amlodipine with sip of water along with Sytnroid med this morning.  PERKINS, ALEXZANDREW 02/01/2014, 7:49 AM

## 2014-02-01 NOTE — Anesthesia Preprocedure Evaluation (Addendum)
Anesthesia Evaluation  Patient identified by MRN, date of birth, ID band Patient awake and Patient confused    Reviewed: Allergy & Precautions, NPO status , Patient's Chart, lab work & pertinent test results  Airway Mallampati: II  TM Distance: >3 FB Neck ROM: Full    Dental no notable dental hx.    Pulmonary neg pulmonary ROS,  breath sounds clear to auscultation  Pulmonary exam normal       Cardiovascular hypertension, Pt. on medications +CHF Rhythm:Regular Rate:Normal     Neuro/Psych negative neurological ROS  negative psych ROS   GI/Hepatic negative GI ROS, Neg liver ROS,   Endo/Other  negative endocrine ROS  Renal/GU Renal InsufficiencyRenal disease  negative genitourinary   Musculoskeletal negative musculoskeletal ROS (+)   Abdominal   Peds negative pediatric ROS (+)  Hematology negative hematology ROS (+)   Anesthesia Other Findings   Reproductive/Obstetrics negative OB ROS                            Anesthesia Physical Anesthesia Plan  ASA: II  Anesthesia Plan: Spinal   Post-op Pain Management:    Induction:   Airway Management Planned: Simple Face Mask  Additional Equipment:   Intra-op Plan:   Post-operative Plan:   Informed Consent: I have reviewed the patients History and Physical, chart, labs and discussed the procedure including the risks, benefits and alternatives for the proposed anesthesia with the patient or authorized representative who has indicated his/her understanding and acceptance.   Dental advisory given  Plan Discussed with: CRNA  Anesthesia Plan Comments:         Anesthesia Quick Evaluation

## 2014-02-01 NOTE — Transfer of Care (Signed)
Immediate Anesthesia Transfer of Care Note  Patient: [name]  Procedure(s) Performed: Procedure(s): ARTHROPLASTY BIPOLAR HIP (Left)  Patient Location: PACU  Anesthesia Type:Spinal  Level of Consciousness: awake  Airway & Oxygen Therapy: Patient Spontanous Breathing and Patient connected to face mask oxygen  Post-op Assessment: Report given to PACU RN and Post -op Vital signs reviewed and stable  Post vital signs: Reviewed and stable  Complications: No apparent anesthesia complications

## 2014-02-01 NOTE — Progress Notes (Signed)
PT Cancellation Note  Patient Details Name: MARYCLARE NYDAM MRN: [id number] DOB: 1920-11-08   Cancelled Treatment:     PT eval order received but deferred pending orthopedic intervention.  Possible surgery this date.   Please reorder PT services post op.     Chrystian Cupples 02/01/2014, 8:08 AM

## 2014-02-01 NOTE — Consult Note (Signed)
Reason for Consult: Left hip injury following fall at home. Referring Physician: Ivor Costa, MD  [name] is an 81 y.o. female.  HPI: Patient is a 81 year old female that sustained a left hip injury last night while at home in her bedroom.  Her family that lives with her found her on the floor.  She was in her bedroom and turned around at her closet and lost her balance.  She had immediate onset of left hip pain.  Brought to [facility] wher she was found to have a displaced left femoral neck fracture.  Patient was admitted by medicine and ortho consulted.  Patient seen today and appears comfortable.  Radiographs reviewed and situation discussed with the patient and family.  Past Medical History  Diagnosis Date  . Hypertension 10/1998  . Hyperlipidemia 12/2002  . Diastolic CHF   . Vaginal delivery     x4, at home  . Anemia 12/30-01/25/2006    [facility]- CHF- started lasix  . History of colon cancer     s/p partial colectomy  . History of ETT 03/15/2006    adenosine myoview low risk  . Hypothyroidism   . Microhematuria 03/2013    watchful waiting (MacDiarmid)  . CKD (chronic kidney disease), stage III 03/04/2010    Past Surgical History  Procedure Laterality Date  . Cholecystectomy  1984  . Partial colectomy  03/25/2006    right  . Esophagogastroduodenoscopy      B9 polyp H.Pylori bx- "dilated" polyps  . History of abd  02/25/2006    bilat renal cysts, no mets  . Ct head limited w/o cm  02/2011    no acute process.  Mild to moderate cortical volume loss and scattered small vessel ischemic microangiopathy  . Colonoscopy  03/2010    1 tubular adenoma, int hemorrhoids, f/u prn [name]    Family History  Problem Relation Age of Onset  . Stroke Sister   . Diabetes Sister   . Hypertension Sister   . Stroke Mother   . Alcohol abuse Brother     Social History:  reports that she has never smoked. She has never used smokeless tobacco. She reports that she does not drink alcohol or use illicit  drugs.  Lives at home with family.  Allergies: No Known Allergies  Medications:  Amlodipine 5 mg Aspirin 81 mg Ocuvite Vitamin D3 Ferrous Sulfate 325 mg Lasix 20 mg Synthroid 25 mcg Lisinopril 5 mg Vitamin B12   Results for orders placed or performed during the hospital encounter of 01/31/14 (from the past 48 hour(s))  Basic metabolic panel     Status: Abnormal   Collection Time: 01/31/14 10:46 PM  Result Value Ref Range   Sodium 137 135 - 145 mmol/L    Comment: Please note change in reference range.   Potassium 4.5 3.5 - 5.1 mmol/L    Comment: Please note change in reference range.   Chloride 104 96 - 112 mEq/L   CO2 25 19 - 32 mmol/L   Glucose, Bld 142 (H) 70 - 99 mg/dL   BUN 32 (H) 6 - 23 mg/dL   Creatinine, Ser 1.38 (H) 0.50 - 1.10 mg/dL   Calcium 9.2 8.4 - 10.5 mg/dL   GFR calc non Af Amer 32 (L) >90 mL/min   GFR calc Af Amer 37 (L) >90 mL/min    Comment: (NOTE) The eGFR has been calculated using the CKD EPI equation. This calculation has not been validated in all clinical situations. eGFR's persistently <  90 mL/min signify possible Chronic Kidney Disease.    Anion gap 8 5 - 15  CBC WITH DIFFERENTIAL     Status: Abnormal   Collection Time: 01/31/14 10:46 PM  Result Value Ref Range   WBC 10.0 4.0 - 10.5 K/uL   RBC 3.72 (L) 3.87 - 5.11 MIL/uL   Hemoglobin 11.8 (L) 12.0 - 15.0 g/dL   HCT 35.3 (L) 36.0 - 46.0 %   MCV 94.9 78.0 - 100.0 fL   MCH 31.7 26.0 - 34.0 pg   MCHC 33.4 30.0 - 36.0 g/dL   RDW 12.7 11.5 - 15.5 %   Platelets 130 (L) 150 - 400 K/uL   Neutrophils Relative % 72 43 - 77 %   Neutro Abs 7.2 1.7 - 7.7 K/uL   Lymphocytes Relative 19 12 - 46 %   Lymphs Abs 1.9 0.7 - 4.0 K/uL   Monocytes Relative 7 3 - 12 %   Monocytes Absolute 0.7 0.1 - 1.0 K/uL   Eosinophils Relative 2 0 - 5 %   Eosinophils Absolute 0.2 0.0 - 0.7 K/uL   Basophils Relative 0 0 - 1 %   Basophils Absolute 0.0 0.0 - 0.1 K/uL  Urinalysis, Routine w reflex microscopic     Status:  Abnormal   Collection Time: 01/31/14 11:24 PM  Result Value Ref Range   Color, Urine YELLOW YELLOW   APPearance CLEAR CLEAR   Specific Gravity, Urine 1.017 1.005 - 1.030   pH 5.5 5.0 - 8.0   Glucose, UA NEGATIVE NEGATIVE mg/dL   Hgb urine dipstick NEGATIVE NEGATIVE   Bilirubin Urine NEGATIVE NEGATIVE   Ketones, ur NEGATIVE NEGATIVE mg/dL   Protein, ur NEGATIVE NEGATIVE mg/dL   Urobilinogen, UA 1.0 0.0 - 1.0 mg/dL   Nitrite NEGATIVE NEGATIVE   Leukocytes, UA SMALL (A) NEGATIVE  Urine microscopic-add on     Status: Abnormal   Collection Time: 01/31/14 11:24 PM  Result Value Ref Range   Squamous Epithelial / LPF RARE RARE   WBC, UA 7-10 <3 WBC/hpf   Bacteria, UA MANY (A) RARE  ABO/Rh     Status: None   Collection Time: 02/01/14  2:30 AM  Result Value Ref Range   ABO/RH(D) A POS   Protime-INR     Status: None   Collection Time: 02/01/14  5:03 AM  Result Value Ref Range   Prothrombin Time 13.1 11.6 - 15.2 seconds   INR 0.98 0.00 - 1.49  APTT     Status: None   Collection Time: 02/01/14  5:03 AM  Result Value Ref Range   aPTT 25 24 - 37 seconds  Type and screen     Status: None   Collection Time: 02/01/14  5:03 AM  Result Value Ref Range   ABO/RH(D) A POS    Antibody Screen NEG    Sample Expiration 02/04/2014   Brain natriuretic peptide     Status: None   Collection Time: 02/01/14  5:03 AM  Result Value Ref Range   B Natriuretic Peptide 80.2 0.0 - 100.0 pg/mL    Comment: Please note change in reference range.  Basic metabolic panel     Status: Abnormal   Collection Time: 02/01/14  5:03 AM  Result Value Ref Range   Sodium 140 135 - 145 mmol/L    Comment: Please note change in reference range.   Potassium 4.2 3.5 - 5.1 mmol/L    Comment: Please note change in reference range.   Chloride 107 96 - 112 mEq/L  CO2 25 19 - 32 mmol/L   Glucose, Bld 124 (H) 70 - 99 mg/dL   BUN 29 (H) 6 - 23 mg/dL   Creatinine, Ser 1.20 (H) 0.50 - 1.10 mg/dL   Calcium 9.0 8.4 - 10.5 mg/dL    GFR calc non Af Amer 38 (L) >90 mL/min   GFR calc Af Amer 44 (L) >90 mL/min    Comment: (NOTE) The eGFR has been calculated using the CKD EPI equation. This calculation has not been validated in all clinical situations. eGFR's persistently <90 mL/min signify possible Chronic Kidney Disease.    Anion gap 8 5 - 15  CBC     Status: Abnormal   Collection Time: 02/01/14  5:03 AM  Result Value Ref Range   WBC 10.6 (H) 4.0 - 10.5 K/uL   RBC 3.43 (L) 3.87 - 5.11 MIL/uL   Hemoglobin 10.9 (L) 12.0 - 15.0 g/dL   HCT 32.3 (L) 36.0 - 46.0 %   MCV 94.2 78.0 - 100.0 fL   MCH 31.8 26.0 - 34.0 pg   MCHC 33.7 30.0 - 36.0 g/dL   RDW 12.6 11.5 - 15.5 %   Platelets 121 (L) 150 - 400 K/uL  Glucose, capillary     Status: Abnormal   Collection Time: 02/01/14  7:40 AM  Result Value Ref Range   Glucose-Capillary 115 (H) 70 - 99 mg/dL    Dg Chest 1 View  01/31/2014   CLINICAL DATA:  Status post fall. Concern for chest injury. Initial encounter.  EXAM: CHEST - 1 VIEW  COMPARISON:  Chest radiograph performed 03/12/2011  FINDINGS: The lungs are well-aerated hyperexpanded, with flattening of the hemidiaphragms, likely reflecting COPD. Minimal left basilar atelectasis or scarring is again noted. There is no evidence of pleural effusion or pneumothorax.  The cardiomediastinal silhouette is borderline normal in size. No acute osseous abnormalities are seen.  IMPRESSION: Findings suggest COPD. Minimal left basilar atelectasis or scarring noted. No displaced rib fracture seen.   Electronically Signed   By: Garald Balding M.D.   On: 01/31/2014 23:38   Dg Hip Unilat With Pelvis 2-3 Views Left  01/31/2014   CLINICAL DATA:  Lost balance and fell today at home while taking of shoes. LEFT hip pain.  EXAM: DG HIP W/ PELVIS 2-3V*L*  COMPARISON:  None available for comparison at time of study interpretation.  FINDINGS: LEFT femoral neck fracture with impaction, varus angulation distal bony fragments. No dislocation. No destructive  bony lesions. Soft tissue planes are nonsuspicious.  IMPRESSION: Impacted displaced LEFT femoral neck fracture without dislocation.   Electronically Signed   By: Elon Alas   On: 01/31/2014 23:41    ROS  Positive for left hip pain  Blood pressure 119/49, pulse 88, temperature 99.9 F (37.7 C), temperature source Oral, resp. rate 20, height 5' 4"  (1.626 m), weight 61.236 kg (135 lb), SpO2 97 %. Physical Exam  81 year old frail appearing white female seen laying supine in hospital bed (Family member at the beside) She is alert and appears appropriate on exam although vague with a few details surrounding the fall.  Left lower extremity - Pain noted on hip roll Motor function is intact - moving foot and toes on exam Left leg is shortened compared to right leg Sensation intact to left leg.  Assessment/Plan: Displaced Left Femoral Neck Fracture  Radiographs and findings discussed with the patient and family member.  It is felt that the patient would benefit from undergoing a left hip hemiarthroplasty to  surgically fix the let hip fracture.  Risks and benefits of the surgery have been discussed with the patient and they elect to proceed with surgery.  There are on active contraindications to upcoming procedure such as ongoing infection or progressive neurological disease.  Dr. Wynelle Link will plan on surgery later this afternoon around 5 PM or after.  Will allow the patient to have liquid breakfast and then NPO after 10 AM today. Will hold Heparin at this time in anticipation for surgery. Consent ordered. Ancef on call to OR Patient has foley. Hold lisinopril and aspirin. May take amlodipine with sip of water along with Sytnroid med this morning.  Sherif Millspaugh 02/01/2014, 7:49 AM

## 2014-02-01 NOTE — Op Note (Signed)
OPERATIVE REPORT   PREOPERATIVE DIAGNOSIS:  Left femoral neck fracture displaced.   POSTOPERATIVE DIAGNOSIS:  Left femoral neck fracture displaced.   PROCEDURE:  Left hip hemiarthroplasty.   SURGEON: Gaynelle Arabian, M.D.   ASSISTANT: Arlee Muslim, PA-C  ANESTHESIA: Spinal  ESTIMATED BLOOD LOSS:100 ml    DRAINS: Hemovac x1.   COMPLICATIONS: None.   CONDITION: Stable to recovery.   BRIEF CLINICAL NOTE: [name] is an 81 y.o. female  who had a fall at home yesterday leading to a displaced   Left femoral neck fracture. She was evaluated in the emergency  department and admitted to the medical service. She was cleared for  surgery and presents for left hip hemiarthroplasty.   PROCEDURE IN DETAIL: After successful administration of spinal  anesthetic, the patient was placed in the Right lateral decubitus  position with the Left side up and held with the hip positioner.Left lower extremity was isolated from her perineum with plastic drapes and  prepped and draped in the usual sterile fashion. Short posterolateral  incision was made with a 10 blade through the subcutaneous tissue to the  level of fascia lata which was incised in line with the skin incision.  The sciatic nerve was palpated and protected, and short rotators and  capsule were isolated off the femur. There was complete fracture of the  femoral neck. The femoral head was removed. Diameters 47 mm. We then  isolated the femur with retractors and freshened up the femoral neck cut  with an oscillating saw. I used a starter reamer to get into the  femoral canal and then irrigated with saline. A lateralizing reamer was  then placed. We then broached up to a size 4, which had excellent  rotational and torsional stability. Trial neck for the size 4 with a  28 + 1.5 head and 47 bipolar was placed. Hip was reduced with excellent  stability. I placed the right leg on top of the left leg, and lengths  were equal. Hip was  dislocated. All trials were removed. We trialed  the cement restrictor, and size 3 was most appropriate. The size 3  cement restrictor was placed at the appropriate depth in the femoral  canal. Canal was then prepared with pulsatile lavage and thoroughly  dried. Cement was mixed and once ready implantation was injected into  the femoral canal and pressurized. The size 4 [place] basic  cemented stem was then cemented into place in about 20 degrees of  anteversion. When the cement was fully hardened, then the permanent 28 + 1.5  head and 47 bipolar components were placed. Hip was reduced with  same stability parameters. Capsule and short rotators reattached to the  femur through drill holes with Ethibond suture. Fascia lata was closed  over Hemovac drain with running #1 V-Loc suture. Subcu closed with 2-0  Vicryl, and subcuticular running 4-0 Monocryl. The drains were hooked  to suction. Incision was cleaned and dried and Steri-Strips and a bulky  sterile dressing applied. She was then placed into a knee immobilizer,  awakened and transported to recovery in stable condition.   Gaynelle Arabian, M.D.

## 2014-02-01 NOTE — ED Notes (Signed)
RN Dub Mikes Transporting patient upstairs.

## 2014-02-01 NOTE — Progress Notes (Addendum)
Clinical Social Work Department BRIEF PSYCHOSOCIAL ASSESSMENT 02/01/2014  Patient:  [name], [name]     Account Number:  [id number]     Admit date:  01/31/2014  Clinical Social Worker:  Lacie Scotts  Date/Time:  02/01/2014 02:22 PM  Referred by:  Physician  Date Referred:  02/01/2014 Referred for  SNF Placement   Other Referral:   Interview type:  Family Other interview type:    PSYCHOSOCIAL DATA Living Status:  FAMILY Admitted from facility:   Level of care:   Primary support name:  Bonnita Nasuti May Primary support relationship to patient:  CHILD, ADULT Degree of support available:   supportive    CURRENT CONCERNS Current Concerns  Post-Acute Placement   Other Concerns:    SOCIAL WORK ASSESSMENT / PLAN Pt is a 81 yr old female living at home with family prior to hospitalization. CSW met with pt / daughter to assist with d/c planning. Pt fell at home and will have hip surgery today. Pt may benefit from [place] following hospital d/c. Daughter will discuss this with her sister. Pt / family would also like to review MD / PT recommendations following surgery. CSW will continue to follow to assist with d/c planning needs.   Assessment/plan status:  Psychosocial Support/Ongoing Assessment of Needs Other assessment/ plan:   Information/referral to community resources:   Insurance coverage for SNF and ambulance transport to be reviewed if SNF is needed.    PATIENT'S/FAMILY'S RESPONSE TO PLAN OF CARE: Pt is uncomfortable, discouraged due to fx. Support provided. Family feels rehab may be needed. D/C  planning is ongoing. CSW will review SNF options following surgery / PT recommendations, if appropriate.    Werner Lean LCSW [phone]

## 2014-02-02 LAB — BASIC METABOLIC PANEL
Anion gap: 7 (ref 5–15)
BUN: 21 mg/dL (ref 6–23)
CALCIUM: 8.4 mg/dL (ref 8.4–10.5)
CHLORIDE: 109 meq/L (ref 96–112)
CO2: 26 mmol/L (ref 19–32)
Creatinine, Ser: 1.17 mg/dL — ABNORMAL HIGH (ref 0.50–1.10)
GFR calc Af Amer: 46 mL/min — ABNORMAL LOW (ref >90)
GFR calc non Af Amer: 39 mL/min — ABNORMAL LOW (ref >90)
GLUCOSE: 138 mg/dL — AB (ref 70–99)
Potassium: 4.4 mmol/L (ref 3.5–5.1)
Sodium: 142 mmol/L (ref 135–145)

## 2014-02-02 LAB — CBC
HEMATOCRIT: 32.2 % — AB (ref 36.0–46.0)
HEMOGLOBIN: 10.4 g/dL — AB (ref 12.0–15.0)
MCH: 31.2 pg (ref 26.0–34.0)
MCHC: 32.3 g/dL (ref 30.0–36.0)
MCV: 96.7 fL (ref 78.0–100.0)
PLATELETS: 112 10*3/uL — AB (ref 150–400)
RBC: 3.33 MIL/uL — ABNORMAL LOW (ref 3.87–5.11)
RDW: 12.9 % (ref 11.5–15.5)
WBC: 6.2 10*3/uL (ref 4.0–10.5)

## 2014-02-02 NOTE — Evaluation (Signed)
Physical Therapy Evaluation Patient Details Name: [name] MRN: [id number] DOB: 1920/08/08 Today's Date: 02/02/2014   History of Present Illness  L hip hemi -posterior precautions  Clinical Impression  *Pt admitted with above diagnosis. Pt currently with functional limitations due to the deficits listed below (see PT Problem List). ** Pt will benefit from skilled PT to increase their independence and safety with mobility to allow discharge to the venue listed below.    Pt ambulated 27' with min assist. Reviewed precautions in detail with granddaughter and pt (who has short term memory deficits). Family to discuss DC plan of ST-SNF vs. Home with HHPT. Per granddaughter family may not be able to provide 64* assist.  **    Follow Up Recommendations SNF (SNF vs HHPT with 24* assist at home)    Equipment Recommendations  Rolling walker with 5" wheels    Recommendations for Other Services       Precautions / Restrictions Precautions Precautions: Posterior Hip Precaution Booklet Issued: Yes (comment) Precaution Comments: sign hung in room, reviewed precautions with pt and her grandaughter Restrictions Weight Bearing Restrictions: Yes LLE Weight Bearing: Weight bearing as tolerated      Mobility  Bed Mobility Overal bed mobility: Needs Assistance;+2 for physical assistance Bed Mobility: Supine to Sit     Supine to sit: +2 for physical assistance;Max assist     General bed mobility comments: assist to advance BLEs and to raise trunk  Transfers Overall transfer level: Needs assistance Equipment used: Rolling walker (2 wheeled) Transfers: Sit to/from [facility] Sit to Stand: Mod assist;+2 safety/equipment Stand pivot transfers: Mod assist;+2 safety/equipment       General transfer comment: assist to rise, cues hand placement  Ambulation/Gait Ambulation/Gait assistance: Min assist;+2 safety/equipment Ambulation Distance (Feet): 18 Feet Assistive  device: Rolling walker (2 wheeled) Gait Pattern/deviations: Step-to pattern;Antalgic   Gait velocity interpretation: Below normal speed for age/gender General Gait Details: cues for sequencing and posture, distance limited by pain/fatigue  Stairs            Wheelchair Mobility    Modified Rankin (Stroke Patients Only)       Balance Overall balance assessment: Needs assistance   Sitting balance-Leahy Scale: Good       Standing balance-Leahy Scale: Poor                               Pertinent Vitals/Pain Pain Assessment: Faces Faces Pain Scale: Hurts little more Pain Location: L hip Pain Descriptors / Indicators: Sore Pain Intervention(s): Limited activity within patient's tolerance;Monitored during session;Premedicated before session;Ice applied    Home Living Family/patient expects to be discharged to:: Private residence Living Arrangements: Children Available Help at Discharge: Family;Available 24 hours/day Type of Home: House Home Access: Stairs to enter Entrance Stairs-Rails: None Entrance Stairs-Number of Steps: 5 Home Layout: One level Home Equipment: Bedside commode;Tub bench      Prior Function           Comments: min guard for stairs     Hand Dominance        Extremity/Trunk Assessment   Upper Extremity Assessment: Overall WFL for tasks assessed           Lower Extremity Assessment: LLE deficits/detail   LLE Deficits / Details: L hip AAROM decr 50% by pain, ankle WNL, knee3/5  Cervical / Trunk Assessment: Kyphotic  Communication      Cognition Arousal/Alertness: Awake/alert Behavior During Therapy: [facility] for  tasks assessed/performed Overall Cognitive Status: History of cognitive impairments - at baseline (able to follow directions, short term memory deficits at baseline per grandaughter)       Memory: Decreased short-term memory              General Comments      Exercises Total Joint Exercises Ankle  Circles/Pumps: AROM;Both;10 reps;Supine Heel Slides: AAROM;Left;10 reps;Supine Hip ABduction/ADduction: AAROM;Left;10 reps;Supine      Assessment/Plan    PT Assessment Patient needs continued PT services  PT Diagnosis Difficulty walking;Acute pain   PT Problem List Decreased strength;Decreased range of motion;Decreased activity tolerance;Decreased balance;Pain;Decreased knowledge of use of DME;Decreased knowledge of precautions;Decreased mobility  PT Treatment Interventions Gait training;DME instruction;Stair training;Functional mobility training;Therapeutic activities;Patient/family education;Therapeutic exercise   PT Goals (Current goals can be found in the Care Plan section) Acute Rehab PT Goals Patient Stated Goal: return home PT Goal Formulation: With patient/family Time For Goal Achievement: 02/16/14 Potential to Achieve Goals: Good    Frequency Min 6X/week   Barriers to discharge        Co-evaluation               End of Session Equipment Utilized During Treatment: Gait belt Activity Tolerance: Patient tolerated treatment well Patient left: with call bell/phone within reach;in chair;with family/visitor present Nurse Communication: Mobility status         Time: 1062-6948 PT Time Calculation (min) (ACUTE ONLY): 23 min   Charges:   PT Evaluation $Initial PT Evaluation Tier I: 1 Procedure PT Treatments $Gait Training: 8-22 mins   PT G Codes:        Philomena Doheny 02/02/2014, 9:31 AM [phone]

## 2014-02-02 NOTE — Progress Notes (Signed)
[facility] PROGRESS NOTE  [name] RCV:[id number] DOB: [birth year] DOA: 01/31/2014 PCP: Ria Bush, MD  Assessment/Plan: 1. L femoral neck fracture 1. Via likely mechanical fall 2. Pt is s/p surgery 02/01/14. 3. Pain is well controlled 2. HTN 1. BP stable and controlled 3. Chronic diastolic CHF 1. Appears euvolemic 2. Cont to monitor 4. CKD3 1. Cr stable 2. Avoid nephrotoxic agents 3. Cont to follow 5. Pernicious anemia 1. hgb stable at around 10-11 2. Cont to monitor 6. Hypothyroid 1. Continue thyroid replacement 7. DVT prophylaxis 1. Heparin subq  Code Status: Partial - no intubation but OK for CPR, cardioversion Family Communication: Pt in room, family at bedside Disposition Plan: Pending possible SNF   Consultants:  Orthopedic surgery  Procedures:    Antibiotics:    HPI/Subjective: No acute events noted overnight. Pain well controlled.  Objective: Filed Vitals:   02/02/14 0900 02/02/14 1002 02/02/14 1502 02/02/14 1600  BP: 120/55 112/45 134/51   Pulse: 89 91 96   Temp:  97.5 F (36.4 C) 97.5 F (36.4 C)   TempSrc:  Oral Oral   Resp:  16 16 20   Height:      Weight:      SpO2:  97% 98% 95%    Intake/Output Summary (Last 24 hours) at 02/02/14 1818 Last data filed at 02/02/14 1608  Gross per 24 hour  Intake   2650 ml  Output   1085 ml  Net   1565 ml   Filed Weights   02/01/14 0034  Weight: 61.236 kg (135 lb)    Exam:   General:  Awake, in nad  Cardiovascular: regular, s1, s2  Respiratory: normal resp effort, no wheezing  Abdomen: soft, nondistended  Musculoskeletal: perfused, no clubbing   Data Reviewed: Basic Metabolic Panel:  Recent Labs Lab 01/31/14 2246 02/01/14 0503 02/02/14 0506  NA 137 140 142  K 4.5 4.2 4.4  CL 104 107 109  CO2 25 25 26   GLUCOSE 142* 124* 138*  BUN 32* 29* 21  CREATININE 1.38* 1.20* 1.17*  CALCIUM 9.2 9.0 8.4   Liver Function Tests: No results for input(s): AST, ALT,  ALKPHOS, BILITOT, PROT, ALBUMIN in the last 168 hours. No results for input(s): LIPASE, AMYLASE in the last 168 hours. No results for input(s): AMMONIA in the last 168 hours. CBC:  Recent Labs Lab 01/31/14 2246 02/01/14 0503 02/02/14 0506  WBC 10.0 10.6* 6.2  NEUTROABS 7.2  --   --   HGB 11.8* 10.9* 10.4*  HCT 35.3* 32.3* 32.2*  MCV 94.9 94.2 96.7  PLT 130* 121* 112*   Cardiac Enzymes: No results for input(s): CKTOTAL, CKMB, CKMBINDEX, TROPONINI in the last 168 hours. BNP (last 3 results) No results for input(s): PROBNP in the last 8760 hours. CBG:  Recent Labs Lab 02/01/14 0740  GLUCAP 115*    Recent Results (from the past 240 hour(s))  Urine culture     Status: None (Preliminary result)   Collection Time: 01/31/14 11:24 PM  Result Value Ref Range Status   Specimen Description URINE, CATHETERIZED  Final   Special Requests NONE  Final   Colony Count   Final    >=100,000 COLONIES/ML Performed at [facility]    Culture   Final    [place] Performed at [facility]    Report Status PENDING  Incomplete  Surgical pcr screen     Status: None   Collection Time: 02/01/14 10:49 AM  Result Value Ref Range Status  MRSA, PCR NEGATIVE NEGATIVE Final   Staphylococcus aureus NEGATIVE NEGATIVE Final    Comment:        The Xpert SA Assay (FDA approved for NASAL specimens in patients over 77 years of age), is one component of a comprehensive surveillance program.  Test performance has been validated by [employer] for patients greater than or equal to 34 year old. It is not intended to diagnose infection nor to guide or monitor treatment.      Studies: Dg Chest 1 View  01/31/2014   CLINICAL DATA:  Status post fall. Concern for chest injury. Initial encounter.  EXAM: CHEST - 1 VIEW  COMPARISON:  Chest radiograph performed 03/12/2011  FINDINGS: The lungs are well-aerated hyperexpanded, with flattening of the hemidiaphragms, likely reflecting  COPD. Minimal left basilar atelectasis or scarring is again noted. There is no evidence of pleural effusion or pneumothorax.  The cardiomediastinal silhouette is borderline normal in size. No acute osseous abnormalities are seen.  IMPRESSION: Findings suggest COPD. Minimal left basilar atelectasis or scarring noted. No displaced rib fracture seen.   Electronically Signed   By: Garald Balding M.D.   On: 01/31/2014 23:38   Pelvis Portable  02/01/2014   CLINICAL DATA:  Postop left total hip replacement  EXAM: PORTABLE PELVIS 1-2 VIEWS  COMPARISON:  None.  FINDINGS: Unipolar left hip replacement in anticipated position. Postsurgical change over the joint as anticipated including surgical drain.  IMPRESSION: Anticipated postoperative appear   Electronically Signed   By: Skipper Cliche M.D.   On: 02/01/2014 21:06   Dg Hip Unilat With Pelvis 2-3 Views Left  01/31/2014   CLINICAL DATA:  Lost balance and fell today at home while taking of shoes. LEFT hip pain.  EXAM: DG HIP W/ PELVIS 2-3V*L*  COMPARISON:  None available for comparison at time of study interpretation.  FINDINGS: LEFT femoral neck fracture with impaction, varus angulation distal bony fragments. No dislocation. No destructive bony lesions. Soft tissue planes are nonsuspicious.  IMPRESSION: Impacted displaced LEFT femoral neck fracture without dislocation.   Electronically Signed   By: Elon Alas   On: 01/31/2014 23:41    Scheduled Meds: . amLODipine  5 mg Oral QPM  . beta carotene w/minerals  1 tablet Oral Daily  . cholecalciferol  1,000 Units Oral BID  . docusate sodium  100 mg Oral BID  . enoxaparin (LOVENOX) injection  40 mg Subcutaneous Q24H  . ferrous sulfate  325 mg Oral BID  . furosemide  10 mg Oral Daily  . levothyroxine  25 mcg Oral QAC breakfast  . vitamin B-12  1,000 mcg Oral Daily   Continuous Infusions: . dextrose 5 % and 0.9% NaCl 75 mL/hr at 02/01/14 1934  . phenylephrine (NEO-SYNEPHRINE) 10 mg/250 ml infusion (40  mcg/ml)      Principal Problem:   Left displaced femoral neck fracture Active Problems:   History of pernicious anemia   Essential hypertension   Chronic diastolic CHF (congestive heart failure)   CKD (chronic kidney disease), stage III   Personal history of malignant neoplasm of gastrointestinal tract   Hypothyroidism   Closed displaced fracture of left femoral neck  Time spent: 35min  Mady Oubre, [facility] Pager [phone]. If 7PM-7AM, please contact night-coverage at [email or web address], password [facility] 02/02/2014, 6:18 PM  LOS: 2 days

## 2014-02-02 NOTE — Progress Notes (Signed)
   Subjective: 1 Day Post-Op Procedure(s) (LRB): ARTHROPLASTY BIPOLAR HIP (Left) Patient reports pain as mild.   Patient seen in rounds with Dr. Gladstone Lighter. Patient is well, and has had no acute complaints or problems. Granddaughter in room. In bed with knee immobilizer on. No issues overnight. No SOB or chest pain.    Objective: Vital signs in last 24 hours: Temp:  [97.2 F (36.2 C)-99 F (37.2 C)] 99 F (37.2 C) (01/09 0627) Pulse Rate:  [59-89] 85 (01/09 0627) Resp:  [16-19] 17 (01/09 0627) BP: (93-134)/(31-79) 109/45 mmHg (01/09 0627) SpO2:  [95 %-100 %] 98 % (01/09 0627)  Intake/Output from previous day:  Intake/Output Summary (Last 24 hours) at 02/02/14 0820 Last data filed at 02/02/14 0641  Gross per 24 hour  Intake   2410 ml  Output   1885 ml  Net    525 ml     Labs:  Recent Labs  01/31/14 2246 02/01/14 0503 02/02/14 0506  HGB 11.8* 10.9* 10.4*    Recent Labs  02/01/14 0503 02/02/14 0506  WBC 10.6* 6.2  RBC 3.43* 3.33*  HCT 32.3* 32.2*  PLT 121* 112*    Recent Labs  02/01/14 0503 02/02/14 0506  NA 140 142  K 4.2 4.4  CL 107 109  CO2 25 26  BUN 29* 21  CREATININE 1.20* 1.17*  GLUCOSE 124* 138*  CALCIUM 9.0 8.4    Recent Labs  02/01/14 0503  INR 0.98    EXAM General - Patient is Alert and Oriented Extremity - Neurologically intact Intact pulses distally Dorsiflexion/Plantar flexion intact Compartment soft Dressing/Incision - clean, dry, no drainage Motor Function - intact, moving foot and toes well on exam.  Hemovac pulled  Past Medical History  Diagnosis Date  . Hypertension 10/1998  . Hyperlipidemia 12/2002  . Diastolic CHF   . Vaginal delivery     x4, at home  . Anemia 12/30-01/25/2006    [facility]- CHF- started lasix  . History of colon cancer     s/p partial colectomy  . History of ETT 03/15/2006    adenosine myoview low risk  . Hypothyroidism   . Microhematuria 03/2013    watchful waiting ([place])  . CKD (chronic  kidney disease), stage III 03/04/2010    Assessment/Plan: 1 Day Post-Op Procedure(s) (LRB): ARTHROPLASTY BIPOLAR HIP (Left) Principal Problem:   Left displaced femoral neck fracture Active Problems:   History of pernicious anemia   Essential hypertension   Chronic diastolic CHF (congestive heart failure)   CKD (chronic kidney disease), stage III   Personal history of malignant neoplasm of gastrointestinal tract   Hypothyroidism   Closed displaced fracture of left femoral neck  Estimated body mass index is 23.16 kg/(m^2) as calculated from the following:   Height as of this encounter: 5\' 4"  (1.626 m).   Weight as of this encounter: 61.236 kg (135 lb). Advance diet Up with therapy  DVT Prophylaxis - Lovenox Weight-Bearing as tolerated    Work with therapy today. Plan discussed with granddaughter.   Ardeen Jourdain., PA-C Orthopaedic Surgery 02/02/2014, 8:20 AM

## 2014-02-02 NOTE — Care Management (Signed)
CARE MANAGEMENT NOTE 02/02/2014  Patient:  CHASADY, LONGWELL   Account Number:  [id number]  Date Initiated:  02/02/2014  Documentation initiated by:  Apolonio Schneiders  Subjective/Objective Assessment:   Left hip hemiarthroplasty     Action/Plan:   Anticipated DC Date:     Anticipated DC Plan:  SKILLED NURSING FACILITY  In-house referral  Clinical Social Worker      DC Forensic scientist  CM consult      [facility] Choice  DURABLE MEDICAL EQUIPMENT   Choice offered to / List presented to:  C-4 Adult Children   DME arranged  Vassie Moselle      DME agency  [place].        Status of service:  In process, will continue to follow Medicare Important Message given?   (If response is "NO", the following Medicare IM given date fields will be blank) Date Medicare IM given:   Medicare IM given by:   Date Additional Medicare IM given:   Additional Medicare IM given by:    Discharge Disposition:    Per UR Regulation:    If discussed at Long Length of Stay Meetings, dates discussed:    Comments:  02/02/14 1600 - CM spoke with patient and her daughter. Needs a rolling walker. Daughter selected [facility]. James at [facility] notified of walker request. Daughter mentioned the plan for possible SNF placement. States patient will need PT daily and may not do what the family asks her to do if home health PT is ordered. Venita Sheffield RN BSN CCM [phone]

## 2014-02-02 NOTE — Anesthesia Postprocedure Evaluation (Signed)
  Anesthesia Post-op Note  Patient: [name]  Procedure(s) Performed: Procedure(s) (LRB): ARTHROPLASTY BIPOLAR HIP (Left)  Patient Location: PACU  Anesthesia Type: Spinal  Level of Consciousness: awake and alert   Airway and Oxygen Therapy: Patient Spontanous Breathing  Post-op Pain: mild  Post-op Assessment: Post-op Vital signs reviewed, Patient's Cardiovascular Status Stable, Respiratory Function Stable, Patent Airway and No signs of Nausea or vomiting  Last Vitals:  Filed Vitals:   02/02/14 0045  BP:   Pulse:   Temp:   Resp: 16    Post-op Vital Signs: stable   Complications: No apparent anesthesia complications

## 2014-02-02 NOTE — Clinical Social Work Note (Signed)
   CSW met with pt and her daughter at bedside  CSW prompted discussion about possible SNF placement  CSW explained protocol for faxing out pt information if she was agreeable and wanting SNF placement   Pt and family agree that pt needs a SNF after hospital stay and are agreeable to faxing out pt's information anywhere except [facility].  CSW faxed out pt information in carefinder system  CSW will follow up with bed offers  .Dede Query, LCSW [facility] Clinical Social Worker - Weekend Coverage cell #: [phone]

## 2014-02-02 NOTE — Evaluation (Signed)
Occupational Therapy Evaluation Patient Details Name: [name] MRN: [id number] DOB: 06-Nov-1920 Today's Date: 02/02/2014    History of Present Illness Pt admitted after falling ans sustaing Lt femoral neck fracture.  She underwent Lt hemiarthroplasty.  PMH includes: HTN; CHF; Colon CA; CKD   Clinical Impression   Pt admitted with above. She presents with generalized weakness, impaired cognition, acute pain, and impaired balance.  She currently, requires min A for functional transfers, and max A for LB ADLs.  Recommend [facility] level rehab at discharge (daughter reports they have decided that is best option), and all further OT needs can be addressed at SNF.  Acute OT will sign off at this time.       Follow Up Recommendations  SNF    Equipment Recommendations  None recommended by OT    Recommendations for Other Services       Precautions / Restrictions Precautions Precautions: Posterior Hip Precaution Booklet Issued: Yes (comment) Precaution Comments: Daughter able to recall THA precautions but pt requires mod verbal cues  Restrictions Weight Bearing Restrictions: Yes LLE Weight Bearing: Weight bearing as tolerated      Mobility Bed Mobility Overal bed mobility: Needs Assistance Bed Mobility: Sit to Supine       Sit to supine: Mod assist   General bed mobility comments: Assist for Lt. LE  Transfers Overall transfer level: Needs assistance Equipment used: Rolling walker (2 wheeled) Transfers: Sit to/from [facility] Sit to Stand: Min assist Stand pivot transfers: Min assist       General transfer comment: assist to power up and assist to steady    Balance                                            ADL Overall ADL's : Needs assistance/impaired Eating/Feeding: Independent;Sitting   Grooming: Wash/dry hands;Wash/dry face;Oral care;Supervision/safety;Sitting   Upper Body Bathing: Minimal assitance;Sitting   Lower Body  Bathing: Maximal assistance;Sit to/from stand   Upper Body Dressing : Supervision/safety;Sitting   Lower Body Dressing: Maximal assistance;Sit to/from stand   Toilet Transfer: Minimal assistance;Stand-pivot;BSC;RW   Toileting- Clothing Manipulation and Hygiene: Moderate assistance;Sit to/from stand       Functional mobility during ADLs: Minimal assistance;Rolling walker General ADL Comments: Pt fatigues quickly.  Requires mod verbal cues to adhere to posterior THA precautions and for walker safety      Vision                     Perception     Praxis      Pertinent Vitals/Pain Pain Assessment: Faces Faces Pain Scale: Hurts little more Pain Location: Lt hip  Pain Descriptors / Indicators: Aching;Sore Pain Intervention(s): Monitored during session;Repositioned     Hand Dominance     Extremity/Trunk Assessment Upper Extremity Assessment Upper Extremity Assessment: Overall WFL for tasks assessed   Lower Extremity Assessment Lower Extremity Assessment: Defer to PT evaluation   Cervical / Trunk Assessment Cervical / Trunk Assessment: Kyphotic   Communication     Cognition Arousal/Alertness: Awake/alert Behavior During Therapy: WFL for tasks assessed/performed Overall Cognitive Status: History of cognitive impairments - at baseline       Memory: Decreased short-term memory             General Comments       Exercises       Shoulder Instructions  Home Living Family/patient expects to be discharged to:: Skilled nursing facility Living Arrangements: Children Available Help at Discharge: Family;Available 24 hours/day Type of Home: House Home Access: Stairs to enter [employer] of Steps: 5 Entrance Stairs-Rails: None Home Layout: One level     Bathroom Shower/Tub: Tub/shower unit;Curtain   Bathroom Toilet: Standard Bathroom Accessibility: Yes   Home Equipment: Bedside commode;Tub bench          Prior  Functioning/Environment Level of Independence: Needs assistance    ADL's / Homemaking Assistance Needed: mod I   Comments: min guard for stairs    OT Diagnosis: Acute pain;Generalized weakness   OT Problem List: Decreased strength;Decreased activity tolerance;Impaired balance (sitting and/or standing);Decreased cognition;Decreased safety awareness;Decreased knowledge of use of DME or AE;Decreased knowledge of precautions;Pain   OT Treatment/Interventions:      OT Goals(Current goals can be found in the care plan section) Acute Rehab OT Goals Patient Stated Goal: rehab then home   OT Frequency:     Barriers to D/C:            Co-evaluation              End of Session Equipment Utilized During Treatment: Rolling walker Nurse Communication: Mobility status  Activity Tolerance: Patient tolerated treatment well Patient left: in bed;with call bell/phone within reach;with bed alarm set;with family/visitor present   Time: 3734-2876 OT Time Calculation (min): 16 min Charges:  OT General Charges $OT Visit: 1 Procedure OT Evaluation $Initial OT Evaluation Tier I: 1 Procedure OT Treatments $Self Care/Home Management : 8-22 mins G-Codes:    Lexii Walsh M 02-14-14, 2:14 PM

## 2014-02-03 ENCOUNTER — Encounter: Payer: Self-pay | Admitting: Family Medicine

## 2014-02-03 DIAGNOSIS — Z8500 Personal history of malignant neoplasm of unspecified digestive organ: Secondary | ICD-10-CM

## 2014-02-03 LAB — BASIC METABOLIC PANEL
Anion gap: 1 — ABNORMAL LOW (ref 5–15)
BUN: 24 mg/dL — AB (ref 6–23)
CO2: 24 mmol/L (ref 19–32)
Calcium: 8.1 mg/dL — ABNORMAL LOW (ref 8.4–10.5)
Chloride: 111 mEq/L (ref 96–112)
Creatinine, Ser: 1.24 mg/dL — ABNORMAL HIGH (ref 0.50–1.10)
GFR calc non Af Amer: 37 mL/min — ABNORMAL LOW (ref >90)
GFR, EST AFRICAN AMERICAN: 43 mL/min — AB (ref >90)
Glucose, Bld: 115 mg/dL — ABNORMAL HIGH (ref 70–99)
Potassium: 3.7 mmol/L (ref 3.5–5.1)
Sodium: 136 mmol/L (ref 135–145)

## 2014-02-03 LAB — CBC
HEMATOCRIT: 29.5 % — AB (ref 36.0–46.0)
Hemoglobin: 9.6 g/dL — ABNORMAL LOW (ref 12.0–15.0)
MCH: 31.3 pg (ref 26.0–34.0)
MCHC: 32.5 g/dL (ref 30.0–36.0)
MCV: 96.1 fL (ref 78.0–100.0)
Platelets: 95 10*3/uL — ABNORMAL LOW (ref 150–400)
RBC: 3.07 MIL/uL — ABNORMAL LOW (ref 3.87–5.11)
RDW: 12.8 % (ref 11.5–15.5)
WBC: 8.5 10*3/uL (ref 4.0–10.5)

## 2014-02-03 MED ORDER — ENOXAPARIN SODIUM 30 MG/0.3ML ~~LOC~~ SOLN
30.0000 mg | SUBCUTANEOUS | Status: DC
  Administered 2014-02-03 – 2014-02-04 (×2): 30 mg via SUBCUTANEOUS
  Filled 2014-02-03 (×4): qty 0.3

## 2014-02-03 NOTE — Progress Notes (Signed)
Physical Therapy Treatment Patient Details Name: [name] MRN: [id number] DOB: 08-May-1920 Today's Date: 02/03/2014    History of Present Illness Pt admitted after falling ans sustaing Lt femoral neck fracture.  She underwent Lt hemiarthroplasty.  PMH includes: HTN; CHF; Colon CA; CKD    PT Comments    POD # 2 pt OOB in recliner with family at bedside.  Assisted pt out of recliner to [facility].  Assisted with hygiene then amb a short distance in hallway.  Performed some L LE TE's AAROM with caution to THP.  Positioned in recliner and applied ICE.    Follow Up Recommendations  SNF     Equipment Recommendations       Recommendations for Other Services       Precautions / Restrictions Precautions Precautions: Posterior Hip Precaution Comments: GrandDaughter able to recall THA precautions but pt requires mod verbal cues  Restrictions Weight Bearing Restrictions: No LLE Weight Bearing: Weight bearing as tolerated    Mobility  Bed Mobility               General bed mobility comments: Pt OOB in recliner  Transfers Overall transfer level: Needs assistance Equipment used: Rolling walker (2 wheeled) Transfers: Sit to/from Stand Sit to Stand: Min assist         General transfer comment: 75% VC's on proper tech, proper hand placement, turn completion and esp THP.  Transfered from recliner to [facility], then off BSC to amb.    Ambulation/Gait Ambulation/Gait assistance: +2 safety/equipment;Min assist;Mod assist Ambulation Distance (Feet): 20 Feet Assistive device: Rolling walker (2 wheeled) Gait Pattern/deviations: Step-to pattern;Decreased stance time - left Gait velocity: decreased   General Gait Details: 25% VC's on proper walker to self distance and 75% VC's on safety with turns and backward gait to recliner.     Stairs            Wheelchair Mobility    Modified Rankin (Stroke Patients Only)       Balance                                     Cognition                            Exercises      General Comments        Pertinent Vitals/Pain Pain Assessment: Faces Faces Pain Scale: Hurts a little bit Pain Location: L hip/thigh Pain Descriptors / Indicators: Grimacing Pain Intervention(s): Monitored during session;Repositioned;Ice applied    Home Living                      Prior Function            PT Goals (current goals can now be found in the care plan section) Progress towards PT goals: Progressing toward goals    Frequency  Min 6X/week    PT Plan      Co-evaluation             End of Session Equipment Utilized During Treatment: Gait belt Activity Tolerance: Patient tolerated treatment well Patient left: in chair;with call bell/phone within reach;with family/visitor present     Time: 1125-1150 PT Time Calculation (min) (ACUTE ONLY): 25 min  Charges:  $Gait Training: 8-22 mins $Therapeutic Activity: 8-22 mins  G Codes:      Rica Koyanagi  PTA [facility]  Acute  Rehab Pager      [phone]

## 2014-02-03 NOTE — Clinical Social Work Note (Signed)
   CSW met with pt and family at bedside to discuss SNF choice  Pt and family choose [facility] as their SNF choice  CSW will send [facility] a note through [facility] that pt would like a private room if available  CSW will continue to monitor until discharge  .Dede Query, LCSW [facility] Clinical Social Worker - Weekend Coverage cell #: [phone]

## 2014-02-03 NOTE — Progress Notes (Signed)
[facility] PROGRESS NOTE  [name] TSV:[id number] DOB: 1920/09/22 DOA: 01/31/2014 PCP: Ria Bush, MD  Assessment/Plan: 1. L femoral neck fracture 1. Via likely mechanical fall 2. Pt is s/p surgery 02/01/14. 3. Pain is well controlled 4. Pending SNF placement 5. Ortho recs for lovenox x 10 days then 81mg  ASA for 4 weeks, weight bearing as tolerated 2. HTN 1. BP stable and controlled 3. Chronic diastolic CHF 1. Appears euvolemic 2. Cont to monitor 4. CKD3 1. Cr stable 2. Avoid nephrotoxic agents 3. Cont to follow 5. Pernicious anemia 1. hgb stable at around 10-11 2. Cont to monitor 6. Hypothyroid 1. Continue thyroid replacement 7. DVT prophylaxis 1. Heparin subq  Code Status: Partial - no intubation but OK for CPR, cardioversion Family Communication: Pt in room, family at bedside Disposition Plan: Pending SNF   Consultants:  Orthopedic surgery  Procedures:    Antibiotics:    HPI/Subjective: Denies pain. No complaints  Objective: Filed Vitals:   02/02/14 1600 02/02/14 2206 02/03/14 0640 02/03/14 1447  BP:  126/46 115/47 122/71  Pulse:  101 88 87  Temp:  99.5 F (37.5 C) 97.5 F (36.4 C) 97.6 F (36.4 C)  TempSrc:  Oral Oral Oral  Resp: 20 20 20 18   Height:      Weight:      SpO2: 95% 97% 97% 100%    Intake/Output Summary (Last 24 hours) at 02/03/14 1649 Last data filed at 02/03/14 1434  Gross per 24 hour  Intake    600 ml  Output    375 ml  Net    225 ml   Filed Weights   02/01/14 0034  Weight: 61.236 kg (135 lb)    Exam:   General:  Awake, in nad  Cardiovascular: regular, s1, s2  Respiratory: normal resp effort, no wheezing  Abdomen: soft, nondistended  Musculoskeletal: perfused, no clubbing   Data Reviewed: Basic Metabolic Panel:  Recent Labs Lab 01/31/14 2246 02/01/14 0503 02/02/14 0506 02/03/14 0510  NA 137 140 142 136  K 4.5 4.2 4.4 3.7  CL 104 107 109 111  CO2 25 25 26 24   GLUCOSE 142* 124*  138* 115*  BUN 32* 29* 21 24*  CREATININE 1.38* 1.20* 1.17* 1.24*  CALCIUM 9.2 9.0 8.4 8.1*   Liver Function Tests: No results for input(s): AST, ALT, ALKPHOS, BILITOT, PROT, ALBUMIN in the last 168 hours. No results for input(s): LIPASE, AMYLASE in the last 168 hours. No results for input(s): AMMONIA in the last 168 hours. CBC:  Recent Labs Lab 01/31/14 2246 02/01/14 0503 02/02/14 0506 02/03/14 0510  WBC 10.0 10.6* 6.2 8.5  NEUTROABS 7.2  --   --   --   HGB 11.8* 10.9* 10.4* 9.6*  HCT 35.3* 32.3* 32.2* 29.5*  MCV 94.9 94.2 96.7 96.1  PLT 130* 121* 112* 95*   Cardiac Enzymes: No results for input(s): CKTOTAL, CKMB, CKMBINDEX, TROPONINI in the last 168 hours. BNP (last 3 results) No results for input(s): PROBNP in the last 8760 hours. CBG:  Recent Labs Lab 02/01/14 0740  GLUCAP 115*    Recent Results (from the past 240 hour(s))  Urine culture     Status: None (Preliminary result)   Collection Time: 01/31/14 11:24 PM  Result Value Ref Range Status   Specimen Description URINE, CATHETERIZED  Final   Special Requests NONE  Final   Colony Count   Final    >=100,000 COLONIES/ML Performed at [facility]   Final  GRAM NEGATIVE RODS Performed at [facility]    Report Status PENDING  Incomplete  Surgical pcr screen     Status: None   Collection Time: 02/01/14 10:49 AM  Result Value Ref Range Status   MRSA, PCR NEGATIVE NEGATIVE Final   Staphylococcus aureus NEGATIVE NEGATIVE Final    Comment:        The Xpert SA Assay (FDA approved for NASAL specimens in patients over 23 years of age), is one component of a comprehensive surveillance program.  Test performance has been validated by [employer] for patients greater than or equal to 30 year old. It is not intended to diagnose infection nor to guide or monitor treatment.      Studies: Pelvis Portable  02/01/2014   CLINICAL DATA:  Postop left total hip replacement  EXAM:  PORTABLE PELVIS 1-2 VIEWS  COMPARISON:  None.  FINDINGS: Unipolar left hip replacement in anticipated position. Postsurgical change over the joint as anticipated including surgical drain.  IMPRESSION: Anticipated postoperative appear   Electronically Signed   By: Skipper Cliche M.D.   On: 02/01/2014 21:06    Scheduled Meds: . amLODipine  5 mg Oral QPM  . beta carotene w/minerals  1 tablet Oral Daily  . cholecalciferol  1,000 Units Oral BID  . docusate sodium  100 mg Oral BID  . enoxaparin (LOVENOX) injection  30 mg Subcutaneous Q24H  . ferrous sulfate  325 mg Oral BID  . furosemide  10 mg Oral Daily  . levothyroxine  25 mcg Oral QAC breakfast  . vitamin B-12  1,000 mcg Oral Daily   Continuous Infusions: . dextrose 5 % and 0.9% NaCl 75 mL/hr at 02/01/14 1934  . phenylephrine (NEO-SYNEPHRINE) 10 mg/250 ml infusion (40 mcg/ml)      Principal Problem:   Left displaced femoral neck fracture Active Problems:   History of pernicious anemia   Essential hypertension   Chronic diastolic CHF (congestive heart failure)   CKD (chronic kidney disease), stage III   Personal history of malignant neoplasm of gastrointestinal tract   Hypothyroidism   Closed displaced fracture of left femoral neck  Time spent: 77min  Ashli Selders, [facility] Pager [phone]. If 7PM-7AM, please contact night-coverage at [email or web address], password [facility] 02/03/2014, 4:49 PM  LOS: 3 days

## 2014-02-03 NOTE — Progress Notes (Signed)
   Subjective: 2 Days Post-Op Procedure(s) (LRB): ARTHROPLASTY BIPOLAR HIP (Left) Patient reports pain as mild.   Plan is to go Skilled nursing facility after hospital stay.  Objective: Vital signs in last 24 hours: Temp:  [97.5 F (36.4 C)-99.5 F (37.5 C)] 97.5 F (36.4 C) (01/10 0640) Pulse Rate:  [88-101] 88 (01/10 0640) Resp:  [16-20] 20 (01/10 0640) BP: (112-134)/(45-55) 115/47 mmHg (01/10 0640) SpO2:  [95 %-98 %] 97 % (01/10 0640)  Intake/Output from previous day:  Intake/Output Summary (Last 24 hours) at 02/03/14 0755 Last data filed at 02/03/14 0640  Gross per 24 hour  Intake    360 ml  Output    575 ml  Net   -215 ml    Intake/Output this shift:    Labs:  Recent Labs  01/31/14 2246 02/01/14 0503 02/02/14 0506 02/03/14 0510  HGB 11.8* 10.9* 10.4* 9.6*    Recent Labs  02/02/14 0506 02/03/14 0510  WBC 6.2 8.5  RBC 3.33* 3.07*  HCT 32.2* 29.5*  PLT 112* 95*    Recent Labs  02/02/14 0506 02/03/14 0510  NA 142 136  K 4.4 3.7  CL 109 111  CO2 26 24  BUN 21 24*  CREATININE 1.17* 1.24*  GLUCOSE 138* 115*  CALCIUM 8.4 8.1*    Recent Labs  02/01/14 0503  INR 0.98    EXAM General - Patient is Alert and Appropriate Extremity - Neurologically intact Neurovascular intact No cellulitis present Compartment soft Dressing/Incision - clean, dry, no drainage Motor Function - intact, moving foot and toes well on exam.   Past Medical History  Diagnosis Date  . Hypertension 10/1998  . Hyperlipidemia 12/2002  . Diastolic CHF   . Vaginal delivery     x4, at home  . Anemia 12/30-01/25/2006    [facility]- CHF- started lasix  . History of colon cancer     s/p partial colectomy  . History of ETT 03/15/2006    adenosine myoview low risk  . Hypothyroidism   . Microhematuria 03/2013    watchful waiting (MacDiarmid)  . CKD (chronic kidney disease), stage III 03/04/2010    Assessment/Plan: 2 Days Post-Op Procedure(s) (LRB): ARTHROPLASTY BIPOLAR HIP  (Left) Principal Problem:   Left displaced femoral neck fracture Active Problems:   History of pernicious anemia   Essential hypertension   Chronic diastolic CHF (congestive heart failure)   CKD (chronic kidney disease), stage III   Personal history of malignant neoplasm of gastrointestinal tract   Hypothyroidism   Closed displaced fracture of left femoral neck   Advance diet Up with therapy  Would benefit from SNF for short term stay as she is not following precautions well according to granddaughter Would be ready for SNF as early as tomorrow from our standpoint  DVT Prophylaxis - Lovenox x 10 days then 81 mg ASA daily for 4 weeks Weight Bearing As Tolerated left Leg  [place] V 02/03/2014, 7:55 AM

## 2014-02-04 ENCOUNTER — Encounter (HOSPITAL_COMMUNITY): Payer: Self-pay | Admitting: Orthopedic Surgery

## 2014-02-04 DIAGNOSIS — Z96642 Presence of left artificial hip joint: Secondary | ICD-10-CM | POA: Diagnosis not present

## 2014-02-04 DIAGNOSIS — S72142S Displaced intertrochanteric fracture of left femur, sequela: Secondary | ICD-10-CM | POA: Diagnosis not present

## 2014-02-04 DIAGNOSIS — D510 Vitamin B12 deficiency anemia due to intrinsic factor deficiency: Secondary | ICD-10-CM | POA: Diagnosis not present

## 2014-02-04 DIAGNOSIS — E039 Hypothyroidism, unspecified: Secondary | ICD-10-CM | POA: Diagnosis not present

## 2014-02-04 DIAGNOSIS — R609 Edema, unspecified: Secondary | ICD-10-CM | POA: Diagnosis not present

## 2014-02-04 DIAGNOSIS — I5032 Chronic diastolic (congestive) heart failure: Secondary | ICD-10-CM | POA: Diagnosis not present

## 2014-02-04 DIAGNOSIS — Z862 Personal history of diseases of the blood and blood-forming organs and certain disorders involving the immune mechanism: Secondary | ICD-10-CM | POA: Diagnosis not present

## 2014-02-04 DIAGNOSIS — I10 Essential (primary) hypertension: Secondary | ICD-10-CM | POA: Diagnosis not present

## 2014-02-04 DIAGNOSIS — R05 Cough: Secondary | ICD-10-CM | POA: Diagnosis not present

## 2014-02-04 DIAGNOSIS — Z471 Aftercare following joint replacement surgery: Secondary | ICD-10-CM | POA: Diagnosis not present

## 2014-02-04 DIAGNOSIS — Z9189 Other specified personal risk factors, not elsewhere classified: Secondary | ICD-10-CM | POA: Diagnosis not present

## 2014-02-04 DIAGNOSIS — N183 Chronic kidney disease, stage 3 (moderate): Secondary | ICD-10-CM | POA: Diagnosis not present

## 2014-02-04 DIAGNOSIS — N390 Urinary tract infection, site not specified: Secondary | ICD-10-CM | POA: Diagnosis not present

## 2014-02-04 DIAGNOSIS — Z9181 History of falling: Secondary | ICD-10-CM | POA: Diagnosis not present

## 2014-02-04 DIAGNOSIS — R2681 Unsteadiness on feet: Secondary | ICD-10-CM | POA: Diagnosis not present

## 2014-02-04 DIAGNOSIS — S72002S Fracture of unspecified part of neck of left femur, sequela: Secondary | ICD-10-CM | POA: Diagnosis not present

## 2014-02-04 DIAGNOSIS — M6281 Muscle weakness (generalized): Secondary | ICD-10-CM | POA: Diagnosis not present

## 2014-02-04 DIAGNOSIS — D62 Acute posthemorrhagic anemia: Secondary | ICD-10-CM | POA: Diagnosis not present

## 2014-02-04 DIAGNOSIS — S72002A Fracture of unspecified part of neck of left femur, initial encounter for closed fracture: Secondary | ICD-10-CM | POA: Diagnosis not present

## 2014-02-04 LAB — CBC
HCT: 30.2 % — ABNORMAL LOW (ref 36.0–46.0)
Hemoglobin: 10 g/dL — ABNORMAL LOW (ref 12.0–15.0)
MCH: 31.4 pg (ref 26.0–34.0)
MCHC: 33.1 g/dL (ref 30.0–36.0)
MCV: 95 fL (ref 78.0–100.0)
PLATELETS: 118 10*3/uL — AB (ref 150–400)
RBC: 3.18 MIL/uL — ABNORMAL LOW (ref 3.87–5.11)
RDW: 12.7 % (ref 11.5–15.5)
WBC: 9.8 10*3/uL (ref 4.0–10.5)

## 2014-02-04 LAB — URINE CULTURE: Colony Count: >=100000

## 2014-02-04 MED ORDER — METHOCARBAMOL 500 MG PO TABS: 500.0000 mg | ORAL_TABLET | Freq: Four times a day (QID) | ORAL | Status: DC | PRN

## 2014-02-04 MED ORDER — CIPROFLOXACIN HCL 500 MG PO TABS: 500.0000 mg | ORAL_TABLET | Freq: Two times a day (BID) | ORAL | Status: DC

## 2014-02-04 MED ORDER — ACETAMINOPHEN 325 MG PO TABS: 650.0000 mg | ORAL_TABLET | Freq: Four times a day (QID) | ORAL | Status: AC | PRN

## 2014-02-04 MED ORDER — ENOXAPARIN SODIUM 30 MG/0.3ML ~~LOC~~ SOLN: 30.0000 mg | SUBCUTANEOUS | Status: DC

## 2014-02-04 MED ORDER — HYDROCODONE-ACETAMINOPHEN 5-325 MG PO TABS: 1.0000 | ORAL_TABLET | ORAL | Status: DC | PRN

## 2014-02-04 NOTE — Discharge Summary (Addendum)
Physician Discharge Summary  [name] MVE:[id number] DOB: 09-19-1920 DOA: 01/31/2014  PCP: Ria Bush, MD  Admit date: 01/31/2014 Discharge date: 02/04/2014  Time spent: 30 minutes  Recommendations for Outpatient Follow-up:  1. Follow up with Orthopedic surgery as scheduled 2. Follow up with PCP in 2-3 weeks  Discharge Diagnoses:  Principal Problem:   Left displaced femoral neck fracture Active Problems:   History of pernicious anemia   Essential hypertension   Chronic diastolic CHF (congestive heart failure)   CKD (chronic kidney disease), stage III   Personal history of malignant neoplasm of gastrointestinal tract   Hypothyroidism   Closed displaced fracture of left femoral neck ecoli bacturia  Discharge Condition: Improved  Diet recommendation: Regular  Filed Weights   02/01/14 0034  Weight: 61.236 kg (135 lb)    History of present illness:  Please see admit h and p from 1/8 for details. Briefly , pt presents with acute L femoral neck fracture. Patient was admitted for further work up .  Hospital Course:  1. L femoral neck fracture 1. Via likely mechanical fall 2. Pt is s/p successful surgery 02/01/14. 3. Pain remained well controlled 4. Pending SNF placement 5. Ortho recs for lovenox x 10 days then 81mg  ASA for 4 weeks for DVT prophylaxis, weight bearing as tolerated 2. HTN 1. BP remained stable and controlled 3. Chronic diastolic CHF 1. Remained euvolemic 2. Cont to monitor 4. CKD3 1. Cr stable 2. Avoid nephrotoxic agents 3. Cont to follow 5. Pernicious anemia 1. Hgb remained stable at around 10-11 2. Cont to monitor 6. Hypothyroid 1. Continue thyroid replacement 7. DVT prophylaxis 1. Heparin subq while inpatient 2. Outpatient DVT prophylaxis per above 8. E.coli bacturia 1. Empirically treat with cipro x3 days  Procedures:  L hip hemiarthoplasty 02/01/14  Consultations:  Orthopedic Surgery  Discharge Exam: Filed Vitals:   02/03/14  2200 02/04/14 0300 02/04/14 0451 02/04/14 1417  BP: 140/52 145/59 116/69 118/44  Pulse: 105 107  93  Temp: 99.2 F (37.3 C) 100 F (37.8 C) 98.9 F (37.2 C) 98.7 F (37.1 C)  TempSrc: Oral Oral Oral Oral  Resp: 18 19 16 16   Height:      Weight:      SpO2: 99% 98% 97% 100%    General: Awake, in nad Cardiovascular: regular, s1, s2 Respiratory: normal resp effort, no wheezing  Discharge Instructions      Discharge Instructions    Call MD / Call 911    Complete by:  As directed   If you experience chest pain or shortness of breath, CALL 911 and be transported to the hospital emergency room.  If you develope a fever above 101 F, pus (white drainage) or increased drainage or redness at the wound, or calf pain, call your surgeon's office.     Change dressing    Complete by:  As directed   You may change your dressing dressing daily with sterile 4 x 4 inch gauze dressing and paper tape.  Do not submerge the incision under water.     Discharge instructions    Complete by:  As directed   Pick up stool softner and laxative for home use following surgery while on pain medications. Do not submerge incision under water. Please use good hand washing techniques while changing dressing each day. May shower starting three days after surgery. Please use a clean towel to pat the incision dry following showers. Continue to use ice for pain and swelling after surgery. Do not  use any lotions or creams on the incision until instructed by your surgeon. Hip precautions.  Total Hip Protocol.  Lovenox for seven more days and then discontinue Lovenox and resume the Baby Aspirin 81 mg daily from home.  Postoperative Constipation Protocol  Constipation - defined medically as fewer than three stools per week and severe constipation as less than one stool per week.  One of the most common issues patients have following surgery is constipation.  Even if you have a regular bowel pattern at home, your  normal regimen is likely to be disrupted due to multiple reasons following surgery.  Combination of anesthesia, postoperative narcotics, change in appetite and fluid intake all can affect your bowels.  In order to avoid complications following surgery, here are some recommendations in order to help you during your recovery period.  Colace (docusate) - Pick up an over-the-counter form of Colace or another stool softener and take twice a day as long as you are requiring postoperative pain medications.  Take with a full glass of water daily.  If you experience loose stools or diarrhea, hold the colace until you stool forms back up.  If your symptoms do not get better within 1 week or if they get worse, check with your doctor.  Dulcolax (bisacodyl) - Pick up over-the-counter and take as directed by the product packaging as needed to assist with the movement of your bowels.  Take with a full glass of water.  Use this product as needed if not relieved by Colace only.   MiraLax (polyethylene glycol) - Pick up over-the-counter to have on hand.  MiraLax is a solution that will increase the amount of water in your bowels to assist with bowel movements.  Take as directed and can mix with a glass of water, juice, soda, coffee, or tea.  Take if you go more than two days without a movement. Do not use MiraLax more than once per day. Call your doctor if you are still constipated or irregular after using this medication for 7 days in a row.  If you continue to have problems with postoperative constipation, please contact the office for further assistance and recommendations.  If you experience "the worst abdominal pain ever" or develop nausea or vomiting, please contact the office immediatly for further recommendations for treatment.     Do not put a pillow under the knee. Place it under the heel.    Complete by:  As directed      Do not sit on low chairs, stoools or toilet seats, as it may be difficult to get up from  low surfaces    Complete by:  As directed      Driving restrictions    Complete by:  As directed   No driving until released by the physician.     Follow the hip precautions as taught in Physical Therapy    Complete by:  As directed      Increase activity slowly as tolerated    Complete by:  As directed      Lifting restrictions    Complete by:  As directed   No lifting until released by the physician.     Patient may shower    Complete by:  As directed   You may shower without a dressing once there is no drainage.  Do not wash over the wound.  If drainage remains, do not shower until drainage stops.     TED hose    Complete by:  As directed   Use stockings (TED hose) for 3 weeks on both leg(s).  You may remove them at night for sleeping.     Weight bearing as tolerated    Complete by:  As directed   Laterality:  left  Extremity:  Lower     Weight bearing as tolerated    Complete by:  As directed   Laterality:  left  Extremity:  Lower            Medication List    STOP taking these medications        aspirin EC 81 MG tablet      TAKE these medications        acetaminophen 325 MG tablet  Commonly known as:  TYLENOL  Take 2 tablets (650 mg total) by mouth every 6 (six) hours as needed for mild pain or moderate pain (or Fever >/= 101).     amLODipine 5 MG tablet  Commonly known as:  NORVASC  TAKE 1 TABLET EVERY EVENING     beta carotene w/minerals tablet  Take 1 tablet by mouth daily.     ciprofloxacin 500 MG tablet  Commonly known as:  CIPRO  Take 1 tablet (500 mg total) by mouth 2 (two) times daily.     enoxaparin 30 MG/0.3ML injection  Commonly known as:  LOVENOX  Inject 0.3 mLs (30 mg total) into the skin daily. Take Lovenox injections for seven more days (for a total of ten days) and then discontinue the Lovenox and may resume the Baby Aspirin 81 mg daily from home.     ferrous sulfate 325 (65 FE) MG tablet  Take 325 mg by mouth 2 (two) times daily.      furosemide 20 MG tablet  Commonly known as:  LASIX  TAKE 1/2 TABLET EVERY MORNING.     HYDROcodone-acetaminophen 5-325 MG per tablet  Commonly known as:  NORCO/VICODIN  Take 1-2 tablets by mouth every 4 (four) hours as needed.     levothyroxine 25 MCG tablet  Commonly known as:  SYNTHROID, LEVOTHROID  TAKE AS DIRECTED ON MONDAY THRU FRIDAY.HOLD ON WEEKENDS.TAKE BEFORE BREAKFAST     lisinopril 5 MG tablet  Commonly known as:  PRINIVIL,ZESTRIL  TAKE 1 TABLET EVERY DAY     methocarbamol 500 MG tablet  Commonly known as:  ROBAXIN  Take 1 tablet (500 mg total) by mouth every 6 (six) hours as needed for muscle spasms.     vitamin B-12 1000 MCG tablet  Commonly known as:  CYANOCOBALAMIN  Take 1,000 mcg by mouth daily.     Vitamin D3 1000 UNITS Caps  Take by mouth 2 (two) times daily.       No Known Allergies Follow-up Information    Follow up with [facility].   Why:  Rolling walker   Contact information:   [address] [place] [address] [phone]       Follow up with [name], MD. Schedule an appointment as soon as possible for a visit on 02/14/2014.   Specialty:  Orthopedic Surgery   Why:  Call office at [phone] ASAP to set up appointment wtih Dr. Wynelle Link on Thursday 02/14/2014   Contact information:   [address] [place] [address] [phone]       Follow up with Ria Bush, MD. Schedule an appointment as soon as possible for a visit in 2 weeks.   Specialty:  Family Medicine   Contact information:  [place] [address] [phone]        The results of significant diagnostics from this hospitalization (including imaging, microbiology, ancillary and laboratory) are listed below for reference.    Significant Diagnostic Studies: Dg Chest 1 View  01/31/2014   CLINICAL DATA:  Status post fall. Concern for chest injury. Initial encounter.  EXAM: CHEST - 1 VIEW  COMPARISON:   Chest radiograph performed 03/12/2011  FINDINGS: The lungs are well-aerated hyperexpanded, with flattening of the hemidiaphragms, likely reflecting COPD. Minimal left basilar atelectasis or scarring is again noted. There is no evidence of pleural effusion or pneumothorax.  The cardiomediastinal silhouette is borderline normal in size. No acute osseous abnormalities are seen.  IMPRESSION: Findings suggest COPD. Minimal left basilar atelectasis or scarring noted. No displaced rib fracture seen.   Electronically Signed   By: Garald Balding M.D.   On: 01/31/2014 23:38   Pelvis Portable  02/01/2014   CLINICAL DATA:  Postop left total hip replacement  EXAM: PORTABLE PELVIS 1-2 VIEWS  COMPARISON:  None.  FINDINGS: Unipolar left hip replacement in anticipated position. Postsurgical change over the joint as anticipated including surgical drain.  IMPRESSION: Anticipated postoperative appear   Electronically Signed   By: Skipper Cliche M.D.   On: 02/01/2014 21:06   Dg Hip Unilat With Pelvis 2-3 Views Left  01/31/2014   CLINICAL DATA:  Lost balance and fell today at home while taking of shoes. LEFT hip pain.  EXAM: DG HIP W/ PELVIS 2-3V*L*  COMPARISON:  None available for comparison at time of study interpretation.  FINDINGS: LEFT femoral neck fracture with impaction, varus angulation distal bony fragments. No dislocation. No destructive bony lesions. Soft tissue planes are nonsuspicious.  IMPRESSION: Impacted displaced LEFT femoral neck fracture without dislocation.   Electronically Signed   By: Elon Alas   On: 01/31/2014 23:41    Microbiology: Recent Results (from the past 240 hour(s))  Urine culture     Status: None   Collection Time: 01/31/14 11:24 PM  Result Value Ref Range Status   Specimen Description URINE, CATHETERIZED  Final   Special Requests NONE  Final   Colony Count   Final    >=100,000 COLONIES/ML Performed at [facility]    Culture   Final    ESCHERICHIA COLI Performed at  [facility]    Report Status 02/04/2014 FINAL  Final   Organism ID, Bacteria ESCHERICHIA COLI  Final      Susceptibility   Escherichia coli - MIC*    AMPICILLIN <=2 SENSITIVE Sensitive     CEFAZOLIN <=4 SENSITIVE Sensitive     CEFTRIAXONE <=1 SENSITIVE Sensitive     CIPROFLOXACIN <=0.25 SENSITIVE Sensitive     GENTAMICIN <=1 SENSITIVE Sensitive     LEVOFLOXACIN <=0.12 SENSITIVE Sensitive     NITROFURANTOIN <=16 SENSITIVE Sensitive     TOBRAMYCIN <=1 SENSITIVE Sensitive     TRIMETH/SULFA <=20 SENSITIVE Sensitive     PIP/TAZO <=4 SENSITIVE Sensitive     * ESCHERICHIA COLI  Surgical pcr screen     Status: None   Collection Time: 02/01/14 10:49 AM  Result Value Ref Range Status   MRSA, PCR NEGATIVE NEGATIVE Final   Staphylococcus aureus NEGATIVE NEGATIVE Final    Comment:        The Xpert SA Assay (FDA approved for NASAL specimens in patients over 63 years of age), is one component of a comprehensive surveillance program.  Test performance has been validated by [facility] for patients greater than or equal to 31 year old. It is not intended to diagnose infection nor to guide or monitor treatment.      Labs: Basic Metabolic Panel:  Recent Labs Lab 01/31/14 2246 02/01/14 0503 02/02/14 0506 02/03/14 0510  NA 137 140 142 136  K 4.5 4.2 4.4 3.7  CL 104 107 109 111  CO2 25 25 26 24   GLUCOSE 142* 124* 138* 115*  BUN 32* 29* 21 24*  CREATININE 1.38* 1.20* 1.17* 1.24*  CALCIUM 9.2 9.0 8.4 8.1*   Liver Function Tests: No results for input(s): AST, ALT, ALKPHOS, BILITOT, PROT, ALBUMIN in the last 168 hours. No results for input(s): LIPASE, AMYLASE in the last 168 hours. No results for input(s): AMMONIA in the last 168 hours. CBC:  Recent Labs Lab 01/31/14 2246 02/01/14 0503 02/02/14 0506 02/03/14 0510 02/04/14 0500  WBC 10.0 10.6* 6.2 8.5 9.8  NEUTROABS 7.2  --   --   --   --   HGB 11.8* 10.9* 10.4* 9.6* 10.0*  HCT 35.3* 32.3* 32.2* 29.5* 30.2*  MCV  94.9 94.2 96.7 96.1 95.0  PLT 130* 121* 112* 95* 118*   Cardiac Enzymes: No results for input(s): CKTOTAL, CKMB, CKMBINDEX, TROPONINI in the last 168 hours. BNP: BNP (last 3 results) No results for input(s): PROBNP in the last 8760 hours. CBG:  Recent Labs Lab 02/01/14 0740  GLUCAP 115*   Signed:  CHIU, STEPHEN K  [facility] 02/04/2014, 4:08 PM

## 2014-02-04 NOTE — Progress Notes (Signed)
   Subjective: 3 Days Post-Op Procedure(s) (LRB): ARTHROPLASTY BIPOLAR HIP (Left) Patient reports pain as mild.   Patient seen in rounds by Dr. Wynelle Link. Patient is ready to go to the SNF from an Ortho standpoint.  Objective: Vital signs in last 24 hours: Temp:  [97.6 F (36.4 C)-100 F (37.8 C)] 98.9 F (37.2 C) (01/11 0451) Pulse Rate:  [87-107] 107 (01/11 0300) Resp:  [16-19] 16 (01/11 0451) BP: (116-145)/(52-71) 116/69 mmHg (01/11 0451) SpO2:  [97 %-100 %] 97 % (01/11 0451)  Intake/Output from previous day:  Intake/Output Summary (Last 24 hours) at 02/04/14 0723 Last data filed at 02/03/14 2200  Gross per 24 hour  Intake    840 ml  Output      0 ml  Net    840 ml    Labs:  Recent Labs  02/02/14 0506 02/03/14 0510 02/04/14 0500  HGB 10.4* 9.6* 10.0*    Recent Labs  02/03/14 0510 02/04/14 0500  WBC 8.5 9.8  RBC 3.07* 3.18*  HCT 29.5* 30.2*  PLT 95* 118*    Recent Labs  02/02/14 0506 02/03/14 0510  NA 142 136  K 4.4 3.7  CL 109 111  CO2 26 24  BUN 21 24*  CREATININE 1.17* 1.24*  GLUCOSE 138* 115*  CALCIUM 8.4 8.1*   No results for input(s): LABPT, INR in the last 72 hours.  EXAM: General - Patient is Alert Extremity - Neurovascular intact Sensation intact distally Incision - clean, dry, no drainage Motor Function - intact, moving foot and toes well on exam.   Assessment/Plan: 3 Days Post-Op Procedure(s) (LRB): ARTHROPLASTY BIPOLAR HIP (Left) Procedure(s) (LRB): ARTHROPLASTY BIPOLAR HIP (Left) Past Medical History  Diagnosis Date  . Hypertension 10/1998  . Hyperlipidemia 12/2002  . Diastolic CHF   . Vaginal delivery     x4, at home  . Anemia 12/30-01/25/2006    [facility]- CHF- started lasix  . History of colon cancer     s/p partial colectomy  . History of ETT 03/15/2006    adenosine myoview low risk  . Hypothyroidism   . Microhematuria 03/2013    watchful waiting ([facility])  . CKD (chronic kidney disease), stage III 03/04/2010    Principal Problem:   Left displaced femoral neck fracture Active Problems:   History of pernicious anemia   Essential hypertension   Chronic diastolic CHF (congestive heart failure)   CKD (chronic kidney disease), stage III   Personal history of malignant neoplasm of gastrointestinal tract   Hypothyroidism   Closed displaced fracture of left femoral neck  Estimated body mass index is 23.16 kg/(m^2) as calculated from the following:   Height as of this encounter: 5\' 4"  (1.626 m).   Weight as of this encounter: 61.236 kg (135 lb).  Transfer to SNF when okay from medicine.  Okay to transfer from orhto standpoint. Follow up - in 2 weeks.  Call office at [phone] to set up appointment with Dr. Wynelle Link Activity - WBAT to left leg Disposition - Skilled nursing facility DVT Prophylaxis - Lovenox for ten days and then resume aspirin. Pain medication - Norco for severe, otherwise Tylenol.  Arlee Muslim, PA-C Orthopaedic Surgery 02/04/2014, 7:23 AM

## 2014-02-04 NOTE — Discharge Instructions (Signed)
Dr. Gaynelle Arabian Total Joint Specialist [facility] [address] [place], [place] [address] [phone]   TOTAL HIP REPLACEMENT POSTOPERATIVE DIRECTIONS    Hip Rehabilitation, Guidelines Following Surgery  The results of a hip operation are greatly improved after range of motion and muscle strengthening exercises. Follow all safety measures which are given to protect your hip. If any of these exercises cause increased pain or swelling in your joint, decrease the amount until you are comfortable again. Then slowly increase the exercises. Call your caregiver if you have problems or questions.  HOME CARE INSTRUCTIONS  Most of the following instructions are designed to prevent the dislocation of your new hip.  Remove items at home which could result in a fall. This includes throw rugs or furniture in walking pathways.  Continue medications as instructed at time of discharge.  You may have some home medications which will be placed on hold until you complete the course of blood thinner medication.  You may start showering once you are discharged home but do not submerge the incision under water. Just pat the incision dry and apply a dry gauze dressing on daily. Do not put on socks or shoes without following the instructions of your caregivers.  Sit on high chairs so your hips are not bent more than 90 degrees.  Sit on chairs with arms. Use the chair arms to help push yourself up when arising.  Keep your leg on the side of the operation out in front of you when standing up.  Arrange for the use of a toilet seat elevator so you are not sitting low.  Do not do any exercises or get in any positions that cause your toes to point in (pigeon toed).  Always sleep with a pillow between your legs. Do not lie on your side in sleep with both knees touching the bed.   Walk with walker as instructed.  You may resume a sexual relationship in one month or when given the OK by  your caregiver.  Use walker as long as suggested by your caregivers.  You may put full weight on your legs and walk as much as is comfortable. Avoid periods of inactivity such as sitting longer than an hour when not asleep. This helps prevent blood clots.  You may return to work once you are cleared by Engineer, production.  Do not drive a car for 6 weeks or until released by your surgeon.  Do not drive while taking narcotics.  Wear elastic stockings for three weeks following surgery during the day but you may remove then at night.  Make sure you keep all of your appointments after your operation with all of your doctors and caregivers. You should call the office at the above phone number and make an appointment for approximately two weeks after the date of your surgery. Change the dressing daily and reapply a dry dressing each time. Please pick up a stool softener and laxative for home use as long as you are requiring pain medications.  ICE to the affected hip every three hours for 30 minutes at a time and then as needed for pain and swelling. Continue to use ice on the hip for pain and swelling from surgery. You may notice swelling that will progress down to the foot and ankle.  This is normal after  surgery.  Elevate the leg when you are not up walking on it.   It is important for you to complete the blood thinner  medication as prescribed by your doctor.  Continue to use the breathing machine which will help keep your temperature down.  It is common for your temperature to cycle up and down following surgery, especially at night when you are not up moving around and exerting yourself.  The breathing machine keeps your lungs expanded and your temperature down.  RANGE OF MOTION AND STRENGTHENING EXERCISES  These exercises are designed to help you keep full movement of your hip joint. Follow your caregiver's or physical therapist's instructions. Perform all exercises about fifteen times, three times per  day or as directed. Exercise both hips, even if you have had only one joint replacement. These exercises can be done on a training (exercise) mat, on the floor, on a table or on a bed. Use whatever works the best and is most comfortable for you. Use music or television while you are exercising so that the exercises are a pleasant break in your day. This will make your life better with the exercises acting as a break in routine you can look forward to.  Lying on your back, slowly slide your foot toward your buttocks, raising your knee up off the floor. Then slowly slide your foot back down until your leg is straight again.  Lying on your back spread your legs as far apart as you can without causing discomfort.  Lying on your side, raise your upper leg and foot straight up from the floor as far as is comfortable. Slowly lower the leg and repeat.  Lying on your back, tighten up the muscle in the front of your thigh (quadriceps muscles). You can do this by keeping your leg straight and trying to raise your heel off the floor. This helps strengthen the largest muscle supporting your knee.  Lying on your back, tighten up the muscles of your buttocks both with the legs straight and with the knee bent at a comfortable angle while keeping your heel on the floor.   SKILLED REHAB INSTRUCTIONS: If the patient is transferred to a skilled rehab facility following release from the hospital, a list of the current medications will be sent to the facility for the patient to continue.  When discharged from the skilled rehab facility, please have the facility set up the patient's [place] prior to being released. Also, the skilled facility will be responsible for providing the patient with their medications at time of release from the facility to include their pain medication, the muscle relaxants, and their blood thinner medication. If the patient is still at the rehab facility at time of the two week  follow up appointment, the skilled rehab facility will also need to assist the patient in arranging follow up appointment in our office and any transportation needs.  MAKE SURE YOU:  Understand these instructions.  Will watch your condition.  Will get help right away if you are not doing well or get worse.  Pick up stool softner and laxative for home use following surgery while on pain medications. Do not submerge incision under water. Please use good hand washing techniques while changing dressing each day. May shower starting three days after surgery. Please use a clean towel to pat the incision dry following showers. Continue to use ice for pain and swelling after surgery. Do not use any lotions or creams on the incision until instructed by your surgeon. Hip precautions.  Total Hip Protocol.  Take Lovenox injections for seven more days (for a total of ten days) and  then discontinue Lovenox and resume the Baby Aspirin 81 mg daily from home.  Postoperative Constipation Protocol  Constipation - defined medically as fewer than three stools per week and severe constipation as less than one stool per week.  One of the most common issues patients have following surgery is constipation.  Even if you have a regular bowel pattern at home, your normal regimen is likely to be disrupted due to multiple reasons following surgery.  Combination of anesthesia, postoperative narcotics, change in appetite and fluid intake all can affect your bowels.  In order to avoid complications following surgery, here are some recommendations in order to help you during your recovery period.  Colace (docusate) - Pick up an over-the-counter form of Colace or another stool softener and take twice a day as long as you are requiring postoperative pain medications.  Take with a full glass of water daily.  If you experience loose stools or diarrhea, hold the colace until you stool forms back up.  If your symptoms do not get  better within 1 week or if they get worse, check with your doctor.  Dulcolax (bisacodyl) - Pick up over-the-counter and take as directed by the product packaging as needed to assist with the movement of your bowels.  Take with a full glass of water.  Use this product as needed if not relieved by Colace only.   MiraLax (polyethylene glycol) - Pick up over-the-counter to have on hand.  MiraLax is a solution that will increase the amount of water in your bowels to assist with bowel movements.  Take as directed and can mix with a glass of water, juice, soda, coffee, or tea.  Take if you go more than two days without a movement. Do not use MiraLax more than once per day. Call your doctor if you are still constipated or irregular after using this medication for 7 days in a row.  If you continue to have problems with postoperative constipation, please contact the office for further assistance and recommendations.  If you experience "the worst abdominal pain ever" or develop nausea or vomiting, please contact the office immediatly for further recommendations for treatment.

## 2014-02-04 NOTE — Progress Notes (Signed)
Physical Therapy Treatment Patient Details Name: [name] MRN: [id number] DOB: 1920-07-04 Today's Date: 02/04/2014    History of Present Illness Pt admitted after falling ans sustaing Lt femoral neck fracture.  She underwent Lt hemiarthroplasty.  PMH includes: HTN; CHF; Colon CA; CKD    PT Comments    assited pt OOB to BSC to void.  Assisted with hygiene.  Assisted with amb in hallway a greater distance.  Positioned in recliner with many pillows.  Follow Up Recommendations  SNF     Equipment Recommendations       Recommendations for Other Services       Precautions / Restrictions Precautions Precautions: Posterior Hip Restrictions Weight Bearing Restrictions: No LLE Weight Bearing: Weight bearing as tolerated    Mobility  Bed Mobility Overal bed mobility: Needs Assistance Bed Mobility: Supine to Sit     Supine to sit: Max assist     General bed mobility comments: assisted OOB with increased time  Transfers Overall transfer level: Needs assistance Equipment used: Rolling walker (2 wheeled) Transfers: Sit to/from Stand Sit to Stand: Min assist         General transfer comment: 50% VC's on proper tech, THP and turn completion from bed to [facility].  Ambulation/Gait Ambulation/Gait assistance: Min assist Ambulation Distance (Feet): 42 Feet (one sitting rest break) Assistive device: Rolling walker (2 wheeled) Gait Pattern/deviations: Step-to pattern;Decreased stance time - left;Trunk flexed Gait velocity: decreased   General Gait Details: 25% VC's on proper walker to self distance and 75% VC's on safety with turns and backward gait to recliner.     Stairs            Wheelchair Mobility    Modified Rankin (Stroke Patients Only)       Balance                                    Cognition                            Exercises      General Comments        Pertinent Vitals/Pain Pain Assessment: Faces Faces Pain  Scale: Hurts a little bit Pain Location: L hip Pain Descriptors / Indicators: Grimacing Pain Intervention(s): Monitored during session    Home Living                      Prior Function            PT Goals (current goals can now be found in the care plan section) Progress towards PT goals: Progressing toward goals    Frequency  Min 6X/week    PT Plan      Co-evaluation             End of Session Equipment Utilized During Treatment: Gait belt Activity Tolerance: Patient tolerated treatment well Patient left: in chair;with call bell/phone within reach;with family/visitor present     Time: 0920-0945 PT Time Calculation (min) (ACUTE ONLY): 25 min  Charges:  $Gait Training: 8-22 mins $Therapeutic Activity: 8-22 mins                    G Codes:      Rica Koyanagi  PTA [facility]  Acute  Rehab Pager      [phone]

## 2014-02-04 NOTE — Progress Notes (Signed)
CSW assisting with d/c planning. Pt / family have chosen [facility] [employer]. Bed is available today if pt is ready for d/c.  Werner Lean LCSW [phone]

## 2014-02-04 NOTE — Care Management Note (Signed)
    Page 1 of 2   02/04/2014     12:45:08 PM CARE MANAGEMENT NOTE 02/04/2014  Patient:  [name], [name]   Account Number:  [id number]  Date Initiated:  02/02/2014  Documentation initiated by:  Apolonio Schneiders  Subjective/Objective Assessment:   Left hip hemiarthroplasty     Action/Plan:   from home.   Anticipated DC Date:  02/04/2014   Anticipated DC Plan:  SKILLED NURSING FACILITY  In-house referral  Clinical Social Worker      DC Planning Services  CM consult      [facility] Choice  DURABLE MEDICAL EQUIPMENT   Choice offered to / List presented to:  C-4 Adult Children   DME arranged  [place]      DME agency  [facility].        Status of service:  Completed, signed off Medicare Important Message given?  YES (If response is "NO", the following Medicare IM given date fields will be blank) Date Medicare IM given:  02/04/2014 Medicare IM given by:  [facility] Date Additional Medicare IM given:   Additional Medicare IM given by:    Discharge Disposition:  [place]  Per UR Regulation:  Reviewed for med. necessity/level of care/duration of stay  If discussed at [place] of Stay Meetings, dates discussed:    Comments:  02/04/14 Dessa Phi RN BSN NCM [phone] D/C snf.  02/02/14 1600 - CM spoke with patient and her daughter. Needs a rolling walker. Daughter selected [facility]. James at [facility] notified of walker request. Daughter mentioned the plan for possible SNF placement. States patient will need PT daily and may not do what the family asks her to do if home health PT is ordered. Venita Sheffield RN BSN CCM [phone]

## 2014-02-04 NOTE — Progress Notes (Signed)
Clinical Social Work Department CLINICAL SOCIAL WORK PLACEMENT NOTE 02/04/2014  Patient:  [name], [name]  Account Number:  [id number] Admit date:  01/31/2014  Clinical Social Worker:  Werner Lean, LCSW  Date/time:  02/01/2014 02:32 PM  Clinical Social Work is seeking post-discharge placement for this patient at the following level of care:   SKILLED NURSING   (*CSW will update this form in Epic as items are completed)   02/01/2014  Patient/family provided with [place] Department of Clinical Social Work's list of facilities offering this level of care within the geographic area requested by the patient (or if unable, by the patient's family).  02/01/2014  Patient/family informed of their freedom to choose among providers that offer the needed level of care, that participate in Medicare, Medicaid or managed care program needed by the patient, have an available bed and are willing to accept the patient.    Patient/family informed of [facility]' ownership interest in [facility], as well as of the fact that they are under no obligation to receive care at this facility.  PASARR submitted to EDS on 02/01/2014 PASARR number received on 02/01/2014  FL2 transmitted to all facilities in geographic area requested by pt/family on  02/01/2014 FL2 transmitted to all facilities within larger geographic area on   Patient informed that his/her managed care company has contracts with or will negotiate with  certain facilities, including the following:     Patient/family informed of bed offers received:  02/03/2014 Patient chooses bed at [facility] Physician recommends and patient chooses bed at    Patient to be transferred to [place] on  02/04/2014 Patient to be transferred to facility by [facility] Patient and family notified of transfer on 02/04/2014 Name of family member notified:  DAUGHTER  The following physician request were entered in Epic:   Additional  Comments: Pt / daughter in agreement with d/c to SNF. [facility] transport required. Pt / family aware cost may be involved with [facility] transport. NSG reviewed d/c summary, scripts, avs. Scripts included in d/c packet.  Werner Lean LCSW [phone]

## 2014-02-05 ENCOUNTER — Encounter: Payer: Self-pay | Admitting: Adult Health

## 2014-02-05 ENCOUNTER — Non-Acute Institutional Stay (SKILLED_NURSING_FACILITY): Payer: Medicare Other | Admitting: Adult Health

## 2014-02-05 DIAGNOSIS — N183 Chronic kidney disease, stage 3 unspecified: Secondary | ICD-10-CM

## 2014-02-05 DIAGNOSIS — S72002S Fracture of unspecified part of neck of left femur, sequela: Secondary | ICD-10-CM | POA: Diagnosis not present

## 2014-02-05 DIAGNOSIS — I10 Essential (primary) hypertension: Secondary | ICD-10-CM

## 2014-02-05 DIAGNOSIS — E039 Hypothyroidism, unspecified: Secondary | ICD-10-CM

## 2014-02-05 DIAGNOSIS — I5032 Chronic diastolic (congestive) heart failure: Secondary | ICD-10-CM | POA: Diagnosis not present

## 2014-02-05 DIAGNOSIS — D62 Acute posthemorrhagic anemia: Secondary | ICD-10-CM | POA: Diagnosis not present

## 2014-02-05 LAB — TYPE AND SCREEN
ABO/RH(D): A POS
ANTIBODY SCREEN: NEGATIVE
DONOR AG TYPE: NEGATIVE
DONOR AG TYPE: NEGATIVE
DONOR AG TYPE: NEGATIVE
Donor AG Type: NEGATIVE
UNIT DIVISION: 0
UNIT DIVISION: 0
UNIT DIVISION: 0
Unit division: 0

## 2014-02-05 NOTE — Progress Notes (Signed)
Patient ID: [name], female   DOB: [birth year], 81 y.o.   MRN: [id number]   02/05/2014  Facility:  Nursing Home Location:  [place] Room Number: 762-U LEVEL OF CARE:  SNF (31)   Chief Complaint  Patient presents with  . Hospitalization Follow-up    Left femoral neck fracture S/P left hip hemiarthroplasty anemia, chronic diastolic heart failure, hypothyroidism, hypertension and CKD stage III    HISTORY OF PRESENT ILLNESS:  This is a 81 year old female who has been admitted to [facility] on 02/04/14 from [facility]. She has a past medical history of hypertension, chronic diastolic heart failure and CK D stage III. Patient fell at home and sustained a left femoral neck fracture. She had a left hip hemi-arthroplasty done. She has been admitted for a short-term rehabilitation.  Patient is seen in her room sitting on wheelchair. Daughter @ bedside. She has no complaints of pain at this time. She is alert and oriented.  PAST MEDICAL HISTORY:  Past Medical History  Diagnosis Date  . Hypertension 10/1998  . Hyperlipidemia 12/2002  . Diastolic CHF   . Vaginal delivery     x4, at home  . Anemia 12/30-01/25/2006    [facility]- CHF- started lasix  . History of colon cancer     s/p partial colectomy  . History of ETT 03/15/2006    adenosine myoview low risk  . Hypothyroidism   . Microhematuria 03/2013    watchful waiting ([place])  . CKD (chronic kidney disease), stage III 03/04/2010    CURRENT MEDICATIONS: Reviewed per MAR/see medication list  No Known Allergies   REVIEW OF SYSTEMS:  GENERAL: no change in appetite, no fatigue, no weight changes, no fever, chills or weakness RESPIRATORY: no cough, SOB, DOE, wheezing, hemoptysis CARDIAC: no chest pain, edema or palpitations GI: no abdominal pain, diarrhea, constipation, heart burn, nausea or vomiting  PHYSICAL EXAMINATION  GENERAL: no acute distress, normal body habitus SKIN:  Left hip  surgical incision is dry, no redness EYES: conjunctivae normal, sclerae normal, normal eye lids NECK: supple, trachea midline, no neck masses, no thyroid tenderness, no thyromegaly LYMPHATICS: no LAN in the neck, no supraclavicular LAN RESPIRATORY: breathing is even & unlabored, BS CTAB CARDIAC: RRR, no murmur,no extra heart sounds, no edema GI: abdomen soft, normal BS, no masses, no tenderness, no hepatomegaly, no splenomegaly EXTREMITIES: Able to move 4 extremities PSYCHIATRIC: the patient is alert & oriented to person, affect & behavior appropriate  LABS/RADIOLOGY: Labs reviewed: Basic Metabolic Panel:  Recent Labs  11/06/13 0955  02/01/14 0503 02/02/14 0506 02/03/14 0510  NA 140  < > 140 142 136  K 4.5  < > 4.2 4.4 3.7  CL 107  < > 107 109 111  CO2 24  < > 25 26 24   GLUCOSE 94  < > 124* 138* 115*  BUN 29*  < > 29* 21 24*  CREATININE 1.5*  < > 1.20* 1.17* 1.24*  CALCIUM 9.9  < > 9.0 8.4 8.1*  PHOS 3.6  --   --   --   --   < > = values in this interval not displayed. Liver Function Tests:  Recent Labs  11/06/13 0955  ALBUMIN 3.2*   CBC:  Recent Labs  11/06/13 0955 01/31/14 2246  02/02/14 0506 02/03/14 0510 02/04/14 0500  WBC 5.9 10.0  < > 6.2 8.5 9.8  NEUTROABS 3.1 7.2  --   --   --   --  HGB 12.2 11.8*  < > 10.4* 9.6* 10.0*  HCT 36.8 35.3*  < > 32.2* 29.5* 30.2*  MCV 94.3 94.9  < > 96.7 96.1 95.0  PLT 131.0* 130*  < > 112* 95* 118*  < > = values in this interval not displayed.  Lipid Panel:  Recent Labs  11/06/13 0955  HDL 53.30   BNP: Invalid input(s): POCBNP CBG:  Recent Labs  02/01/14 0740  GLUCAP 115*     Dg Chest 1 View  01/31/2014   CLINICAL DATA:  Status post fall. Concern for chest injury. Initial encounter.  EXAM: CHEST - 1 VIEW  COMPARISON:  Chest radiograph performed 03/12/2011  FINDINGS: The lungs are well-aerated hyperexpanded, with flattening of the hemidiaphragms, likely reflecting COPD. Minimal left basilar atelectasis or  scarring is again noted. There is no evidence of pleural effusion or pneumothorax.  The cardiomediastinal silhouette is borderline normal in size. No acute osseous abnormalities are seen.  IMPRESSION: Findings suggest COPD. Minimal left basilar atelectasis or scarring noted. No displaced rib fracture seen.   Electronically Signed   By: Garald Balding M.D.   On: 01/31/2014 23:38   Pelvis Portable  02/01/2014   CLINICAL DATA:  Postop left total hip replacement  EXAM: PORTABLE PELVIS 1-2 VIEWS  COMPARISON:  None.  FINDINGS: Unipolar left hip replacement in anticipated position. Postsurgical change over the joint as anticipated including surgical drain.  IMPRESSION: Anticipated postoperative appear   Electronically Signed   By: Skipper Cliche M.D.   On: 02/01/2014 21:06   Dg Hip Unilat With Pelvis 2-3 Views Left  01/31/2014   CLINICAL DATA:  Lost balance and fell today at home while taking of shoes. LEFT hip pain.  EXAM: DG HIP W/ PELVIS 2-3V*L*  COMPARISON:  None available for comparison at time of study interpretation.  FINDINGS: LEFT femoral neck fracture with impaction, varus angulation distal bony fragments. No dislocation. No destructive bony lesions. Soft tissue planes are nonsuspicious.  IMPRESSION: Impacted displaced LEFT femoral neck fracture without dislocation.   Electronically Signed   By: Elon Alas   On: 01/31/2014 23:41    ASSESSMENT/PLAN:  Left femoral neck fracture S/P left hip hemiarthroplasty - for rehabilitation; continue Lovenox 30 mg subcutaneous daily 7 days then aspirin 81 mg by mouth daily for DVT prophylaxis; Norco 5/325 mg 1-2 tabs by mouth every 4 hours when necessary for pain and Robaxin 500 mg by mouth every 6 hours when necessary for muscle spasm Anemia, acute blood loss - hemoglobin 10.0; continue for sulfate 125 mg by mouth twice a day Chronic diastolic heart failure - stable; continue Lasix 20 mg 1/2 tab by mouth every morning Hypothyroidism - continue Synthroid 25  g 1 tablet by mouth every Mondays through Fridays Hypertension - well controlled; continue Norvasc 5 mg 1 tab by mouth every evening and lisinopril 5 mg by mouth daily CK D stage III - creatinine 1.24; check BMP   Goals of care:  Short-term rehabilitation   Labs/test ordered:  CBC, CMP   Spent 50 minutes in patient care.   [facility], NP [employer] [phone]

## 2014-02-06 ENCOUNTER — Non-Acute Institutional Stay (SKILLED_NURSING_FACILITY): Payer: Medicare Other | Admitting: Internal Medicine

## 2014-02-06 DIAGNOSIS — E039 Hypothyroidism, unspecified: Secondary | ICD-10-CM

## 2014-02-06 DIAGNOSIS — B9620 Unspecified Escherichia coli [E. coli] as the cause of diseases classified elsewhere: Secondary | ICD-10-CM

## 2014-02-06 DIAGNOSIS — I10 Essential (primary) hypertension: Secondary | ICD-10-CM | POA: Diagnosis not present

## 2014-02-06 DIAGNOSIS — S72142S Displaced intertrochanteric fracture of left femur, sequela: Secondary | ICD-10-CM | POA: Diagnosis not present

## 2014-02-06 DIAGNOSIS — N183 Chronic kidney disease, stage 3 unspecified: Secondary | ICD-10-CM

## 2014-02-06 DIAGNOSIS — N390 Urinary tract infection, site not specified: Secondary | ICD-10-CM | POA: Diagnosis not present

## 2014-02-06 DIAGNOSIS — D62 Acute posthemorrhagic anemia: Secondary | ICD-10-CM

## 2014-02-06 DIAGNOSIS — I5032 Chronic diastolic (congestive) heart failure: Secondary | ICD-10-CM | POA: Diagnosis not present

## 2014-02-06 NOTE — Progress Notes (Signed)
Patient ID: [name], female   DOB: [birth year], 81 y.o.   MRN: [id number]     [facility]   PCP: Ria Bush, MD  Code Status: full code  No Known Allergies  Chief Complaint  Patient presents with  . New Admit To SNF     HPI:  81 year old patient is here for short term rehabilitation post hospital admission from 01/31/14-02/04/14 with left femoral neck fracture post fall. She underwent left hip hemiarthroplasty and is here for rehabilitation. She mentions her pain being under control. Denies muscle spasm/ tightness. Had a bowel movement yesterday. Denies any concerns besides being tired from waking up early. Her elder daughter is present in the room.  Review of Systems:  Constitutional: Negative for fever, chills, diaphoresis.  HENT: Negative for headache, congestion, nasal discharge.  Respiratory: Negative for cough, shortness of breath and wheezing.   Cardiovascular: Negative for chest pain, palpitations. Has leg swelling.  Gastrointestinal: Negative for heartburn, nausea, vomiting, abdominal pain, constipation. appetite is fair Genitourinary: Negative for dysuria Skin: Negative for itching, rash.  Neurological: Negative for dizziness, tingling, focal weakness Psychiatric/Behavioral: Negative for depression  Past Medical History  Diagnosis Date  . Hypertension 10/1998  . Hyperlipidemia 12/2002  . Diastolic CHF   . Vaginal delivery     x4, at home  . Anemia 12/30-01/25/2006    [facility]- CHF- started lasix  . History of colon cancer     s/p partial colectomy  . History of ETT 03/15/2006    adenosine myoview low risk  . Hypothyroidism   . Microhematuria 03/2013    watchful waiting (MacDiarmid)  . CKD (chronic kidney disease), stage III 03/04/2010   Past Surgical History  Procedure Laterality Date  . Cholecystectomy  1984  . Partial colectomy  03/25/2006    right  . Esophagogastroduodenoscopy      B9 polyp H.Pylori bx- "dilated"  polyps  . History of abd  02/25/2006    bilat renal cysts, no mets  . Ct head limited w/o cm  02/2011    no acute process.  Mild to moderate cortical volume loss and scattered small vessel ischemic microangiopathy  . Colonoscopy  03/2010    1 tubular adenoma, int hemorrhoids, f/u prn [name]  . Hemiarthroplasty hip Left 01/2014    s/p fem fracture after fall  . Hip arthroplasty Left 02/01/2014    Procedure: ARTHROPLASTY BIPOLAR HIP;  Surgeon: [name], MD;  Location: [facility];  Service: Orthopedics;  Laterality: Left;   Social History:   reports that she has never smoked. She has never used smokeless tobacco. She reports that she does not drink alcohol or use illicit drugs.  Family History  Problem Relation Age of Onset  . Stroke Sister   . Diabetes Sister   . Hypertension Sister   . Stroke Mother   . Alcohol abuse Brother     Medications: Patient's Medications  New Prescriptions   No medications on file  Previous Medications   ACETAMINOPHEN (TYLENOL) 325 MG TABLET    Take 2 tablets (650 mg total) by mouth every 6 (six) hours as needed for mild pain or moderate pain (or Fever >/= 101).   AMLODIPINE (NORVASC) 5 MG TABLET    TAKE 1 TABLET EVERY EVENING   BETA CAROTENE W/MINERALS (OCUVITE) TABLET    Take 1 tablet by mouth daily.   CHOLECALCIFEROL (VITAMIN D3) 1000 UNITS CAPS    Take by mouth 2 (two) times daily.  CIPROFLOXACIN (CIPRO) 500 MG TABLET    Take 1 tablet (500 mg total) by mouth 2 (two) times daily.   ENOXAPARIN (LOVENOX) 30 MG/0.3ML INJECTION    Inject 0.3 mLs (30 mg total) into the skin daily. Take Lovenox injections for seven more days (for a total of ten days) and then discontinue the Lovenox and may resume the Baby Aspirin 81 mg daily from home.   FERROUS SULFATE 325 (65 FE) MG TABLET    Take 325 mg by mouth 2 (two) times daily.     FUROSEMIDE (LASIX) 20 MG TABLET    TAKE 1/2 TABLET EVERY MORNING.   HYDROCODONE-ACETAMINOPHEN (NORCO/VICODIN) 5-325 MG PER TABLET     Take 1-2 tablets by mouth every 4 (four) hours as needed.   LEVOTHYROXINE (SYNTHROID, LEVOTHROID) 25 MCG TABLET    TAKE AS DIRECTED ON MONDAY THRU FRIDAY.HOLD ON WEEKENDS.TAKE BEFORE BREAKFAST   LISINOPRIL (PRINIVIL,ZESTRIL) 5 MG TABLET    TAKE 1 TABLET EVERY DAY   METHOCARBAMOL (ROBAXIN) 500 MG TABLET    Take 1 tablet (500 mg total) by mouth every 6 (six) hours as needed for muscle spasms.   VITAMIN B-12 (CYANOCOBALAMIN) 1000 MCG TABLET    Take 1,000 mcg by mouth daily.    Modified Medications   No medications on file  Discontinued Medications   No medications on file     Physical Exam: Filed Vitals:   02/06/14 1501  BP: 118/60  Pulse: 88  Temp: 99.6 F (37.6 C)  Resp: 18  SpO2: 95%    General- elderly female, well built, in no acute distress Head- normocephalic, atraumatic Throat- moist mucus membrane Cardiovascular- normal s1,s2, no murmurs, palpable dorsalis pedis, left leg edema present Respiratory- bilateral clear to auscultation, no wheeze, no rhonchi, no crackles, no use of accessory muscles Abdomen- bowel sounds present, soft, non tender Musculoskeletal- able to move all 4 extremities, left leg rom limited. Ted hose present knee high Neurological- no focal deficit Skin- warm and dry, left thigh bruising noted, dressing clean and dry, steri strips in place, minimal bleed Psychiatry- alert and oriented with normal mood and affect    Labs reviewed: Basic Metabolic Panel:  Recent Labs  11/06/13 0955  02/01/14 0503 02/02/14 0506 02/03/14 0510  NA 140  < > 140 142 136  K 4.5  < > 4.2 4.4 3.7  CL 107  < > 107 109 111  CO2 24  < > 25 26 24   GLUCOSE 94  < > 124* 138* 115*  BUN 29*  < > 29* 21 24*  CREATININE 1.5*  < > 1.20* 1.17* 1.24*  CALCIUM 9.9  < > 9.0 8.4 8.1*  PHOS 3.6  --   --   --   --   < > = values in this interval not displayed. Liver Function Tests:  Recent Labs  11/06/13 0955  ALBUMIN 3.2*   No results for input(s): LIPASE, AMYLASE in the  last 8760 hours. No results for input(s): AMMONIA in the last 8760 hours. CBC:  Recent Labs  11/06/13 0955 01/31/14 2246  02/02/14 0506 02/03/14 0510 02/04/14 0500  WBC 5.9 10.0  < > 6.2 8.5 9.8  NEUTROABS 3.1 7.2  --   --   --   --   HGB 12.2 11.8*  < > 10.4* 9.6* 10.0*  HCT 36.8 35.3*  < > 32.2* 29.5* 30.2*  MCV 94.3 94.9  < > 96.7 96.1 95.0  PLT 131.0* 130*  < > 112* 95* 118*  < > =  values in this interval not displayed. Cardiac Enzymes: No results for input(s): CKTOTAL, CKMB, CKMBINDEX, TROPONINI in the last 8760 hours. BNP: Invalid input(s): POCBNP CBG:  Recent Labs  02/01/14 0740  GLUCAP 115*    Assessment/Plan  left femoral neck fracture S/p left hip arthroplasty. Will have him work with physical therapy and occupational therapy team to help with gait training and muscle strengthening exercises.fall precautions. Skin care. Encourage to be out of bed. Continue lovenox for 10 days then ASA 81 mg daily for 4 weeks for DVT prophylaxis, weight bearing as tolerated. Has f/u with orthopedics. Continue norco 5-325 1-2 tab q4h prn pain and robaxin for muscle spasm.  E.coli uti Patient was supposed to complete course of ciprofloxacin 500 mg bid on 02/07/14. Patient has not been getting this in facility. Will have her be treated with cipro 500 mg bid x 5 days in total  Hypertension Stable reading, continue amlodipine 5 mg daily and lisinopril 5 mg daily  Chronic diastolic CHF Stable at present, continue lasix, lisinopril, monitor bmp  ckd stage 3 Monitor renal function with her on lasix and lisinopril  Blood loss anemia Has hx of pernicious anemia and now had surgery. Continue ferrous sulfate and b12. Check h&h  Hypothyroidism  Continue levothyroxine 25 mcg daily    Goals of care: short term rehabilitation   Labs/tests ordered: pending cbc with diff, bmp  Family/ staff Communication: reviewed care plan with patient and nursing supervisor    Blanchie Serve,  MD  [facility] [phone] (Monday-Friday 8 am - 5 pm) [phone] (afterhours)

## 2014-02-20 ENCOUNTER — Encounter: Payer: Self-pay | Admitting: Adult Health

## 2014-02-20 ENCOUNTER — Non-Acute Institutional Stay (SKILLED_NURSING_FACILITY): Payer: Medicare Other | Admitting: Adult Health

## 2014-02-20 DIAGNOSIS — D62 Acute posthemorrhagic anemia: Secondary | ICD-10-CM

## 2014-02-20 DIAGNOSIS — I5032 Chronic diastolic (congestive) heart failure: Secondary | ICD-10-CM

## 2014-02-20 DIAGNOSIS — E039 Hypothyroidism, unspecified: Secondary | ICD-10-CM | POA: Diagnosis not present

## 2014-02-20 DIAGNOSIS — S72142S Displaced intertrochanteric fracture of left femur, sequela: Secondary | ICD-10-CM

## 2014-02-20 DIAGNOSIS — I10 Essential (primary) hypertension: Secondary | ICD-10-CM

## 2014-02-20 DIAGNOSIS — N183 Chronic kidney disease, stage 3 unspecified: Secondary | ICD-10-CM

## 2014-02-20 NOTE — Progress Notes (Signed)
Patient ID: [name], female   DOB: 23-May-1920, 80 y.o.   MRN: [id number]   02/20/2014  Facility:  [facility]ing Home Location:  [place] Room Number: 826-E LEVEL OF CARE:  SNF (31)   Chief Complaint  Patient presents with  . Discharge Note    Left displaced femoral neck fracture S/P left hip hemiarthroplasty, anemia, chronic diastolic heart failure, hypothyroidism, hypertension and CKD stage III    HISTORY OF PRESENT ILLNESS:  This is a 81 year old female who is for discharge home with home health PT, OT and Nursing. DME:  Standard wheelchair, cushion, leg rests and anti-tippers. She has been admitted to [facility] on 02/04/14 from [facility]. She has a past medical history of hypertension, chronic diastolic heart failure and CK D stage III. Patient fell at home and sustained a left femoral neck fracture. She had a left hip hemi-arthroplasty done. Patient was admitted to this facility for short-term rehabilitation after the patient's recent hospitalization.  Patient has completed SNF rehabilitation and therapy has cleared the patient for discharge.  PAST MEDICAL HISTORY:  Past Medical History  Diagnosis Date  . Hypertension 10/1998  . Hyperlipidemia 12/2002  . Diastolic CHF   . Vaginal delivery     x4, at home  . Anemia 12/30-01/25/2006    [facility]- CHF- started lasix  . History of colon cancer     s/p partial colectomy  . History of ETT 03/15/2006    adenosine myoview low risk  . Hypothyroidism   . Microhematuria 03/2013    watchful waiting (MacDiarmid)  . CKD (chronic kidney disease), stage III 03/04/2010    CURRENT MEDICATIONS: Reviewed per MAR/see medication list  No Known Allergies   REVIEW OF SYSTEMS:  GENERAL: no change in appetite, no fatigue, no weight changes, no fever, chills or weakness RESPIRATORY: no cough, SOB, DOE, wheezing, hemoptysis CARDIAC: no chest pain, or palpitations, +edema GI: no abdominal pain, diarrhea,  constipation, heart burn, nausea or vomiting  PHYSICAL EXAMINATION  GENERAL: no acute distress, normal body habitus SKIN:  Left hip surgical incision is dry, no redness NECK: supple, trachea midline, no neck masses, no thyroid tenderness, no thyromegaly LYMPHATICS: no LAN in the neck, no supraclavicular LAN RESPIRATORY: breathing is even & unlabored, BS CTAB CARDIAC: RRR, no murmur,no extra heart sounds, LLE edema 2+ GI: abdomen soft, normal BS, no masses, no tenderness, no hepatomegaly, no splenomegaly EXTREMITIES: Able to move 4 extremities PSYCHIATRIC: the patient is alert & oriented to person, affect & behavior appropriate  LABS/RADIOLOGY: 02/18/14  TSH 3.68 02/14/14  chest x-ray shows no acute cardiopulmonary disease 02/06/14  sodium 140 potassium 3.8 glucose 148 BUN 35 creatinine 1.3 calcium 9.0 total protein 5.4 albumin 3.2 total bilirubin 0.6 ALP 57 AST 24 ALT 47 GFR 39.39  WBC 8.6 hemoglobin 10.1 hematocrit 31.2 MCV 97.8  Labs reviewed: Basic Metabolic Panel:  Recent Labs  11/06/13 0955  02/01/14 0503 02/02/14 0506 02/03/14 0510  NA 140  < > 140 142 136  K 4.5  < > 4.2 4.4 3.7  CL 107  < > 107 109 111  CO2 24  < > 25 26 24   GLUCOSE 94  < > 124* 138* 115*  BUN 29*  < > 29* 21 24*  CREATININE 1.5*  < > 1.20* 1.17* 1.24*  CALCIUM 9.9  < > 9.0 8.4 8.1*  PHOS 3.6  --   --   --   --   < > = values  in this interval not displayed. Liver Function Tests:  Recent Labs  11/06/13 0955  ALBUMIN 3.2*   CBC:  Recent Labs  11/06/13 0955 01/31/14 2246  02/02/14 0506 02/03/14 0510 02/04/14 0500  WBC 5.9 10.0  < > 6.2 8.5 9.8  NEUTROABS 3.1 7.2  --   --   --   --   HGB 12.2 11.8*  < > 10.4* 9.6* 10.0*  HCT 36.8 35.3*  < > 32.2* 29.5* 30.2*  MCV 94.3 94.9  < > 96.7 96.1 95.0  PLT 131.0* 130*  < > 112* 95* 118*  < > = values in this interval not displayed.  Lipid Panel:  Recent Labs  11/06/13 0955  HDL 53.30   CBG:  Recent Labs  02/01/14 0740  GLUCAP 115*      Dg Chest 1 View  01/31/2014   CLINICAL DATA:  Status post fall. Concern for chest injury. Initial encounter.  EXAM: CHEST - 1 VIEW  COMPARISON:  Chest radiograph performed 03/12/2011  FINDINGS: The lungs are well-aerated hyperexpanded, with flattening of the hemidiaphragms, likely reflecting COPD. Minimal left basilar atelectasis or scarring is again noted. There is no evidence of pleural effusion or pneumothorax.  The cardiomediastinal silhouette is borderline normal in size. No acute osseous abnormalities are seen.  IMPRESSION: Findings suggest COPD. Minimal left basilar atelectasis or scarring noted. No displaced rib fracture seen.   Electronically Signed   By: Garald Balding M.D.   On: 01/31/2014 23:38   Pelvis Portable  02/01/2014   CLINICAL DATA:  Postop left total hip replacement  EXAM: PORTABLE PELVIS 1-2 VIEWS  COMPARISON:  None.  FINDINGS: Unipolar left hip replacement in anticipated position. Postsurgical change over the joint as anticipated including surgical drain.  IMPRESSION: Anticipated postoperative appear   Electronically Signed   By: Skipper Cliche M.D.   On: 02/01/2014 21:06   Dg Hip Unilat With Pelvis 2-3 Views Left  01/31/2014   CLINICAL DATA:  Lost balance and fell today at home while taking of shoes. LEFT hip pain.  EXAM: DG HIP W/ PELVIS 2-3V*L*  COMPARISON:  None available for comparison at time of study interpretation.  FINDINGS: LEFT femoral neck fracture with impaction, varus angulation distal bony fragments. No dislocation. No destructive bony lesions. Soft tissue planes are nonsuspicious.  IMPRESSION: Impacted displaced LEFT femoral neck fracture without dislocation.   Electronically Signed   By: Elon Alas   On: 01/31/2014 23:41    ASSESSMENT/PLAN:  Left femoral neck fracture S/P left hip hemiarthroplasty - for home health PT, OT and nursing; continue aspirin 81 mg by mouth daily for DVT prophylaxis; Norco 5/325 mg 1-2 tabs by mouth every 4 hours when  necessary for pain and Robaxin 500 mg by mouth every 6 hours when necessary for muscle spasm Anemia, acute blood loss - hemoglobin 10.; decrease ferrous sulfate 325 mg by mouth daily; check CBC Chronic diastolic heart failure - stable; increase Lasix 20 mg 1 tab by mouth every morning X 1 week then Lasix 20 mg 1/2 tab =10 mg PO Q D Hypothyroidism - tsh 3.68; continue Synthroid 25 g 1 tablet by mouth every Mondays through Fridays Hypertension - well controlled; continue Norvasc 5 mg 1 tab by mouth every evening and lisinopril 5 mg by mouth daily CK D stage III - creatinine 1.3 ; check BMP    I have filled out patient's discharge paperwork and written prescriptions.  Patient will receive home health PT, OT and Nursing.  DME provided: Standard  wheelchair, cushion, leg rests and anti-tippers  Total discharge time: Greater than 30 minutes  Discharge time involved coordination of the discharge process with social worker, nursing staff and therapy department. Medical justification for home health services/DME verified.    [facility], NP [employer] [phone]

## 2014-02-24 DIAGNOSIS — I129 Hypertensive chronic kidney disease with stage 1 through stage 4 chronic kidney disease, or unspecified chronic kidney disease: Secondary | ICD-10-CM | POA: Diagnosis not present

## 2014-02-24 DIAGNOSIS — Z9181 History of falling: Secondary | ICD-10-CM | POA: Diagnosis not present

## 2014-02-24 DIAGNOSIS — I5032 Chronic diastolic (congestive) heart failure: Secondary | ICD-10-CM | POA: Diagnosis not present

## 2014-02-24 DIAGNOSIS — Z85038 Personal history of other malignant neoplasm of large intestine: Secondary | ICD-10-CM | POA: Diagnosis not present

## 2014-02-24 DIAGNOSIS — Z96642 Presence of left artificial hip joint: Secondary | ICD-10-CM | POA: Diagnosis not present

## 2014-02-24 DIAGNOSIS — Z87440 Personal history of urinary (tract) infections: Secondary | ICD-10-CM | POA: Diagnosis not present

## 2014-02-24 DIAGNOSIS — Z471 Aftercare following joint replacement surgery: Secondary | ICD-10-CM | POA: Diagnosis not present

## 2014-02-24 DIAGNOSIS — N183 Chronic kidney disease, stage 3 (moderate): Secondary | ICD-10-CM | POA: Diagnosis not present

## 2014-02-24 DIAGNOSIS — D510 Vitamin B12 deficiency anemia due to intrinsic factor deficiency: Secondary | ICD-10-CM | POA: Diagnosis not present

## 2014-02-24 DIAGNOSIS — E785 Hyperlipidemia, unspecified: Secondary | ICD-10-CM | POA: Diagnosis not present

## 2014-02-25 ENCOUNTER — Telehealth: Payer: Self-pay | Admitting: Family Medicine

## 2014-02-25 ENCOUNTER — Encounter: Payer: Self-pay | Admitting: Family Medicine

## 2014-02-25 ENCOUNTER — Ambulatory Visit (INDEPENDENT_AMBULATORY_CARE_PROVIDER_SITE_OTHER): Payer: 59 | Admitting: Family Medicine

## 2014-02-25 VITALS — BP 100/42 | HR 80 | Temp 97.7°F | Wt 133.0 lb

## 2014-02-25 DIAGNOSIS — S72002S Fracture of unspecified part of neck of left femur, sequela: Secondary | ICD-10-CM

## 2014-02-25 DIAGNOSIS — R05 Cough: Secondary | ICD-10-CM | POA: Diagnosis not present

## 2014-02-25 DIAGNOSIS — E785 Hyperlipidemia, unspecified: Secondary | ICD-10-CM | POA: Diagnosis not present

## 2014-02-25 DIAGNOSIS — Z9181 History of falling: Secondary | ICD-10-CM | POA: Diagnosis not present

## 2014-02-25 DIAGNOSIS — I5032 Chronic diastolic (congestive) heart failure: Secondary | ICD-10-CM | POA: Diagnosis not present

## 2014-02-25 DIAGNOSIS — D510 Vitamin B12 deficiency anemia due to intrinsic factor deficiency: Secondary | ICD-10-CM | POA: Diagnosis not present

## 2014-02-25 DIAGNOSIS — I129 Hypertensive chronic kidney disease with stage 1 through stage 4 chronic kidney disease, or unspecified chronic kidney disease: Secondary | ICD-10-CM | POA: Diagnosis not present

## 2014-02-25 DIAGNOSIS — Z87440 Personal history of urinary (tract) infections: Secondary | ICD-10-CM | POA: Diagnosis not present

## 2014-02-25 DIAGNOSIS — J449 Chronic obstructive pulmonary disease, unspecified: Secondary | ICD-10-CM

## 2014-02-25 DIAGNOSIS — N183 Chronic kidney disease, stage 3 (moderate): Secondary | ICD-10-CM | POA: Diagnosis not present

## 2014-02-25 DIAGNOSIS — Z471 Aftercare following joint replacement surgery: Secondary | ICD-10-CM | POA: Diagnosis not present

## 2014-02-25 DIAGNOSIS — Z96642 Presence of left artificial hip joint: Secondary | ICD-10-CM | POA: Diagnosis not present

## 2014-02-25 DIAGNOSIS — I10 Essential (primary) hypertension: Secondary | ICD-10-CM | POA: Diagnosis not present

## 2014-02-25 DIAGNOSIS — Z85038 Personal history of other malignant neoplasm of large intestine: Secondary | ICD-10-CM | POA: Diagnosis not present

## 2014-02-25 DIAGNOSIS — R059 Cough, unspecified: Secondary | ICD-10-CM

## 2014-02-25 HISTORY — DX: Chronic obstructive pulmonary disease, unspecified: J44.9

## 2014-02-25 MED ORDER — BENZONATATE 100 MG PO CAPS: 100.0000 mg | ORAL_CAPSULE | Freq: Three times a day (TID) | ORAL | Status: DC | PRN

## 2014-02-25 NOTE — Assessment & Plan Note (Addendum)
Dry cough - ?ACEI related. Will stop lisinopril (and bp soft today). May use tessalon perls again as well. Pt agrees with plan.

## 2014-02-25 NOTE — Progress Notes (Signed)
BP 100/42 mmHg  Pulse 80  Temp(Src) 97.7 F (36.5 C) (Oral)  Wt 133 lb (60.328 kg)  SpO2 95%   CC: cough  Subjective:    Patient ID: [name], female    DOB: Nov 11, 1920, 81 y.o.   MRN: [id number]  HPI: [name] is a 81 y.o. female presenting on 02/25/2014 for Cough   Recent hospitalization mid January for L displaced femoral neck fracture s/p successful L hip hemiarthroplsaty 02/01/2014 and plan was for lovenox post surgery x 4 weeks but this was not on note from SNF [facility]) discharge. Sounds like she was discharged home on 02/20/2014. She is taking methocarbamol and hydrocodone as needed and doesn't have significant pain.  Has HHPT and RN.   Presents for acute visit for cough. Ongoing over last few weeks - dry cough. Describes tickle in back of throat. Denies GERD sxs, PNdrainage, allergies. No fevers/chills, ear or tooth pain, congestion, dyspnea, wheezing. L leg swelling continues to improve after hip fracture.  BP markedly low today - continues norvasc 5mg  and lisinopril 5mg  daily.   Lab Results  Component Value Date   CREATININE 1.24* 02/03/2014    Relevant past medical, surgical, family and social history reviewed and updated as indicated. Interim medical history since our last visit reviewed. Allergies and medications reviewed and updated. Current Outpatient Prescriptions on File Prior to Visit  Medication Sig  . acetaminophen (TYLENOL) 325 MG tablet Take 2 tablets (650 mg total) by mouth every 6 (six) hours as needed for mild pain or moderate pain (or Fever >/= 101).  Marland Kitchen amLODipine (NORVASC) 5 MG tablet TAKE 1 TABLET EVERY EVENING  . beta carotene w/minerals (OCUVITE) tablet Take 1 tablet by mouth daily.  . Cholecalciferol (VITAMIN D3) 1000 UNITS CAPS Take by mouth 2 (two) times daily.    . ciprofloxacin (CIPRO) 500 MG tablet Take 1 tablet (500 mg total) by mouth 2 (two) times daily.  Marland Kitchen enoxaparin (LOVENOX) 30 MG/0.3ML injection Inject 0.3 mLs (30 mg  total) into the skin daily. Take Lovenox injections for seven more days (for a total of ten days) and then discontinue the Lovenox and may resume the Baby Aspirin 81 mg daily from home.  . ferrous sulfate 325 (65 FE) MG tablet Take 325 mg by mouth 2 (two) times daily.    . furosemide (LASIX) 20 MG tablet TAKE 1/2 TABLET EVERY MORNING.  Marland Kitchen HYDROcodone-acetaminophen (NORCO/VICODIN) 5-325 MG per tablet Take 1-2 tablets by mouth every 4 (four) hours as needed.  Marland Kitchen levothyroxine (SYNTHROID, LEVOTHROID) 25 MCG tablet TAKE AS DIRECTED ON MONDAY THRU FRIDAY.HOLD ON WEEKENDS.TAKE BEFORE BREAKFAST  . lisinopril (PRINIVIL,ZESTRIL) 5 MG tablet TAKE 1 TABLET EVERY DAY  . vitamin B-12 (CYANOCOBALAMIN) 1000 MCG tablet Take 1,000 mcg by mouth daily.     No current facility-administered medications on file prior to visit.    Review of Systems Per HPI unless specifically indicated above     Objective:    BP 100/42 mmHg  Pulse 80  Temp(Src) 97.7 F (36.5 C) (Oral)  Wt 133 lb (60.328 kg)  SpO2 95%  Wt Readings from Last 3 Encounters:  02/25/14 133 lb (60.328 kg)  02/20/14 132 lb 6.4 oz (60.056 kg)  02/05/14 130 lb (58.968 kg)    Physical Exam  Constitutional: She appears well-developed and well-nourished. No distress.  HENT:  Mouth/Throat: Oropharynx is clear and moist. No oropharyngeal exudate.  Eyes: Conjunctivae and EOM are normal. Pupils are equal, round, and reactive to light.  Neck: Normal range of motion. Neck supple.  Cardiovascular: Normal rate, normal heart sounds and intact distal pulses.   No murmur heard. Pulmonary/Chest: Effort normal and breath sounds normal. No respiratory distress. She has no wheezes. She has no rales.  Clear lungs, harsh cough present  Musculoskeletal: She exhibits edema (1+ bilateral pedal edema).  Lymphadenopathy:    She has no cervical adenopathy.  Nursing note and vitals reviewed.      Assessment & Plan:   Problem List Items Addressed This Visit     Left displaced femoral neck fracture    S/p hemiarthroplasty on left and hospitalization then SNF placement. Overall recovering well. Will return in 2-3 weeks for hospital f/u visit. Pt/daughter agree.      Essential hypertension    bp very low today - will stop lisinopril and continue amlodipine - recheck in 2-3 wks at f/u visit.      Cough - Primary    Dry cough - ?ACEI related. Will stop lisinopril (and bp soft today). May use tessalon perls again as well. Pt agrees with plan.          Follow up plan: Return in about 3 weeks (around 03/18/2014), or as needed, for follow up visit.

## 2014-02-25 NOTE — Patient Instructions (Addendum)
Stop methocarbamol. May take tylenol as needed, if breakthrough pain then take hydrocodone pain pill. Stop lisinopril blood pressure medicine - may improve cough. Continue amlodipine 5mg  daily.  Return to see me in 2-3 weeks (after ortho appointment) Try tessalon perls for cough.

## 2014-02-25 NOTE — Assessment & Plan Note (Signed)
bp very low today - will stop lisinopril and continue amlodipine - recheck in 2-3 wks at f/u visit.

## 2014-02-25 NOTE — Progress Notes (Signed)
Pre visit review using our clinic review tool, if applicable. No additional management support is needed unless otherwise documented below in the visit note. 

## 2014-02-25 NOTE — Telephone Encounter (Signed)
[facility] Patient Name: MELISA DONOFRIO DOB: May 25, 1920 Initial Comment caller states mother has cough and has questions about giving her meds before her appt Nurse Assessment Nurse: Ronnald Ramp, RN, Miranda Date/Time (Eastern Time): 02/25/2014 8:55:17 AM Confirm and document reason for call. If symptomatic, describe symptoms. ---Caller states her mother has an appt this afternoon for cough. She wants to know if she needs to hold cough medication until after she has been seen. She has prescribed cough medication but does not seem to be helping. Triage declined. Has the patient traveled out of the country within the last 30 days? ---Not Applicable Does the patient require triage? ---Declined Triage Please document clinical information provided and list any resource used. ---Told caller ok to give patient her normal medications. Told caller to call back if she had any further questions or would like to discuss RN recommendations. Guidelines Guideline Title Affirmed Question Affirmed Notes Final Disposition User Clinical Call Ronnald Ramp, RN, [employer]

## 2014-02-25 NOTE — Assessment & Plan Note (Addendum)
S/p hemiarthroplasty on left and hospitalization then SNF placement. Overall recovering well. Will return in 2-3 weeks for hospital f/u visit. Pt/daughter agree.

## 2014-02-26 DIAGNOSIS — Z85038 Personal history of other malignant neoplasm of large intestine: Secondary | ICD-10-CM | POA: Diagnosis not present

## 2014-02-26 DIAGNOSIS — Z9181 History of falling: Secondary | ICD-10-CM | POA: Diagnosis not present

## 2014-02-26 DIAGNOSIS — D510 Vitamin B12 deficiency anemia due to intrinsic factor deficiency: Secondary | ICD-10-CM | POA: Diagnosis not present

## 2014-02-26 DIAGNOSIS — Z96642 Presence of left artificial hip joint: Secondary | ICD-10-CM | POA: Diagnosis not present

## 2014-02-26 DIAGNOSIS — Z471 Aftercare following joint replacement surgery: Secondary | ICD-10-CM | POA: Diagnosis not present

## 2014-02-26 DIAGNOSIS — N183 Chronic kidney disease, stage 3 (moderate): Secondary | ICD-10-CM | POA: Diagnosis not present

## 2014-02-26 DIAGNOSIS — E785 Hyperlipidemia, unspecified: Secondary | ICD-10-CM | POA: Diagnosis not present

## 2014-02-26 DIAGNOSIS — Z87440 Personal history of urinary (tract) infections: Secondary | ICD-10-CM | POA: Diagnosis not present

## 2014-02-26 DIAGNOSIS — I129 Hypertensive chronic kidney disease with stage 1 through stage 4 chronic kidney disease, or unspecified chronic kidney disease: Secondary | ICD-10-CM | POA: Diagnosis not present

## 2014-02-26 DIAGNOSIS — I5032 Chronic diastolic (congestive) heart failure: Secondary | ICD-10-CM | POA: Diagnosis not present

## 2014-02-27 DIAGNOSIS — Z87440 Personal history of urinary (tract) infections: Secondary | ICD-10-CM | POA: Diagnosis not present

## 2014-02-27 DIAGNOSIS — Z9181 History of falling: Secondary | ICD-10-CM | POA: Diagnosis not present

## 2014-02-27 DIAGNOSIS — D510 Vitamin B12 deficiency anemia due to intrinsic factor deficiency: Secondary | ICD-10-CM | POA: Diagnosis not present

## 2014-02-27 DIAGNOSIS — Z96642 Presence of left artificial hip joint: Secondary | ICD-10-CM | POA: Diagnosis not present

## 2014-02-27 DIAGNOSIS — Z471 Aftercare following joint replacement surgery: Secondary | ICD-10-CM | POA: Diagnosis not present

## 2014-02-27 DIAGNOSIS — I5032 Chronic diastolic (congestive) heart failure: Secondary | ICD-10-CM | POA: Diagnosis not present

## 2014-02-27 DIAGNOSIS — N183 Chronic kidney disease, stage 3 (moderate): Secondary | ICD-10-CM | POA: Diagnosis not present

## 2014-02-27 DIAGNOSIS — Z85038 Personal history of other malignant neoplasm of large intestine: Secondary | ICD-10-CM | POA: Diagnosis not present

## 2014-02-27 DIAGNOSIS — I129 Hypertensive chronic kidney disease with stage 1 through stage 4 chronic kidney disease, or unspecified chronic kidney disease: Secondary | ICD-10-CM | POA: Diagnosis not present

## 2014-02-27 DIAGNOSIS — E785 Hyperlipidemia, unspecified: Secondary | ICD-10-CM | POA: Diagnosis not present

## 2014-02-28 ENCOUNTER — Telehealth: Payer: Self-pay | Admitting: Family Medicine

## 2014-02-28 ENCOUNTER — Ambulatory Visit (INDEPENDENT_AMBULATORY_CARE_PROVIDER_SITE_OTHER)
Admission: RE | Admit: 2014-02-28 | Discharge: 2014-02-28 | Disposition: A | Discharge status: Home / Self Care | Payer: 59 | Source: Ambulatory Visit | Attending: Family Medicine | Admitting: Family Medicine

## 2014-02-28 ENCOUNTER — Other Ambulatory Visit (INDEPENDENT_AMBULATORY_CARE_PROVIDER_SITE_OTHER): Payer: 59

## 2014-02-28 DIAGNOSIS — R0602 Shortness of breath: Secondary | ICD-10-CM | POA: Diagnosis not present

## 2014-02-28 DIAGNOSIS — S72002S Fracture of unspecified part of neck of left femur, sequela: Secondary | ICD-10-CM

## 2014-02-28 DIAGNOSIS — R059 Cough, unspecified: Secondary | ICD-10-CM

## 2014-02-28 DIAGNOSIS — I5032 Chronic diastolic (congestive) heart failure: Secondary | ICD-10-CM

## 2014-02-28 DIAGNOSIS — R05 Cough: Secondary | ICD-10-CM

## 2014-02-28 DIAGNOSIS — R0789 Other chest pain: Secondary | ICD-10-CM | POA: Diagnosis not present

## 2014-02-28 LAB — BRAIN NATRIURETIC PEPTIDE: Pro B Natriuretic peptide (BNP): 165 pg/mL — ABNORMAL HIGH (ref 0.0–100.0)

## 2014-02-28 NOTE — Telephone Encounter (Signed)
See other phone note

## 2014-02-28 NOTE — Telephone Encounter (Signed)
Pt's daughter Dot called in with concerns about mother coughing and not being able to catch her breath.  She was not listed on DPR and told me that her Sister [name] had called in and they would not giver her any information, even though she is listed on dpr.  Please call [name] back at [phone] regarding mother's cough.  Thank you.

## 2014-02-28 NOTE — Telephone Encounter (Signed)
Patient Name: DEMARIE UHLIG DOB: 1920-10-07 Initial Comment Caller States mother has a cough, when she lays down she has trouble breathing, shortness of breath Nurse Assessment Nurse: Marcelline Deist, RN, Kermit Balo Date/Time (Eastern Time): 02/28/2014 9:29:30 AM Confirm and document reason for call. If symptomatic, describe symptoms. ---Caller states mother has a cough, when she lays down she has trouble breathing, shortness of breath. Had surgery in Jan. for her hip. Seen on Monday, gave her meds. The cough is not getting better, not sleeping well. No fever. Has the patient traveled out of the country within the last 30 days? ---Not Applicable Does the patient require triage? ---Yes Related visit to physician within the last 2 weeks? ---Yes Does the PT have any chronic conditions? (i.e. diabetes, asthma, etc.) ---No Guidelines Guideline Title Affirmed Question Affirmed Notes Cough - Chronic [1] Continuous coughing keeps from working AND [2] no improvement using cough treatment per protocol Final Disposition User See Physician within [facility], RN, Kermit Balo Comments Nurse needed to call caller back d/t inability to access schedule. When returned call, caller reports that pt. has c/o some chest pain which she thinks is from the coughing & has produced some colored mucus. Advised that pt. should probably then be seen in the ER to r/o pneumonia/issues with chest pain. Offered to look for an appt. even though the ER would have the necessary equipment to evaluate her. Caller is not sure what to do at this time. Will talk it over with family & decide.

## 2014-02-28 NOTE — Telephone Encounter (Signed)
Spoke with [name] and [name] does not feel that pt is in immediate need to be seen; pt has had cough and SOB since had surgery. Transferred [name] to [facility] and Shanon Brow will contact [name] with nurse.

## 2014-02-28 NOTE — Telephone Encounter (Signed)
Seen on Monday. plz call to f/u - is dyspnea with laying down new?  Would offer she come in for xray and labwork. If abnormal may need CT scan to r/o PE.

## 2014-02-28 NOTE — Telephone Encounter (Signed)
[facility] Patient Name: MONSERRATH JUNIO DOB: Sep 22, 1920 Initial Comment Caller States mother has cough, when she lays down she has trouble breathing, shortness of breath Nurse Assessment Nurse: Rock Nephew, RN, Juliann Pulse Date/Time (Eastern Time): 02/28/2014 8:50:55 AM Confirm and document reason for call. If symptomatic, describe symptoms. ---Caller states her Mother has a cough, when she lays down she has trouble breathing, and has shortness of breath. Has the patient traveled out of the country within the last 30 days? ---Not Applicable Does the patient require triage? ---No Please document clinical information provided and list any resource used. ---Caller not with her Mother, I asked her to have her sister call us right back ( she is with patient now ) and caller verbalized understanding. Guidelines Guideline Title Affirmed Question Affirmed Notes Information Only Call [1] Caller is not with the adult (patient) AND [2] probable NON-URGENT symptoms SOB only when lying down, cough Final Disposition User Home Care: Information or Advice Only Call Rock Nephew, RN, Juliann Pulse

## 2014-02-28 NOTE — Telephone Encounter (Signed)
Spoke with [name] and she said the dyspnea with laying down isn't really new since her surgery. I went ahead and made lab appt for today.

## 2014-03-01 ENCOUNTER — Encounter: Payer: Self-pay | Admitting: Family Medicine

## 2014-03-01 ENCOUNTER — Telehealth: Payer: Self-pay

## 2014-03-01 ENCOUNTER — Encounter (HOSPITAL_COMMUNITY)
Admission: RE | Admit: 2014-03-01 | Discharge: 2014-03-01 | Disposition: A | Discharge status: Home / Self Care | Payer: Medicare Other | Source: Ambulatory Visit | Attending: Family Medicine | Admitting: Family Medicine

## 2014-03-01 ENCOUNTER — Other Ambulatory Visit: Payer: Self-pay | Admitting: Family Medicine

## 2014-03-01 DIAGNOSIS — R05 Cough: Secondary | ICD-10-CM | POA: Insufficient documentation

## 2014-03-01 DIAGNOSIS — R791 Abnormal coagulation profile: Secondary | ICD-10-CM | POA: Diagnosis not present

## 2014-03-01 DIAGNOSIS — Z471 Aftercare following joint replacement surgery: Secondary | ICD-10-CM | POA: Diagnosis not present

## 2014-03-01 DIAGNOSIS — Z85038 Personal history of other malignant neoplasm of large intestine: Secondary | ICD-10-CM | POA: Diagnosis not present

## 2014-03-01 DIAGNOSIS — E785 Hyperlipidemia, unspecified: Secondary | ICD-10-CM | POA: Diagnosis not present

## 2014-03-01 DIAGNOSIS — D510 Vitamin B12 deficiency anemia due to intrinsic factor deficiency: Secondary | ICD-10-CM | POA: Diagnosis not present

## 2014-03-01 DIAGNOSIS — R059 Cough, unspecified: Secondary | ICD-10-CM

## 2014-03-01 DIAGNOSIS — R0600 Dyspnea, unspecified: Secondary | ICD-10-CM | POA: Diagnosis not present

## 2014-03-01 DIAGNOSIS — I5032 Chronic diastolic (congestive) heart failure: Secondary | ICD-10-CM | POA: Diagnosis not present

## 2014-03-01 DIAGNOSIS — I129 Hypertensive chronic kidney disease with stage 1 through stage 4 chronic kidney disease, or unspecified chronic kidney disease: Secondary | ICD-10-CM | POA: Diagnosis not present

## 2014-03-01 DIAGNOSIS — R0602 Shortness of breath: Secondary | ICD-10-CM | POA: Diagnosis not present

## 2014-03-01 DIAGNOSIS — Z87440 Personal history of urinary (tract) infections: Secondary | ICD-10-CM | POA: Diagnosis not present

## 2014-03-01 DIAGNOSIS — N183 Chronic kidney disease, stage 3 (moderate): Secondary | ICD-10-CM | POA: Diagnosis not present

## 2014-03-01 DIAGNOSIS — Z9181 History of falling: Secondary | ICD-10-CM | POA: Diagnosis not present

## 2014-03-01 DIAGNOSIS — Z96642 Presence of left artificial hip joint: Secondary | ICD-10-CM | POA: Diagnosis not present

## 2014-03-01 LAB — D-DIMER, QUANTITATIVE: D-Dimer, Quant: 3.37 ug/mL-FEU — ABNORMAL HIGH (ref 0.00–0.48)

## 2014-03-01 MED ORDER — TECHNETIUM TC 99M DIETHYLENETRIAME-PENTAACETIC ACID: 40.0000 | Freq: Once | INTRAVENOUS | Status: AC | PRN

## 2014-03-01 MED ORDER — LEVOFLOXACIN 250 MG PO TABS: ORAL_TABLET | ORAL | Status: DC

## 2014-03-01 MED ORDER — TECHNETIUM TO 99M ALBUMIN AGGREGATED
6.0000 | Freq: Once | INTRAVENOUS | Status: AC | PRN
Start: 2014-03-01 — End: 2014-03-01
  Administered 2014-03-01: 6 via INTRAVENOUS

## 2014-03-01 NOTE — Telephone Encounter (Signed)
plz give verbal ok for this.

## 2014-03-01 NOTE — Telephone Encounter (Signed)
plz notify scan returned negative for blood clot in lungs - but did show COPD.  Xray showed possible pneumonia - recommend treat with antibiotic course sent to pharmacy. Lab Results  Component Value Date   CREATININE 1.24* 02/03/2014  also recommend she use tessalon perls.

## 2014-03-01 NOTE — Telephone Encounter (Signed)
Marlowe Kays OT with [facility] left v/m requesting verbal orders for home health OT 2 x a week for 3 weeks.Please advise.

## 2014-03-01 NOTE — Telephone Encounter (Signed)
Bonnita Nasuti pts daughter left v/m requesting cb about pt cough is no better, pt was seen 02/25/14; Helen request med to help cough. Helen request cb.[facility].

## 2014-03-04 DIAGNOSIS — Z87440 Personal history of urinary (tract) infections: Secondary | ICD-10-CM | POA: Diagnosis not present

## 2014-03-04 DIAGNOSIS — Z96642 Presence of left artificial hip joint: Secondary | ICD-10-CM | POA: Diagnosis not present

## 2014-03-04 DIAGNOSIS — I129 Hypertensive chronic kidney disease with stage 1 through stage 4 chronic kidney disease, or unspecified chronic kidney disease: Secondary | ICD-10-CM | POA: Diagnosis not present

## 2014-03-04 DIAGNOSIS — Z471 Aftercare following joint replacement surgery: Secondary | ICD-10-CM | POA: Diagnosis not present

## 2014-03-04 DIAGNOSIS — N183 Chronic kidney disease, stage 3 (moderate): Secondary | ICD-10-CM | POA: Diagnosis not present

## 2014-03-04 DIAGNOSIS — D510 Vitamin B12 deficiency anemia due to intrinsic factor deficiency: Secondary | ICD-10-CM | POA: Diagnosis not present

## 2014-03-04 DIAGNOSIS — Z9181 History of falling: Secondary | ICD-10-CM | POA: Diagnosis not present

## 2014-03-04 DIAGNOSIS — Z85038 Personal history of other malignant neoplasm of large intestine: Secondary | ICD-10-CM | POA: Diagnosis not present

## 2014-03-04 DIAGNOSIS — I5032 Chronic diastolic (congestive) heart failure: Secondary | ICD-10-CM | POA: Diagnosis not present

## 2014-03-04 DIAGNOSIS — E785 Hyperlipidemia, unspecified: Secondary | ICD-10-CM | POA: Diagnosis not present

## 2014-03-04 NOTE — Telephone Encounter (Signed)
Connie notified

## 2014-03-04 NOTE — Telephone Encounter (Signed)
Spoke with patient's daughter on Friday afternoon (03/01/2014).  She stated she would get the ABX right away.

## 2014-03-05 DIAGNOSIS — Z87440 Personal history of urinary (tract) infections: Secondary | ICD-10-CM | POA: Diagnosis not present

## 2014-03-05 DIAGNOSIS — D510 Vitamin B12 deficiency anemia due to intrinsic factor deficiency: Secondary | ICD-10-CM | POA: Diagnosis not present

## 2014-03-05 DIAGNOSIS — Z9181 History of falling: Secondary | ICD-10-CM | POA: Diagnosis not present

## 2014-03-05 DIAGNOSIS — I129 Hypertensive chronic kidney disease with stage 1 through stage 4 chronic kidney disease, or unspecified chronic kidney disease: Secondary | ICD-10-CM | POA: Diagnosis not present

## 2014-03-05 DIAGNOSIS — N183 Chronic kidney disease, stage 3 (moderate): Secondary | ICD-10-CM | POA: Diagnosis not present

## 2014-03-05 DIAGNOSIS — Z471 Aftercare following joint replacement surgery: Secondary | ICD-10-CM | POA: Diagnosis not present

## 2014-03-05 DIAGNOSIS — Z85038 Personal history of other malignant neoplasm of large intestine: Secondary | ICD-10-CM | POA: Diagnosis not present

## 2014-03-05 DIAGNOSIS — Z96642 Presence of left artificial hip joint: Secondary | ICD-10-CM | POA: Diagnosis not present

## 2014-03-05 DIAGNOSIS — I5032 Chronic diastolic (congestive) heart failure: Secondary | ICD-10-CM | POA: Diagnosis not present

## 2014-03-05 DIAGNOSIS — E785 Hyperlipidemia, unspecified: Secondary | ICD-10-CM | POA: Diagnosis not present

## 2014-03-06 DIAGNOSIS — D510 Vitamin B12 deficiency anemia due to intrinsic factor deficiency: Secondary | ICD-10-CM | POA: Diagnosis not present

## 2014-03-06 DIAGNOSIS — I5032 Chronic diastolic (congestive) heart failure: Secondary | ICD-10-CM | POA: Diagnosis not present

## 2014-03-06 DIAGNOSIS — N183 Chronic kidney disease, stage 3 (moderate): Secondary | ICD-10-CM | POA: Diagnosis not present

## 2014-03-06 DIAGNOSIS — E785 Hyperlipidemia, unspecified: Secondary | ICD-10-CM | POA: Diagnosis not present

## 2014-03-06 DIAGNOSIS — I129 Hypertensive chronic kidney disease with stage 1 through stage 4 chronic kidney disease, or unspecified chronic kidney disease: Secondary | ICD-10-CM | POA: Diagnosis not present

## 2014-03-06 DIAGNOSIS — Z87440 Personal history of urinary (tract) infections: Secondary | ICD-10-CM | POA: Diagnosis not present

## 2014-03-06 DIAGNOSIS — Z471 Aftercare following joint replacement surgery: Secondary | ICD-10-CM | POA: Diagnosis not present

## 2014-03-06 DIAGNOSIS — Z9181 History of falling: Secondary | ICD-10-CM | POA: Diagnosis not present

## 2014-03-06 DIAGNOSIS — Z85038 Personal history of other malignant neoplasm of large intestine: Secondary | ICD-10-CM | POA: Diagnosis not present

## 2014-03-06 DIAGNOSIS — Z96642 Presence of left artificial hip joint: Secondary | ICD-10-CM | POA: Diagnosis not present

## 2014-03-07 DIAGNOSIS — I129 Hypertensive chronic kidney disease with stage 1 through stage 4 chronic kidney disease, or unspecified chronic kidney disease: Secondary | ICD-10-CM | POA: Diagnosis not present

## 2014-03-07 DIAGNOSIS — D510 Vitamin B12 deficiency anemia due to intrinsic factor deficiency: Secondary | ICD-10-CM | POA: Diagnosis not present

## 2014-03-07 DIAGNOSIS — Z96642 Presence of left artificial hip joint: Secondary | ICD-10-CM | POA: Diagnosis not present

## 2014-03-07 DIAGNOSIS — I5032 Chronic diastolic (congestive) heart failure: Secondary | ICD-10-CM | POA: Diagnosis not present

## 2014-03-07 DIAGNOSIS — Z9181 History of falling: Secondary | ICD-10-CM | POA: Diagnosis not present

## 2014-03-07 DIAGNOSIS — Z471 Aftercare following joint replacement surgery: Secondary | ICD-10-CM | POA: Diagnosis not present

## 2014-03-07 DIAGNOSIS — Z87440 Personal history of urinary (tract) infections: Secondary | ICD-10-CM | POA: Diagnosis not present

## 2014-03-07 DIAGNOSIS — Z85038 Personal history of other malignant neoplasm of large intestine: Secondary | ICD-10-CM | POA: Diagnosis not present

## 2014-03-07 DIAGNOSIS — N183 Chronic kidney disease, stage 3 (moderate): Secondary | ICD-10-CM | POA: Diagnosis not present

## 2014-03-07 DIAGNOSIS — E785 Hyperlipidemia, unspecified: Secondary | ICD-10-CM | POA: Diagnosis not present

## 2014-03-08 DIAGNOSIS — D510 Vitamin B12 deficiency anemia due to intrinsic factor deficiency: Secondary | ICD-10-CM | POA: Diagnosis not present

## 2014-03-08 DIAGNOSIS — I5032 Chronic diastolic (congestive) heart failure: Secondary | ICD-10-CM | POA: Diagnosis not present

## 2014-03-08 DIAGNOSIS — Z9181 History of falling: Secondary | ICD-10-CM | POA: Diagnosis not present

## 2014-03-08 DIAGNOSIS — I129 Hypertensive chronic kidney disease with stage 1 through stage 4 chronic kidney disease, or unspecified chronic kidney disease: Secondary | ICD-10-CM | POA: Diagnosis not present

## 2014-03-08 DIAGNOSIS — Z87440 Personal history of urinary (tract) infections: Secondary | ICD-10-CM | POA: Diagnosis not present

## 2014-03-08 DIAGNOSIS — Z85038 Personal history of other malignant neoplasm of large intestine: Secondary | ICD-10-CM | POA: Diagnosis not present

## 2014-03-08 DIAGNOSIS — N183 Chronic kidney disease, stage 3 (moderate): Secondary | ICD-10-CM | POA: Diagnosis not present

## 2014-03-08 DIAGNOSIS — E785 Hyperlipidemia, unspecified: Secondary | ICD-10-CM | POA: Diagnosis not present

## 2014-03-08 DIAGNOSIS — Z96642 Presence of left artificial hip joint: Secondary | ICD-10-CM | POA: Diagnosis not present

## 2014-03-08 DIAGNOSIS — Z471 Aftercare following joint replacement surgery: Secondary | ICD-10-CM | POA: Diagnosis not present

## 2014-03-09 ENCOUNTER — Ambulatory Visit (INDEPENDENT_AMBULATORY_CARE_PROVIDER_SITE_OTHER): Payer: 59 | Admitting: Family Medicine

## 2014-03-09 VITALS — BP 132/72 | HR 81 | Temp 97.7°F | Wt 131.0 lb

## 2014-03-09 DIAGNOSIS — R350 Frequency of micturition: Secondary | ICD-10-CM | POA: Insufficient documentation

## 2014-03-09 LAB — POCT URINALYSIS DIPSTICK
BILIRUBIN UA: NEGATIVE
Glucose, UA: NEGATIVE
Ketones, UA: 0.5
Nitrite, UA: NEGATIVE
Spec Grav, UA: 1.025
Urobilinogen, UA: NEGATIVE
pH, UA: 6

## 2014-03-09 NOTE — Assessment & Plan Note (Signed)
No other symptoms. Slightly positive UA but may be contaminant given age...cannot perform [facility] at [facility].  ? bladder irritation.  Send urine for culture. Drink fluids and avoid bladder irritants.

## 2014-03-09 NOTE — Addendum Note (Signed)
Addended by: Lurlean Nanny on: 03/09/2014 12:37 PM   Modules accepted: Orders

## 2014-03-09 NOTE — Progress Notes (Signed)
   Subjective:    Patient ID: [name], female    DOB: April 17, 1920, 81 y.o.   MRN: [id number]  HPI   81 year old female with history of HTN, CK, CHF presents with  frequent urination x 1 days. Got up every hour last night, but not frequent anymore this AM  No burning with urination. No blood in urine.No abdominal pain. No flank pain.  No fever.  Recent hip surgery 1/8,went to rehab, was catheterized. No history of frequent UTI.    Review of Systems  Constitutional: Negative for fever and fatigue.  HENT: Negative for ear pain.   Eyes: Negative for pain.  Respiratory: Negative for chest tightness and shortness of breath.   Cardiovascular: Negative for chest pain, palpitations and leg swelling.  Gastrointestinal: Negative for abdominal pain.  Genitourinary: Negative for dysuria.       Objective:   Physical Exam  Constitutional: Vital signs are normal. She appears well-developed and well-nourished. She is cooperative.  Non-toxic appearance. She does not appear ill. No distress.  HENT:  Head: Normocephalic.  Right Ear: Hearing, tympanic membrane, external ear and ear canal normal. Tympanic membrane is not erythematous, not retracted and not bulging.  Left Ear: Hearing, tympanic membrane, external ear and ear canal normal. Tympanic membrane is not erythematous, not retracted and not bulging.  Nose: No mucosal edema or rhinorrhea. Right sinus exhibits no maxillary sinus tenderness and no frontal sinus tenderness. Left sinus exhibits no maxillary sinus tenderness and no frontal sinus tenderness.  Mouth/Throat: Uvula is midline, oropharynx is clear and moist and mucous membranes are normal.  Eyes: Conjunctivae, EOM and lids are normal. Pupils are equal, round, and reactive to light. Lids are everted and swept, no foreign bodies found.  Neck: Trachea normal and normal range of motion. Neck supple. Carotid bruit is not present. No thyroid mass and no thyromegaly present.    Cardiovascular: Normal rate, regular rhythm, S1 normal, S2 normal, normal heart sounds, intact distal pulses and normal pulses.  Exam reveals no gallop and no friction rub.   No murmur heard. Pulmonary/Chest: Effort normal and breath sounds normal. No tachypnea. No respiratory distress. She has no decreased breath sounds. She has no wheezes. She has no rhonchi. She has no rales.  Abdominal: Soft. Normal appearance and bowel sounds are normal. There is no tenderness. There is no rigidity, no rebound, no guarding and no CVA tenderness.  Neurological: She is alert.  Skin: Skin is warm, dry and intact. No rash noted.  Psychiatric: Her speech is normal and behavior is normal. Judgment and thought content normal. Her mood appears not anxious. Cognition and memory are normal. She does not exhibit a depressed mood.          Assessment & Plan:

## 2014-03-09 NOTE — Patient Instructions (Signed)
We will call with urine culture results.  For now drink fluids( water) and avoid bladder irritants like tomato, citris, caffeine, spicy foods, alcohol etc.  Call if fever or new pain with urination.

## 2014-03-09 NOTE — Progress Notes (Signed)
Pre visit review using our clinic review tool, if applicable. No additional management support is needed unless otherwise documented below in the visit note. 

## 2014-03-11 ENCOUNTER — Telehealth: Payer: Self-pay

## 2014-03-11 NOTE — Telephone Encounter (Signed)
PLEASE NOTE: All timestamps contained within this report are represented as [place] Standard Time. CONFIDENTIALTY NOTICE: This fax transmission is intended only for the addressee. It contains information that is legally privileged, confidential or otherwise protected from use or disclosure. If you are not the intended recipient, you are strictly prohibited from reviewing, disclosing, copying using or disseminating any of this information or taking any action in reliance on or regarding this information. If you have received this fax in error, please notify us immediately by telephone so that we can arrange for its return to [name]. Phone: [phone], Toll-Free: [phone], Fax: [phone] Page: 1 of 2 Call Id: [id number] [place] Patient Name: [name] Gender: Female DOB: Apr 16, 1920 Age: 81 Y 2 M 29 D Return Phone Number: [phone] (Alternate) Address[place] [address] Client [place] Night - Client Client Site [place], [place] Contact Type Call Call Type Triage / [place] Name Bonnita Nasuti Relationship To Patient Daughter Return Phone Number [phone] (Alternate) Chief Complaint Urination Frequency Initial Comment Caller states her mother was getting up to urinate hourly last night PreDisposition Call Doctor Nurse Assessment Nurse: Thad Ranger, RN, Langley Gauss Date/Time (Eastern Time): 03/09/2014 7:37:02 AM Confirm and document reason for call. If symptomatic, describe symptoms. ---Caller states her mother was getting up to urinate hourly last night. States the pt is w/o back pain or fever. Has the patient traveled out of the country within the last 30 days? ---Not Applicable Does the patient require triage? ---Yes Related visit to physician within the last 2 weeks? ---No Does the PT  have any chronic conditions? (i.e. diabetes, asthma, etc.) ---Yes List chronic conditions. ---Hip surgery Guidelines Guideline Title Affirmed Question Affirmed Notes Nurse Date/Time Eilene Ghazi Time) Urination Pain - Female Artificial heart valve or artificial joint S/P partial hip replacement 01/2014 Carmon, RN, Denise 03/09/2014 7:38:07 AM Disp. Time Eilene Ghazi Time) Disposition Final User 03/09/2014 7:43:59 AM Call Completed Thad Ranger, RN, Langley Gauss 03/09/2014 7:41:55 AM See Physician within 4 Hours (or PCP triage) Yes Carmon, RN, Yevette Edwards Understands: Yes Disagree/Comply: Comply PLEASE NOTE: All timestamps contained within this report are represented as [place] Standard Time. CONFIDENTIALTY NOTICE: This fax transmission is intended only for the addressee. It contains information that is legally privileged, confidential or otherwise protected from use or disclosure. If you are not the intended recipient, you are strictly prohibited from reviewing, disclosing, copying using or disseminating any of this information or taking any action in reliance on or regarding this information. If you have received this fax in error, please notify us immediately by telephone so that we can arrange for its return to [name]. Phone: [phone], Toll-Free: [phone], Fax: [phone] Page: 2 of 2 Call Id: [id number] Care Advice Given Per Guideline SEE PHYSICIAN WITHIN 4 HOURS (or PCP triage): CALL BACK IF: * You become worse. CARE ADVICE given per Urination Pain - Female (Adult) guideline. After Care Instructions Given Call Event Type User Date / Time Description Comments User: Romeo Apple, RN Date/Time Eilene Ghazi Time): 03/09/2014 7:43:45 AM Advised pt to be seen at the [facility] office during Saturday hours 9:00am - 1:00pm [address], [place], [place] [address] Inst to call, [phone], Saturday morning at 9:00am for appointment. Referrals [facility]

## 2014-03-11 NOTE — Telephone Encounter (Signed)
Noted thanks. Awaiting UCx.

## 2014-03-11 NOTE — Telephone Encounter (Signed)
Pt was seen [facility] on 03/09/14.

## 2014-03-12 DIAGNOSIS — Z85038 Personal history of other malignant neoplasm of large intestine: Secondary | ICD-10-CM | POA: Diagnosis not present

## 2014-03-12 DIAGNOSIS — D510 Vitamin B12 deficiency anemia due to intrinsic factor deficiency: Secondary | ICD-10-CM | POA: Diagnosis not present

## 2014-03-12 DIAGNOSIS — Z9181 History of falling: Secondary | ICD-10-CM | POA: Diagnosis not present

## 2014-03-12 DIAGNOSIS — I129 Hypertensive chronic kidney disease with stage 1 through stage 4 chronic kidney disease, or unspecified chronic kidney disease: Secondary | ICD-10-CM | POA: Diagnosis not present

## 2014-03-12 DIAGNOSIS — Z96642 Presence of left artificial hip joint: Secondary | ICD-10-CM | POA: Diagnosis not present

## 2014-03-12 DIAGNOSIS — N183 Chronic kidney disease, stage 3 (moderate): Secondary | ICD-10-CM | POA: Diagnosis not present

## 2014-03-12 DIAGNOSIS — I5032 Chronic diastolic (congestive) heart failure: Secondary | ICD-10-CM | POA: Diagnosis not present

## 2014-03-12 DIAGNOSIS — Z87440 Personal history of urinary (tract) infections: Secondary | ICD-10-CM | POA: Diagnosis not present

## 2014-03-12 DIAGNOSIS — Z471 Aftercare following joint replacement surgery: Secondary | ICD-10-CM | POA: Diagnosis not present

## 2014-03-12 DIAGNOSIS — E785 Hyperlipidemia, unspecified: Secondary | ICD-10-CM | POA: Diagnosis not present

## 2014-03-13 DIAGNOSIS — N183 Chronic kidney disease, stage 3 (moderate): Secondary | ICD-10-CM | POA: Diagnosis not present

## 2014-03-13 DIAGNOSIS — E785 Hyperlipidemia, unspecified: Secondary | ICD-10-CM | POA: Diagnosis not present

## 2014-03-13 DIAGNOSIS — Z96642 Presence of left artificial hip joint: Secondary | ICD-10-CM | POA: Diagnosis not present

## 2014-03-13 DIAGNOSIS — I5032 Chronic diastolic (congestive) heart failure: Secondary | ICD-10-CM | POA: Diagnosis not present

## 2014-03-13 DIAGNOSIS — D510 Vitamin B12 deficiency anemia due to intrinsic factor deficiency: Secondary | ICD-10-CM | POA: Diagnosis not present

## 2014-03-13 DIAGNOSIS — Z9181 History of falling: Secondary | ICD-10-CM | POA: Diagnosis not present

## 2014-03-13 DIAGNOSIS — Z85038 Personal history of other malignant neoplasm of large intestine: Secondary | ICD-10-CM | POA: Diagnosis not present

## 2014-03-13 DIAGNOSIS — Z471 Aftercare following joint replacement surgery: Secondary | ICD-10-CM | POA: Diagnosis not present

## 2014-03-13 DIAGNOSIS — Z87440 Personal history of urinary (tract) infections: Secondary | ICD-10-CM | POA: Diagnosis not present

## 2014-03-13 DIAGNOSIS — I129 Hypertensive chronic kidney disease with stage 1 through stage 4 chronic kidney disease, or unspecified chronic kidney disease: Secondary | ICD-10-CM | POA: Diagnosis not present

## 2014-03-14 DIAGNOSIS — N183 Chronic kidney disease, stage 3 (moderate): Secondary | ICD-10-CM | POA: Diagnosis not present

## 2014-03-14 DIAGNOSIS — Z96642 Presence of left artificial hip joint: Secondary | ICD-10-CM | POA: Diagnosis not present

## 2014-03-14 DIAGNOSIS — I129 Hypertensive chronic kidney disease with stage 1 through stage 4 chronic kidney disease, or unspecified chronic kidney disease: Secondary | ICD-10-CM | POA: Diagnosis not present

## 2014-03-14 DIAGNOSIS — E785 Hyperlipidemia, unspecified: Secondary | ICD-10-CM | POA: Diagnosis not present

## 2014-03-14 DIAGNOSIS — Z85038 Personal history of other malignant neoplasm of large intestine: Secondary | ICD-10-CM | POA: Diagnosis not present

## 2014-03-14 DIAGNOSIS — Z471 Aftercare following joint replacement surgery: Secondary | ICD-10-CM | POA: Diagnosis not present

## 2014-03-14 DIAGNOSIS — Z9181 History of falling: Secondary | ICD-10-CM | POA: Diagnosis not present

## 2014-03-14 DIAGNOSIS — D510 Vitamin B12 deficiency anemia due to intrinsic factor deficiency: Secondary | ICD-10-CM | POA: Diagnosis not present

## 2014-03-14 DIAGNOSIS — I5032 Chronic diastolic (congestive) heart failure: Secondary | ICD-10-CM | POA: Diagnosis not present

## 2014-03-14 DIAGNOSIS — Z87440 Personal history of urinary (tract) infections: Secondary | ICD-10-CM | POA: Diagnosis not present

## 2014-03-15 ENCOUNTER — Ambulatory Visit (INDEPENDENT_AMBULATORY_CARE_PROVIDER_SITE_OTHER): Payer: Medicare Other | Admitting: Family Medicine

## 2014-03-15 ENCOUNTER — Encounter: Payer: Self-pay | Admitting: Family Medicine

## 2014-03-15 VITALS — BP 124/66 | HR 76 | Temp 98.1°F | Wt 130.5 lb

## 2014-03-15 DIAGNOSIS — J181 Lobar pneumonia, unspecified organism: Secondary | ICD-10-CM

## 2014-03-15 DIAGNOSIS — I10 Essential (primary) hypertension: Secondary | ICD-10-CM | POA: Diagnosis not present

## 2014-03-15 DIAGNOSIS — J189 Pneumonia, unspecified organism: Secondary | ICD-10-CM | POA: Diagnosis not present

## 2014-03-15 DIAGNOSIS — S72002S Fracture of unspecified part of neck of left femur, sequela: Secondary | ICD-10-CM

## 2014-03-15 DIAGNOSIS — R350 Frequency of micturition: Secondary | ICD-10-CM

## 2014-03-15 NOTE — Progress Notes (Signed)
BP 124/66 mmHg  Pulse 76  Temp(Src) 98.1 F (36.7 C) (Oral)  Wt 130 lb 8 oz (59.194 kg)   CC: hospitalization f/u visit  Subjective:    Patient ID: [name], female    DOB: [birth year], 81 y.o.   MRN: [id number]  HPI: [name] is a 81 y.o. female presenting on 03/15/2014 for Follow-up   Recent hospitalization mid January for L displaced femoral neck fracture s/p successful L hip hemiarthroplsaty 02/01/2014 then subsequent admission to SNF [facility] place). Sounds like she was discharged home on 02/20/2014. Doing well at home. Prior on methocarbamol and hydrocodone as needed and doesn't have significant pain. No longer using pain meds.   Has HHPT and RN. Voiding well. Stooling well. Appetite good.   Blood pressure was low last visit so lisinopril was held.   Dry cough present for >1 mo, without allergic rhinitis or GERD sxs. ACEI was held to see if this would help, did not. VQ scan was low risk for PE - obtained given recent hip fracture and prolonged immobility. Xray showed possible pneumonia - treated with tessalon perls and levaquin course.   Seen 2/13 for UTI by Dr Diona Browner - urine culture obtained and never returned. Did not think there was infection. No abd pain, fevers/chills, dysuria, daytime frequency.   CHEST 2 VIEW COMPARISON: 01/31/2014 FINDINGS: The lungs are hyperinflated likely secondary to COPD. There is left lower lobe hazy airspace disease concerning for pneumonia. There is no pleural effusion or pneumothorax. There is no other focal parenchymal opacity. The heart and mediastinal contours are unremarkable. The osseous structures are unremarkable. IMPRESSION: Hazy left lower lobe airspace disease concerning for pneumonia. Recommend followup radiography in 4-6 weeks, to document complete resolution following adequate medical therapy. If there is not complete resolution, then recommend further evaluation with CT of the chest to exclude underlying  pathology. Electronically Signed  By: Kathreen Devoid  On: 03/01/2014 08:30  NUCLEAR MEDICINE VENTILATION - PERFUSION LUNG SCAN IMPRESSION: 1. Multiple subsegmental matched defects. No ventilation-perfusion mismatches. Very low probability of pulmonary embolism (0-9%) 2. Changes compatible with COPD Electronically Signed  By: Lawrence Santiago M.D.  On: 03/01/2014 11:56  Relevant past medical, surgical, family and social history reviewed and updated as indicated. Interim medical history since our last visit reviewed. Allergies and medications reviewed and updated. Current Outpatient Prescriptions on File Prior to Visit  Medication Sig  . acetaminophen (TYLENOL) 325 MG tablet Take 2 tablets (650 mg total) by mouth every 6 (six) hours as needed for mild pain or moderate pain (or Fever >/= 101).  Marland Kitchen amLODipine (NORVASC) 5 MG tablet TAKE 1 TABLET EVERY EVENING  . beta carotene w/minerals (OCUVITE) tablet Take 1 tablet by mouth daily.  . Cholecalciferol (VITAMIN D3) 1000 UNITS CAPS Take by mouth 2 (two) times daily.    . ferrous sulfate 325 (65 FE) MG tablet Take 325 mg by mouth 2 (two) times daily.    . furosemide (LASIX) 20 MG tablet TAKE 1/2 TABLET EVERY MORNING.  Marland Kitchen levothyroxine (SYNTHROID, LEVOTHROID) 25 MCG tablet TAKE AS DIRECTED ON MONDAY THRU FRIDAY.HOLD ON WEEKENDS.TAKE BEFORE BREAKFAST  . vitamin B-12 (CYANOCOBALAMIN) 1000 MCG tablet Take 1,000 mcg by mouth daily.     No current facility-administered medications on file prior to visit.    Review of Systems Per HPI unless specifically indicated above     Objective:    BP 124/66 mmHg  Pulse 76  Temp(Src) 98.1 F (36.7 C) (Oral)  Wt 130 lb 8  oz (59.194 kg)  Wt Readings from Last 3 Encounters:  03/15/14 130 lb 8 oz (59.194 kg)  03/09/14 131 lb (59.421 kg)  02/25/14 133 lb (60.328 kg)    Physical Exam  Constitutional: She appears well-developed and well-nourished. No distress.  HENT:  Mouth/Throat: Oropharynx is clear  and moist. No oropharyngeal exudate.  Eyes: Conjunctivae and EOM are normal. Pupils are equal, round, and reactive to light. No scleral icterus.  Neck: Normal range of motion. Neck supple.  Cardiovascular: Normal rate, regular rhythm, normal heart sounds and intact distal pulses.   No murmur heard. Pulmonary/Chest: Effort normal. No respiratory distress. She has no wheezes. She has rales (LLL).  Residual congestion LLL  Musculoskeletal: She exhibits no edema.  No pain with int/ext rotation at hip  Lymphadenopathy:    She has no cervical adenopathy.  Skin: Skin is warm and dry. No rash noted.  Psychiatric: She has a normal mood and affect.  Nursing note and vitals reviewed.     Assessment & Plan:   Problem List Items Addressed This Visit    Left lower lobe pneumonia    By Xray 03/01/2014 s/p levaquin course, cough has resolved. Residual LLL lung congestion. Will repeat xray mid March.      Relevant Orders   DG Chest 2 View   Left displaced femoral neck fracture - Primary    Has recovered remarkably well, continues to have [place] come out to house. Now only on tylenol sparing prn pain.      RESOLVED: Increased urinary frequency    UCx never returned. Regardless, sxs have largely resolved. Will monitor for now.      Essential hypertension    bp better off lisinopril. Continue to monitor on only amlodipine and lasix.      Relevant Medications   aspirin 81 MG tablet       Follow up plan: Return in about 4 months (around 07/14/2014), or if symptoms worsen or fail to improve, for follow up visit.

## 2014-03-15 NOTE — Patient Instructions (Addendum)
Return mid march for follow up xray to make sure pneumonia has cleared up. Blood work next time.  Good to see you today, call us with questions.

## 2014-03-15 NOTE — Assessment & Plan Note (Signed)
By Odette Horns 03/01/2014 s/p levaquin course, cough has resolved. Residual LLL lung congestion. Will repeat xray mid March.

## 2014-03-15 NOTE — Assessment & Plan Note (Signed)
bp better off lisinopril. Continue to monitor on only amlodipine and lasix.

## 2014-03-15 NOTE — Assessment & Plan Note (Signed)
Has recovered remarkably well, continues to have [place] come out to house. Now only on tylenol sparing prn pain.

## 2014-03-15 NOTE — Assessment & Plan Note (Signed)
UCx never returned. Regardless, sxs have largely resolved. Will monitor for now.

## 2014-03-15 NOTE — Progress Notes (Signed)
Pre visit review using our clinic review tool, if applicable. No additional management support is needed unless otherwise documented below in the visit note. 

## 2014-03-22 DIAGNOSIS — E785 Hyperlipidemia, unspecified: Secondary | ICD-10-CM | POA: Diagnosis not present

## 2014-03-22 DIAGNOSIS — Z85038 Personal history of other malignant neoplasm of large intestine: Secondary | ICD-10-CM | POA: Diagnosis not present

## 2014-03-22 DIAGNOSIS — Z87440 Personal history of urinary (tract) infections: Secondary | ICD-10-CM | POA: Diagnosis not present

## 2014-03-22 DIAGNOSIS — Z471 Aftercare following joint replacement surgery: Secondary | ICD-10-CM | POA: Diagnosis not present

## 2014-03-22 DIAGNOSIS — D510 Vitamin B12 deficiency anemia due to intrinsic factor deficiency: Secondary | ICD-10-CM | POA: Diagnosis not present

## 2014-03-22 DIAGNOSIS — I5032 Chronic diastolic (congestive) heart failure: Secondary | ICD-10-CM | POA: Diagnosis not present

## 2014-03-22 DIAGNOSIS — I129 Hypertensive chronic kidney disease with stage 1 through stage 4 chronic kidney disease, or unspecified chronic kidney disease: Secondary | ICD-10-CM | POA: Diagnosis not present

## 2014-03-22 DIAGNOSIS — N183 Chronic kidney disease, stage 3 (moderate): Secondary | ICD-10-CM | POA: Diagnosis not present

## 2014-03-22 DIAGNOSIS — Z96642 Presence of left artificial hip joint: Secondary | ICD-10-CM | POA: Diagnosis not present

## 2014-03-22 DIAGNOSIS — Z9181 History of falling: Secondary | ICD-10-CM | POA: Diagnosis not present

## 2014-03-27 DIAGNOSIS — Z9181 History of falling: Secondary | ICD-10-CM | POA: Diagnosis not present

## 2014-03-27 DIAGNOSIS — N183 Chronic kidney disease, stage 3 (moderate): Secondary | ICD-10-CM | POA: Diagnosis not present

## 2014-03-27 DIAGNOSIS — Z96642 Presence of left artificial hip joint: Secondary | ICD-10-CM | POA: Diagnosis not present

## 2014-03-27 DIAGNOSIS — D510 Vitamin B12 deficiency anemia due to intrinsic factor deficiency: Secondary | ICD-10-CM | POA: Diagnosis not present

## 2014-03-27 DIAGNOSIS — E785 Hyperlipidemia, unspecified: Secondary | ICD-10-CM | POA: Diagnosis not present

## 2014-03-27 DIAGNOSIS — Z87440 Personal history of urinary (tract) infections: Secondary | ICD-10-CM | POA: Diagnosis not present

## 2014-03-27 DIAGNOSIS — Z85038 Personal history of other malignant neoplasm of large intestine: Secondary | ICD-10-CM | POA: Diagnosis not present

## 2014-03-27 DIAGNOSIS — I129 Hypertensive chronic kidney disease with stage 1 through stage 4 chronic kidney disease, or unspecified chronic kidney disease: Secondary | ICD-10-CM | POA: Diagnosis not present

## 2014-03-27 DIAGNOSIS — I5032 Chronic diastolic (congestive) heart failure: Secondary | ICD-10-CM | POA: Diagnosis not present

## 2014-03-27 DIAGNOSIS — Z471 Aftercare following joint replacement surgery: Secondary | ICD-10-CM | POA: Diagnosis not present

## 2014-04-10 ENCOUNTER — Ambulatory Visit: Payer: Medicare Other | Admitting: Family Medicine

## 2014-04-10 ENCOUNTER — Ambulatory Visit (INDEPENDENT_AMBULATORY_CARE_PROVIDER_SITE_OTHER)
Admission: RE | Admit: 2014-04-10 | Discharge: 2014-04-10 | Disposition: A | Discharge status: Home / Self Care | Payer: Medicare Other | Source: Ambulatory Visit | Attending: Family Medicine | Admitting: Family Medicine

## 2014-04-10 DIAGNOSIS — J189 Pneumonia, unspecified organism: Secondary | ICD-10-CM

## 2014-04-10 DIAGNOSIS — J181 Lobar pneumonia, unspecified organism: Principal | ICD-10-CM

## 2014-04-12 ENCOUNTER — Telehealth: Payer: Self-pay | Admitting: Family Medicine

## 2014-04-12 NOTE — Telephone Encounter (Signed)
Spoke with patient's daughter.

## 2014-04-12 NOTE — Telephone Encounter (Signed)
Patient's daughter returned your call about patient's chest x-ray.

## 2014-04-20 DIAGNOSIS — Z96642 Presence of left artificial hip joint: Secondary | ICD-10-CM | POA: Diagnosis not present

## 2014-05-17 ENCOUNTER — Encounter: Payer: Self-pay | Admitting: Gastroenterology

## 2014-05-21 DIAGNOSIS — Z96642 Presence of left artificial hip joint: Secondary | ICD-10-CM | POA: Diagnosis not present

## 2014-06-20 DIAGNOSIS — Z96642 Presence of left artificial hip joint: Secondary | ICD-10-CM | POA: Diagnosis not present

## 2014-07-09 ENCOUNTER — Ambulatory Visit (INDEPENDENT_AMBULATORY_CARE_PROVIDER_SITE_OTHER)
Admission: RE | Admit: 2014-07-09 | Discharge: 2014-07-09 | Disposition: A | Discharge status: Home / Self Care | Payer: Medicare Other | Source: Ambulatory Visit | Attending: Family Medicine | Admitting: Family Medicine

## 2014-07-09 ENCOUNTER — Encounter: Payer: Self-pay | Admitting: Family Medicine

## 2014-07-09 ENCOUNTER — Ambulatory Visit (INDEPENDENT_AMBULATORY_CARE_PROVIDER_SITE_OTHER): Payer: Medicare Other | Admitting: Family Medicine

## 2014-07-09 ENCOUNTER — Ambulatory Visit: Payer: Medicare Other | Admitting: Family Medicine

## 2014-07-09 VITALS — BP 130/60 | HR 76 | Temp 97.4°F | Wt 126.5 lb

## 2014-07-09 DIAGNOSIS — R251 Tremor, unspecified: Secondary | ICD-10-CM | POA: Insufficient documentation

## 2014-07-09 DIAGNOSIS — I10 Essential (primary) hypertension: Secondary | ICD-10-CM

## 2014-07-09 DIAGNOSIS — R918 Other nonspecific abnormal finding of lung field: Secondary | ICD-10-CM | POA: Diagnosis not present

## 2014-07-09 DIAGNOSIS — N183 Chronic kidney disease, stage 3 unspecified: Secondary | ICD-10-CM

## 2014-07-09 DIAGNOSIS — R9389 Abnormal findings on diagnostic imaging of other specified body structures: Secondary | ICD-10-CM

## 2014-07-09 DIAGNOSIS — R938 Abnormal findings on diagnostic imaging of other specified body structures: Secondary | ICD-10-CM

## 2014-07-09 LAB — CBC WITH DIFFERENTIAL/PLATELET
BASOS ABS: 0 10*3/uL (ref 0.0–0.1)
Basophils Relative: 0.6 % (ref 0.0–3.0)
EOS ABS: 0.2 10*3/uL (ref 0.0–0.7)
Eosinophils Relative: 2.4 % (ref 0.0–5.0)
HCT: 38.6 % (ref 36.0–46.0)
Hemoglobin: 12.9 g/dL (ref 12.0–15.0)
LYMPHS ABS: 2.6 10*3/uL (ref 0.7–4.0)
LYMPHS PCT: 32.3 % (ref 12.0–46.0)
MCHC: 33.3 g/dL (ref 30.0–36.0)
MCV: 91 fl (ref 78.0–100.0)
MONO ABS: 0.5 10*3/uL (ref 0.1–1.0)
Monocytes Relative: 6.4 % (ref 3.0–12.0)
NEUTROS ABS: 4.8 10*3/uL (ref 1.4–7.7)
Neutrophils Relative %: 58.3 % (ref 43.0–77.0)
Platelets: 148 10*3/uL — ABNORMAL LOW (ref 150.0–400.0)
RBC: 4.24 Mil/uL (ref 3.87–5.11)
RDW: 14.1 % (ref 11.5–15.5)
WBC: 8.2 10*3/uL (ref 4.0–10.5)

## 2014-07-09 LAB — RENAL FUNCTION PANEL
Albumin: 4.1 g/dL (ref 3.5–5.2)
BUN: 29 mg/dL — ABNORMAL HIGH (ref 6–23)
CO2: 30 mEq/L (ref 19–32)
CREATININE: 1.14 mg/dL (ref 0.40–1.20)
Calcium: 10.1 mg/dL (ref 8.4–10.5)
Chloride: 104 mEq/L (ref 96–112)
GFR: 47.42 mL/min — ABNORMAL LOW (ref >60.00)
GLUCOSE: 97 mg/dL (ref 70–99)
PHOSPHORUS: 3.4 mg/dL (ref 2.3–4.6)
POTASSIUM: 4.3 meq/L (ref 3.5–5.1)
SODIUM: 140 meq/L (ref 135–145)

## 2014-07-09 LAB — TSH: TSH: 5.11 u[IU]/mL — AB (ref 0.35–4.50)

## 2014-07-09 NOTE — Progress Notes (Signed)
BP 130/60 mmHg  Pulse 76  Temp(Src) 97.4 F (36.3 C) (Oral)  Wt 126 lb 8 oz (57.38 kg)   CC: f/u visit  Subjective:    Patient ID: [name], female    DOB: [birth year], 81 y.o.   MRN: [id number]  HPI: [name] is a 81 y.o. female presenting on 07/09/2014 for Follow-up   6 mo f/u visit. In interim, had L displaced femoral neck fracture s/p successful L hip hemiarthroplasty 01/2014. Then had LLL PNA 02/2014 s/p levaquin treatment. Rpt xray cleared LLL opacity but new RML opacity but pt denied any sxs.   Last few days noticing some new intermittent shaking in R hand affecting ability to feed herself. Denies numbness or pain of R hand. No neck or shoulder pain.   No new cough or fevers.  Comes in today using walker. No new unsteadiness or imbalance or recent falls.   Relevant past medical, surgical, family and social history reviewed and updated as indicated. Interim medical history since our last visit reviewed. Allergies and medications reviewed and updated. Current Outpatient Prescriptions on File Prior to Visit  Medication Sig  . acetaminophen (TYLENOL) 325 MG tablet Take 2 tablets (650 mg total) by mouth every 6 (six) hours as needed for mild pain or moderate pain (or Fever >/= 101).  Marland Kitchen amLODipine (NORVASC) 5 MG tablet TAKE 1 TABLET EVERY EVENING  . aspirin 81 MG tablet Take 81 mg by mouth daily.  . beta carotene w/minerals (OCUVITE) tablet Take 1 tablet by mouth daily.  . Cholecalciferol (VITAMIN D3) 1000 UNITS CAPS Take by mouth 2 (two) times daily.    . ferrous sulfate 325 (65 FE) MG tablet Take 325 mg by mouth 2 (two) times daily.    . furosemide (LASIX) 20 MG tablet TAKE 1/2 TABLET EVERY MORNING.  Marland Kitchen levothyroxine (SYNTHROID, LEVOTHROID) 25 MCG tablet TAKE AS DIRECTED ON MONDAY THRU FRIDAY.HOLD ON WEEKENDS.TAKE BEFORE BREAKFAST  . vitamin B-12 (CYANOCOBALAMIN) 1000 MCG tablet Take 1,000 mcg by mouth daily.     No current facility-administered medications on file  prior to visit.    Review of Systems Per HPI unless specifically indicated above     Objective:    BP 130/60 mmHg  Pulse 76  Temp(Src) 97.4 F (36.3 C) (Oral)  Wt 126 lb 8 oz (57.38 kg)  Wt Readings from Last 3 Encounters:  07/09/14 126 lb 8 oz (57.38 kg)  03/15/14 130 lb 8 oz (59.194 kg)  03/09/14 131 lb (59.421 kg)   Body mass index is 21.7 kg/(m^2).  Physical Exam  Constitutional: She appears well-developed and well-nourished. No distress.  Cardiovascular: Normal rate, regular rhythm, normal heart sounds and intact distal pulses.   No murmur heard. Pulmonary/Chest: Effort normal and breath sounds normal. No respiratory distress. She has no wheezes. She has no rales.  Musculoskeletal: She exhibits no edema.  Skin: Skin is warm and dry. No rash noted.  Psychiatric: She has a normal mood and affect.  Nursing note and vitals reviewed.     Assessment & Plan:  Recheck CXR today to f/u abnormal film 03/2014 with new lingular or RML opacity on lateral view. Pt without respiratory sxs.  Problem List Items Addressed This Visit    CKD (chronic kidney disease), stage III    Chronic, stable. Stays well hydrated. Check renal panel today.      Relevant Orders   Renal function panel   CBC with Differential/Platelet   TSH   Essential hypertension -  Primary    Chronic, stable. Continue current regimen of amlodipine and lasix 10mg .      Relevant Orders   TSH   Tremor of right hand    Check TSH today.      Relevant Orders   TSH    Other Visit Diagnoses    Abnormal chest x-ray        Relevant Orders    DG Chest 2 View        Follow up plan: Return in about 6 months (around 01/08/2015), or as needed, for medicare wellness visit.

## 2014-07-09 NOTE — Assessment & Plan Note (Signed)
Check TSH today

## 2014-07-09 NOTE — Patient Instructions (Signed)
labwork today. Recheck xray today. Return in 6 months for medicare wellness visit

## 2014-07-09 NOTE — Assessment & Plan Note (Addendum)
Chronic, stable. Stays well hydrated. Check renal panel today.

## 2014-07-09 NOTE — Progress Notes (Signed)
Pre visit review using our clinic review tool, if applicable. No additional management support is needed unless otherwise documented below in the visit note. 

## 2014-07-09 NOTE — Assessment & Plan Note (Signed)
Chronic, stable. Continue current regimen of amlodipine and lasix 10mg .

## 2014-07-10 ENCOUNTER — Encounter: Payer: Self-pay | Admitting: *Deleted

## 2014-07-11 ENCOUNTER — Other Ambulatory Visit: Payer: Self-pay | Admitting: Family Medicine

## 2014-07-11 MED ORDER — LEVOTHYROXINE SODIUM 25 MCG PO TABS: ORAL_TABLET | ORAL | Status: DC

## 2014-07-15 ENCOUNTER — Ambulatory Visit: Payer: Medicare Other | Admitting: Family Medicine

## 2014-07-21 DIAGNOSIS — Z96642 Presence of left artificial hip joint: Secondary | ICD-10-CM | POA: Diagnosis not present

## 2014-07-30 ENCOUNTER — Other Ambulatory Visit: Payer: Self-pay | Admitting: Family Medicine

## 2014-08-20 DIAGNOSIS — Z96642 Presence of left artificial hip joint: Secondary | ICD-10-CM | POA: Diagnosis not present

## 2014-09-20 DIAGNOSIS — Z96642 Presence of left artificial hip joint: Secondary | ICD-10-CM | POA: Diagnosis not present

## 2014-10-21 DIAGNOSIS — Z96642 Presence of left artificial hip joint: Secondary | ICD-10-CM | POA: Diagnosis not present

## 2014-11-04 ENCOUNTER — Telehealth: Payer: Self-pay | Admitting: *Deleted

## 2014-11-04 ENCOUNTER — Emergency Department (HOSPITAL_COMMUNITY)
Admission: EM | Admit: 2014-11-04 | Discharge: 2014-11-04 | Disposition: A | Discharge status: Home / Self Care | Payer: Medicare Other | Attending: Emergency Medicine | Admitting: Emergency Medicine

## 2014-11-04 ENCOUNTER — Encounter (HOSPITAL_COMMUNITY): Payer: Self-pay | Admitting: Cardiology

## 2014-11-04 ENCOUNTER — Telehealth: Payer: Self-pay | Admitting: Family Medicine

## 2014-11-04 DIAGNOSIS — Z7982 Long term (current) use of aspirin: Secondary | ICD-10-CM | POA: Insufficient documentation

## 2014-11-04 DIAGNOSIS — I129 Hypertensive chronic kidney disease with stage 1 through stage 4 chronic kidney disease, or unspecified chronic kidney disease: Secondary | ICD-10-CM | POA: Diagnosis not present

## 2014-11-04 DIAGNOSIS — Z85038 Personal history of other malignant neoplasm of large intestine: Secondary | ICD-10-CM | POA: Insufficient documentation

## 2014-11-04 DIAGNOSIS — I5030 Unspecified diastolic (congestive) heart failure: Secondary | ICD-10-CM | POA: Insufficient documentation

## 2014-11-04 DIAGNOSIS — N183 Chronic kidney disease, stage 3 (moderate): Secondary | ICD-10-CM | POA: Diagnosis not present

## 2014-11-04 DIAGNOSIS — J449 Chronic obstructive pulmonary disease, unspecified: Secondary | ICD-10-CM | POA: Insufficient documentation

## 2014-11-04 DIAGNOSIS — R079 Chest pain, unspecified: Secondary | ICD-10-CM | POA: Diagnosis not present

## 2014-11-04 DIAGNOSIS — Z79899 Other long term (current) drug therapy: Secondary | ICD-10-CM | POA: Diagnosis not present

## 2014-11-04 DIAGNOSIS — E039 Hypothyroidism, unspecified: Secondary | ICD-10-CM | POA: Diagnosis not present

## 2014-11-04 DIAGNOSIS — D649 Anemia, unspecified: Secondary | ICD-10-CM | POA: Insufficient documentation

## 2014-11-04 DIAGNOSIS — R0789 Other chest pain: Secondary | ICD-10-CM | POA: Diagnosis not present

## 2014-11-04 LAB — COMPREHENSIVE METABOLIC PANEL
ALBUMIN: 3 g/dL — AB (ref 3.5–5.0)
ALT: 13 U/L — ABNORMAL LOW (ref 14–54)
ANION GAP: 10 (ref 5–15)
AST: 22 U/L (ref 15–41)
Alkaline Phosphatase: 46 U/L (ref 38–126)
BUN: 22 mg/dL — AB (ref 6–20)
CHLORIDE: 104 mmol/L (ref 101–111)
CO2: 27 mmol/L (ref 22–32)
Calcium: 9.3 mg/dL (ref 8.9–10.3)
Creatinine, Ser: 1.19 mg/dL — ABNORMAL HIGH (ref 0.44–1.00)
GFR calc Af Amer: 45 mL/min — ABNORMAL LOW (ref >60)
GFR calc non Af Amer: 39 mL/min — ABNORMAL LOW (ref >60)
GLUCOSE: 136 mg/dL — AB (ref 65–99)
POTASSIUM: 3.8 mmol/L (ref 3.5–5.1)
SODIUM: 141 mmol/L (ref 135–145)
TOTAL PROTEIN: 5.6 g/dL — AB (ref 6.5–8.1)
Total Bilirubin: 0.9 mg/dL (ref 0.3–1.2)

## 2014-11-04 LAB — CBC
HEMATOCRIT: 37.8 % (ref 36.0–46.0)
Hemoglobin: 12.5 g/dL (ref 12.0–15.0)
MCH: 31.3 pg (ref 26.0–34.0)
MCHC: 33.1 g/dL (ref 30.0–36.0)
MCV: 94.5 fL (ref 78.0–100.0)
PLATELETS: 149 10*3/uL — AB (ref 150–400)
RBC: 4 MIL/uL (ref 3.87–5.11)
RDW: 12.8 % (ref 11.5–15.5)
WBC: 11.5 10*3/uL — AB (ref 4.0–10.5)

## 2014-11-04 LAB — I-STAT TROPONIN, ED
TROPONIN I, POC: 0 ng/mL (ref 0.00–0.08)
Troponin i, poc: 0.01 ng/mL (ref 0.00–0.08)

## 2014-11-04 NOTE — Telephone Encounter (Signed)
Patient's daughter, Loleta Chance [facility] signed), called to verify instructions on 81 mg aspirin.  Per med list, aspirin should be taken once daily.  Detail VM left on home number per DPR.

## 2014-11-04 NOTE — Telephone Encounter (Signed)
Patient at [name] for evaluation.

## 2014-11-04 NOTE — Telephone Encounter (Signed)
Agree 

## 2014-11-04 NOTE — ED Notes (Signed)
EDP at the bedside.  ?

## 2014-11-04 NOTE — Discharge Instructions (Signed)
Nonspecific Chest Pain  °Chest pain can be caused by many different conditions. There is always a chance that your pain could be related to something serious, such as a heart attack or a blood clot in your lungs. Chest pain can also be caused by conditions that are not life-threatening. If you have chest pain, it is very important to follow up with your health care provider. °CAUSES  °Chest pain can be caused by: °· Heartburn. °· Pneumonia or bronchitis. °· Anxiety or stress. °· Inflammation around your heart (pericarditis) or lung (pleuritis or pleurisy). °· A blood clot in your lung. °· A collapsed lung (pneumothorax). It can develop suddenly on its own (spontaneous pneumothorax) or from trauma to the chest. °· Shingles infection (varicella-zoster virus). °· Heart attack. °· Damage to the bones, muscles, and cartilage that make up your chest wall. This can include: °¨ Bruised bones due to injury. °¨ Strained muscles or cartilage due to frequent or repeated coughing or overwork. °¨ Fracture to one or more ribs. °¨ Sore cartilage due to inflammation (costochondritis). °RISK FACTORS  °Risk factors for chest pain may include: °· Activities that increase your risk for trauma or injury to your chest. °· Respiratory infections or conditions that cause frequent coughing. °· Medical conditions or overeating that can cause heartburn. °· Heart disease or family history of heart disease. °· Conditions or health behaviors that increase your risk of developing a blood clot. °· Having had chicken pox (varicella zoster). °SIGNS AND SYMPTOMS °Chest pain can feel like: °· Burning or tingling on the surface of your chest or deep in your chest. °· Crushing, pressure, aching, or squeezing pain. °· Dull or sharp pain that is worse when you move, cough, or take a deep breath. °· Pain that is also felt in your back, neck, shoulder, or arm, or pain that spreads to any of these areas. °Your chest pain may come and go, or it may stay  constant. °DIAGNOSIS °Lab tests or other studies may be needed to find the cause of your pain. Your health care provider may have you take a test called an ambulatory ECG (electrocardiogram). An ECG records your heartbeat patterns at the time the test is performed. You may also have other tests, such as: °· Transthoracic echocardiogram (TTE). During echocardiography, sound waves are used to create a picture of all of the heart structures and to look at how blood flows through your heart. °· Transesophageal echocardiogram (TEE). This is a more advanced imaging test that obtains images from inside your body. It allows your health care provider to see your heart in finer detail. °· Cardiac monitoring. This allows your health care provider to monitor your heart rate and rhythm in real time. °· Holter monitor. This is a portable device that records your heartbeat and can help to diagnose abnormal heartbeats. It allows your health care provider to track your heart activity for several days, if needed. °· Stress tests. These can be done through exercise or by taking medicine that makes your heart beat more quickly. °· Blood tests. °· Imaging tests. °TREATMENT  °Your treatment depends on what is causing your chest pain. Treatment may include: °· Medicines. These may include: °¨ Acid blockers for heartburn. °¨ Anti-inflammatory medicine. °¨ Pain medicine for inflammatory conditions. °¨ Antibiotic medicine, if an infection is present. °¨ Medicines to dissolve blood clots. °¨ Medicines to treat coronary artery disease. °· Supportive care for conditions that do not require medicines. This may include: °¨ Resting. °¨ Applying heat   or cold packs to injured areas. °¨ Limiting activities until pain decreases. °HOME CARE INSTRUCTIONS °· If you were prescribed an antibiotic medicine, finish it all even if you start to feel better. °· Avoid any activities that bring on chest pain. °· Do not use any tobacco products, including  cigarettes, chewing tobacco, or electronic cigarettes. If you need help quitting, ask your health care provider. °· Do not drink alcohol. °· Take medicines only as directed by your health care provider. °· Keep all follow-up visits as directed by your health care provider. This is important. This includes any further testing if your chest pain does not go away. °· If heartburn is the cause for your chest pain, you may be told to keep your head raised (elevated) while sleeping. This reduces the chance that acid will go from your stomach into your esophagus. °· Make lifestyle changes as directed by your health care provider. These may include: °¨ Getting regular exercise. Ask your health care provider to suggest some activities that are safe for you. °¨ Eating a heart-healthy diet. A registered dietitian can help you to learn healthy eating options. °¨ Maintaining a healthy weight. °¨ Managing diabetes, if necessary. °¨ Reducing stress. °SEEK MEDICAL CARE IF: °· Your chest pain does not go away after treatment. °· You have a rash with blisters on your chest. °· You have a fever. °SEEK IMMEDIATE MEDICAL CARE IF:  °· Your chest pain is worse. °· You have an increasing cough, or you cough up blood. °· You have severe abdominal pain. °· You have severe weakness. °· You faint. °· You have chills. °· You have sudden, unexplained chest discomfort. °· You have sudden, unexplained discomfort in your arms, back, neck, or jaw. °· You have shortness of breath at any time. °· You suddenly start to sweat, or your skin gets clammy. °· You feel nauseous or you vomit. °· You suddenly feel light-headed or dizzy. °· Your heart begins to beat quickly, or it feels like it is skipping beats. °These symptoms may represent a serious problem that is an emergency. Do not wait to see if the symptoms will go away. Get medical help right away. Call your local emergency services (911 in the [place] Do not drive yourself to the hospital. °  °This  information is not intended to replace advice given to you by your health care provider. Make sure you discuss any questions you have with your health care provider. °  °Document Released: 10/21/2004 Document Revised: 02/01/2014 Document Reviewed: 08/17/2013 °Elsevier Interactive Patient Education ©2016 [employer] ° °

## 2014-11-04 NOTE — Telephone Encounter (Signed)
°  Patient Name: [name]; [name]  DOB: 04/14/1920    Initial Comment caller states her mother is holding the left side of her chest - is c/o pain   Nurse Assessment  Nurse: Julien Girt, RN, Almyra Free Date/Time Eilene Ghazi Time): 11/04/2014 9:52:24 AM  Confirm and document reason for call. If symptomatic, describe symptoms. ---caller states her mother is holding the left side of her chest and is having a "little trouble breathing".  Has the patient traveled out of the country within the last 30 days? ---Not Applicable  Does the patient have any new or worsening symptoms? ---Yes  Will a triage be completed? ---Yes  Related visit to physician within the last 2 weeks? ---No  Does the PT have any chronic conditions? (i.e. diabetes, asthma, etc.) ---Yes  List chronic conditions. ---Hx CHF, Hx fx hip/02/2014     Guidelines    Guideline Title Affirmed Question Affirmed Notes  Chest Pain [1] Chest pain lasts > 5 minutes AND [2] history of heart disease (i.e., heart attack, bypass surgery, angina, angioplasty, CHF; not just a heart murmur)    Final Disposition User   Call EMS 911 Now [place] Hole, Almyra Free    Disagree/Comply: Comply

## 2014-11-04 NOTE — ED Notes (Signed)
Ordered heart healthy tray for lunch

## 2014-11-04 NOTE — ED Notes (Signed)
Pt to bathroom via wheelchair without any problems.  Pt st's she is ready to go home

## 2014-11-04 NOTE — ED Provider Notes (Signed)
CSN: [id number]     Arrival date & time 11/04/14  1057 History   First MD Initiated Contact with Patient 11/04/14 1109     Chief Complaint  Patient presents with  . Chest Pain      The history is provided by the patient and a relative.   Patient presents emergency department complaining of chest pain since awakening this morning.  She was given aspirin and nitroglycerin in route by EMS and currently has no pain.  She reports some discomfort with palpation of her left anterior chest.  No trauma or injury.  No recent cough or congestion.  Denies shortness of breath.  No prior history of coronary disease.  No prior history of heart catheterization.  Currently she is without any symptoms.  She reports that the discomfort lasted approximately an hour was described as a burning and uncomfortable sensation in her anterior chest without radiation.    Past Medical History  Diagnosis Date  . Hypertension 10/1998  . Hyperlipidemia 12/2002  . Diastolic CHF ([place])   . Vaginal delivery     x4, at home  . Anemia 12/30-01/25/2006    [facility]- CHF- started lasix  . History of colon cancer     s/p partial colectomy  . History of ETT 03/15/2006    adenosine myoview low risk  . Hypothyroidism   . Microhematuria 03/2013    watchful waiting ([place])  . CKD (chronic kidney disease), stage III 03/04/2010  . COPD (chronic obstructive pulmonary disease) ([facility]) 02/2014    by CXR and VQ scan   Past Surgical History  Procedure Laterality Date  . Cholecystectomy  1984  . Partial colectomy  03/25/2006    right  . Esophagogastroduodenoscopy      B9 polyp H.Pylori bx- "dilated" polyps  . History of abd  02/25/2006    bilat renal cysts, no mets  . Ct head limited w/o cm  02/2011    no acute process.  Mild to moderate cortical volume loss and scattered small vessel ischemic microangiopathy  . Colonoscopy  03/2010    1 tubular adenoma, int hemorrhoids, f/u prn [name]  . Hemiarthroplasty hip Left 01/2014    s/p  fem fracture after fall  . Hip arthroplasty Left 02/01/2014    Procedure: ARTHROPLASTY BIPOLAR HIP;  Surgeon: [name], MD;  Location: [facility];  Service: Orthopedics;  Laterality: Left;   Family History  Problem Relation Age of Onset  . Stroke Sister   . Diabetes Sister   . Hypertension Sister   . Stroke Mother   . Alcohol abuse Brother    Social History  Substance Use Topics  . Smoking status: Never Smoker   . Smokeless tobacco: Never Used  . Alcohol Use: No   OB History    No data available     Review of Systems  All other systems reviewed and are negative.     Allergies  Review of patient's allergies indicates no known allergies.  Home Medications   Prior to Admission medications   Medication Sig Start Date End Date Taking? Authorizing Provider  acetaminophen (TYLENOL) 325 MG tablet Take 2 tablets (650 mg total) by mouth every 6 (six) hours as needed for mild pain or moderate pain (or Fever >/= 101). 02/04/14   Arlee Muslim, PA-C  amLODipine (NORVASC) 5 MG tablet TAKE 1 TABLET EVERY EVENING 01/03/14   Ria Bush, MD  aspirin 81 MG tablet Take 81 mg by mouth daily.    Historical Provider, MD  beta carotene w/minerals (OCUVITE) tablet Take 1 tablet by mouth daily.    Historical Provider, MD  Cholecalciferol (VITAMIN D3) 1000 UNITS CAPS Take by mouth 2 (two) times daily.      Historical Provider, MD  ferrous sulfate 325 (65 FE) MG tablet Take 325 mg by mouth 2 (two) times daily.      Historical Provider, MD  furosemide (LASIX) 20 MG tablet TAKE 1/2 TABLET EVERY MORNING. 12/03/13   Ria Bush, MD  levothyroxine (SYNTHROID, LEVOTHROID) 25 MCG tablet Take one tablet daily 07/11/14   Ria Bush, MD  levothyroxine (SYNTHROID, LEVOTHROID) 25 MCG tablet Take 1 tablet (25 mcg total) by mouth daily before breakfast. 07/30/14   Ria Bush, MD  vitamin B-12 (CYANOCOBALAMIN) 1000 MCG tablet Take 1,000 mcg by mouth daily.      Historical Provider, MD   BP  109/48 mmHg  Pulse 84  Temp(Src) 97.8 F (36.6 C) (Oral)  Resp 26  Wt 126 lb (57.153 kg)  SpO2 94% Physical Exam  Constitutional: She is oriented to person, place, and time. She appears well-developed and well-nourished. No distress.  HENT:  Head: Normocephalic and atraumatic.  Eyes: EOM are normal.  Neck: Normal range of motion.  Cardiovascular: Normal rate, regular rhythm and normal heart sounds.   Pulmonary/Chest: Effort normal and breath sounds normal.  Mild anterior chest wall tenderness without crepitus or bruising noted.  Abdominal: Soft. She exhibits no distension. There is no tenderness.  Musculoskeletal: Normal range of motion.  Neurological: She is alert and oriented to person, place, and time.  Skin: Skin is warm and dry.  Psychiatric: She has a normal mood and affect. Judgment normal.  Nursing note and vitals reviewed.   ED Course  Procedures (including critical care time) Labs Review Labs Reviewed  CBC - Abnormal; Notable for the following:    WBC 11.5 (*)    Platelets 149 (*)    All other components within normal limits  COMPREHENSIVE METABOLIC PANEL  I-STAT TROPOININ, ED    Imaging Review No results found. I have personally reviewed and evaluated these images and lab results as part of my medical decision-making.   EKG Interpretation   Date/Time:  Monday November 04 2014 11:04:20 EDT Ventricular Rate:  89 PR Interval:  182 QRS Duration: 92 QT Interval:  362 QTC Calculation: 440 R Axis:   68 Text Interpretation:  Sinus rhythm Consider left ventricular hypertrophy  nonspecific interior ST segments as compared to prior some PR depression  Confirmed by Vallery Mcdade  MD, Arthuro Canelo ([id number]) on 11/04/2014 11:15:11 AM      MDM   Final diagnoses:  Chest pain, unspecified chest pain type    EKG and labs without significant abnormality.  Troponin is negative 2.  No respiratory symptoms.  Breath sounds are normal bilaterally.  Discharge home in good condition.   Primary care follow-up.  Much of this with a burning sensation in her anterior chest likely represents gastroesophageal reflux disease.  She understands to return to the ER for new or worsening symptoms.  Primary care follow-up    Jola Schmidt, MD 11/04/14 1705

## 2014-11-04 NOTE — ED Notes (Signed)
Pt to department via EMS from home- pt reports chest pain that started after she woke up this morning. Pt denies any SOb or n/v with the pain. PVCs noted in EKG and elevation in v3. Given 324 ASA and 1 nitro. Bp-116/80. Pain 0/10 on arrival

## 2014-11-07 ENCOUNTER — Ambulatory Visit (INDEPENDENT_AMBULATORY_CARE_PROVIDER_SITE_OTHER): Payer: Medicare Other | Admitting: Family Medicine

## 2014-11-07 ENCOUNTER — Encounter: Payer: Self-pay | Admitting: Family Medicine

## 2014-11-07 VITALS — BP 118/64 | HR 88 | Temp 97.9°F | Wt 127.5 lb

## 2014-11-07 DIAGNOSIS — Z23 Encounter for immunization: Secondary | ICD-10-CM

## 2014-11-07 DIAGNOSIS — R079 Chest pain, unspecified: Secondary | ICD-10-CM

## 2014-11-07 NOTE — Patient Instructions (Addendum)
Flu shot today.  I think you are doing well.  This could be heartburn related chest pain - take prilosec 20mg  daily for 2-3 weeks then may switch to as needed zantac.  Update me if symptoms return.

## 2014-11-07 NOTE — Progress Notes (Signed)
BP 118/64 mmHg  Pulse 88  Temp(Src) 97.9 F (36.6 C) (Oral)  Wt 127 lb 8 oz (57.834 kg)   CC: ER f/u visit  Subjective:    Patient ID: [name], female    DOB: November 22, 1920, 81 y.o.   MRN: [id number]  HPI: YESENIA LOCURTO is a 81 y.o. female presenting on 11/07/2014 for Follow-up   Seen at ER on 11/04/2014 with left sided abdominal pain that started acutely that morning. Workup included normal cardiac enzyme x2, WBC 11.5. CXR not obtained. She describes intermittent pressure mid chest with dizziness that was not associated with dyspnea, nausea, or GERD sxs of acid reflux or burning or indigestion - attributed to GERD and omeprazole was started.   Since she's been home and on med, no further chest discomfort episodes.   Relevant past medical, surgical, family and social history reviewed and updated as indicated. Interim medical history since our last visit reviewed. Allergies and medications reviewed and updated. Current Outpatient Prescriptions on File Prior to Visit  Medication Sig  . acetaminophen (TYLENOL) 325 MG tablet Take 2 tablets (650 mg total) by mouth every 6 (six) hours as needed for mild pain or moderate pain (or Fever >/= 101).  Marland Kitchen amLODipine (NORVASC) 5 MG tablet TAKE 1 TABLET EVERY EVENING  . aspirin 81 MG tablet Take 81 mg by mouth daily.  . Cholecalciferol (VITAMIN D3) 1000 UNITS CAPS Take by mouth 2 (two) times daily.    . ferrous sulfate 325 (65 FE) MG tablet Take 325 mg by mouth 2 (two) times daily.    . furosemide (LASIX) 20 MG tablet TAKE 1/2 TABLET EVERY MORNING.  Marland Kitchen levothyroxine (SYNTHROID, LEVOTHROID) 25 MCG tablet Take 1 tablet (25 mcg total) by mouth daily before breakfast.  . vitamin B-12 (CYANOCOBALAMIN) 1000 MCG tablet Take 1,000 mcg by mouth daily.     No current facility-administered medications on file prior to visit.    Review of Systems Per HPI unless specifically indicated above     Objective:    BP 118/64 mmHg  Pulse 88  Temp(Src)  97.9 F (36.6 C) (Oral)  Wt 127 lb 8 oz (57.834 kg)  Wt Readings from Last 3 Encounters:  11/07/14 127 lb 8 oz (57.834 kg)  11/04/14 126 lb (57.153 kg)  07/09/14 126 lb 8 oz (57.38 kg)    Physical Exam  Constitutional: She appears well-developed and well-nourished. No distress.  HENT:  Mouth/Throat: Oropharynx is clear and moist. No oropharyngeal exudate.  Cardiovascular: Normal rate, regular rhythm, normal heart sounds and intact distal pulses.   No murmur heard. Pulmonary/Chest: Effort normal and breath sounds normal. No respiratory distress. She has no wheezes. She has no rales.  Musculoskeletal: She exhibits no edema.  Nursing note and vitals reviewed.  Results for orders placed or performed during the hospital encounter of 11/04/14  CBC  Result Value Ref Range   WBC 11.5 (H) 4.0 - 10.5 K/uL   RBC 4.00 3.87 - 5.11 MIL/uL   Hemoglobin 12.5 12.0 - 15.0 g/dL   HCT 37.8 36.0 - 46.0 %   MCV 94.5 78.0 - 100.0 fL   MCH 31.3 26.0 - 34.0 pg   MCHC 33.1 30.0 - 36.0 g/dL   RDW 12.8 11.5 - 15.5 %   Platelets 149 (L) 150 - 400 K/uL  Comprehensive metabolic panel  Result Value Ref Range   Sodium 141 135 - 145 mmol/L   Potassium 3.8 3.5 - 5.1 mmol/L   Chloride 104 101 -  111 mmol/L   CO2 27 22 - 32 mmol/L   Glucose, Bld 136 (H) 65 - 99 mg/dL   BUN 22 (H) 6 - 20 mg/dL   Creatinine, Ser 1.19 (H) 0.44 - 1.00 mg/dL   Calcium 9.3 8.9 - 10.3 mg/dL   Total Protein 5.6 (L) 6.5 - 8.1 g/dL   Albumin 3.0 (L) 3.5 - 5.0 g/dL   AST 22 15 - 41 U/L   ALT 13 (L) 14 - 54 U/L   Alkaline Phosphatase 46 38 - 126 U/L   Total Bilirubin 0.9 0.3 - 1.2 mg/dL   GFR calc non Af Amer 39 (L) >60 mL/min   GFR calc Af Amer 45 (L) >60 mL/min   Anion gap 10 5 - 15  I-stat troponin, ED  Result Value Ref Range   Troponin i, poc 0.01 0.00 - 0.08 ng/mL   Comment 3          I-stat troponin, ED  Result Value Ref Range   Troponin i, poc 0.00 0.00 - 0.08 ng/mL   Comment 3              Assessment & Plan:    Problem List Items Addressed This Visit    Chest pain - Primary    Reviewed records - reassuring evaluation in ER. Also reassuring that pt has had no recurrent chest pain since starting PPI - pointing to GI cause like GERD. Encouraged she continue PPI. Update if persistent sxs. Reviewed EKG from ER - abnormal. Will rpt today.   EKG - NSR rate 90s, normal axis, intervals, no acute ST/T changes, good r ware progression.       Relevant Orders   EKG 12-Lead (Completed)    Other Visit Diagnoses    Need for influenza vaccination        Relevant Orders    Flu Vaccine QUAD 36+ mos PF IM (Fluarix & Fluzone Quad PF)        Follow up plan: Return if symptoms worsen or fail to improve.

## 2014-11-07 NOTE — Progress Notes (Signed)
Pre visit review using our clinic review tool, if applicable. No additional management support is needed unless otherwise documented below in the visit note. 

## 2014-11-07 NOTE — Assessment & Plan Note (Signed)
Reviewed records - reassuring evaluation in ER. Also reassuring that pt has had no recurrent chest pain since starting PPI - pointing to GI cause like GERD. Encouraged she continue PPI. Update if persistent sxs. Reviewed EKG from ER - abnormal. Will rpt today.   EKG - NSR rate 90s, normal axis, intervals, no acute ST/T changes, good r ware progression.

## 2014-11-08 ENCOUNTER — Encounter: Payer: Self-pay | Admitting: *Deleted

## 2014-11-20 DIAGNOSIS — Z96642 Presence of left artificial hip joint: Secondary | ICD-10-CM | POA: Diagnosis not present

## 2014-11-22 ENCOUNTER — Encounter: Payer: Self-pay | Admitting: Family Medicine

## 2014-11-22 ENCOUNTER — Ambulatory Visit: Payer: Medicare Other | Admitting: Primary Care

## 2014-11-22 ENCOUNTER — Ambulatory Visit (INDEPENDENT_AMBULATORY_CARE_PROVIDER_SITE_OTHER): Payer: Medicare Other | Admitting: Family Medicine

## 2014-11-22 VITALS — BP 124/66 | HR 96 | Temp 97.6°F | Wt 130.8 lb

## 2014-11-22 DIAGNOSIS — F0390 Unspecified dementia without behavioral disturbance: Secondary | ICD-10-CM

## 2014-11-22 NOTE — Patient Instructions (Signed)
Don't change anything for now.  Update Dr. Darnell Level Monday if needed.  Take care. Glad to see you.

## 2014-11-22 NOTE — Progress Notes (Signed)
Pre visit review using our clinic review tool, if applicable. No additional management support is needed unless otherwise documented below in the visit note.  Patient was moved to my schedule to facilitate patient care due to ongoing needs with another patient at the clinic.   Explained to patient and caregiver, they accepted.  Glad to see patient.   She doesn't want to sleep at night in the bed.  Wants to stay up in a chair.  This happened prev after she came home from the NH.   Recently with a ST, improved now.  No dysphagia now, may have had some prev attributed to ST.  Not actual choking.   Recently started on prilosec.  No vomiting.  No fevers.   No inc in WOB.  No chest pain.  Trace BLE edema at baseline.   Dementia hx noted.   Meds, vitals, and allergies reviewed.   ROS: See HPI.  Otherwise, noncontributory.  nad ncat Elderly female Speech is fluent but short sentences noted, likely baseline for patient.  OP wnl MMM Neck supple, no LA rrr ctab abd soft, not ttp Trace BLE edema

## 2014-11-23 NOTE — Assessment & Plan Note (Signed)
Now with some sleep changes and possible (resolved now) ST that may have contributed to short term swallowing changes/discomfort.   No swallowing sx at this point.   Wouldn't change meds at this point.   Speech therapy and swallow study likely not to change mgmt as patient already has aspiration precautions.  Would continue to direct patient to the bed and not chair at night.  Caregiver agrees.  Given benign exam, would have them update [name] as needed.  All agree.   Okay for outpatient f/u.

## 2014-11-28 ENCOUNTER — Telehealth: Payer: Self-pay | Admitting: Family Medicine

## 2014-11-28 MED ORDER — HYDROXYZINE HCL 10 MG PO TABS: 10.0000 mg | ORAL_TABLET | Freq: Every evening | ORAL | Status: DC | PRN

## 2014-11-28 NOTE — Telephone Encounter (Signed)
I have open slots toady - could see pt today at 3:15pm but otherwise don't have any other appts available - and am out of office tomorrow.  Would rec eval in office today or tomorrow. Pt saw Dr Damita Dunnings Friday - will route to him to see if any other recommendations.

## 2014-11-28 NOTE — Telephone Encounter (Signed)
Patient Name: [name]  DOB: 01/14/1921    Initial Comment caller states her mother is c/o of not being able to breath - especially when she is getting ready to go to bed   Nurse Assessment  Nurse: Raphael Gibney, RN, Vanita Ingles Date/Time Eilene Ghazi Time): 11/28/2014 9:55:44 AM  Confirm and document reason for call. If symptomatic, describe symptoms. ---Caller states mother says she is unable to breathe when she goes to bed. Went to the ER a couple of weeks ago for chest pain and they thought it was due to stomach acid. No fever or cough. Has had a scratchy throat. Caller's sister says she sleeps. During the day she is fine. Has a little bit of swelling in her ankles. She is breathing ok now. She is taking prilosec for chest pain.  Has the patient traveled out of the country within the last 30 days? ---No  Does the patient have any new or worsening symptoms? ---Yes  Will a triage be completed? ---Yes  Related visit to physician within the last 2 weeks? ---Yes  Does the PT have any chronic conditions? (i.e. diabetes, asthma, etc.) ---Yes  List chronic conditions. ---HTN     Guidelines    Guideline Title Affirmed Question Affirmed Notes  Leg Swelling and Edema [1] MILD swelling of both ankles (i.e., pedal edema) AND [2] is a chronic symptom (recurrent or ongoing AND present > 4 weeks)    Final Disposition User   See Physician within 4 Hours (or PCP triage) Raphael Gibney, RN, Vera    Comments  Triage outcome upgraded to see physician within 4 hrs due to pt's age and she says she is having difficulty breathing at night. No appts available at [facility] and daughter does not want to take her to another office. Would like call back regarding pt's saying she is having more difficulty breathing at night. Daughter is not sure if she is more anxious.   Disagree/Comply: Comply

## 2014-11-28 NOTE — Telephone Encounter (Signed)
Message left for daughter to return my call.

## 2014-11-28 NOTE — Telephone Encounter (Signed)
Okay to try hydroxyzine at night if this is thought to be due to anxiety.  O/w please schedule with me or G as soon as feasible.   rx sent.  Sedation caution on the med.

## 2014-11-29 NOTE — Telephone Encounter (Signed)
Dot called and wants to know if can take hydroxyzine earlier in the evening. Advised instructions are to take prn for anxiety at hs. Caution about pt being sleepy and fall precautions. Dot voiced understanding.to Dr Damita Dunnings as Juluis Rainier.

## 2014-11-29 NOTE — Telephone Encounter (Signed)
I'm okay with taking it a little earlier in the evening if needed, if getting anxious after supper.  I wouldn't give a second dose before bed.  Thanks.

## 2014-11-29 NOTE — Telephone Encounter (Signed)
Dot notified and verbalized understanding.

## 2014-11-30 ENCOUNTER — Other Ambulatory Visit: Payer: Self-pay | Admitting: Family Medicine

## 2014-12-02 ENCOUNTER — Telehealth: Payer: Self-pay | Admitting: Family Medicine

## 2014-12-02 NOTE — Telephone Encounter (Signed)
Please call Dot back regarding bleeding for a few days and then stops.  Dr Darnell Level told her what this was, but the family can not remember. cb number is [phone] Thank you

## 2014-12-03 NOTE — Telephone Encounter (Signed)
Spoke with Dot. It appears she had bleeding last year that was Dx as UTI. She has started having some more intermittent bleeding now. Appt scheduled for eval.

## 2014-12-04 ENCOUNTER — Encounter: Payer: Self-pay | Admitting: Family Medicine

## 2014-12-04 ENCOUNTER — Ambulatory Visit (INDEPENDENT_AMBULATORY_CARE_PROVIDER_SITE_OTHER): Payer: Medicare Other | Admitting: Family Medicine

## 2014-12-04 VITALS — BP 118/68 | HR 96 | Temp 98.1°F | Wt 125.0 lb

## 2014-12-04 DIAGNOSIS — N3001 Acute cystitis with hematuria: Secondary | ICD-10-CM

## 2014-12-04 DIAGNOSIS — R319 Hematuria, unspecified: Secondary | ICD-10-CM

## 2014-12-04 HISTORY — DX: Acute cystitis with hematuria: N30.01

## 2014-12-04 LAB — POCT URINALYSIS DIPSTICK
Bilirubin, UA: NEGATIVE
GLUCOSE UA: NEGATIVE
Ketones, UA: NEGATIVE
NITRITE UA: NEGATIVE
PH UA: 6
SPEC GRAV UA: 1.025
UROBILINOGEN UA: NEGATIVE

## 2014-12-04 MED ORDER — CEPHALEXIN 500 MG PO CAPS: 500.0000 mg | ORAL_CAPSULE | Freq: Two times a day (BID) | ORAL | Status: DC

## 2014-12-04 NOTE — Progress Notes (Signed)
Pre visit review using our clinic review tool, if applicable. No additional management support is needed unless otherwise documented below in the visit note. 

## 2014-12-04 NOTE — Patient Instructions (Addendum)
You have urine infection - treat with keflex 500mg  twice daily for 1 week. Urine culture sent in today. We will recheck at next visit to ensure no more blood in urine.  Let us know if not improving with treatment.  Urinary Tract Infection Urinary tract infections (UTIs) can develop anywhere along your urinary tract. Your urinary tract is your body's drainage system for removing wastes and extra water. Your urinary tract includes two kidneys, two ureters, a bladder, and a urethra. Your kidneys are a pair of bean-shaped organs. Each kidney is about the size of your fist. They are located below your ribs, one on each side of your spine. CAUSES Infections are caused by microbes, which are microscopic organisms, including fungi, viruses, and bacteria. These organisms are so small that they can only be seen through a microscope. Bacteria are the microbes that most commonly cause UTIs. SYMPTOMS  Symptoms of UTIs may vary by age and gender of the patient and by the location of the infection. Symptoms in young women typically include a frequent and intense urge to urinate and a painful, burning feeling in the bladder or urethra during urination. Older women and men are more likely to be tired, shaky, and weak and have muscle aches and abdominal pain. A fever may mean the infection is in your kidneys. Other symptoms of a kidney infection include pain in your back or sides below the ribs, nausea, and vomiting. DIAGNOSIS To diagnose a UTI, your caregiver will ask you about your symptoms. Your caregiver will also ask you to provide a urine sample. The urine sample will be tested for bacteria and white blood cells. White blood cells are made by your body to help fight infection. TREATMENT  Typically, UTIs can be treated with medication. Because most UTIs are caused by a bacterial infection, they usually can be treated with the use of antibiotics. The choice of antibiotic and length of treatment depend on your  symptoms and the type of bacteria causing your infection. HOME CARE INSTRUCTIONS  If you were prescribed antibiotics, take them exactly as your caregiver instructs you. Finish the medication even if you feel better after you have only taken some of the medication.  Drink enough water and fluids to keep your urine clear or pale yellow.  Avoid caffeine, tea, and carbonated beverages. They tend to irritate your bladder.  Empty your bladder often. Avoid holding urine for long periods of time.  Empty your bladder before and after sexual intercourse.  After a bowel movement, women should cleanse from front to back. Use each tissue only once. SEEK MEDICAL CARE IF:   You have back pain.  You develop a fever.  Your symptoms do not begin to resolve within 3 days. SEEK IMMEDIATE MEDICAL CARE IF:   You have severe back pain or lower abdominal pain.  You develop chills.  You have nausea or vomiting.  You have continued burning or discomfort with urination. MAKE SURE YOU:   Understand these instructions.  Will watch your condition.  Will get help right away if you are not doing well or get worse.   This information is not intended to replace advice given to you by your health care provider. Make sure you discuss any questions you have with your health care provider.   Document Released: 10/21/2004 Document Revised: 10/02/2014 Document Reviewed: 02/19/2011 Elsevier Interactive Patient Education [employer].

## 2014-12-04 NOTE — Addendum Note (Signed)
Addended by: Lurlean Nanny on: 12/04/2014 12:06 PM   Modules accepted: Orders

## 2014-12-04 NOTE — Addendum Note (Signed)
Addended by: Ria Bush on: 12/04/2014 12:27 PM   Modules accepted: Miquel Dunn

## 2014-12-04 NOTE — Assessment & Plan Note (Addendum)
No obvious external source of bleed.  Anticipate gross hematuria from acute UTI - treat with keflex 500mg  bid x 7 days. UCx sent. Update if not improving with treatment. Return in 2 wks for rpt UA given marked blood, if persistent bleed will recommend full GYN exam. Pt and daughter agree with plan.

## 2014-12-04 NOTE — Progress Notes (Addendum)
BP 118/68 mmHg  Pulse 96  Temp(Src) 98.1 F (36.7 C) (Oral)  Wt 125 lb (56.7 kg)  SpO2 97%   CC: UTI?  Subjective:    Patient ID: [name], female    DOB: 1920/05/02, 81 y.o.   MRN: [id number]  HPI: [name] is a 81 y.o. female presenting on 12/04/2014 for Hematuria   2-3 wk h/o gross hematuria, mild, without other symptoms.   Denies fevers/chills, flank pain, dysuria, frequency or urinary urgency, nausea or vomiting. Staying well hydrated. Denies vaginal or vulvar rash or external bleeding. Denies worsening weakness or fatigue. Some spotting on depens diapers. No blood in commode.  Hydroxyzine nightly has helped sleep and anxiety at night time. Denies unsteadiness or falls or dry mouth or daytime somnolence.   Relevant past medical, surgical, family and social history reviewed and updated as indicated. Interim medical history since our last visit reviewed. Allergies and medications reviewed and updated. Current Outpatient Prescriptions on File Prior to Visit  Medication Sig  . acetaminophen (TYLENOL) 325 MG tablet Take 2 tablets (650 mg total) by mouth every 6 (six) hours as needed for mild pain or moderate pain (or Fever >/= 101).  Marland Kitchen amLODipine (NORVASC) 5 MG tablet TAKE 1 TABLET EVERY EVENING  . aspirin 81 MG tablet Take 81 mg by mouth daily.  . Cholecalciferol (VITAMIN D3) 1000 UNITS CAPS Take by mouth 2 (two) times daily.    . ferrous sulfate 325 (65 FE) MG tablet Take 325 mg by mouth 2 (two) times daily.    . furosemide (LASIX) 20 MG tablet TAKE 1/2 TABLET EVERY MORNING.  . hydrOXYzine (ATARAX/VISTARIL) 10 MG tablet Take 1 tablet (10 mg total) by mouth at bedtime as needed for anxiety.  Marland Kitchen levothyroxine (SYNTHROID, LEVOTHROID) 25 MCG tablet TAKE 1 TABLET (25 MCG TOTAL) BY MOUTH DAILY BEFORE BREAKFAST.  . Multiple Vitamins-Minerals (EYE VITAMINS & MINERALS PO) Take 1 tablet by mouth daily. [employer] [facility]  . omeprazole (PRILOSEC OTC) 20 MG tablet Take 20 mg by  mouth daily.  . vitamin B-12 (CYANOCOBALAMIN) 1000 MCG tablet Take 1,000 mcg by mouth daily.     No current facility-administered medications on file prior to visit.    Review of Systems Per HPI unless specifically indicated in ROS section     Objective:    BP 118/68 mmHg  Pulse 96  Temp(Src) 98.1 F (36.7 C) (Oral)  Wt 125 lb (56.7 kg)  SpO2 97%  Wt Readings from Last 3 Encounters:  12/04/14 125 lb (56.7 kg)  11/22/14 130 lb 12.8 oz (59.33 kg)  11/07/14 127 lb 8 oz (57.834 kg)    Physical Exam  Constitutional: She appears well-developed and well-nourished. No distress.  HENT:  Mouth/Throat: Oropharynx is clear and moist. No oropharyngeal exudate.  Cardiovascular: Normal rate, normal heart sounds and intact distal pulses.  An irregular rhythm present.  No murmur heard. Pulmonary/Chest: Effort normal and breath sounds normal. No respiratory distress. She has no wheezes. She has no rales.  Abdominal: Soft. Normal appearance and bowel sounds are normal. She exhibits no distension and no mass. There is no hepatosplenomegaly. There is no tenderness. There is no rigidity, no rebound, no guarding, no CVA tenderness and negative Murphy's sign.  Genitourinary: There is no rash, tenderness, lesion or injury on the right labia. There is no rash, tenderness, lesion or injury on the left labia.  No obvious external injury noted today.  Musculoskeletal: She exhibits no edema.  Psychiatric: She has a normal  mood and affect.  Nursing note and vitals reviewed.  Lab Results  Component Value Date   CREATININE 1.19* 11/04/2014    Results for orders placed or performed in visit on 12/04/14 (from the past 24 hour(s))  POCT urinalysis dipstick     Status: Abnormal   Collection Time: 12/04/14 12:04 PM  Result Value Ref Range   Color, UA amber    Clarity, UA cloudy    Glucose, UA neg    Bilirubin, UA neg    Ketones, UA neg    Spec Grav, UA 1.025    Blood, UA large    pH, UA 6.0     Protein, UA trace    Urobilinogen, UA negative    Nitrite, UA neg    Leukocytes, UA large (3+) (A) Negative   Impression   Micro: WBC TNTC RBC TNTC Bact TNTC rods Epi: rare UCx sent.      Assessment & Plan:   Problem List Items Addressed This Visit    Hematuria   Relevant Orders   POCT urinalysis dipstick (Completed)   Acute hemorrhagic cystitis - Primary    No obvious external source of bleed.  Anticipate gross hematuria from acute UTI - treat with keflex 500mg  bid x 7 days. UCx sent. Update if not improving with treatment. Return in 2 wks for rpt UA given marked blood, if persistent bleed will recommend full GYN exam. Pt and daughter agree with plan.      Relevant Orders   Urine culture       Follow up plan: Return in about 2 weeks (around 12/18/2014), or if symptoms worsen or fail to improve, for follow up visit.

## 2014-12-06 LAB — URINE CULTURE

## 2014-12-09 ENCOUNTER — Other Ambulatory Visit: Payer: Self-pay | Admitting: Family Medicine

## 2014-12-09 NOTE — Addendum Note (Signed)
Addended byCloyd Stagers B on: 12/09/2014 01:35 PM   Modules accepted: Miquel Dunn

## 2014-12-11 ENCOUNTER — Telehealth: Payer: Self-pay | Admitting: Family Medicine

## 2014-12-11 DIAGNOSIS — N3001 Acute cystitis with hematuria: Secondary | ICD-10-CM

## 2014-12-11 NOTE — Telephone Encounter (Signed)
Daughter called in wanting to clarify whether pt needs to come in in 2 weeks or if she just needs to drop off urine in 2 weeks. Pt is currently scheduled for follow up appt, but daughter thinks it is a sample drop off instead. Please call back, daughter will be at the building. Number there is [phone] Thank you

## 2014-12-11 NOTE — Telephone Encounter (Signed)
Appt with Dr. Darnell Level cancelled. Only needs urine sample. Message left notifying patient's daughter that appt had been cancelled and to come in for UA only. Also advised to call if symptoms persist.

## 2014-12-18 ENCOUNTER — Ambulatory Visit: Payer: Medicare Other | Admitting: Family Medicine

## 2014-12-18 NOTE — Addendum Note (Signed)
Addended by: Ellamae Sia on: 12/18/2014 03:22 PM   Modules accepted: Orders

## 2014-12-23 ENCOUNTER — Encounter: Payer: Self-pay | Admitting: Gastroenterology

## 2014-12-23 ENCOUNTER — Telehealth (INDEPENDENT_AMBULATORY_CARE_PROVIDER_SITE_OTHER): Payer: Medicare Other | Admitting: Radiology

## 2014-12-23 DIAGNOSIS — R319 Hematuria, unspecified: Secondary | ICD-10-CM

## 2014-12-23 NOTE — Telephone Encounter (Signed)
plz notify pt urine was dumped and rec she come back at her convenience to recollect. Ensure she's not charged for lab.

## 2014-12-23 NOTE — Telephone Encounter (Signed)
Urine on this pt was sent to [facility] lab on 11.23.16. They dumped the urine's w/o resulting.

## 2014-12-24 NOTE — Telephone Encounter (Signed)
Patients daughter notified, will bring her back to leave another sample

## 2014-12-24 NOTE — Telephone Encounter (Signed)
Pt daughter left v/m requesting cb about urine specimen that was left prior to 12/19/14.

## 2014-12-25 DIAGNOSIS — R319 Hematuria, unspecified: Secondary | ICD-10-CM | POA: Diagnosis not present

## 2014-12-25 LAB — URINALYSIS, ROUTINE W REFLEX MICROSCOPIC
Bilirubin Urine: NEGATIVE
Ketones, ur: NEGATIVE
Nitrite: NEGATIVE
Specific Gravity, Urine: 1.015 (ref 1.000–1.030)
Total Protein, Urine: NEGATIVE
Urine Glucose: NEGATIVE
Urobilinogen, UA: 0.2 (ref 0.0–1.0)
pH: 6 (ref 5.0–8.0)

## 2014-12-25 NOTE — Addendum Note (Signed)
Addended by: Ellamae Sia on: 12/25/2014 11:56 AM   Modules accepted: Orders

## 2014-12-27 ENCOUNTER — Telehealth: Payer: Self-pay | Admitting: Family Medicine

## 2014-12-27 ENCOUNTER — Other Ambulatory Visit: Payer: Self-pay | Admitting: *Deleted

## 2014-12-27 LAB — URINE CULTURE
Colony Count: NO GROWTH
Organism ID, Bacteria: NO GROWTH

## 2014-12-27 MED ORDER — HYDROXYZINE HCL 10 MG PO TABS: 10.0000 mg | ORAL_TABLET | Freq: Every evening | ORAL | Status: DC | PRN

## 2014-12-27 NOTE — Telephone Encounter (Signed)
See result note.  

## 2014-12-27 NOTE — Telephone Encounter (Signed)
Pt's daughter Dot called asking for the results of urine test taken on 12/25/14. I informed her that Dr. Danise Mina has been in clinic all morning, will call back as soon as he can. Best number to call is [phone] (Dot).

## 2014-12-27 NOTE — Telephone Encounter (Signed)
Spoke with patient's daughter.

## 2014-12-31 ENCOUNTER — Ambulatory Visit (INDEPENDENT_AMBULATORY_CARE_PROVIDER_SITE_OTHER): Payer: Medicare Other | Admitting: Family Medicine

## 2014-12-31 ENCOUNTER — Encounter: Payer: Self-pay | Admitting: Family Medicine

## 2014-12-31 VITALS — BP 132/62 | HR 80 | Temp 97.5°F | Wt 121.2 lb

## 2014-12-31 DIAGNOSIS — N898 Other specified noninflammatory disorders of vagina: Secondary | ICD-10-CM

## 2014-12-31 DIAGNOSIS — R319 Hematuria, unspecified: Secondary | ICD-10-CM

## 2014-12-31 DIAGNOSIS — N3001 Acute cystitis with hematuria: Secondary | ICD-10-CM | POA: Diagnosis not present

## 2014-12-31 DIAGNOSIS — F0390 Unspecified dementia without behavioral disturbance: Secondary | ICD-10-CM

## 2014-12-31 DIAGNOSIS — R310 Gross hematuria: Secondary | ICD-10-CM

## 2014-12-31 DIAGNOSIS — Z8500 Personal history of malignant neoplasm of unspecified digestive organ: Secondary | ICD-10-CM | POA: Diagnosis not present

## 2014-12-31 DIAGNOSIS — N183 Chronic kidney disease, stage 3 unspecified: Secondary | ICD-10-CM

## 2014-12-31 HISTORY — DX: Gross hematuria: R31.0

## 2014-12-31 LAB — POCT URINALYSIS DIPSTICK
BILIRUBIN UA: NEGATIVE
GLUCOSE UA: NEGATIVE
KETONES UA: NEGATIVE
NITRITE UA: NEGATIVE
PH UA: 6
Spec Grav, UA: >=1.03
Urobilinogen, UA: 0.2

## 2014-12-31 NOTE — Assessment & Plan Note (Signed)
H/o colon cancer, latest colonoscopy 2012 stable.

## 2014-12-31 NOTE — Assessment & Plan Note (Signed)
mild

## 2014-12-31 NOTE — Patient Instructions (Addendum)
Urine culture sent and we will call you with antibiotic choice. Pelvic exam today. Pass by our referral coordinator's office for kidney ultrasound and we will refer you to urologist doctor as well.

## 2014-12-31 NOTE — Progress Notes (Signed)
BP 132/62 mmHg  Pulse 80  Temp(Src) 97.5 F (36.4 C) (Oral)  Wt 121 lb 4 oz (54.999 kg)   CC:  Persistent hematuria Subjective:    Patient ID: [name], female    DOB: 10-05-1920, 81 y.o.   MRN: [id number]  HPI: [name] is a 81 y.o. female presenting on 12/31/2014 for Hematuria   See prior note for details. Seen here 12/04/2014 with several weeks of gross hematuria, found to have acute hemorrhagic cystitis with UCx growing >100k klebsiella sensitive to cefazolin. Treated with 7 d course keflex BID. No obvious external source of bleed found. Rpt UA 2 wks later again abnormal (small blood, mod LE, 3-6 RBC/hpf, 11-20 WBC/hpf and UCx no growth.   Presents today with concerns for recurrent daily bleeding. Notices on her pad. Occasionally in commode.  Denies blood in stool. No fevers/chills, nausea, abd pain, flank pain, dysuria, urgency, frequency, or other symptoms. Good appetite.   Reviewing weight over last year, there have been some fluctuation with peak at 133 lbs and nadir at 121 lbs today.   Relevant past medical, surgical, family and social history reviewed and updated as indicated. Interim medical history since our last visit reviewed. Allergies and medications reviewed and updated. Current Outpatient Prescriptions on File Prior to Visit  Medication Sig  . acetaminophen (TYLENOL) 325 MG tablet Take 2 tablets (650 mg total) by mouth every 6 (six) hours as needed for mild pain or moderate pain (or Fever >/= 101).  Marland Kitchen amLODipine (NORVASC) 5 MG tablet TAKE 1 TABLET EVERY EVENING  . aspirin 81 MG tablet Take 81 mg by mouth daily.  . Cholecalciferol (VITAMIN D3) 1000 UNITS CAPS Take by mouth 2 (two) times daily.    . ferrous sulfate 325 (65 FE) MG tablet Take 325 mg by mouth 2 (two) times daily.    . furosemide (LASIX) 20 MG tablet TAKE 1/2 TABLET BY MOUTH EVERY MORNING.  . hydrOXYzine (ATARAX/VISTARIL) 10 MG tablet Take 1 tablet (10 mg total) by mouth at bedtime as  needed for anxiety.  Marland Kitchen levothyroxine (SYNTHROID, LEVOTHROID) 25 MCG tablet TAKE 1 TABLET (25 MCG TOTAL) BY MOUTH DAILY BEFORE BREAKFAST.  . Multiple Vitamins-Minerals (EYE VITAMINS & MINERALS PO) Take 1 tablet by mouth daily. [employer]  . omeprazole (PRILOSEC OTC) 20 MG tablet Take 20 mg by mouth daily.  . vitamin B-12 (CYANOCOBALAMIN) 1000 MCG tablet Take 1,000 mcg by mouth daily.     No current facility-administered medications on file prior to visit.   Past Medical History  Diagnosis Date  . Hypertension 10/1998  . Hyperlipidemia 12/2002  . Diastolic CHF ([place])   . Vaginal delivery     x4, at home  . Anemia 12/30-01/25/2006    [facility]- CHF- started lasix  . History of colon cancer     s/p partial colectomy  . History of ETT 03/15/2006    adenosine myoview low risk  . Hypothyroidism   . Microhematuria 03/2013    watchful waiting ([place])  . CKD (chronic kidney disease), stage III 03/04/2010  . COPD (chronic obstructive pulmonary disease) ([place]) 02/2014    by CXR and VQ scan    Past Surgical History  Procedure Laterality Date  . Cholecystectomy  1984  . Partial colectomy  03/25/2006    right  . Esophagogastroduodenoscopy      B9 polyp H.Pylori bx- "dilated" polyps  . History of abd  02/25/2006    bilat renal cysts, no mets  . Ct head  limited w/o cm  02/2011    no acute process.  Mild to moderate cortical volume loss and scattered small vessel ischemic microangiopathy  . Colonoscopy  03/2010    1 tubular adenoma, int hemorrhoids, f/u prn [name]  . Hip arthroplasty Left 02/01/2014    hemiarthroplasty s/p fem fracture after fall [name], MD)    Family History  Problem Relation Age of Onset  . Stroke Sister   . Diabetes Sister   . Hypertension Sister   . Stroke Mother   . Alcohol abuse Brother     Social History  Substance Use Topics  . Smoking status: Never Smoker   . Smokeless tobacco: Never Used  . Alcohol Use: No    Review of Systems Per HPI unless  specifically indicated in ROS section     Objective:    BP 132/62 mmHg  Pulse 80  Temp(Src) 97.5 F (36.4 C) (Oral)  Wt 121 lb 4 oz (54.999 kg)  Wt Readings from Last 3 Encounters:  12/31/14 121 lb 4 oz (54.999 kg)  12/04/14 125 lb (56.7 kg)  11/22/14 130 lb 12.8 oz (59.33 kg)    Physical Exam  Constitutional: She appears well-developed and well-nourished. No distress.  Abdominal: Soft. Bowel sounds are normal. She exhibits no distension and no mass. There is no tenderness. There is no rebound and no guarding.  Genitourinary: Pelvic exam was performed with patient supine. There is no rash, tenderness or lesion on the right labia. There is no rash, tenderness or lesion on the left labia. Right adnexum displays no mass and no fullness. Left adnexum displays no mass and no fullness. No erythema or tenderness in the vagina. No signs of injury around the vagina.  Limited exam 2/2 muscular stiffness and h/o L hip replacement Atrophic vaginal wall Scant bleeding No significant discharge.  Musculoskeletal: She exhibits no edema.  Nursing note and vitals reviewed.  Results for orders placed or performed in visit on 12/31/14  POCT Urinalysis Dipstick  Result Value Ref Range   Color, UA Yellow    Clarity, UA Cloudy    Glucose, UA Negative    Bilirubin, UA Negative    Ketones, UA Negative    Spec Grav, UA >=1.030    Blood, UA 3+    pH, UA 6.0    Protein, UA Trace    Urobilinogen, UA 0.2    Nitrite, UA Negative    Leukocytes, UA large (3+) (A) Negative   Lab Results  Component Value Date   CREATININE 1.19* 11/04/2014      Assessment & Plan:   Problem List Items Addressed This Visit    Senile dementia    mild      Personal history of malignant neoplasm of gastrointestinal tract    H/o colon cancer, latest colonoscopy 2012 stable.      Gross hematuria - Primary    Recently treated for hemorrhagic cystitis with 1 wk keflex, and test of cure culture returned no growth. Pt  endorses persistent hematuria - predominantly on her pad but some in commode as well. Rpt UA concerning for rpt infection - culture sent. However denies significant UTI sxs. Limited GYN exam without obvious vaginal source of bleed today.  With persistent gross hematuria will also check renal ultrasound and refer to urology for further eval (avoid contrasted CT due to CKD). Given endorsed brown discharge - will also check wet prep today.  Pt and daughter agree with plan.      Relevant Orders  POCT Urinalysis Dipstick (Completed)   Urine culture   US Renal   Ambulatory referral to Urology   CKD (chronic kidney disease), stage III    Other Visit Diagnoses    Vaginal discharge        Relevant Orders    Wet prep, genital    WET PREP BY MOLECULAR PROBE        Follow up plan: Return if symptoms worsen or fail to improve.

## 2014-12-31 NOTE — Assessment & Plan Note (Addendum)
Recently treated for hemorrhagic cystitis with 1 wk keflex, and test of cure culture returned no growth. Pt endorses persistent hematuria - predominantly on her pad but some in commode as well. Rpt UA concerning for rpt infection - culture sent. However denies significant UTI sxs. Limited GYN exam without obvious vaginal source of bleed today.  With persistent gross hematuria will also check renal ultrasound and refer to urology for further eval (avoid contrasted CT due to CKD). Given endorsed brown discharge - will also check wet prep today.  Pt and daughter agree with plan.

## 2014-12-31 NOTE — Progress Notes (Signed)
Pre visit review using our clinic review tool, if applicable. No additional management support is needed unless otherwise documented below in the visit note. 

## 2014-12-31 NOTE — Addendum Note (Signed)
Addended by: Ria Bush on: 12/31/2014 05:44 PM   Modules accepted: Miquel Dunn

## 2015-01-01 LAB — WET PREP BY MOLECULAR PROBE
Candida species: NEGATIVE
GARDNERELLA VAGINALIS: NEGATIVE
Trichomonas vaginosis: NEGATIVE

## 2015-01-01 NOTE — Addendum Note (Signed)
Addended by: Ellamae Sia on: 01/01/2015 12:48 PM   Modules accepted: Orders

## 2015-01-03 ENCOUNTER — Telehealth: Payer: Self-pay | Admitting: Family Medicine

## 2015-01-03 ENCOUNTER — Other Ambulatory Visit: Payer: Self-pay | Admitting: Family Medicine

## 2015-01-03 LAB — URINE CULTURE

## 2015-01-03 MED ORDER — CIPROFLOXACIN HCL 250 MG PO TABS: 250.0000 mg | ORAL_TABLET | Freq: Two times a day (BID) | ORAL | Status: DC

## 2015-01-03 NOTE — Telephone Encounter (Signed)
Patient's daughter notified and will check with urology at appt.

## 2015-01-03 NOTE — Telephone Encounter (Signed)
Spoke with patient's daughter. She said that urology can do renal US there at the office. Her appt at urology is scheduled on the same day as the [name], but the [name] is scheduled for later in the afternoon. She was asking if they could just have the US done at urology to avoid so many trips out with patient.

## 2015-01-03 NOTE — Telephone Encounter (Signed)
I can't order renal ultrasound at urologist office. Up to urologist. Would have them talk with him at appt and decide.

## 2015-01-03 NOTE — Telephone Encounter (Signed)
Patient's daughter,Dot,is asking for Maudie Mercury to return her call about an appointment.

## 2015-01-06 ENCOUNTER — Ambulatory Visit
Admission: RE | Admit: 2015-01-06 | Discharge: 2015-01-06 | Disposition: A | Discharge status: Home / Self Care | Payer: Medicare Other | Source: Ambulatory Visit | Attending: Family Medicine | Admitting: Family Medicine

## 2015-01-06 DIAGNOSIS — R310 Gross hematuria: Secondary | ICD-10-CM | POA: Diagnosis not present

## 2015-01-06 DIAGNOSIS — N281 Cyst of kidney, acquired: Secondary | ICD-10-CM | POA: Diagnosis not present

## 2015-01-06 DIAGNOSIS — R350 Frequency of micturition: Secondary | ICD-10-CM | POA: Diagnosis not present

## 2015-01-08 ENCOUNTER — Encounter: Payer: Self-pay | Admitting: Family Medicine

## 2015-01-08 ENCOUNTER — Ambulatory Visit (INDEPENDENT_AMBULATORY_CARE_PROVIDER_SITE_OTHER): Payer: Medicare Other | Admitting: Family Medicine

## 2015-01-08 VITALS — BP 110/60 | HR 88 | Temp 97.8°F | Ht 66.0 in | Wt 117.0 lb

## 2015-01-08 DIAGNOSIS — I10 Essential (primary) hypertension: Secondary | ICD-10-CM

## 2015-01-08 DIAGNOSIS — I5032 Chronic diastolic (congestive) heart failure: Secondary | ICD-10-CM

## 2015-01-08 DIAGNOSIS — R310 Gross hematuria: Secondary | ICD-10-CM

## 2015-01-08 DIAGNOSIS — Z7189 Other specified counseling: Secondary | ICD-10-CM

## 2015-01-08 DIAGNOSIS — Z0000 Encounter for general adult medical examination without abnormal findings: Secondary | ICD-10-CM | POA: Diagnosis not present

## 2015-01-08 DIAGNOSIS — E039 Hypothyroidism, unspecified: Secondary | ICD-10-CM

## 2015-01-08 DIAGNOSIS — N183 Chronic kidney disease, stage 3 unspecified: Secondary | ICD-10-CM

## 2015-01-08 DIAGNOSIS — R634 Abnormal weight loss: Secondary | ICD-10-CM

## 2015-01-08 DIAGNOSIS — N3001 Acute cystitis with hematuria: Secondary | ICD-10-CM

## 2015-01-08 DIAGNOSIS — F0390 Unspecified dementia without behavioral disturbance: Secondary | ICD-10-CM

## 2015-01-08 MED ORDER — AMLODIPINE BESYLATE 5 MG PO TABS: 5.0000 mg | ORAL_TABLET | Freq: Every evening | ORAL | Status: DC

## 2015-01-08 NOTE — Assessment & Plan Note (Signed)
Tolerating low dose levothyroxine. Check TSH next visit.

## 2015-01-08 NOTE — Progress Notes (Signed)
Pre visit review using our clinic review tool, if applicable. No additional management support is needed unless otherwise documented below in the visit note. 

## 2015-01-08 NOTE — Progress Notes (Signed)
BP 110/60 mmHg  Pulse 88  Temp(Src) 97.8 F (36.6 C) (Oral)  Ht 5\' 6"  (1.676 m)  Wt 117 lb (53.071 kg)  BMI 18.89 kg/m2   CC: medicare wellness visit  Subjective:    Patient ID: [name], female    DOB: 1920/07/30, 81 y.o.   MRN: [id number]  HPI: [name] is a 81 y.o. female presenting on 01/08/2015 for Annual Exam   Referred to urology for ?gross hematuria (blood on pad) with recurrent cystitis. Renal US stable (atrophic kidneys with large cyst R side, small possible R kidney stone). Latest UCx 12/6 with >100k pansensitive Ecoli, treated with cipro 250mg  BID x1 wk. She saw urologist Dr McDiarmid but before [name] was done - daughter upset it was a waste of time because uro did not have all information ([name] results).   Geriatric assessment 04/2013 - Mental Status Exam: 13/30. Missed 5 orientation, 5 attention/calculation, 3 recall, and 4 language.  ~8th grade education  Clock Drawing Score: 3/4 Independent ADLs, dependent in IADLs. Exam last visit thought consistent with senile dementia as no behavior changes, able to recognize family well.  Passed hearing screen.  Failed eye exam - pending f/u with eye doctor. Denies depression, anxiety, sadness, anhedonia.  1 fall 01/2014 s/p L hip fracture. Uses walker regularly.   Preventative: Colonoscopy 03/2010 - 1 polyp, int hemorrhoids. rec f/u with GI PRN. [name])  Mammogram - normal 12/2013 Flu shot - today. 10/2014 Tetanus 06/2013 Pneumovax and prevnar completed Shingles shot - 2015 Advanced directives: would want HCPOA to be any of her 3 children. Does not want prolonged life support, does not want feeding tube or breathing tube. Discussed advanced directive. Has not set up. Not interested in setting up.  Seat belt use discussed Denies changing moles on skin.   Married, widow 84  Lives with youngest daughter ([name]) and her husband and youngest son, other daughter lives next door (caregiver) Occupation:  [name] 83yrs Edu: some HS Retired  Relevant past medical, surgical, family and social history reviewed and updated as indicated. Interim medical history since our last visit reviewed. Allergies and medications reviewed and updated. Current Outpatient Prescriptions on File Prior to Visit  Medication Sig  . acetaminophen (TYLENOL) 325 MG tablet Take 2 tablets (650 mg total) by mouth every 6 (six) hours as needed for mild pain or moderate pain (or Fever >/= 101).  Marland Kitchen aspirin 81 MG tablet Take 81 mg by mouth daily.  . Cholecalciferol (VITAMIN D3) 1000 UNITS CAPS Take by mouth 2 (two) times daily.    . ciprofloxacin (CIPRO) 250 MG tablet Take 1 tablet (250 mg total) by mouth 2 (two) times daily.  . ferrous sulfate 325 (65 FE) MG tablet Take 325 mg by mouth 2 (two) times daily.    . furosemide (LASIX) 20 MG tablet TAKE 1/2 TABLET BY MOUTH EVERY MORNING.  . hydrOXYzine (ATARAX/VISTARIL) 10 MG tablet Take 1 tablet (10 mg total) by mouth at bedtime as needed for anxiety.  Marland Kitchen levothyroxine (SYNTHROID, LEVOTHROID) 25 MCG tablet TAKE 1 TABLET (25 MCG TOTAL) BY MOUTH DAILY BEFORE BREAKFAST.  . Multiple Vitamins-Minerals (EYE VITAMINS & MINERALS PO) Take 1 tablet by mouth daily. [facility]  . omeprazole (PRILOSEC OTC) 20 MG tablet Take 20 mg by mouth daily.  . vitamin B-12 (CYANOCOBALAMIN) 1000 MCG tablet Take 1,000 mcg by mouth daily.     No current facility-administered medications on file prior to visit.    Review of Systems  Constitutional: Positive for unexpected weight change. Negative for fever, chills, activity change, appetite change and fatigue.  HENT: Negative for hearing loss.   Eyes: Negative for visual disturbance.  Respiratory: Negative for cough, chest tightness, shortness of breath and wheezing.   Cardiovascular: Negative for chest pain, palpitations and leg swelling.  Gastrointestinal: Negative for nausea, vomiting, abdominal pain, diarrhea, constipation, blood in stool and  abdominal distention.  Genitourinary: Positive for hematuria. Negative for difficulty urinating.  Musculoskeletal: Negative for myalgias, arthralgias and neck pain.  Skin: Negative for rash.  Neurological: Negative for dizziness, seizures, syncope and headaches.  Hematological: Negative for adenopathy. Does not bruise/bleed easily.  Psychiatric/Behavioral: Negative for dysphoric mood. The patient is not nervous/anxious.    Per HPI unless specifically indicated in ROS section     Objective:    BP 110/60 mmHg  Pulse 88  Temp(Src) 97.8 F (36.6 C) (Oral)  Ht 5\' 6"  (1.676 m)  Wt 117 lb (53.071 kg)  BMI 18.89 kg/m2  Wt Readings from Last 3 Encounters:  01/08/15 117 lb (53.071 kg)  12/31/14 121 lb 4 oz (54.999 kg)  12/04/14 125 lb (56.7 kg)    Physical Exam  Constitutional: She is oriented to person, place, and time. She appears well-developed and well-nourished. No distress.  HENT:  Head: Normocephalic and atraumatic.  Right Ear: Hearing, tympanic membrane, external ear and ear canal normal.  Left Ear: Hearing, tympanic membrane, external ear and ear canal normal.  Nose: Nose normal.  Mouth/Throat: Uvula is midline, oropharynx is clear and moist and mucous membranes are normal. No oropharyngeal exudate, posterior oropharyngeal edema or posterior oropharyngeal erythema.  Eyes: Conjunctivae and EOM are normal. Pupils are equal, round, and reactive to light. No scleral icterus.  Neck: Normal range of motion. Neck supple. No thyromegaly present.  Cardiovascular: Normal rate, regular rhythm, normal heart sounds and intact distal pulses.   No murmur heard. Pulses:      Radial pulses are 2+ on the right side, and 2+ on the left side.  Pulmonary/Chest: Effort normal and breath sounds normal. No respiratory distress. She has no wheezes. She has no rales.  Breast - deferred  Abdominal: Soft. Bowel sounds are normal. She exhibits no distension and no mass. There is no tenderness. There is no  rebound and no guarding.  Genitourinary:  GYN - deferred  Musculoskeletal: Normal range of motion. She exhibits no edema.  Lymphadenopathy:    She has no cervical adenopathy.  Neurological: She is alert and oriented to person, place, and time.  CN grossly intact, station and gait intact  Skin: Skin is warm and dry. No rash noted.  Psychiatric: She has a normal mood and affect. Her behavior is normal. Judgment and thought content normal.  Nursing note and vitals reviewed.  Results for orders placed or performed in visit on 12/31/14  Urine culture  Result Value Ref Range   Culture ESCHERICHIA COLI    Colony Count >=100,000 COLONIES/ML    Organism ID, Bacteria ESCHERICHIA COLI       Susceptibility   Escherichia coli -  (no method available)    AMPICILLIN 8 Sensitive     AMOX/CLAVULANIC 4 Sensitive     AMPICILLIN/SULBACTAM 4 Sensitive     PIP/TAZO <=4 Sensitive     IMIPENEM <=0.25 Sensitive     CEFAZOLIN <=4 Not Reportable     CEFTRIAXONE <=1 Sensitive     CEFTAZIDIME <=1 Sensitive     CEFEPIME <=1 Sensitive     GENTAMICIN <=1 Sensitive  TOBRAMYCIN <=1 Sensitive     CIPROFLOXACIN <=0.25 Sensitive     LEVOFLOXACIN <=0.12 Sensitive     NITROFURANTOIN <=16 Sensitive     TRIMETH/SULFA* <=20 Sensitive      * NR=NOT REPORTABLE,SEE COMMENTORAL therapy:A cefazolin MIC of <32 predicts susceptibility to the oral agents cefaclor,cefdinir,cefpodoxime,cefprozil,cefuroxime,cephalexin,and loracarbef when used for therapy of uncomplicated UTIs due to E.coli,K.pneumomiae,and P.mirabilis. PARENTERAL therapy: A cefazolinMIC of >8 indicates resistance to parenteralcefazolin. An alternate test method must beperformed to confirm susceptibility to parenteralcefazolin.  WET PREP BY MOLECULAR PROBE  Result Value Ref Range   Candida species NEG Negative   Trichomonas vaginosis NEG Negative   Gardnerella vaginalis NEG Negative  POCT Urinalysis Dipstick  Result Value Ref Range   Color, UA Yellow     Clarity, UA Cloudy    Glucose, UA Negative    Bilirubin, UA Negative    Ketones, UA Negative    Spec Grav, UA >=1.030    Blood, UA 3+    pH, UA 6.0    Protein, UA Trace    Urobilinogen, UA 0.2    Nitrite, UA Negative    Leukocytes, UA large (3+) (A) Negative      Assessment & Plan:   Problem List Items Addressed This Visit    Weight loss    Ongoing noted today. Reviewed recent overall stable labs, hypoalbuminemia noted however. Will need labs next visit. Daughter reports good appetite.       Senile dementia    Stable period. No change in assistance needs noted.      Medicare annual wellness visit, subsequent - Primary    I have personally reviewed the Medicare Annual Wellness questionnaire and have noted 1. The patient's medical and social history 2. Their use of alcohol, tobacco or illicit drugs 3. Their current medications and supplements 4. The patient's functional ability including ADL's, fall risks, home safety risks and hearing or visual impairment. Cognitive function has been assessed and addressed as indicated.  5. Diet and physical activity 6. Evidence for depression or mood disorders The patients weight, height, BMI have been recorded in the chart. I have made referrals, counseling and provided education to the patient based on review of the above and I have provided the pt with a written personalized care plan for preventive services. Provider list updated.. See scanned questionairre as needed for further documentation. Reviewed preventative protocols and updated unless pt declined.       Hypothyroidism    Tolerating low dose levothyroxine. Check TSH next visit.      Health maintenance examination    Preventative protocols reviewed and updated unless pt declined. Discussed healthy diet and lifestyle.       Gross hematuria    Daughter endorses this has resolved with cipro. Continue to monitor. Will await uro eval. Will forward recent renal US to Dr  Matilde Sprang      Essential hypertension    Chronic, stable. Continue amlodipine and lasix.      Relevant Medications   amLODipine (NORVASC) 5 MG tablet   CKD (chronic kidney disease), stage III    Chronic, stable. check labs next visit.      Chronic diastolic CHF (congestive heart failure) (HCC)    Chronic, stable. Asxs. Continue lasix daily.      Relevant Medications   amLODipine (NORVASC) 5 MG tablet   Advanced care planning/counseling discussion    Advanced directives: would want HCPOA to be any of her 3 children. Does not want prolonged life support, does not want  feeding tube or breathing tube. Discussed advanced directive. Has not set up. Not interested in setting up. Declines packet.       Acute hemorrhagic cystitis    Finishing cipro course. If recurrent bleed, consider daily ppx abx.          Follow up plan: Return in about 6 months (around 07/09/2015), or as needed, for follow up visit.

## 2015-01-08 NOTE — Assessment & Plan Note (Signed)
Finishing cipro course. If recurrent bleed, consider daily ppx abx.

## 2015-01-08 NOTE — Assessment & Plan Note (Signed)

## 2015-01-08 NOTE — Assessment & Plan Note (Signed)
Preventative protocols reviewed and updated unless pt declined. Discussed healthy diet and lifestyle.  

## 2015-01-08 NOTE — Assessment & Plan Note (Signed)
Chronic, stable. Continue amlodipine and lasix. 

## 2015-01-08 NOTE — Assessment & Plan Note (Signed)
Ongoing noted today. Reviewed recent overall stable labs, hypoalbuminemia noted however. Will need labs next visit. Daughter reports good appetite.

## 2015-01-08 NOTE — Assessment & Plan Note (Signed)
Daughter endorses this has resolved with cipro. Continue to monitor. Will await uro eval. Will forward recent renal US to Dr Matilde Sprang

## 2015-01-08 NOTE — Patient Instructions (Addendum)
Consider mammogram 12/2015. You are doing well today. Finish antibiotics and let us know if bleeding recurs. I will send kidney ultrasound to Dr McDiarmid to see if any other recommendations. If not we will just continue to monitor here. Return as needed or in 6-8 months for follow up.

## 2015-01-08 NOTE — Assessment & Plan Note (Signed)
Stable period. No change in assistance needs noted.

## 2015-01-08 NOTE — Assessment & Plan Note (Signed)
Advanced directives: would want HCPOA to be any of her 3 children. Does not want prolonged life support, does not want feeding tube or breathing tube. Discussed advanced directive. Has not set up. Not interested in setting up. Declines packet.

## 2015-01-08 NOTE — Assessment & Plan Note (Signed)
Chronic, stable. check labs next visit.

## 2015-01-08 NOTE — Assessment & Plan Note (Signed)
Chronic, stable. Asxs. Continue lasix daily.

## 2015-01-09 ENCOUNTER — Other Ambulatory Visit: Payer: Self-pay | Admitting: Family Medicine

## 2015-01-24 ENCOUNTER — Telehealth (INDEPENDENT_AMBULATORY_CARE_PROVIDER_SITE_OTHER): Payer: Medicare Other | Admitting: Family Medicine

## 2015-01-24 DIAGNOSIS — R319 Hematuria, unspecified: Secondary | ICD-10-CM

## 2015-01-24 NOTE — Telephone Encounter (Signed)
[place]    --------------------------------------------------------------------------------   Patient Name: [name]  Gender: Female  DOB: 05-Oct-1920   Age: 81 Y 1 M 15 D  Return Phone Number: [phone] (Primary), [phone] (Secondary)  Address:     [place]  [address]   Client [facility] - Client  Client Site [place], [place]   Contact Type Call  Call Type Triage / [place] Name Dot  Relationship To Patient Daughter  Return Phone Number [phone] (Primary)  Chief Complaint Urine, Blood In  Initial Comment caller states that her mother has blood in her urine, is on antibiotics for it.  PreDisposition Call Doctor       Nurse Assessment  Nurse: Amalia Hailey, RN, Lenna Sciara Date/Time Eilene Ghazi Time): 01/24/2015 3:18:57 PM  Confirm and document reason for call. If symptomatic, describe symptoms. ---caller states that her mother has blood in her urine, is on antibiotics for it.    Has the patient traveled out of the country within the last 30 days? ---Not Applicable    Does the patient have any new or worsening symptoms? ---Yes    Will a triage be completed? ---Yes    Related visit to physician within the last 2 weeks? ---No    Does the PT have any chronic conditions? (i.e. diabetes, asthma, etc.) ---Yes    List chronic conditions. ---CHF, hx of UTI's- last tx on the 14th., daughter believes mother might have had a kidney stone in the past    Is this a behavioral health or substance abuse call? ---No           Guidelines          Guideline Title Affirmed Question Affirmed Notes Nurse Date/Time (Eastern Time)  Urine - Blood In Blood in urine, but all triage questions negative (Exception: could be normal menstrual bleeding)    Amalia Hailey, RN, Lenna Sciara 01/24/2015 3:22:33  PM    Disp. Time Eilene Ghazi Time) Disposition Final User         01/24/2015 3:27:57 PM See Physician within 24 Hours Yes Amalia Hailey, RN, Lenna Sciara            Caller Understands: Yes  Disagree/Comply: Comply       Care Advice Given Per Guideline        SEE PHYSICIAN WITHIN 24 HOURS: SAMPLE: Bring in a sample of the bloody urine. Keep it in the refrigerator until you leave. CALL BACK IF: * You become worse.    After Care Instructions Given        Call Event Type User Date / Time Description        --------------------------------------------------------------------------------         Comments  User: Colin Ina, RN Date/Time Eilene Ghazi Time): 01/24/2015 3:33:29 PM  Caller reports her mother has been on two rounds of antibiotics for her blood in urine. Asking if someone could get infections from improper cleaning after BM, reports her mother wipes from back to front. Caller advised this is possible.

## 2015-01-24 NOTE — Telephone Encounter (Signed)
Spoke with daughter Bonnita Nasuti. I've asked her to bring mom in for lab visit only on Tuesday to collect urine specimen and if abnormal send for microscopy and UCx and will start abx. Advised to let notify me sooner over weekend (I'm on call) if new sxs like fever, dysuria, or confusion.

## 2015-01-24 NOTE — Telephone Encounter (Signed)
PLEASE NOTE: All timestamps contained within this report are represented as [place] Standard Time. CONFIDENTIALTY NOTICE: This fax transmission is intended only for the addressee. It contains information that is legally privileged, confidential or otherwise protected from use or disclosure. If you are not the intended recipient, you are strictly prohibited from reviewing, disclosing, copying using or disseminating any of this information or taking any action in reliance on or regarding this information. If you have received this fax in error, please notify us immediately by telephone so that we can arrange for its return to [name]. Phone: [phone], Toll-Free: [phone], Fax: [phone] Page: 1 of 2 Call Id: [id number] [place] Patient Name: [name] Gender: Female DOB: 23-Apr-1920 Age: 81 Y 1 M 15 D Return Phone Number: [phone] (Primary), [phone] (Secondary) Address: [place] [address] Client [facility] - Client Client Site [place], [place] Contact Type Call Call Type Triage / [place] Name Dot Relationship To Patient Daughter Appointment Disposition EMR Patient Refused Appointment Info pasted into Epic Yes Return Phone Number [phone] (Primary) Chief Complaint Urine, Blood In Initial Comment caller states that her mother has blood in her urine, is on antibiotics for it. PreDisposition Call Doctor Nurse Assessment Nurse: Amalia Hailey, RN, Lenna Sciara Date/Time Eilene Ghazi Time): 01/24/2015 3:18:57 PM Confirm and document reason for call. If symptomatic, describe symptoms. ---caller states that her mother has blood in her urine, is on antibiotics for it. Has the patient traveled out of the country within the last 30 days? ---Not Applicable Does the patient have any new or  worsening symptoms? ---Yes Will a triage be completed? ---Yes Related visit to physician within the last 2 weeks? ---No Does the PT have any chronic conditions? (i.e. diabetes, asthma, etc.) ---Yes List chronic conditions. ---CHF, hx of UTI's- last tx on the 14th., daughter believes mother might have had a kidney stone in the past Is this a behavioral health or substance abuse call? ---No Guidelines Guideline Title Affirmed Question Affirmed Notes Nurse Date/Time (Eastern Time) Urine - Blood In Blood in urine, but all triage questions negative (Exception: could be normal menstrual bleeding) Amalia Hailey, RN, Lenna Sciara 01/24/2015 3:22:33 PM Disp. Time Eilene Ghazi Time) Disposition Final User 01/24/2015 3:27:57 PM See Physician within 24 Hours Yes Amalia Hailey, RN, Melissa PLEASE NOTE: All timestamps contained within this report are represented as [place] Standard Time. CONFIDENTIALTY NOTICE: This fax transmission is intended only for the addressee. It contains information that is legally privileged, confidential or otherwise protected from use or disclosure. If you are not the intended recipient, you are strictly prohibited from reviewing, disclosing, copying using or disseminating any of this information or taking any action in reliance on or regarding this information. If you have received this fax in error, please notify us immediately by telephone so that we can arrange for its return to [name]. Phone: [phone], Toll-Free: [phone], Fax: [phone] Page: 2 of 2 Call Id: [id number] Caller Understands: Yes Disagree/Comply: Comply Care Advice Given Per Guideline SEE PHYSICIAN WITHIN 24 HOURS: SAMPLE: Bring in a sample of the bloody urine. Keep it in the refrigerator until you leave. CALL BACK IF: * You become worse. After Care Instructions Given Call Event Type User Date / Time Description Comments User: Colin Ina, RN Date/Time Eilene Ghazi Time): 01/24/2015 3:33:29 PM Caller reports her  mother has been on two rounds of antibiotics for her blood in urine. Asking if  someone could get infections from improper cleaning after BM, reports her mother wipes from back to front. Caller advised this is possible. User: Colin Ina, RN Date/Time Eilene Ghazi Time): 01/24/2015 3:41:02 PM Caller requested to call the second sister regarding this pt. Attempted to call but the call went through but no one picked up the call, discont. due to no answer on the other line. User: Colin Ina, RN Date/Time Eilene Ghazi Time): 01/24/2015 3:45:01 PM Called the caller back and reports unable to reach the other sister @ number given, confirmed this is correct. Caller advised to have pt seen in the next 24 hrs. No appts available, will have to take her to [place] reports she will wait and call the office on Tuesday did not want an appt after consulting the appts with the physician available. Referrals GO TO FACILITY UNDECIDED REFERRED TO PCP OFFICE

## 2015-01-28 DIAGNOSIS — R319 Hematuria, unspecified: Secondary | ICD-10-CM | POA: Diagnosis not present

## 2015-01-28 LAB — POCT URINALYSIS DIPSTICK
BILIRUBIN UA: NEGATIVE
GLUCOSE UA: NEGATIVE
Ketones, UA: NEGATIVE
Nitrite, UA: NEGATIVE
PH UA: 6
SPEC GRAV UA: 1.025
Urobilinogen, UA: 0.2

## 2015-01-28 LAB — URINALYSIS, MICROSCOPIC ONLY

## 2015-01-28 MED ORDER — CEPHALEXIN 500 MG PO CAPS: 500.0000 mg | ORAL_CAPSULE | Freq: Two times a day (BID) | ORAL | Status: DC

## 2015-01-28 NOTE — Addendum Note (Signed)
Addended by: Ria Bush on: 01/28/2015 05:39 PM   Modules accepted: Orders

## 2015-01-28 NOTE — Telephone Encounter (Signed)
UA abnormal. Results in chart. Sent for micro and UCx as directed.

## 2015-01-28 NOTE — Telephone Encounter (Addendum)
UA concerning for infection. Will await UCx.  plz start treatment with keflex 500mg  BID for 2 weeks. Lab Results  Component Value Date   CREATININE 1.19* 11/04/2014

## 2015-01-29 LAB — URINE CULTURE: Colony Count: 4000

## 2015-01-29 NOTE — Telephone Encounter (Signed)
Patient's daughter, Bonnita Nasuti notified and verbalized understanding.

## 2015-04-01 ENCOUNTER — Other Ambulatory Visit: Payer: Self-pay | Admitting: *Deleted

## 2015-04-01 MED ORDER — FUROSEMIDE 20 MG PO TABS: ORAL_TABLET | ORAL | Status: DC

## 2015-04-01 MED ORDER — HYDROXYZINE HCL 10 MG PO TABS: 10.0000 mg | ORAL_TABLET | Freq: Every evening | ORAL | Status: DC | PRN

## 2015-04-01 MED ORDER — AMLODIPINE BESYLATE 5 MG PO TABS: 5.0000 mg | ORAL_TABLET | Freq: Every evening | ORAL | Status: DC

## 2015-04-01 MED ORDER — LEVOTHYROXINE SODIUM 25 MCG PO TABS: ORAL_TABLET | ORAL | Status: DC

## 2015-05-30 ENCOUNTER — Other Ambulatory Visit: Payer: Self-pay | Admitting: Family Medicine

## 2015-05-30 NOTE — Telephone Encounter (Signed)
Ok to refill 

## 2015-06-24 DIAGNOSIS — E119 Type 2 diabetes mellitus without complications: Secondary | ICD-10-CM | POA: Diagnosis not present

## 2015-06-24 DIAGNOSIS — H524 Presbyopia: Secondary | ICD-10-CM | POA: Diagnosis not present

## 2015-06-24 DIAGNOSIS — H353131 Nonexudative age-related macular degeneration, bilateral, early dry stage: Secondary | ICD-10-CM | POA: Diagnosis not present

## 2015-07-08 ENCOUNTER — Encounter: Payer: Self-pay | Admitting: Family Medicine

## 2015-07-08 ENCOUNTER — Ambulatory Visit (INDEPENDENT_AMBULATORY_CARE_PROVIDER_SITE_OTHER): Payer: Medicare Other | Admitting: Family Medicine

## 2015-07-08 VITALS — BP 110/60 | HR 88 | Temp 97.8°F | Wt 118.0 lb

## 2015-07-08 DIAGNOSIS — N183 Chronic kidney disease, stage 3 unspecified: Secondary | ICD-10-CM

## 2015-07-08 DIAGNOSIS — I5032 Chronic diastolic (congestive) heart failure: Secondary | ICD-10-CM

## 2015-07-08 DIAGNOSIS — Z862 Personal history of diseases of the blood and blood-forming organs and certain disorders involving the immune mechanism: Secondary | ICD-10-CM

## 2015-07-08 DIAGNOSIS — I10 Essential (primary) hypertension: Secondary | ICD-10-CM

## 2015-07-08 DIAGNOSIS — F0390 Unspecified dementia without behavioral disturbance: Secondary | ICD-10-CM

## 2015-07-08 DIAGNOSIS — E039 Hypothyroidism, unspecified: Secondary | ICD-10-CM | POA: Diagnosis not present

## 2015-07-08 MED ORDER — AMLODIPINE BESYLATE 5 MG PO TABS: 5.0000 mg | ORAL_TABLET | Freq: Every evening | ORAL | Status: DC

## 2015-07-08 NOTE — Progress Notes (Signed)
Pre visit review using our clinic review tool, if applicable. No additional management support is needed unless otherwise documented below in the visit note. 

## 2015-07-08 NOTE — Assessment & Plan Note (Signed)
Chronic, stable. Continue current regimen. 

## 2015-07-08 NOTE — Patient Instructions (Addendum)
Try changing hydroxyzine to as needed for sleep. If we find more restless sleep or more trouble, ok to return to night.  Take prilosec as needed as well. Labs today Return as needed or in 6 months for medicare wellness visit

## 2015-07-08 NOTE — Assessment & Plan Note (Signed)
Chronic, stable. Recheck today.

## 2015-07-08 NOTE — Assessment & Plan Note (Signed)
Very lucid today. Overall stable period, no need for higher level of care. Lives at home with daughter Dot.

## 2015-07-08 NOTE — Progress Notes (Signed)
BP 110/60 mmHg  Pulse 88  Temp(Src) 97.8 F (36.6 C) (Oral)  Wt 118 lb (53.524 kg)   CC: f/u visit  Subjective:    Patient ID: [name], female    DOB: 1920/07/03, 82 y.o.   MRN: [id number]  HPI: [name] is a 82 y.o. female presenting on 07/08/2015 for Follow-up   Presents with daughter today.   No recent UTI sxs, blood in urine, dysuria, confusion.  Stays cold. Weight stable. Denies diarrhea or constipation.   overall doing well, compliant with meds.  Relevant past medical, surgical, family and social history reviewed and updated as indicated. Interim medical history since our last visit reviewed. Allergies and medications reviewed and updated. Current Outpatient Prescriptions on File Prior to Visit  Medication Sig  . acetaminophen (TYLENOL) 325 MG tablet Take 2 tablets (650 mg total) by mouth every 6 (six) hours as needed for mild pain or moderate pain (or Fever >/= 101).  Marland Kitchen aspirin 81 MG tablet Take 81 mg by mouth daily.  . Cholecalciferol (VITAMIN D3) 1000 UNITS CAPS Take by mouth 2 (two) times daily.    . ferrous sulfate 325 (65 FE) MG tablet Take 325 mg by mouth 2 (two) times daily.    . furosemide (LASIX) 20 MG tablet TAKE 1/2 TABLET BY MOUTH EVERY MORNING.  . hydrOXYzine (ATARAX/VISTARIL) 10 MG tablet Take 1 tablet (10 mg total) by mouth at bedtime as needed for anxiety.  Marland Kitchen levothyroxine (SYNTHROID, LEVOTHROID) 25 MCG tablet TAKE 1 TABLET (25 MCG TOTAL) BY MOUTH DAILY BEFORE BREAKFAST.  . Multiple Vitamins-Minerals (EYE VITAMINS & MINERALS PO) Take 1 tablet by mouth daily. [facility]  . vitamin B-12 (CYANOCOBALAMIN) 1000 MCG tablet Take 1,000 mcg by mouth daily.     No current facility-administered medications on file prior to visit.    Review of Systems Per HPI unless specifically indicated in ROS section     Objective:    BP 110/60 mmHg  Pulse 88  Temp(Src) 97.8 F (36.6 C) (Oral)  Wt 118 lb (53.524 kg)  Wt Readings from Last 3  Encounters:  07/08/15 118 lb (53.524 kg)  01/08/15 117 lb (53.071 kg)  12/31/14 121 lb 4 oz (54.999 kg)    Physical Exam  Constitutional: She appears well-developed and well-nourished. No distress.  HENT:  Mouth/Throat: Oropharynx is clear and moist. No oropharyngeal exudate.  Cardiovascular: Normal rate, regular rhythm, normal heart sounds and intact distal pulses.   No murmur heard. Pulmonary/Chest: Effort normal and breath sounds normal. No respiratory distress. She has no wheezes. She has no rales.  Musculoskeletal: She exhibits edema (1+ LLE, tr RLE).  Skin: Skin is warm and dry. No rash noted.  Psychiatric: She has a normal mood and affect.  Nursing note and vitals reviewed.  Results for orders placed or performed in visit on 01/24/15  Urine culture  Result Value Ref Range   Colony Count 4,000 COLONIES/ML    Organism ID, Bacteria Insignificant Growth   Urinalysis, microscopic only  Result Value Ref Range   WBC, UA TNTC(>50/hpf) (A) 0-2/hpf   RBC / HPF 21-50/hpf (A) 0-2/hpf   Squamous Epithelial / LPF Rare(0-4/hpf) Rare(0-4/hpf)   Bacteria, UA Few(10-50/hpf) (A) None   Ca Oxalate Crys, UA Presence of (A) None  POCT Urinalysis Dipstick  Result Value Ref Range   Color, UA Yellow    Clarity, UA Cloudy    Glucose, UA Negative    Bilirubin, UA Negative    Ketones, UA Negative  Spec Grav, UA 1.025    Blood, UA 3+    pH, UA 6.0    Protein, UA Trace    Urobilinogen, UA 0.2    Nitrite, UA Negative    Leukocytes, UA large (3+) (A) Negative      Assessment & Plan:   Problem List Items Addressed This Visit    History of pernicious anemia    Check CBC, iron panel with ferritin today.       Relevant Orders   IBC panel   Ferritin   CBC with Differential/Platelet   Essential hypertension - Primary    Chronic, stable. Continue current regimen.      Relevant Medications   amLODipine (NORVASC) 5 MG tablet   Other Relevant Orders   Renal function panel   Chronic  diastolic CHF (congestive heart failure) (HCC)    Continue low dose lasix daily. Check Cr/K today.      Relevant Medications   amLODipine (NORVASC) 5 MG tablet   CKD (chronic kidney disease), stage III    Chronic, stable. Recheck today.      Relevant Orders   IBC panel   Ferritin   Renal function panel   Hypothyroidism    Recheck today.      Relevant Orders   TSH   T4, free   Senile dementia    Very lucid today. Overall stable period, no need for higher level of care. Lives at home with daughter Dot.           Follow up plan: Return in about 6 months (around 01/07/2016), or as needed, for medicare wellness visit.  Traci Bush, MD

## 2015-07-08 NOTE — Assessment & Plan Note (Signed)
Continue low dose lasix daily. Check Cr/K today.

## 2015-07-08 NOTE — Assessment & Plan Note (Signed)
Recheck today. 

## 2015-07-08 NOTE — Assessment & Plan Note (Signed)
Check CBC, iron panel with ferritin today.

## 2015-07-09 LAB — RENAL FUNCTION PANEL
Albumin: 4.1 g/dL (ref 3.5–5.2)
BUN: 24 mg/dL — ABNORMAL HIGH (ref 6–23)
CHLORIDE: 105 meq/L (ref 96–112)
CO2: 31 meq/L (ref 19–32)
Calcium: 9.5 mg/dL (ref 8.4–10.5)
Creatinine, Ser: 1.4 mg/dL — ABNORMAL HIGH (ref 0.40–1.20)
GFR: 37.33 mL/min — AB (ref >60.00)
Glucose, Bld: 92 mg/dL (ref 70–99)
PHOSPHORUS: 3.6 mg/dL (ref 2.3–4.6)
Potassium: 4.2 mEq/L (ref 3.5–5.1)
Sodium: 142 mEq/L (ref 135–145)

## 2015-07-09 LAB — CBC WITH DIFFERENTIAL/PLATELET
Basophils Absolute: 0 10*3/uL (ref 0.0–0.1)
Basophils Relative: 0.3 % (ref 0.0–3.0)
EOS PCT: 2.5 % (ref 0.0–5.0)
Eosinophils Absolute: 0.2 10*3/uL (ref 0.0–0.7)
HCT: 39.9 % (ref 36.0–46.0)
HEMOGLOBIN: 13.4 g/dL (ref 12.0–15.0)
Lymphocytes Relative: 31.1 % (ref 12.0–46.0)
Lymphs Abs: 2.5 10*3/uL (ref 0.7–4.0)
MCHC: 33.5 g/dL (ref 30.0–36.0)
MCV: 91.8 fl (ref 78.0–100.0)
MONOS PCT: 6.1 % (ref 3.0–12.0)
Monocytes Absolute: 0.5 10*3/uL (ref 0.1–1.0)
Neutro Abs: 4.9 10*3/uL (ref 1.4–7.7)
Neutrophils Relative %: 60 % (ref 43.0–77.0)
Platelets: 160 10*3/uL (ref 150.0–400.0)
RBC: 4.35 Mil/uL (ref 3.87–5.11)
RDW: 13.8 % (ref 11.5–15.5)
WBC: 8.1 10*3/uL (ref 4.0–10.5)

## 2015-07-09 LAB — TSH: TSH: 3.32 u[IU]/mL (ref 0.35–4.50)

## 2015-07-09 LAB — IBC PANEL
IRON: 65 ug/dL (ref 42–145)
Saturation Ratios: 27.2 % (ref 20.0–50.0)
TRANSFERRIN: 171 mg/dL — AB (ref 212.0–360.0)

## 2015-07-09 LAB — T4, FREE: FREE T4: 0.83 ng/dL (ref 0.60–1.60)

## 2015-07-09 LAB — FERRITIN: Ferritin: 281.7 ng/mL (ref 10.0–291.0)

## 2015-07-11 ENCOUNTER — Ambulatory Visit: Payer: Medicare Other | Admitting: Family Medicine

## 2015-07-12 ENCOUNTER — Other Ambulatory Visit: Payer: Self-pay | Admitting: Family Medicine

## 2015-07-12 MED ORDER — FERROUS SULFATE 325 (65 FE) MG PO TABS: 325.0000 mg | ORAL_TABLET | ORAL | Status: AC

## 2015-07-14 ENCOUNTER — Encounter: Payer: Self-pay | Admitting: *Deleted

## 2015-07-15 ENCOUNTER — Ambulatory Visit: Payer: Medicare Other | Admitting: Family Medicine

## 2015-08-20 ENCOUNTER — Other Ambulatory Visit: Payer: Self-pay | Admitting: Family Medicine

## 2015-08-20 NOTE — Telephone Encounter (Signed)
Ok to refill 

## 2015-08-22 ENCOUNTER — Telehealth: Payer: Self-pay

## 2015-08-22 NOTE — Telephone Encounter (Signed)
Jeannie nurse with [facility] CHF program left v/m requesting most recent BP and P. And any recent ejection fraction. I do not see a recent ejection fraction and 07/08/15 BP was 110/60 and P 88. Info was left on v/m as instructed.

## 2015-09-20 ENCOUNTER — Other Ambulatory Visit: Payer: Self-pay | Admitting: Family Medicine

## 2015-09-21 ENCOUNTER — Other Ambulatory Visit: Payer: Self-pay | Admitting: Family Medicine

## 2015-09-24 ENCOUNTER — Ambulatory Visit (INDEPENDENT_AMBULATORY_CARE_PROVIDER_SITE_OTHER): Payer: Medicare Other | Admitting: Family Medicine

## 2015-09-24 ENCOUNTER — Encounter: Payer: Self-pay | Admitting: Family Medicine

## 2015-09-24 VITALS — BP 136/72 | HR 96 | Temp 98.6°F | Wt 117.0 lb

## 2015-09-24 DIAGNOSIS — Y92099 Unspecified place in other non-institutional residence as the place of occurrence of the external cause: Secondary | ICD-10-CM

## 2015-09-24 DIAGNOSIS — Z23 Encounter for immunization: Secondary | ICD-10-CM | POA: Diagnosis not present

## 2015-09-24 DIAGNOSIS — W19XXXA Unspecified fall, initial encounter: Secondary | ICD-10-CM | POA: Diagnosis not present

## 2015-09-24 DIAGNOSIS — Y92009 Unspecified place in unspecified non-institutional (private) residence as the place of occurrence of the external cause: Secondary | ICD-10-CM

## 2015-09-24 DIAGNOSIS — S51812A Laceration without foreign body of left forearm, initial encounter: Secondary | ICD-10-CM | POA: Diagnosis not present

## 2015-09-24 DIAGNOSIS — S51819A Laceration without foreign body of unspecified forearm, initial encounter: Secondary | ICD-10-CM | POA: Insufficient documentation

## 2015-09-24 NOTE — Assessment & Plan Note (Signed)
No signs of bony fracture or muscle injury. Pt denies any pain currently. Discussed fall prevention.

## 2015-09-24 NOTE — Progress Notes (Signed)
BP 136/72   Pulse 96   Temp 98.6 F (37 C) (Oral)   Wt 117 lb (53.1 kg)   BMI 18.88 kg/m    CC: fall with knee/hip/arm pain Subjective:    Patient ID: [name], female    DOB: Feb 02, 1920, 82 y.o.   MRN: [id number]  HPI: [name] is a 82 y.o. female presenting on 09/24/2015 for Fall (check left hip; right knee and left arm)   Last night while in bathroom fell down "lost balance". Family helped her up. EMS called out - pt declined ER visit. Bruised R upper arm, cut L forearm, hit L knee and R legs. Since then, no pain. No unilateral weakness, confusion, slurred specech. Today albe to use walker regularly.   Off hydroxyzine.   Relevant past medical, surgical, family and social history reviewed and updated as indicated. Interim medical history since our last visit reviewed. Allergies and medications reviewed and updated. Current Outpatient Prescriptions on File Prior to Visit  Medication Sig  . acetaminophen (TYLENOL) 325 MG tablet Take 2 tablets (650 mg total) by mouth every 6 (six) hours as needed for mild pain or moderate pain (or Fever >/= 101).  Marland Kitchen amLODipine (NORVASC) 5 MG tablet Take 1 tablet (5 mg total) by mouth every evening.  Marland Kitchen aspirin 81 MG tablet Take 81 mg by mouth daily.  . Cholecalciferol (VITAMIN D3) 1000 UNITS CAPS Take by mouth 2 (two) times daily.    . ferrous sulfate 325 (65 FE) MG tablet Take 1 tablet (325 mg total) by mouth every Monday, Wednesday, and Friday.  . furosemide (LASIX) 20 MG tablet TAKE 1/2 TABLET BY MOUTH EVERY MORNING.  Marland Kitchen levothyroxine (SYNTHROID, LEVOTHROID) 25 MCG tablet TAKE 1 TABLET (25 MCG TOTAL) BY MOUTH DAILY BEFORE BREAKFAST.  . Multiple Vitamins-Minerals (EYE VITAMINS & MINERALS PO) Take 1 tablet by mouth daily. [facility]  . omeprazole (PRILOSEC OTC) 20 MG tablet Take 1 tablet (20 mg total) by mouth daily as needed.  . vitamin B-12 (CYANOCOBALAMIN) 1000 MCG tablet Take 1,000 mcg by mouth daily.     No current  facility-administered medications on file prior to visit.     Review of Systems Per HPI unless specifically indicated in ROS section     Objective:    BP 136/72   Pulse 96   Temp 98.6 F (37 C) (Oral)   Wt 117 lb (53.1 kg)   BMI 18.88 kg/m   Wt Readings from Last 3 Encounters:  09/24/15 117 lb (53.1 kg)  07/08/15 118 lb (53.5 kg)  01/08/15 117 lb (53.1 kg)    Physical Exam  Constitutional: She appears well-developed and well-nourished. No distress.  Musculoskeletal: She exhibits no edema.  FROM at shoulders, elbows, knees and hips bilaterally  Skin: Skin is warm and dry. Abrasion, ecchymosis and laceration noted.  R upper arm ecchymosis L forearm skin tear laceration  Nursing note and vitals reviewed.     Assessment & Plan:   Problem List Items Addressed This Visit    Fall at home    No signs of bony fracture or muscle injury. Pt denies any pain currently. Discussed fall prevention.       Forearm laceration - Primary    Treated with skin glue. Discussed home care. No signs of infection. Discussed red flags to seek further care.        Other Visit Diagnoses   None.      Follow up plan: Return if symptoms worsen or  fail to improve.  Traci Bush, MD

## 2015-09-24 NOTE — Assessment & Plan Note (Signed)
Treated with skin glue. Discussed home care. No signs of infection. Discussed red flags to seek further care.

## 2015-09-24 NOTE — Progress Notes (Signed)
Pre visit review using our clinic review tool, if applicable. No additional management support is needed unless otherwise documented below in the visit note. 

## 2015-09-24 NOTE — Addendum Note (Signed)
Addended by: Royann Shivers A on: 09/24/2015 12:13 PM   Modules accepted: Orders

## 2015-09-24 NOTE — Patient Instructions (Addendum)
Flu shot today [name] is doing well today. Skin glue used for left forearm wound. Should fall off on its own. Watch for spreading redness or other concerning signs for infection. Good to see you today, let us know if any questions or concerns.

## 2015-09-25 ENCOUNTER — Telehealth: Payer: Self-pay

## 2015-09-25 DIAGNOSIS — M25552 Pain in left hip: Secondary | ICD-10-CM

## 2015-09-25 DIAGNOSIS — W19XXXD Unspecified fall, subsequent encounter: Secondary | ICD-10-CM

## 2015-09-25 DIAGNOSIS — Y92009 Unspecified place in unspecified non-institutional (private) residence as the place of occurrence of the external cause: Principal | ICD-10-CM

## 2015-09-25 NOTE — Telephone Encounter (Signed)
X-ray ordered.

## 2015-09-25 NOTE — Telephone Encounter (Signed)
[name] (DPR signed) pts daughter said pt seen 09/24/15; pt fell yesterday after office visit going into her house; pt fell over on rt side. Pt said she does not hurt anywhere but pts sister told [name] that pt complained to her with lt hip pain; pt sits over on rt side. Pt had hip surgery 2016 but [facility] has not called [name] back. [name] wants to know if Dr Darnell Level will order xrays at [facility]. Helen request cb.

## 2015-09-25 NOTE — Telephone Encounter (Signed)
Bonnita Nasuti notified and will bring patient in for xray.

## 2015-09-26 ENCOUNTER — Telehealth: Payer: Self-pay | Admitting: Family Medicine

## 2015-09-26 DIAGNOSIS — Z96642 Presence of left artificial hip joint: Secondary | ICD-10-CM | POA: Diagnosis not present

## 2015-09-26 DIAGNOSIS — Z471 Aftercare following joint replacement surgery: Secondary | ICD-10-CM | POA: Diagnosis not present

## 2015-09-26 NOTE — Telephone Encounter (Addendum)
Noted. Cancelled xray ordered yesterday.

## 2015-09-26 NOTE — Telephone Encounter (Signed)
Tyaja stated they were taking pt to [facility] to get an xray @ 3 today

## 2015-09-30 ENCOUNTER — Telehealth: Payer: Self-pay

## 2015-09-30 NOTE — Telephone Encounter (Signed)
PLEASE NOTE: All timestamps contained within this report are represented as [place] Standard Time. CONFIDENTIALTY NOTICE: This fax transmission is intended only for the addressee. It contains information that is legally privileged, confidential or otherwise protected from use or disclosure. If you are not the intended recipient, you are strictly prohibited from reviewing, disclosing, copying using or disseminating any of this information or taking any action in reliance on or regarding this information. If you have received this fax in error, please notify us immediately by telephone so that we can arrange for its return to [name]. Phone: [phone], Toll-Free: [phone], Fax: [phone] Page: 1 of 2 Call Id: [id number] [place] Patient Name: [name] Gender: Female DOB: Feb 02, 1920 Age: 82 Y 9 M 17 D Return Phone Number: [phone] (Primary) Address: City/State/Zip: [place] Client [facility] Night - Client Client Site [place] Physician Ria Bush - MD Contact Type Call Who Is Calling Patient / Member / Family / Caregiver Call Type Triage / Clinical Caller Name Dot Relationship To Patient Daughter Return Phone Number [phone] (Primary) Chief Complaint Cough Reason for Call Symptomatic / Request for [place] states mother had a cough for a couple weeks. Dr. Darnell Level. gave her Benzonatate 100 mg, which she has been taking for a couple weeks. It is not helping. They have given her Benadryl in the past. Caller wants to know if they can alternate Benzonatate with Benadryl. PreDisposition InappropriateToAsk Translation No Nurse Assessment Nurse: Donovan Kail, RN, Barnetta Chapel Date/Time (Eastern Time): 09/26/2015 4:52:48 PM Confirm and document reason for call. If symptomatic, describe symptoms. You must  click the next button to save text entered. ---Caller states mother had a cough for a couple weeks. Dr. Darnell Level. gave her Benzonatate 100 mg, which she has been taking for a couple weeks. It is not helping. They have given her Benadryl in the past. Caller wants to know if they can alternate Benzonatate with Benadryl. Cough is hacky cough. Temp 100.8. Has the patient traveled out of the country within the last 30 days? ---Not Applicable Does the patient have any new or worsening symptoms? ---Yes Will a triage be completed? ---Yes Related visit to physician within the last 2 weeks? ---Yes Does the PT have any chronic conditions? (i.e. diabetes, asthma, etc.) ---Yes List chronic conditions. ---hip surgery, falls, arthritis, Is this a behavioral health or substance abuse call? ---No Guidelines Guideline Title Affirmed Question Affirmed Notes Nurse Date/Time (Eastern Time) Cough - Acute Productive Cough with cold symptoms (e.g., runny nose, postnasal drip, Donovan Kail RN, Barnetta Chapel 09/26/2015 4:57:03 PM PLEASE NOTE: All timestamps contained within this report are represented as [place] Standard Time. CONFIDENTIALTY NOTICE: This fax transmission is intended only for the addressee. It contains information that is legally privileged, confidential or otherwise protected from use or disclosure. If you are not the intended recipient, you are strictly prohibited from reviewing, disclosing, copying using or disseminating any of this information or taking any action in reliance on or regarding this information. If you have received this fax in error, please notify us immediately by telephone so that we can arrange for its return to [name]. Phone: [phone], Toll-Free: [phone], Fax: [phone] Page: 2 of 2 Call Id: [id number] Guidelines Guideline Title Affirmed Question Affirmed Notes Nurse Date/Time Eilene Ghazi Time) throat clearing) (all triage questions negative) Disp. Time Eilene Ghazi Time)  Disposition Final User 09/26/2015 5:03:00 PM Home Care Yes Donovan Kail, RN,  Ledell Noss Understands: Yes Disagree/Comply: Comply Care Advice Given Per Guideline HOME CARE: You should be able to treat this at home. - HOME REMEDY - HONEY: This old home remedy has been shown to help decrease coughing at night. The adult dosage is 2 teaspoons (10 ml) at bedtime. Honey should not be given to infants under one year of age. HOW TO MAKE SALINE (SALT WATER) NASAL WASH: * You can make your own saline nasal wash. * Add 1/2 tsp of table salt to 1 cup (8 oz; 240 ml) of warm water. * You should use sterile, distilled, or previously boiled water for nasal irrigation. CALL BACK IF: * You become worse. CARE ADVICE given per Cough - Acute Productive (Adult) guideline. * Fever lasts over 3 days * Nasal discharge lasts over 10 days * Earache or facial pain develops FOR A STUFFY NOSE - USE NASAL WASHES: * Introduction: Saline (salt water) nasal irrigation (nasal wash) is an effective and simple home remedy for treating stuffy nose and sinus congestion. The nose can be irrigated by pouring, spraying, or squirting salt water into the nose and then letting it run back out. * How it Helps: The salt water rinses out excess mucus, washes out any irritants (dust, allergens) that might be present, and moistens the nasal cavity. * Methods: There are several ways to perform nasal irrigation. You can use a saline nasal spray bottle (available over-the-counter), a rubber ear syringe, a medical syringe without the needle, or a NETI POT. STEPBY- STEP INSTRUCTIONS: * STEP 1: Lean over a sink. * STEP 2: Gently squirt or spray warm salt water into one of your nostrils. * STEP 3: Some of the water may run into the back of your throat. Spit this out. If you swallow the salt water it will not hurt you. * STEP 4: Blow your nose to clean out the water and mucus. * STEP 5: Repeat steps 1-4 for the other nostril. You can do this a  couple times a day if it seems to help you.

## 2015-10-02 ENCOUNTER — Ambulatory Visit (INDEPENDENT_AMBULATORY_CARE_PROVIDER_SITE_OTHER)
Admission: RE | Admit: 2015-10-02 | Discharge: 2015-10-02 | Disposition: A | Discharge status: Home / Self Care | Payer: Medicare Other | Source: Ambulatory Visit | Attending: Family Medicine | Admitting: Family Medicine

## 2015-10-02 ENCOUNTER — Encounter: Payer: Self-pay | Admitting: Family Medicine

## 2015-10-02 ENCOUNTER — Ambulatory Visit (INDEPENDENT_AMBULATORY_CARE_PROVIDER_SITE_OTHER): Payer: Medicare Other | Admitting: Family Medicine

## 2015-10-02 VITALS — BP 128/68 | HR 86 | Temp 98.5°F | Wt 115.5 lb

## 2015-10-02 DIAGNOSIS — J181 Lobar pneumonia, unspecified organism: Principal | ICD-10-CM

## 2015-10-02 DIAGNOSIS — R05 Cough: Secondary | ICD-10-CM

## 2015-10-02 DIAGNOSIS — J189 Pneumonia, unspecified organism: Secondary | ICD-10-CM | POA: Diagnosis not present

## 2015-10-02 DIAGNOSIS — R059 Cough, unspecified: Secondary | ICD-10-CM

## 2015-10-02 MED ORDER — AZITHROMYCIN 250 MG PO TABS: ORAL_TABLET | ORAL | 0 refills | Status: DC

## 2015-10-02 MED ORDER — CEFTRIAXONE SODIUM 1 G IJ SOLR
1.0000 g | Freq: Once | INTRAMUSCULAR | Status: AC
  Administered 2015-10-02: 1 g via INTRAMUSCULAR

## 2015-10-02 NOTE — Patient Instructions (Addendum)
I think you have pneumonia - treat with zpack sent to pharmacy. rocephin shot today.  Let us know if not improving with treatment.  Good to see you today, call us with questions.   Community-Acquired Pneumonia, Adult Pneumonia is an infection of the lungs. There are different types of pneumonia. One type can develop while a person is in a hospital. A different type, called community-acquired pneumonia, develops in people who are not, or have not recently been, in the hospital or other health care facility.  CAUSES Pneumonia may be caused by bacteria, viruses, or funguses. Community-acquired pneumonia is often caused by Streptococcus pneumonia bacteria. These bacteria are often passed from one person to another by breathing in droplets from the cough or sneeze of an infected person. RISK FACTORS The condition is more likely to develop in:  People who havechronic diseases, such as chronic obstructive pulmonary disease (COPD), asthma, congestive heart failure, cystic fibrosis, diabetes, or kidney disease.  People who haveearly-stage or late-stage HIV.  People who havesickle cell disease.  People who havehad their spleen removed (splenectomy).  People who havepoor Human resources officer.  People who havemedical conditions that increase the risk of breathing in (aspirating) secretions their own mouth and nose.   People who havea weakened immune system (immunocompromised).  People who smoke.  People whotravel to areas where pneumonia-causing germs commonly exist.  People whoare around animal habitats or animals that have pneumonia-causing germs, including birds, bats, rabbits, cats, and farm animals. SYMPTOMS Symptoms of this condition include:  Adry cough.  A wet (productive) cough.  Fever.  Sweating.  Chest pain, especially when breathing deeply or coughing.  Rapid breathing or difficulty breathing.  Shortness of breath.  Shaking chills.  Fatigue.  Muscle  aches. DIAGNOSIS Your health care provider will take a medical history and perform a physical exam. You may also have other tests, including:  Imaging studies of your chest, including X-rays.  Tests to check your blood oxygen level and other blood gases.  Other tests on blood, mucus (sputum), fluid around your lungs (pleural fluid), and urine. If your pneumonia is severe, other tests may be done to identify the specific cause of your illness. TREATMENT The type of treatment that you receive depends on many factors, such as the cause of your pneumonia, the medicines you take, and other medical conditions that you have. For most adults, treatment and recovery from pneumonia may occur at home. In some cases, treatment must happen in a hospital. Treatment may include:  Antibiotic medicines, if the pneumonia was caused by bacteria.  Antiviral medicines, if the pneumonia was caused by a virus.  Medicines that are given by mouth or through an IV tube.  Oxygen.  Respiratory therapy. Although rare, treating severe pneumonia may include:  Mechanical ventilation. This is done if you are not breathing well on your own and you cannot maintain a safe blood oxygen level.  Thoracentesis. This procedureremoves fluid around one lung or both lungs to help you breathe better. HOME CARE INSTRUCTIONS  Take over-the-counter and prescription medicines only as told by your health care provider.  Only takecough medicine if you are losing sleep. Understand that cough medicine can prevent your body's natural ability to remove mucus from your lungs.  If you were prescribed an antibiotic medicine, take it as told by your health care provider. Do not stop taking the antibiotic even if you start to feel better.  Sleep in a semi-upright position at night. Try sleeping in a reclining chair, or place  a few pillows under your head.  Do not use tobacco products, including cigarettes, chewing tobacco, and  e-cigarettes. If you need help quitting, ask your health care provider.  Drink enough water to keep your urine clear or pale yellow. This will help to thin out mucus secretions in your lungs. PREVENTION There are ways that you can decrease your risk of developing community-acquired pneumonia. Consider getting a pneumococcal vaccine if:  You are older than 80 years of age.  You are older than 80 years of age and are undergoing cancer treatment, have chronic lung disease, or have other medical conditions that affect your immune system. Ask your health care provider if this applies to you. There are different types and schedules of pneumococcal vaccines. Ask your health care provider which vaccination option is best for you. You may also prevent community-acquired pneumonia if you take these actions:  Get an influenza vaccine every year. Ask your health care provider which type of influenza vaccine is best for you.  Go to the dentist on a regular basis.  Wash your hands often. Use hand sanitizer if soap and water are not available. SEEK MEDICAL CARE IF:  You have a fever.  You are losing sleep because you cannot control your cough with cough medicine. SEEK IMMEDIATE MEDICAL CARE IF:  You have worsening shortness of breath.  You have increased chest pain.  Your sickness becomes worse, especially if you are an older adult or have a weakened immune system.  You cough up blood.   This information is not intended to replace advice given to you by your health care provider. Make sure you discuss any questions you have with your health care provider.   Document Released: 01/11/2005 Document Revised: 10/02/2014 Document Reviewed: 05/08/2014 Elsevier Interactive Patient Education [employer].

## 2015-10-02 NOTE — Assessment & Plan Note (Addendum)
Xray today - concern for RLL pneumonia in frail elderly - cover with rocephin 1gm and zpack antibiotic. Update if not improving with treatment. Pt and daughter agree with plan.

## 2015-10-02 NOTE — Addendum Note (Signed)
Addended by: Royann Shivers A on: 10/02/2015 05:35 PM   Modules accepted: Orders

## 2015-10-02 NOTE — Progress Notes (Signed)
BP 128/68   Pulse 86   Temp 98.5 F (36.9 C) (Oral)   Wt 115 lb 8 oz (52.4 kg)   SpO2 91%   BMI 18.64 kg/m    CC: cough Subjective:    Patient ID: [name], female    DOB: [birth year], 82 y.o.   MRN: [id number]  HPI: [name] is a 82 y.o. female presenting on 10/02/2015 for Cough   Here with daughter. Concerned because of increased malaise, lack of appetite, mild nausea, lack of interest in puzzle. No confusion, fevers/chills, abd pain, vomiting, diarrhea. Noticing increased productive cough without significant congestion. No ST. Sleeping well.   Tried corcedin and delsym but hasn't helped.  No noted choking episodes.   Relevant past medical, surgical, family and social history reviewed and updated as indicated. Interim medical history since our last visit reviewed. Allergies and medications reviewed and updated. Current Outpatient Prescriptions on File Prior to Visit  Medication Sig  . acetaminophen (TYLENOL) 325 MG tablet Take 2 tablets (650 mg total) by mouth every 6 (six) hours as needed for mild pain or moderate pain (or Fever >/= 101).  Marland Kitchen amLODipine (NORVASC) 5 MG tablet Take 1 tablet (5 mg total) by mouth every evening.  Marland Kitchen aspirin 81 MG tablet Take 81 mg by mouth daily.  . Cholecalciferol (VITAMIN D3) 1000 UNITS CAPS Take by mouth 2 (two) times daily.    . ferrous sulfate 325 (65 FE) MG tablet Take 1 tablet (325 mg total) by mouth every Monday, Wednesday, and Friday.  . furosemide (LASIX) 20 MG tablet TAKE 1/2 TABLET BY MOUTH EVERY MORNING.  Marland Kitchen levothyroxine (SYNTHROID, LEVOTHROID) 25 MCG tablet TAKE 1 TABLET (25 MCG TOTAL) BY MOUTH DAILY BEFORE BREAKFAST.  . Multiple Vitamins-Minerals (EYE VITAMINS & MINERALS PO) Take 1 tablet by mouth daily. [facility]  . omeprazole (PRILOSEC OTC) 20 MG tablet Take 1 tablet (20 mg total) by mouth daily as needed.  . vitamin B-12 (CYANOCOBALAMIN) 1000 MCG tablet Take 1,000 mcg by mouth daily.     No current  facility-administered medications on file prior to visit.     Review of Systems Per HPI unless specifically indicated in ROS section     Objective:    BP 128/68   Pulse 86   Temp 98.5 F (36.9 C) (Oral)   Wt 115 lb 8 oz (52.4 kg)   SpO2 91%   BMI 18.64 kg/m   Wt Readings from Last 3 Encounters:  10/02/15 115 lb 8 oz (52.4 kg)  09/24/15 117 lb (53.1 kg)  07/08/15 118 lb (53.5 kg)    Physical Exam  Constitutional: She appears well-developed and well-nourished. No distress.  HENT:  Mouth/Throat: Oropharynx is clear and moist. No oropharyngeal exudate.  Cardiovascular: Normal rate, regular rhythm, normal heart sounds and intact distal pulses.   No murmur heard. Pulmonary/Chest: Effort normal. No respiratory distress. She has no wheezes. She has rhonchi. She has rales (R>L base).  Musculoskeletal: She exhibits no edema.  Skin: Skin is warm and dry. No rash noted.  Nursing note and vitals reviewed.  Results for orders placed or performed in visit on 07/08/15  IBC panel  Result Value Ref Range   Iron 65 42 - 145 ug/dL   Transferrin 171.0 (L) 212.0 - 360.0 mg/dL   Saturation Ratios 27.2 20.0 - 50.0 %  Ferritin  Result Value Ref Range   Ferritin 281.7 10.0 - 291.0 ng/mL  Renal function panel  Result Value Ref Range  Sodium 142 135 - 145 mEq/L   Potassium 4.2 3.5 - 5.1 mEq/L   Chloride 105 96 - 112 mEq/L   CO2 31 19 - 32 mEq/L   Calcium 9.5 8.4 - 10.5 mg/dL   Albumin 4.1 3.5 - 5.2 g/dL   BUN 24 (H) 6 - 23 mg/dL   Creatinine, Ser 1.40 (H) 0.40 - 1.20 mg/dL   Glucose, Bld 92 70 - 99 mg/dL   Phosphorus 3.6 2.3 - 4.6 mg/dL   GFR 37.33 (L) >60.00 mL/min  TSH  Result Value Ref Range   TSH 3.32 0.35 - 4.50 uIU/mL  T4, free  Result Value Ref Range   Free T4 0.83 0.60 - 1.60 ng/dL  CBC with Differential/Platelet  Result Value Ref Range   WBC 8.1 4.0 - 10.5 K/uL   RBC 4.35 3.87 - 5.11 Mil/uL   Hemoglobin 13.4 12.0 - 15.0 g/dL   HCT 39.9 36.0 - 46.0 %   MCV 91.8 78.0 -  100.0 fl   MCHC 33.5 30.0 - 36.0 g/dL   RDW 13.8 11.5 - 15.5 %   Platelets 160.0 150.0 - 400.0 K/uL   Neutrophils Relative % 60.0 43.0 - 77.0 %   Lymphocytes Relative 31.1 12.0 - 46.0 %   Monocytes Relative 6.1 3.0 - 12.0 %   Eosinophils Relative 2.5 0.0 - 5.0 %   Basophils Relative 0.3 0.0 - 3.0 %   Neutro Abs 4.9 1.4 - 7.7 K/uL   Lymphs Abs 2.5 0.7 - 4.0 K/uL   Monocytes Absolute 0.5 0.1 - 1.0 K/uL   Eosinophils Absolute 0.2 0.0 - 0.7 K/uL   Basophils Absolute 0.0 0.0 - 0.1 K/uL      Assessment & Plan:   Problem List Items Addressed This Visit    Right lower lobe pneumonia - Primary    Xray today - concern for RLL pneumonia in frail elderly - cover with rocephin 1gm and zpack antibiotic. Update if not improving with treatment. Pt and daughter agree with plan.      Relevant Orders   DG Chest 2 View    Other Visit Diagnoses   None.      Follow up plan: Return if symptoms worsen or fail to improve.  Traci Bush, MD

## 2015-10-02 NOTE — Progress Notes (Signed)
Pre visit review using our clinic review tool, if applicable. No additional management support is needed unless otherwise documented below in the visit note. 

## 2015-10-03 ENCOUNTER — Telehealth: Payer: Self-pay

## 2015-10-03 NOTE — Telephone Encounter (Signed)
Matthew Saras (DPR signed) left v/m requesting cb about med given at 10/02/15 visit. I spoke with Dot and she said Dr Darnell Level advised since pt got abx shot on 10/02/15 not to start azithromycin until 10/03/15. Dot wanted to verify instructions for taking azithromycin; reviewed per med order and dot voiced understanding.

## 2015-10-13 ENCOUNTER — Encounter: Payer: Self-pay | Admitting: Internal Medicine

## 2015-10-13 ENCOUNTER — Ambulatory Visit (INDEPENDENT_AMBULATORY_CARE_PROVIDER_SITE_OTHER): Payer: Medicare Other | Admitting: Internal Medicine

## 2015-10-13 DIAGNOSIS — R05 Cough: Secondary | ICD-10-CM | POA: Diagnosis not present

## 2015-10-13 DIAGNOSIS — J181 Lobar pneumonia, unspecified organism: Secondary | ICD-10-CM | POA: Diagnosis not present

## 2015-10-13 MED ORDER — LEVOFLOXACIN 250 MG PO TABS: 250.0000 mg | ORAL_TABLET | Freq: Every day | ORAL | 0 refills | Status: DC

## 2015-10-13 NOTE — Progress Notes (Signed)
Subjective:    Patient ID: [name], female    DOB: 07/08/1920, 82 y.o.   MRN: [id number]  HPI Here due to ongoing respiratory symptoms Here daughter Dot  Still having a lot of cough--but it is better Still coughing in Am and in evening mostly Does feel better after the antibiotic No fever Still sleeps propped up-- but not SOB  Daughter helps with bath No problems with bathroom--- not tiring out or DOE with this Walks with walker  Current Outpatient Prescriptions on File Prior to Visit  Medication Sig Dispense Refill  . acetaminophen (TYLENOL) 325 MG tablet Take 2 tablets (650 mg total) by mouth every 6 (six) hours as needed for mild pain or moderate pain (or Fever >/= 101). 80 tablet 0  . amLODipine (NORVASC) 5 MG tablet Take 1 tablet (5 mg total) by mouth every evening. 90 tablet 3  . aspirin 81 MG tablet Take 81 mg by mouth daily.    . Cholecalciferol (VITAMIN D3) 1000 UNITS CAPS Take by mouth 2 (two) times daily.      . ferrous sulfate 325 (65 FE) MG tablet Take 1 tablet (325 mg total) by mouth every Monday, Wednesday, and Friday.    . furosemide (LASIX) 20 MG tablet TAKE 1/2 TABLET BY MOUTH EVERY MORNING. 45 tablet 2  . levothyroxine (SYNTHROID, LEVOTHROID) 25 MCG tablet TAKE 1 TABLET (25 MCG TOTAL) BY MOUTH DAILY BEFORE BREAKFAST. 30 tablet 3  . Multiple Vitamins-Minerals (EYE VITAMINS & MINERALS PO) Take 1 tablet by mouth daily. [facility]    . omeprazole (PRILOSEC OTC) 20 MG tablet Take 1 tablet (20 mg total) by mouth daily as needed.    . vitamin B-12 (CYANOCOBALAMIN) 1000 MCG tablet Take 1,000 mcg by mouth daily.       No current facility-administered medications on file prior to visit.     No Known Allergies  Past Medical History:  Diagnosis Date  . Acute hemorrhagic cystitis 12/04/2014  . Anemia 12/30-01/25/2006   MCH- CHF- started lasix  . CKD (chronic kidney disease), stage III 03/04/2010  . COPD (chronic obstructive pulmonary disease) ([place]) 02/2014   by CXR and VQ scan  . Diastolic CHF ([place])   . Gross hematuria 12/31/2014  . History of colon cancer    s/p partial colectomy  . History of ETT 03/15/2006   adenosine myoview low risk  . Hyperlipidemia 12/2002  . Hypertension 10/1998  . Hypothyroidism   . Microhematuria 03/2013   watchful waiting ([place])  . Vaginal delivery    x4, at home    Past Surgical History:  Procedure Laterality Date  . CHOLECYSTECTOMY  1984  . COLONOSCOPY  03/2010   1 tubular adenoma, int hemorrhoids, f/u prn [name]  . CT HEAD LIMITED W/O CM  02/2011   no acute process.  Mild to moderate cortical volume loss and scattered small vessel ischemic microangiopathy  . ESOPHAGOGASTRODUODENOSCOPY     B9 polyp H.Pylori bx- "dilated" polyps  . HIP ARTHROPLASTY Left 02/01/2014   hemiarthroplasty s/p fem fracture after fall [name], MD)  . History of Abd  02/25/2006   bilat renal cysts, no mets  . PARTIAL COLECTOMY  03/25/2006   right    Family History  Problem Relation Age of Onset  . Stroke Sister   . Diabetes Sister   . Hypertension Sister   . Stroke Mother   . Alcohol abuse Brother     Social History   Social History  . Marital status:  Widowed    Spouse name: N/A  . Number of children: 3  . Years of education: N/A   Occupational History  . retired     [employer], 45 years   Social History Main Topics  . Smoking status: Never Smoker  . Smokeless tobacco: Never Used  . Alcohol use No  . Drug use: No  . Sexual activity: No   Other Topics Concern  . Not on file   Social History Narrative   Married, widow 68    Lives with youngest daughter and her husband and youngest son, other daughter lives next door   Occupation: Licensed conveyancer  15yrs   Edu: some HS   Retired      Advanced directives: would want HCPOA to be any of her children. Does not want prolonged life support, does not want feeding tube or breathing tube. Discussed advanced directive. Has not set up.   Review of  Systems Appetite is variable No night sweats or chills    Objective:   Physical Exam  Constitutional: She appears well-developed. No distress.  Neck: Normal range of motion. Neck supple.  Cardiovascular: Normal rate, regular rhythm and normal heart sounds.  Exam reveals no gallop.   No murmur heard. Pulmonary/Chest: Effort normal. No respiratory distress. She has no wheezes.  Very slight crackles actually at left base Right is clear  Musculoskeletal: She exhibits no edema.  Lymphadenopathy:    She has no cervical adenopathy.          Assessment & Plan:

## 2015-10-13 NOTE — Assessment & Plan Note (Signed)
Seems better but may have slipped a little since finishing the azithromycin She is not a complainer RR 24 No loss of function at home Discussed the uncertainty with daughter Will give a few days of levofloxacin just in case

## 2015-10-13 NOTE — Progress Notes (Signed)
Pre visit review using our clinic review tool, if applicable. No additional management support is needed unless otherwise documented below in the visit note. 

## 2016-01-05 ENCOUNTER — Other Ambulatory Visit: Payer: Self-pay | Admitting: Family Medicine

## 2016-01-06 ENCOUNTER — Ambulatory Visit: Payer: Medicare Other | Admitting: Family Medicine

## 2016-01-08 ENCOUNTER — Telehealth: Payer: Self-pay | Admitting: Family Medicine

## 2016-01-08 NOTE — Telephone Encounter (Signed)
Jenny,[employer],called.  Patient has a 6 mth follow up tomorrow.  Sonia Baller said she's due for her wellness exam and wanted to make sure when patient leaves,she makes her appointment for a wellness exam.

## 2016-01-09 ENCOUNTER — Ambulatory Visit (INDEPENDENT_AMBULATORY_CARE_PROVIDER_SITE_OTHER): Payer: Medicare Other | Admitting: Family Medicine

## 2016-01-09 ENCOUNTER — Encounter: Payer: Self-pay | Admitting: Family Medicine

## 2016-01-09 VITALS — BP 118/60 | HR 88 | Temp 97.5°F | Wt 113.8 lb

## 2016-01-09 DIAGNOSIS — E039 Hypothyroidism, unspecified: Secondary | ICD-10-CM

## 2016-01-09 DIAGNOSIS — I10 Essential (primary) hypertension: Secondary | ICD-10-CM

## 2016-01-09 DIAGNOSIS — N183 Chronic kidney disease, stage 3 unspecified: Secondary | ICD-10-CM

## 2016-01-09 DIAGNOSIS — F0390 Unspecified dementia without behavioral disturbance: Secondary | ICD-10-CM | POA: Diagnosis not present

## 2016-01-09 LAB — RENAL FUNCTION PANEL
Albumin: 4.1 g/dL (ref 3.5–5.2)
BUN: 22 mg/dL (ref 6–23)
CHLORIDE: 106 meq/L (ref 96–112)
CO2: 32 mEq/L (ref 19–32)
CREATININE: 1.22 mg/dL — AB (ref 0.40–1.20)
Calcium: 9.8 mg/dL (ref 8.4–10.5)
GFR: 43.71 mL/min — AB (ref >60.00)
Glucose, Bld: 91 mg/dL (ref 70–99)
PHOSPHORUS: 3.5 mg/dL (ref 2.3–4.6)
Potassium: 4.1 mEq/L (ref 3.5–5.1)
SODIUM: 143 meq/L (ref 135–145)

## 2016-01-09 LAB — TSH: TSH: 3.63 u[IU]/mL (ref 0.35–4.50)

## 2016-01-09 NOTE — Progress Notes (Signed)
Pre visit review using our clinic review tool, if applicable. No additional management support is needed unless otherwise documented below in the visit note. 

## 2016-01-09 NOTE — Assessment & Plan Note (Signed)
Update TSH

## 2016-01-09 NOTE — Assessment & Plan Note (Signed)
Chronic, stable. Continue amlodipine.  

## 2016-01-09 NOTE — Progress Notes (Signed)
BP 118/60   Pulse 88   Temp 97.5 F (36.4 C) (Oral)   Wt 113 lb 12 oz (51.6 kg)   BMI 18.36 kg/m    CC: 3 mo f/u visit Subjective:    Patient ID: [name], female    DOB: 03-10-1920, 82 y.o.   MRN: [id number]  HPI: [name] is a 82 y.o. female presenting on 01/09/2016 for Follow-up   Recent PNA treatment x 2. sxs largely resolved. Doing well at home. Tolerating meds well. No concerns today. Upset she had to wait to be seen. Due for medicare wellness visit.   Relevant past medical, surgical, family and social history reviewed and updated as indicated. Interim medical history since our last visit reviewed. Allergies and medications reviewed and updated. Current Outpatient Prescriptions on File Prior to Visit  Medication Sig  . acetaminophen (TYLENOL) 325 MG tablet Take 2 tablets (650 mg total) by mouth every 6 (six) hours as needed for mild pain or moderate pain (or Fever >/= 101).  Marland Kitchen amLODipine (NORVASC) 5 MG tablet Take 1 tablet (5 mg total) by mouth every evening.  Marland Kitchen aspirin 81 MG tablet Take 81 mg by mouth daily.  . Cholecalciferol (VITAMIN D3) 1000 UNITS CAPS Take by mouth 2 (two) times daily.    . ferrous sulfate 325 (65 FE) MG tablet Take 1 tablet (325 mg total) by mouth every Monday, Wednesday, and Friday.  . furosemide (LASIX) 20 MG tablet TAKE 1/2 TABLET BY MOUTH EVERY MORNING.  Marland Kitchen levothyroxine (SYNTHROID, LEVOTHROID) 25 MCG tablet TAKE 1 TABLET (25 MCG TOTAL) BY MOUTH DAILY BEFORE BREAKFAST.  . Multiple Vitamins-Minerals (EYE VITAMINS & MINERALS PO) Take 1 tablet by mouth daily. [facility]  . vitamin B-12 (CYANOCOBALAMIN) 1000 MCG tablet Take 1,000 mcg by mouth daily.    Marland Kitchen omeprazole (PRILOSEC OTC) 20 MG tablet Take 1 tablet (20 mg total) by mouth daily as needed.   No current facility-administered medications on file prior to visit.     Review of Systems Per HPI unless specifically indicated in ROS section     Objective:    BP 118/60   Pulse  88   Temp 97.5 F (36.4 C) (Oral)   Wt 113 lb 12 oz (51.6 kg)   BMI 18.36 kg/m   Wt Readings from Last 3 Encounters:  01/09/16 113 lb 12 oz (51.6 kg)  10/13/15 116 lb (52.6 kg)  10/02/15 115 lb 8 oz (52.4 kg)    Physical Exam  Constitutional: She appears well-developed and well-nourished. No distress.  HENT:  Mouth/Throat: Oropharynx is clear and moist. No oropharyngeal exudate.  Cardiovascular: Normal rate, regular rhythm, normal heart sounds and intact distal pulses.   No murmur heard. Pulmonary/Chest: Effort normal and breath sounds normal. No respiratory distress. She has no wheezes. She has no rales.  Musculoskeletal: She exhibits no edema.  Psychiatric: She has a normal mood and affect.  Nursing note and vitals reviewed.  Results for orders placed or performed in visit on 07/08/15  IBC panel  Result Value Ref Range   Iron 65 42 - 145 ug/dL   Transferrin 171.0 (L) 212.0 - 360.0 mg/dL   Saturation Ratios 27.2 20.0 - 50.0 %  Ferritin  Result Value Ref Range   Ferritin 281.7 10.0 - 291.0 ng/mL  Renal function panel  Result Value Ref Range   Sodium 142 135 - 145 mEq/L   Potassium 4.2 3.5 - 5.1 mEq/L   Chloride 105 96 - 112 mEq/L  CO2 31 19 - 32 mEq/L   Calcium 9.5 8.4 - 10.5 mg/dL   Albumin 4.1 3.5 - 5.2 g/dL   BUN 24 (H) 6 - 23 mg/dL   Creatinine, Ser 1.40 (H) 0.40 - 1.20 mg/dL   Glucose, Bld 92 70 - 99 mg/dL   Phosphorus 3.6 2.3 - 4.6 mg/dL   GFR 37.33 (L) >60.00 mL/min  TSH  Result Value Ref Range   TSH 3.32 0.35 - 4.50 uIU/mL  T4, free  Result Value Ref Range   Free T4 0.83 0.60 - 1.60 ng/dL  CBC with Differential/Platelet  Result Value Ref Range   WBC 8.1 4.0 - 10.5 K/uL   RBC 4.35 3.87 - 5.11 Mil/uL   Hemoglobin 13.4 12.0 - 15.0 g/dL   HCT 39.9 36.0 - 46.0 %   MCV 91.8 78.0 - 100.0 fl   MCHC 33.5 30.0 - 36.0 g/dL   RDW 13.8 11.5 - 15.5 %   Platelets 160.0 150.0 - 400.0 K/uL   Neutrophils Relative % 60.0 43.0 - 77.0 %   Lymphocytes Relative 31.1  12.0 - 46.0 %   Monocytes Relative 6.1 3.0 - 12.0 %   Eosinophils Relative 2.5 0.0 - 5.0 %   Basophils Relative 0.3 0.0 - 3.0 %   Neutro Abs 4.9 1.4 - 7.7 K/uL   Lymphs Abs 2.5 0.7 - 4.0 K/uL   Monocytes Absolute 0.5 0.1 - 1.0 K/uL   Eosinophils Absolute 0.2 0.0 - 0.7 K/uL   Basophils Absolute 0.0 0.0 - 0.1 K/uL      Assessment & Plan:   Problem List Items Addressed This Visit    CKD (chronic kidney disease), stage III    Update Cr. Great blood pressure control recently.       Relevant Orders   Renal function panel   Essential hypertension - Primary    Chronic, stable. Continue amlodipine.      Hypothyroidism    Update TSH      Relevant Orders   TSH   Senile dementia    No change in functional status noted recently.           Follow up plan: Return in about 3 months (around 04/08/2016) for follow up visit.  Traci Bush, MD

## 2016-01-09 NOTE — Patient Instructions (Addendum)
See if we can schedule wellness visit with [name] this year, if not, return for physical in 3-4 months.  Labs today.

## 2016-01-09 NOTE — Assessment & Plan Note (Signed)
No change in functional status noted recently.

## 2016-01-09 NOTE — Assessment & Plan Note (Signed)
Update Cr. Great blood pressure control recently.

## 2016-01-09 NOTE — Telephone Encounter (Signed)
Pt seen today, I encouraged her to schedule AMW.

## 2016-01-15 IMAGING — CR DG HIP (WITH OR WITHOUT PELVIS) 2-3V*L*
3 series · 3 of 3 positions shown · non-contrast
Comparison: None available for comparison at time of study
interpretation.

CLINICAL DATA: Lost balance and fell today at home while taking of
shoes. LEFT hip pain.

EXAM:
DG HIP W/ PELVIS 2-3V*L*

[x pelvis (1 of 2)]
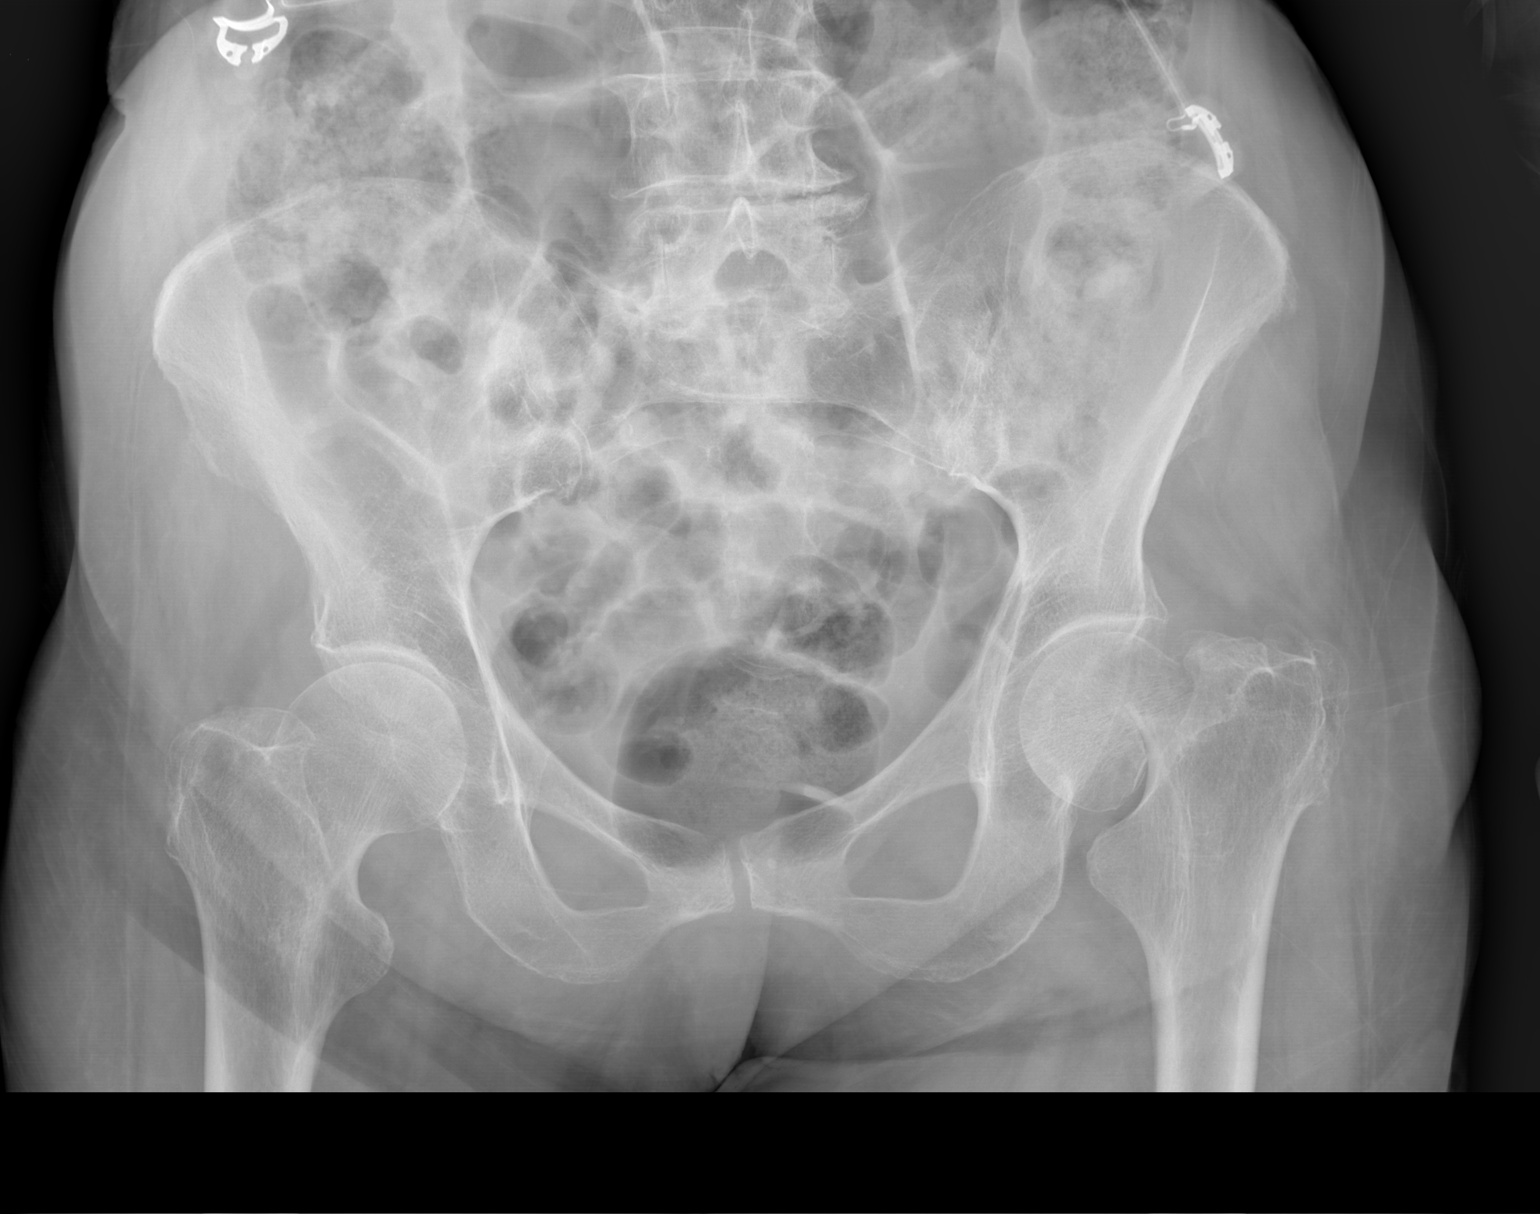

[x pelvis (2 of 2)]
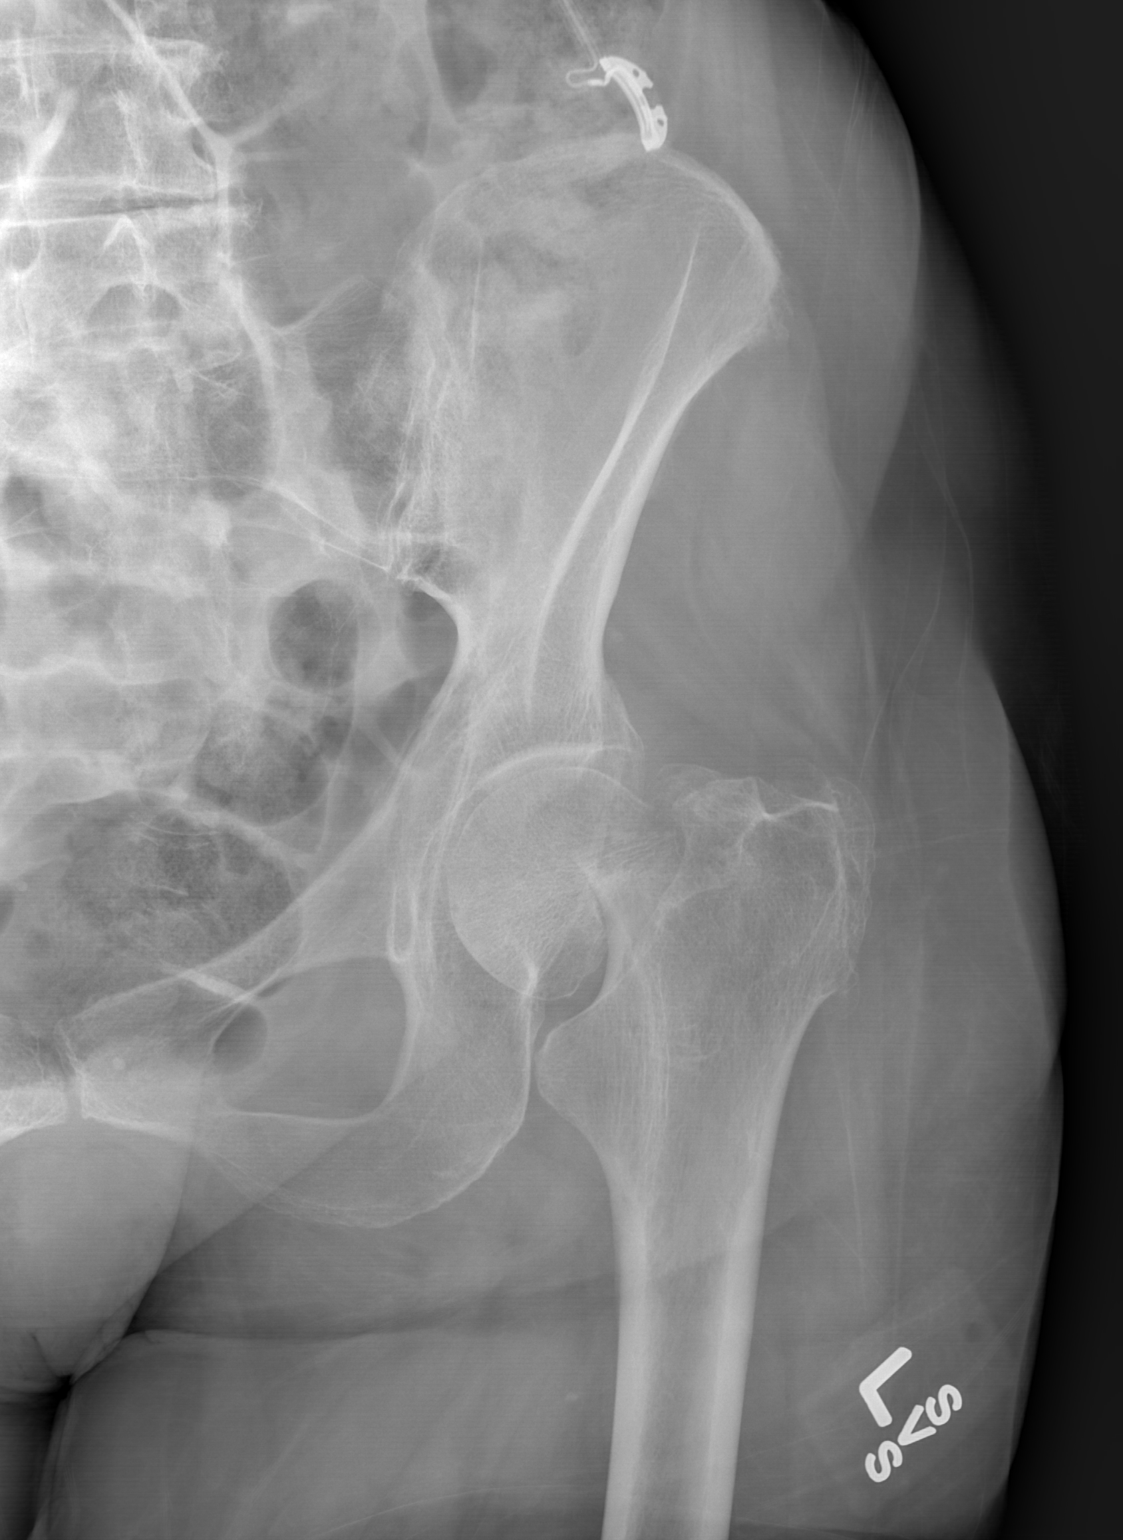

[w hip lat left]
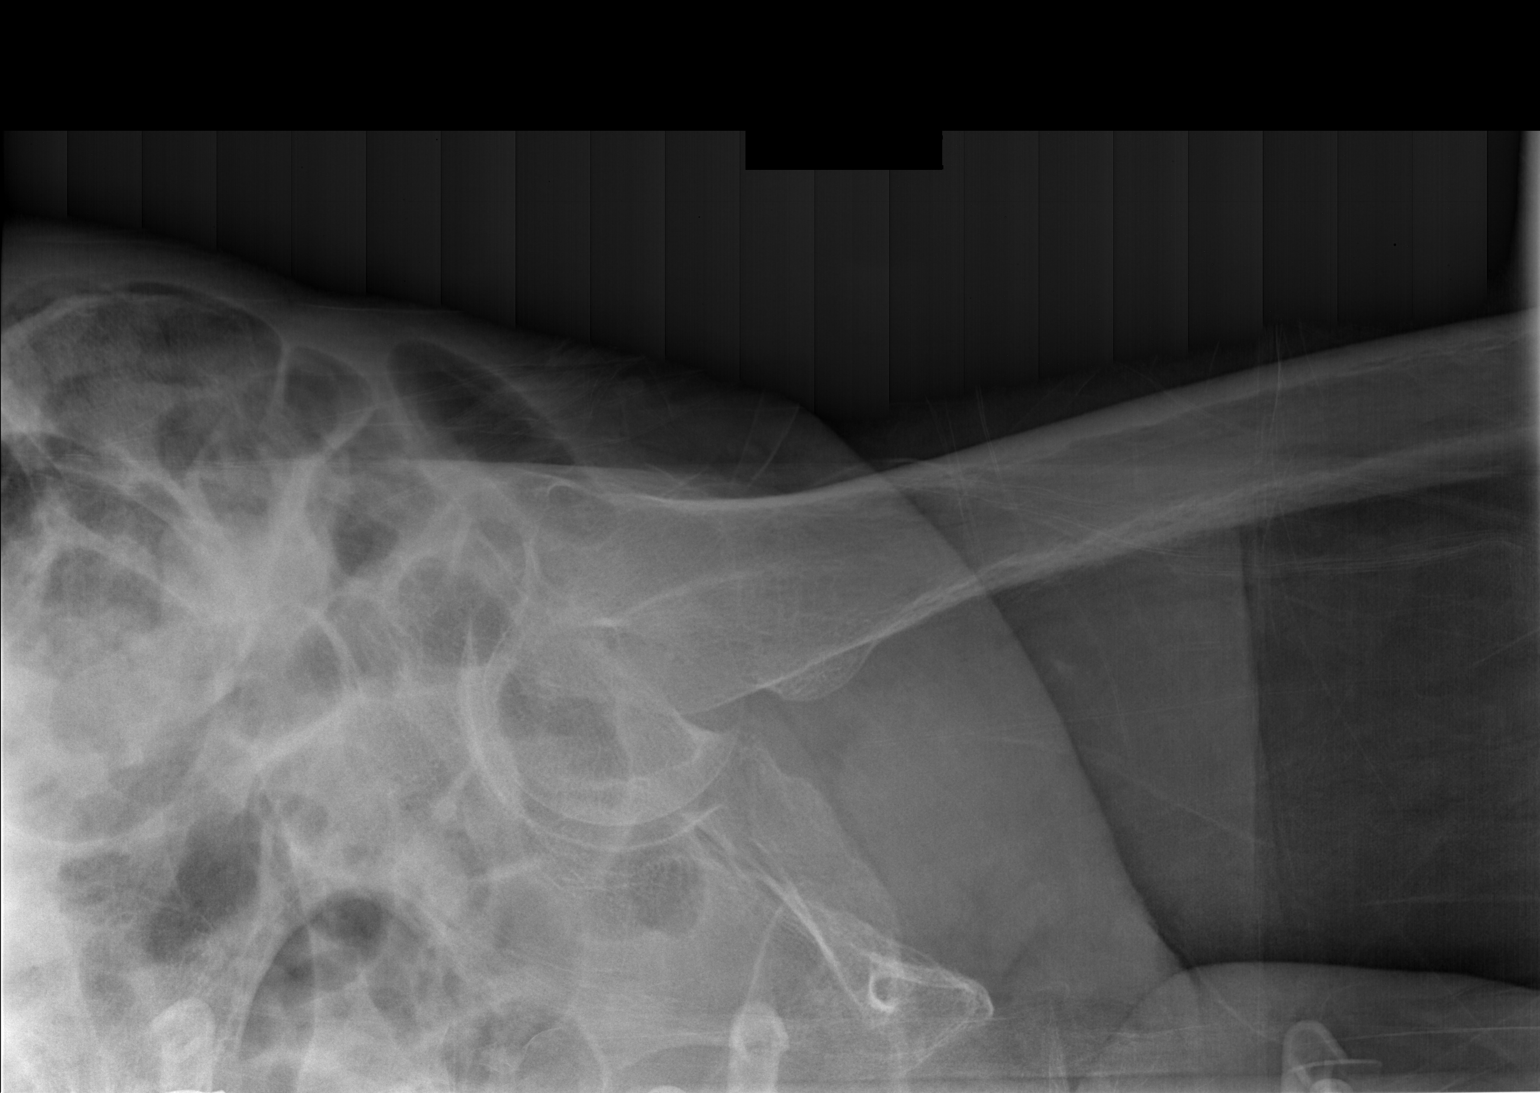

[3 of 3 positions shown; findings below may reference images not displayed]

FINDINGS: LEFT femoral neck fracture with impaction, varus angulation distal
bony fragments. No dislocation. No destructive bony lesions. Soft
tissue planes are nonsuspicious.
IMPRESSION: Impacted displaced LEFT femoral neck fracture without dislocation.

  By: Latia Cedeno

## 2016-01-16 IMAGING — DX DG PORTABLE PELVIS
1 series · 1 of 1 positions shown · non-contrast
Comparison: None.

CLINICAL DATA: Postop left total hip replacement

EXAM:
PORTABLE PELVIS 1-2 VIEWS

[pelvis ap]
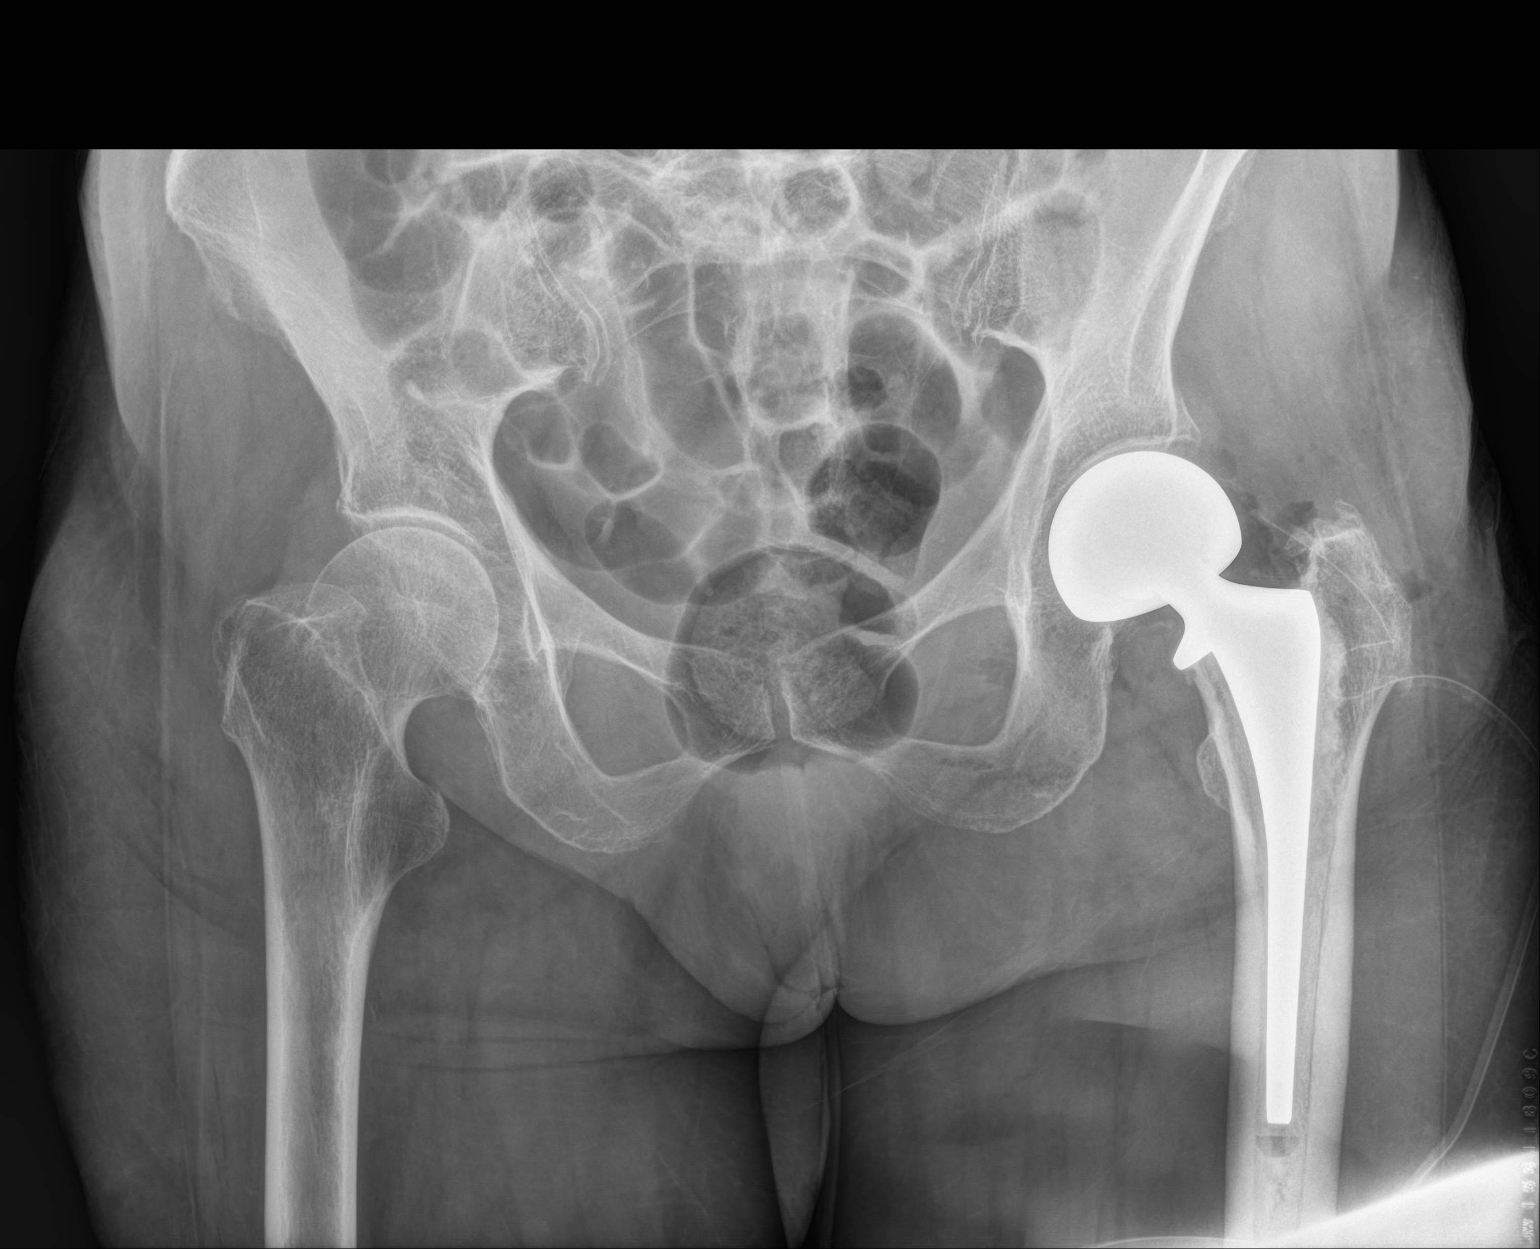

[1 of 1 positions shown; findings below may reference images not displayed]

FINDINGS: Unipolar left hip replacement in anticipated position. Postsurgical
change over the joint as anticipated including surgical drain.
IMPRESSION: Anticipated postoperative appear

## 2016-02-12 IMAGING — CR DG CHEST 2V
2 series · 2 of 2 positions shown · non-contrast
Comparison: 01/31/2014

CLINICAL DATA: Cough for several weeks. Shortness of breath. Chest
tightness.

EXAM:
CHEST  2 VIEW

[view not recorded (1 of 2)]
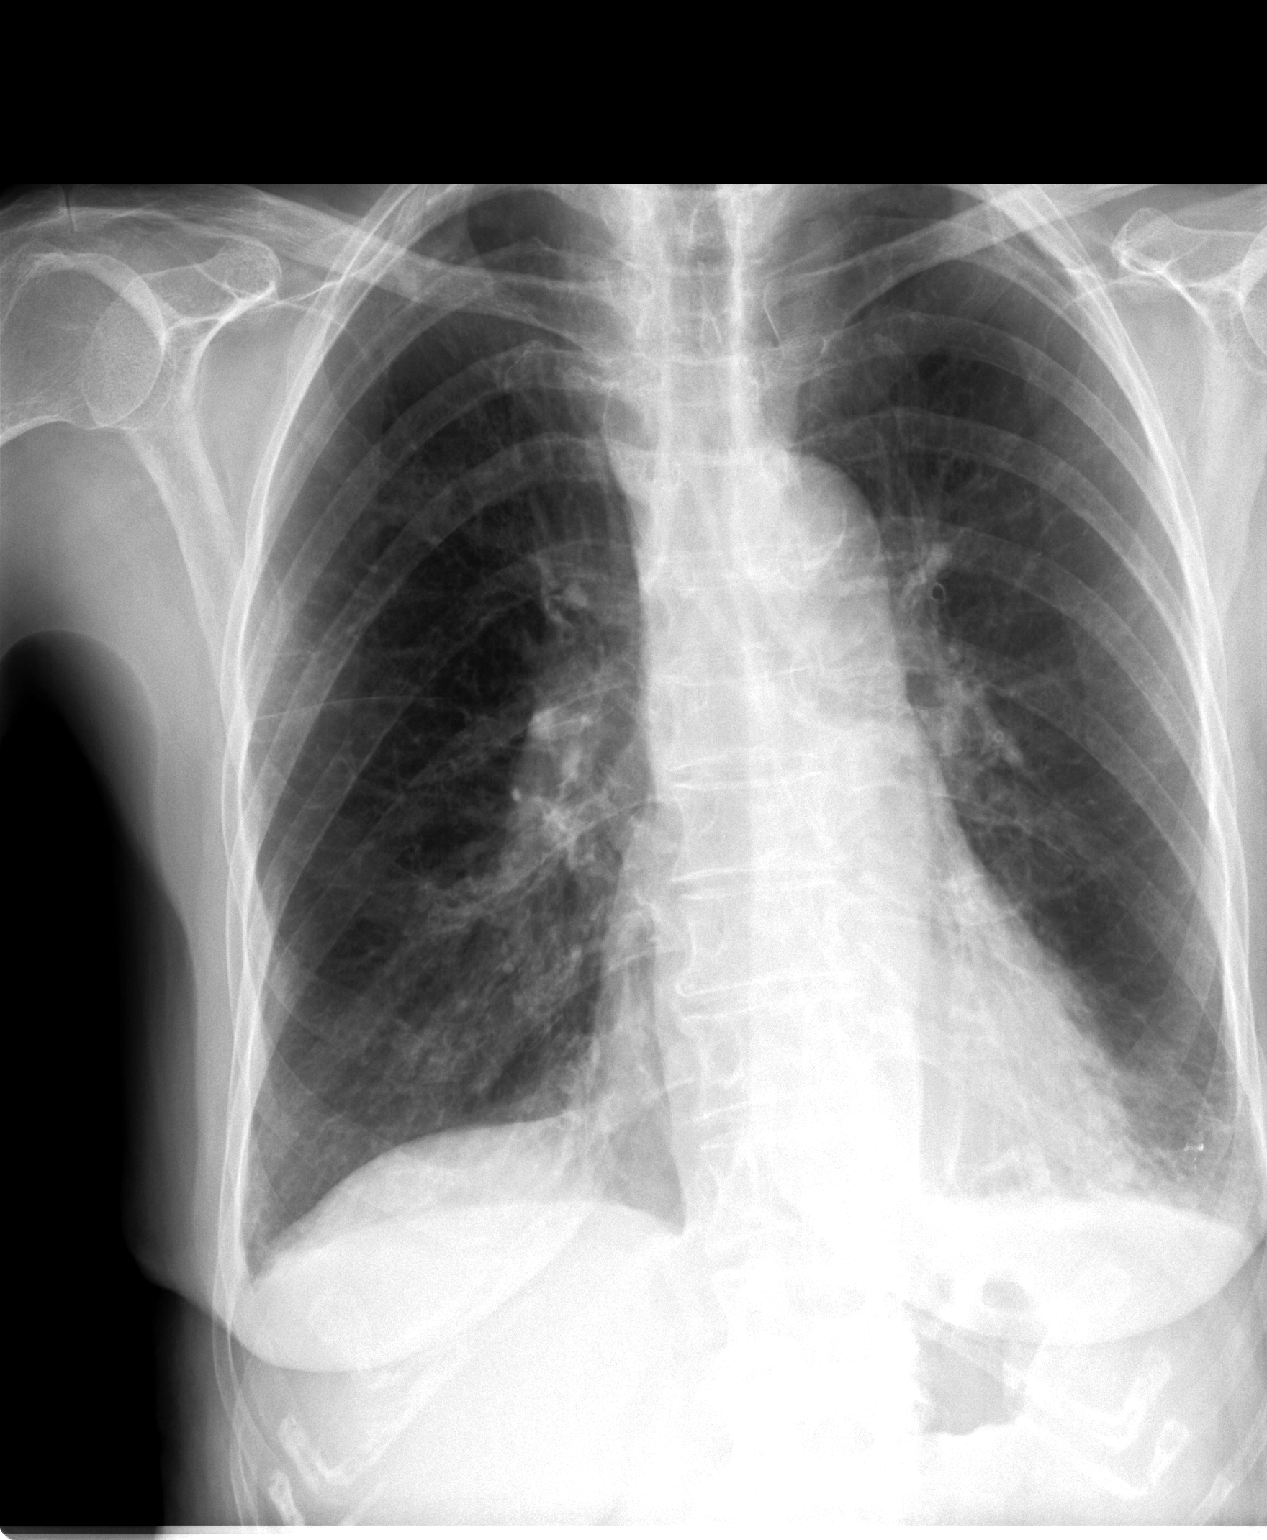

[view not recorded (2 of 2)]
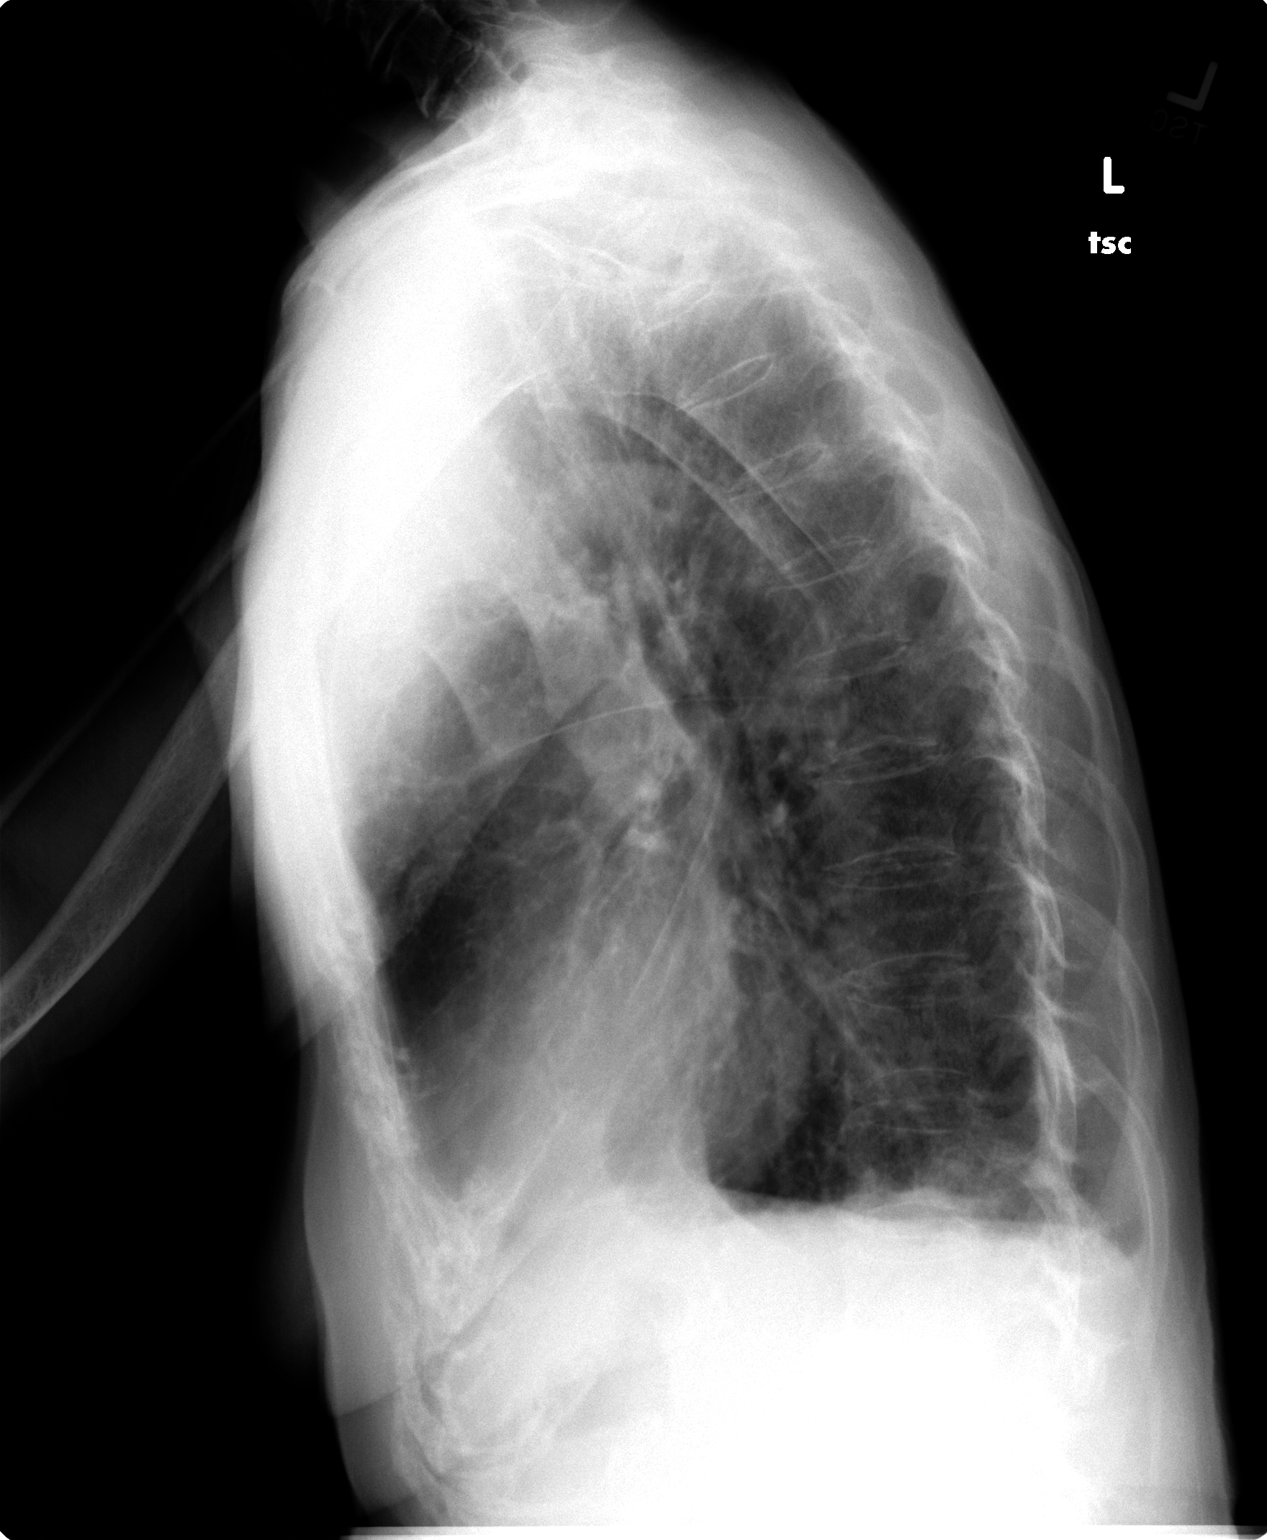

[2 of 2 positions shown; findings below may reference images not displayed]

FINDINGS: The lungs are hyperinflated likely secondary to COPD. There is left
lower lobe hazy airspace disease concerning for pneumonia. There is
no pleural effusion or pneumothorax. There is no other focal
parenchymal opacity. The heart and mediastinal contours are
unremarkable.

The osseous structures are unremarkable.
IMPRESSION: Hazy left lower lobe airspace disease concerning for pneumonia.
Recommend followup radiography in 4-6 weeks, to document complete
resolution following adequate medical therapy. If there is not
complete resolution, then recommend further evaluation with CT of
the chest to exclude underlying pathology.

## 2016-04-07 ENCOUNTER — Encounter: Payer: Self-pay | Admitting: Primary Care

## 2016-04-07 ENCOUNTER — Encounter (INDEPENDENT_AMBULATORY_CARE_PROVIDER_SITE_OTHER): Payer: Self-pay

## 2016-04-07 ENCOUNTER — Ambulatory Visit (INDEPENDENT_AMBULATORY_CARE_PROVIDER_SITE_OTHER): Payer: Medicare Other | Admitting: Primary Care

## 2016-04-07 VITALS — BP 142/72 | HR 86 | Temp 97.5°F | Wt 116.1 lb

## 2016-04-07 DIAGNOSIS — H6123 Impacted cerumen, bilateral: Secondary | ICD-10-CM

## 2016-04-07 NOTE — Progress Notes (Signed)
Pre visit review using our clinic review tool, if applicable. No additional management support is needed unless otherwise documented below in the visit note. 

## 2016-04-07 NOTE — Progress Notes (Signed)
Subjective:    Patient ID: [name], female    DOB: 06-21-1920, 83 y.o.   MRN: [id number]  HPI  [name] is a 83 year old female with a history of senile dementia, HTN, CHF who presents today with a chief complaint of decreased ability to hear. She lives at home with her family. Her daughter noticed that she's had an abrupt change in hearing over the last 1-2 weeks. Her daughter has to get very close to her ears in order for her mother to hear her and her mother has turned up the TV to a very loud volume. She denies dizziness, weakness, changes in speech, congestion, cough.   Review of Systems  HENT: Positive for hearing loss. Negative for congestion and postnasal drip.   Respiratory: Negative for shortness of breath.   Cardiovascular: Negative for chest pain.  Neurological: Negative for dizziness, speech difficulty, weakness, numbness and headaches.       Past Medical History:  Diagnosis Date  . Acute hemorrhagic cystitis 12/04/2014  . Anemia 12/30-01/25/2006   MCH- CHF- started lasix  . CKD (chronic kidney disease), stage III 03/04/2010  . COPD (chronic obstructive pulmonary disease) ([place]) 02/2014   by CXR and VQ scan  . Diastolic CHF ([place])   . Gross hematuria 12/31/2014  . History of colon cancer    s/p partial colectomy  . History of ETT 03/15/2006   adenosine myoview low risk  . Hyperlipidemia 12/2002  . Hypertension 10/1998  . Hypothyroidism   . Left displaced femoral neck fracture ([place]) 02/01/2014  . Microhematuria 03/2013   watchful waiting ([place])  . Right lower lobe pneumonia ([place]) 03/10/2011  . Vaginal delivery    x4, at home     Social History   Social History  . Marital status: Widowed    Spouse name: N/A  . Number of children: 3  . Years of education: N/A   Occupational History  . retired     [employer], 32 years   Social History Main Topics  . Smoking status: Never Smoker  . Smokeless tobacco: Never Used  . Alcohol use No  . Drug use: No    . Sexual activity: No   Other Topics Concern  . Not on file   Social History Narrative   Married, widow 76    Lives with youngest daughter and her husband and youngest son, other daughter lives next door   Occupation: Licensed conveyancer  35yrs   Edu: some HS   Retired      Advanced directives: would want HCPOA to be any of her children. Does not want prolonged life support, does not want feeding tube or breathing tube. Discussed advanced directive. Has not set up.    Past Surgical History:  Procedure Laterality Date  . CHOLECYSTECTOMY  1984  . COLONOSCOPY  03/2010   1 tubular adenoma, int hemorrhoids, f/u prn [name]  . CT HEAD LIMITED W/O CM  02/2011   no acute process.  Mild to moderate cortical volume loss and scattered small vessel ischemic microangiopathy  . ESOPHAGOGASTRODUODENOSCOPY     B9 polyp H.Pylori bx- "dilated" polyps  . HIP ARTHROPLASTY Left 02/01/2014   hemiarthroplasty s/p fem fracture after fall [name], MD)  . History of Abd  02/25/2006   bilat renal cysts, no mets  . PARTIAL COLECTOMY  03/25/2006   right    Family History  Problem Relation Age of Onset  . Stroke Sister   . Diabetes Sister   .  Hypertension Sister   . Stroke Mother   . Alcohol abuse Brother     No Known Allergies  Current Outpatient Prescriptions on File Prior to Visit  Medication Sig Dispense Refill  . acetaminophen (TYLENOL) 325 MG tablet Take 2 tablets (650 mg total) by mouth every 6 (six) hours as needed for mild pain or moderate pain (or Fever >/= 101). 80 tablet 0  . amLODipine (NORVASC) 5 MG tablet Take 1 tablet (5 mg total) by mouth every evening. 90 tablet 3  . aspirin 81 MG tablet Take 81 mg by mouth daily.    . Cholecalciferol (VITAMIN D3) 1000 UNITS CAPS Take by mouth 2 (two) times daily.      . ferrous sulfate 325 (65 FE) MG tablet Take 1 tablet (325 mg total) by mouth every Monday, Wednesday, and Friday.    . furosemide (LASIX) 20 MG tablet TAKE 1/2 TABLET BY MOUTH  EVERY MORNING. 45 tablet 2  . levothyroxine (SYNTHROID, LEVOTHROID) 25 MCG tablet TAKE 1 TABLET (25 MCG TOTAL) BY MOUTH DAILY BEFORE BREAKFAST. 30 tablet 3  . Multiple Vitamins-Minerals (EYE VITAMINS & MINERALS PO) Take 1 tablet by mouth daily. [facility]    . omeprazole (PRILOSEC OTC) 20 MG tablet Take 1 tablet (20 mg total) by mouth daily as needed.    . vitamin B-12 (CYANOCOBALAMIN) 1000 MCG tablet Take 1,000 mcg by mouth daily.       No current facility-administered medications on file prior to visit.     BP (!) 142/72   Pulse 86   Temp 97.5 F (36.4 C) (Oral)   Wt 116 lb 1.9 oz (52.7 kg)   SpO2 97%   BMI 18.74 kg/m    Objective:   Physical Exam  Constitutional: She appears well-nourished.  HENT:  Right Ear: Tympanic membrane and ear canal normal.  Left Ear: Tympanic membrane and ear canal normal.  Nose: Right sinus exhibits no maxillary sinus tenderness and no frontal sinus tenderness. Left sinus exhibits no maxillary sinus tenderness and no frontal sinus tenderness.  Mouth/Throat: Oropharynx is clear and moist.  Moderate-severe cerumen impaction to bilateral ears. Right canal post irrigation with mild bleeding to skin. TM's unremarkable. Patient's hearing improved instantly after irrigation, had to yell loudly next to ear in order for her to hear pre-irrigation.  Eyes: Conjunctivae are normal.  Neck: Neck supple.  Cardiovascular: Normal rate and regular rhythm.   Pulmonary/Chest: Effort normal and breath sounds normal. She has no wheezes. She has no rales.  Lymphadenopathy:    She has no cervical adenopathy.  Skin: Skin is warm and dry.          Assessment & Plan:  Cerumen Impaction:  Decreased hearing x 1-2 weeks. No URI or Neuro symptoms. Exam today with moderate-severe cerumen impaction as noted above. Hearing improved instantly. No s/s of acute infection. Discussed use of Debrox drops in the future. Follow up PRN.  Traci Flow, NP

## 2016-04-07 NOTE — Patient Instructions (Signed)
You had impaction of ear wax in both ears.  Consider trying Debrox drops if this occurs again. This will help soften the ear wax.   It was a pleasure meeting you!   Earwax Buildup Your ears make a substance called earwax. It may also be called cerumen. Sometimes, too much earwax builds up in your ear canal. This can cause ear pain and make it harder for you to hear. CAUSES This condition is caused by too much earwax production or buildup. RISK FACTORS The following factors may make you more likely to develop this condition:  Cleaning your ears often with swabs.  Having narrow ear canals.  Having earwax that is overly thick or sticky.  Having eczema.  Being dehydrated. SYMPTOMS Symptoms of this condition include:  Reduced hearing.  Ear drainage.  Ear pain.  Ear itch.  A feeling of fullness in the ear or feeling that the ear is plugged.  Ringing in the ear.  Coughing. DIAGNOSIS Your health care provider can diagnose this condition based on your symptoms and medical history. Your health care provider will also do an ear exam to look inside your ear with a scope (otoscope). You may also have a hearing test. TREATMENT Treatment for this condition includes:  Over-the-counter or prescription ear drops to soften the earwax.  Earwax removal by a health care provider. This may be done:  By flushing the ear with body-temperature water.  With a medical instrument that has a loop at the end (earwax curette).  With a suction device. HOME CARE INSTRUCTIONS  Take over-the-counter and prescription medicines only as told by your health care provider.  Do not put any objects, including an ear swab, into your ear. You can clean the opening of your ear canal with a washcloth.  Drink enough water to keep your urine clear or pale yellow.  If you have frequent earwax buildup or you use hearing aids, consider seeing your health care provider every 6-12 months for routine  preventive ear cleanings. Keep all follow-up visits as told by your health care provider. SEEK MEDICAL CARE IF:  You have ear pain.  Your condition does not improve with treatment.  You have hearing loss.  You have blood, pus, or other fluid coming from your ear. This information is not intended to replace advice given to you by your health care provider. Make sure you discuss any questions you have with your health care provider. Document Released: 02/19/2004 Document Revised: 05/05/2015 Document Reviewed: 08/28/2014 Elsevier Interactive Patient Education  2017 [employer].

## 2016-04-09 ENCOUNTER — Other Ambulatory Visit: Payer: Self-pay | Admitting: Family Medicine

## 2016-04-20 ENCOUNTER — Ambulatory Visit (INDEPENDENT_AMBULATORY_CARE_PROVIDER_SITE_OTHER): Payer: Medicare Other | Admitting: Family Medicine

## 2016-04-20 ENCOUNTER — Encounter: Payer: Self-pay | Admitting: Family Medicine

## 2016-04-20 VITALS — BP 126/70 | HR 80 | Temp 97.6°F | Ht 66.0 in | Wt 118.2 lb

## 2016-04-20 DIAGNOSIS — Z7189 Other specified counseling: Secondary | ICD-10-CM | POA: Diagnosis not present

## 2016-04-20 DIAGNOSIS — E039 Hypothyroidism, unspecified: Secondary | ICD-10-CM

## 2016-04-20 DIAGNOSIS — Z8500 Personal history of malignant neoplasm of unspecified digestive organ: Secondary | ICD-10-CM

## 2016-04-20 DIAGNOSIS — Z0000 Encounter for general adult medical examination without abnormal findings: Secondary | ICD-10-CM

## 2016-04-20 DIAGNOSIS — N183 Chronic kidney disease, stage 3 unspecified: Secondary | ICD-10-CM

## 2016-04-20 DIAGNOSIS — F0390 Unspecified dementia without behavioral disturbance: Secondary | ICD-10-CM

## 2016-04-20 DIAGNOSIS — I5032 Chronic diastolic (congestive) heart failure: Secondary | ICD-10-CM

## 2016-04-20 DIAGNOSIS — I10 Essential (primary) hypertension: Secondary | ICD-10-CM | POA: Diagnosis not present

## 2016-04-20 NOTE — Progress Notes (Signed)
BP 126/70   Pulse 80   Temp 97.6 F (36.4 C) (Oral)   Ht 5\' 6"  (1.676 m)   Wt 118 lb 4 oz (53.6 kg)   BMI 19.09 kg/m    CC: CPE Subjective:    Patient ID: [name], female    DOB: March 14, 1920, 83 y.o.   MRN: [id number]  HPI: [name] is a 83 y.o. female presenting on 04/20/2016 for Annual Exam   Rare PPI use. No GERD sxs.  Someone stays with her all day. Pt does not drive or prepare meals. Daughter helps her with showering. Denies bowel/bladder incontinence. s/p hip fracture 01/2014. Regular with walker use No recent coughing episodes.  Hearing - failed  Vision - with eye doctor  Falls - passed  Depression - passed   Preventative: Colonoscopy 03/2010 - 1 polyp, int hemorrhoids. rec f/u with GI PRN. [name]. Aged out. Mammogram - normal 12/2013. Aged out. Declines breast exam.  Well woman - age out. No unexpected bleeding, weight changes. DEXA - unsure.  Flu shot yearly Tetanus 06/2013 Pneumovax and prevnar completed Shingles shot - 2015 Advanced directives: would want HCPOA to be any of her children. Does not want prolonged life support, does not want feeding tube or breathing tube. Discussed advanced directive. Has not set up, not interested in setting up. Seat belt use discussed Denies changing moles on skin.  Non smoker Alcohol - none  Married, widow 76  Lives with youngest daughter (Dot) and her husband and youngest son, other daughter lives next door Someone stays at home Occupation: retired from [employer]  34yrs Edu: some HS Retired  Relevant past medical, surgical, family and social history reviewed and updated as indicated. Interim medical history since our last visit reviewed. Allergies and medications reviewed and updated. Outpatient Medications Prior to Visit  Medication Sig Dispense Refill  . acetaminophen (TYLENOL) 325 MG tablet Take 2 tablets (650 mg total) by mouth every 6 (six) hours as needed for mild pain or moderate pain (or  Fever >/= 101). 80 tablet 0  . amLODipine (NORVASC) 5 MG tablet Take 1 tablet (5 mg total) by mouth every evening. 90 tablet 3  . aspirin 81 MG tablet Take 81 mg by mouth daily.    . Cholecalciferol (VITAMIN D3) 1000 UNITS CAPS Take by mouth 2 (two) times daily.      . ferrous sulfate 325 (65 FE) MG tablet Take 1 tablet (325 mg total) by mouth every Monday, Wednesday, and Friday.    . furosemide (LASIX) 20 MG tablet TAKE 1/2 TABLET BY MOUTH EVERY MORNING. 45 tablet 1  . levothyroxine (SYNTHROID, LEVOTHROID) 25 MCG tablet TAKE 1 TABLET (25 MCG TOTAL) BY MOUTH DAILY BEFORE BREAKFAST. 30 tablet 3  . Multiple Vitamins-Minerals (EYE VITAMINS & MINERALS PO) Take 1 tablet by mouth daily. [facility]    . omeprazole (PRILOSEC OTC) 20 MG tablet Take 1 tablet (20 mg total) by mouth daily as needed.    . vitamin B-12 (CYANOCOBALAMIN) 1000 MCG tablet Take 1,000 mcg by mouth daily.       No facility-administered medications prior to visit.      Per HPI unless specifically indicated in ROS section below Review of Systems     Objective:    BP 126/70   Pulse 80   Temp 97.6 F (36.4 C) (Oral)   Ht 5\' 6"  (1.676 m)   Wt 118 lb 4 oz (53.6 kg)   BMI 19.09 kg/m   Wt Readings  from Last 3 Encounters:  04/20/16 118 lb 4 oz (53.6 kg)  04/07/16 116 lb 1.9 oz (52.7 kg)  01/09/16 113 lb 12 oz (51.6 kg)    Physical Exam  Constitutional: She is oriented to person, place, and time. She appears well-developed and well-nourished. No distress.  HENT:  Head: Normocephalic and atraumatic.  Right Ear: Hearing, tympanic membrane, external ear and ear canal normal.  Left Ear: Hearing, tympanic membrane, external ear and ear canal normal.  Nose: Nose normal.  Mouth/Throat: Uvula is midline, oropharynx is clear and moist and mucous membranes are normal. No oropharyngeal exudate, posterior oropharyngeal edema or posterior oropharyngeal erythema.  Eyes: Conjunctivae and EOM are normal. Pupils are equal, round, and  reactive to light. No scleral icterus.  Neck: Normal range of motion. Neck supple.  Cardiovascular: Normal rate, regular rhythm, normal heart sounds and intact distal pulses.   No murmur heard. Pulses:      Radial pulses are 2+ on the right side, and 2+ on the left side.  Pulmonary/Chest: Effort normal and breath sounds normal. No respiratory distress. She has no wheezes. She has no rales.  Abdominal: Soft. Bowel sounds are normal. She exhibits no distension and no mass. There is no tenderness. There is no rebound and no guarding.  Musculoskeletal: Normal range of motion. She exhibits no edema.  Unable to get on exam table  Lymphadenopathy:    She has no cervical adenopathy.  Neurological: She is alert and oriented to person, place, and time.  CN grossly intact, station and gait intact Recall 0/3 Calculation difficulty - able to subtract 3s 1 at a time   Skin: Skin is warm and dry. No rash noted.  Psychiatric: She has a normal mood and affect. Her behavior is normal. Judgment and thought content normal.  Nursing note and vitals reviewed.  Results for orders placed or performed in visit on 01/09/16  TSH  Result Value Ref Range   TSH 3.63 0.35 - 4.50 uIU/mL  Renal function panel  Result Value Ref Range   Sodium 143 135 - 145 mEq/L   Potassium 4.1 3.5 - 5.1 mEq/L   Chloride 106 96 - 112 mEq/L   CO2 32 19 - 32 mEq/L   Calcium 9.8 8.4 - 10.5 mg/dL   Albumin 4.1 3.5 - 5.2 g/dL   BUN 22 6 - 23 mg/dL   Creatinine, Ser 1.22 (H) 0.40 - 1.20 mg/dL   Glucose, Bld 91 70 - 99 mg/dL   Phosphorus 3.5 2.3 - 4.6 mg/dL   GFR 43.71 (L) >60.00 mL/min      Assessment & Plan:   Problem List Items Addressed This Visit    Advanced care planning/counseling discussion    Advanced directives: would want HCPOA to be any of her children. Does not want prolonged life support, does not want feeding tube or breathing tube. Discussed advanced directive. Has not set up, not interested in setting up.       Chronic diastolic CHF (congestive heart failure) (HCC)    Chronic, stable on low dose lasix .      CKD (chronic kidney disease), stage III    Chronic, stable. Labs next visit.       Essential hypertension    Chronic, stable. Continue amlodipine.       Hypothyroidism    Recent TSH stable.       Medicare annual wellness visit, subsequent - Primary    I have personally reviewed the Medicare Annual Wellness questionnaire and have noted  1. The patient's medical and social history 2. Their use of alcohol, tobacco or illicit drugs 3. Their current medications and supplements 4. The patient's functional ability including ADL's, fall risks, home safety risks and hearing or visual impairment. Cognitive function has been assessed and addressed as indicated.  5. Diet and physical activity 6. Evidence for depression or mood disorders The patients weight, height, BMI have been recorded in the chart. I have made referrals, counseling and provided education to the patient based on review of the above and I have provided the pt with a written personalized care plan for preventive services. Provider list updated.. See scanned questionairre as needed for further documentation. Reviewed preventative protocols and updated unless pt declined.       Personal history of malignant neoplasm of gastrointestinal tract    Pt has declined further evaluation.       Senile dementia    No functional changes noted. Anticipated senile dementia. Family supportive - lives with daughter and SIL and son, other daughter is neighbor.           Follow up plan: Return in about 6 months (around 10/21/2016) for follow up visit.  Ria Bush, MD

## 2016-04-20 NOTE — Assessment & Plan Note (Signed)
Chronic, stable. Labs next visit.

## 2016-04-20 NOTE — Assessment & Plan Note (Signed)
Chronic, stable on low dose lasix 

## 2016-04-20 NOTE — Assessment & Plan Note (Signed)
Advanced directives: would want HCPOA to be any of her children. Does not want prolonged life support, does not want feeding tube or breathing tube. Discussed advanced directive. Has not set up, not interested in setting up.

## 2016-04-20 NOTE — Patient Instructions (Addendum)
Return in 6 months for labs and visit. You are doing well today.  Continue current medicines  Health Maintenance, Female Adopting a healthy lifestyle and getting preventive care can go a long way to promote health and wellness. Talk with your health care provider about what schedule of regular examinations is right for you. This is a good chance for you to check in with your provider about disease prevention and staying healthy. In between checkups, there are plenty of things you can do on your own. Experts have done a lot of research about which lifestyle changes and preventive measures are most likely to keep you healthy. Ask your health care provider for more information. Weight and diet Eat a healthy diet  Be sure to include plenty of vegetables, fruits, low-fat dairy products, and lean protein.  Do not eat a lot of foods high in solid fats, added sugars, or salt.  Get regular exercise. This is one of the most important things you can do for your health.  Most adults should exercise for at least 150 minutes each week. The exercise should increase your heart rate and make you sweat (moderate-intensity exercise).  Most adults should also do strengthening exercises at least twice a week. This is in addition to the moderate-intensity exercise. Maintain a healthy weight  Body mass index (BMI) is a measurement that can be used to identify possible weight problems. It estimates body fat based on height and weight. Your health care provider can help determine your BMI and help you achieve or maintain a healthy weight.  For females 64 years of age and older:  A BMI below 18.5 is considered underweight.  A BMI of 18.5 to 24.9 is normal.  A BMI of 25 to 29.9 is considered overweight.  A BMI of 30 and above is considered obese. Watch levels of cholesterol and blood lipids  You should start having your blood tested for lipids and cholesterol at 81 years of age, then have this test every 5  years.  You may need to have your cholesterol levels checked more often if:  Your lipid or cholesterol levels are high.  You are older than 81 years of age.  You are at high risk for heart disease. Cancer screening Lung Cancer  Lung cancer screening is recommended for adults 76-32 years old who are at high risk for lung cancer because of a history of smoking.  A yearly low-dose CT scan of the lungs is recommended for people who:  Currently smoke.  Have quit within the past 15 years.  Have at least a 30-pack-year history of smoking. A pack year is smoking an average of one pack of cigarettes a day for 1 year.  Yearly screening should continue until it has been 15 years since you quit.  Yearly screening should stop if you develop a health problem that would prevent you from having lung cancer treatment. Breast Cancer  Practice breast self-awareness. This means understanding how your breasts normally appear and feel.  It also means doing regular breast self-exams. Let your health care provider know about any changes, no matter how small.  If you are in your 20s or 30s, you should have a clinical breast exam (CBE) by a health care provider every 1-3 years as part of a regular health exam.  If you are 51 or older, have a CBE every year. Also consider having a breast X-ray (mammogram) every year.  If you have a family history of breast cancer, talk to  your health care provider about genetic screening.  If you are at high risk for breast cancer, talk to your health care provider about having an MRI and a mammogram every year.  Breast cancer gene (BRCA) assessment is recommended for women who have family members with BRCA-related cancers. BRCA-related cancers include:  Breast.  Ovarian.  Tubal.  Peritoneal cancers.  Results of the assessment will determine the need for genetic counseling and BRCA1 and BRCA2 testing. Cervical Cancer  Your health care provider may recommend  that you be screened regularly for cancer of the pelvic organs (ovaries, uterus, and vagina). This screening involves a pelvic examination, including checking for microscopic changes to the surface of your cervix (Pap test). You may be encouraged to have this screening done every 3 years, beginning at age 68.  For women ages 47-65, health care providers may recommend pelvic exams and Pap testing every 3 years, or they may recommend the Pap and pelvic exam, combined with testing for human papilloma virus (HPV), every 5 years. Some types of HPV increase your risk of cervical cancer. Testing for HPV may also be done on women of any age with unclear Pap test results.  Other health care providers may not recommend any screening for nonpregnant women who are considered low risk for pelvic cancer and who do not have symptoms. Ask your health care provider if a screening pelvic exam is right for you.  If you have had past treatment for cervical cancer or a condition that could lead to cancer, you need Pap tests and screening for cancer for at least 20 years after your treatment. If Pap tests have been discontinued, your risk factors (such as having a new sexual partner) need to be reassessed to determine if screening should resume. Some women have medical problems that increase the chance of getting cervical cancer. In these cases, your health care provider may recommend more frequent screening and Pap tests. Colorectal Cancer  This type of cancer can be detected and often prevented.  Routine colorectal cancer screening usually begins at 81 years of age and continues through 81 years of age.  Your health care provider may recommend screening at an earlier age if you have risk factors for colon cancer.  Your health care provider may also recommend using home test kits to check for hidden blood in the stool.  A small camera at the end of a tube can be used to examine your colon directly (sigmoidoscopy or  colonoscopy). This is done to check for the earliest forms of colorectal cancer.  Routine screening usually begins at age 24.  Direct examination of the colon should be repeated every 5-10 years through 81 years of age. However, you may need to be screened more often if early forms of precancerous polyps or small growths are found. Skin Cancer  Check your skin from head to toe regularly.  Tell your health care provider about any new moles or changes in moles, especially if there is a change in a mole's shape or color.  Also tell your health care provider if you have a mole that is larger than the size of a pencil eraser.  Always use sunscreen. Apply sunscreen liberally and repeatedly throughout the day.  Protect yourself by wearing long sleeves, pants, a wide-brimmed hat, and sunglasses whenever you are outside. Heart disease, diabetes, and high blood pressure  High blood pressure causes heart disease and increases the risk of stroke. High blood pressure is more likely to develop in:  People who have blood pressure in the high end of the normal range (130-139/85-89 mm Hg).  People who are overweight or obese.  People who are African American.  If you are 60-50 years of age, have your blood pressure checked every 3-5 years. If you are 33 years of age or older, have your blood pressure checked every year. You should have your blood pressure measured twice-once when you are at a hospital or clinic, and once when you are not at a hospital or clinic. Record the average of the two measurements. To check your blood pressure when you are not at a hospital or clinic, you can use:  An automated blood pressure machine at a pharmacy.  A home blood pressure monitor.  If you are between 27 years and 42 years old, ask your health care provider if you should take aspirin to prevent strokes.  Have regular diabetes screenings. This involves taking a blood sample to check your fasting blood sugar  level.  If you are at a normal weight and have a low risk for diabetes, have this test once every three years after 81 years of age.  If you are overweight and have a high risk for diabetes, consider being tested at a younger age or more often. Preventing infection Hepatitis B  If you have a higher risk for hepatitis B, you should be screened for this virus. You are considered at high risk for hepatitis B if:  You were born in a country where hepatitis B is common. Ask your health care provider which countries are considered high risk.  Your parents were born in a high-risk country, and you have not been immunized against hepatitis B (hepatitis B vaccine).  You have HIV or AIDS.  You use needles to inject street drugs.  You live with someone who has hepatitis B.  You have had sex with someone who has hepatitis B.  You get hemodialysis treatment.  You take certain medicines for conditions, including cancer, organ transplantation, and autoimmune conditions. Hepatitis C  Blood testing is recommended for:  Everyone born from [birth year] through 1963.  Anyone with known risk factors for hepatitis C. Sexually transmitted infections (STIs)  You should be screened for sexually transmitted infections (STIs) including gonorrhea and chlamydia if:  You are sexually active and are younger than 81 years of age.  You are older than 81 years of age and your health care provider tells you that you are at risk for this type of infection.  Your sexual activity has changed since you were last screened and you are at an increased risk for chlamydia or gonorrhea. Ask your health care provider if you are at risk.  If you do not have HIV, but are at risk, it may be recommended that you take a prescription medicine daily to prevent HIV infection. This is called pre-exposure prophylaxis (PrEP). You are considered at risk if:  You are sexually active and do not regularly use condoms or know the HIV status  of your partner(s).  You take drugs by injection.  You are sexually active with a partner who has HIV. Talk with your health care provider about whether you are at high risk of being infected with HIV. If you choose to begin PrEP, you should first be tested for HIV. You should then be tested every 3 months for as long as you are taking PrEP. Pregnancy  If you are premenopausal and you may become pregnant, ask your health care provider about  preconception counseling.  If you may become pregnant, take 400 to 800 micrograms (mcg) of folic acid every day.  If you want to prevent pregnancy, talk to your health care provider about birth control (contraception). Osteoporosis and menopause  Osteoporosis is a disease in which the bones lose minerals and strength with aging. This can result in serious bone fractures. Your risk for osteoporosis can be identified using a bone density scan.  If you are 54 years of age or older, or if you are at risk for osteoporosis and fractures, ask your health care provider if you should be screened.  Ask your health care provider whether you should take a calcium or vitamin D supplement to lower your risk for osteoporosis.  Menopause may have certain physical symptoms and risks.  Hormone replacement therapy may reduce some of these symptoms and risks. Talk to your health care provider about whether hormone replacement therapy is right for you. Follow these instructions at home:  Schedule regular health, dental, and eye exams.  Stay current with your immunizations.  Do not use any tobacco products including cigarettes, chewing tobacco, or electronic cigarettes.  If you are pregnant, do not drink alcohol.  If you are breastfeeding, limit how much and how often you drink alcohol.  Limit alcohol intake to no more than 1 drink per day for nonpregnant women. One drink equals 12 ounces of beer, 5 ounces of wine, or 1 ounces of hard liquor.  Do not use street  drugs.  Do not share needles.  Ask your health care provider for help if you need support or information about quitting drugs.  Tell your health care provider if you often feel depressed.  Tell your health care provider if you have ever been abused or do not feel safe at home. This information is not intended to replace advice given to you by your health care provider. Make sure you discuss any questions you have with your health care provider. Document Released: 07/27/2010 Document Revised: 06/19/2015 Document Reviewed: 10/15/2014 Elsevier Interactive Patient Education  2017 [employer].

## 2016-04-20 NOTE — Progress Notes (Signed)
Pre visit review using our clinic review tool, if applicable. No additional management support is needed unless otherwise documented below in the visit note. 

## 2016-04-20 NOTE — Assessment & Plan Note (Signed)

## 2016-04-20 NOTE — Assessment & Plan Note (Signed)
Recent TSH stable.

## 2016-04-20 NOTE — Assessment & Plan Note (Addendum)
No functional changes noted. Anticipated senile dementia. Family supportive - lives with daughter and SIL and son, other daughter is neighbor.

## 2016-04-20 NOTE — Assessment & Plan Note (Signed)
Pt has declined further evaluation.

## 2016-04-20 NOTE — Assessment & Plan Note (Signed)
Chronic, stable. Continue amlodipine.  

## 2016-05-22 ENCOUNTER — Other Ambulatory Visit: Payer: Self-pay | Admitting: Family Medicine

## 2016-07-18 ENCOUNTER — Other Ambulatory Visit: Payer: Self-pay | Admitting: Family Medicine

## 2016-10-04 ENCOUNTER — Other Ambulatory Visit: Payer: Self-pay | Admitting: Family Medicine

## 2016-10-10 ENCOUNTER — Other Ambulatory Visit: Payer: Self-pay | Admitting: Family Medicine

## 2016-10-10 DIAGNOSIS — N183 Chronic kidney disease, stage 3 unspecified: Secondary | ICD-10-CM

## 2016-10-10 DIAGNOSIS — E559 Vitamin D deficiency, unspecified: Secondary | ICD-10-CM

## 2016-10-10 DIAGNOSIS — E039 Hypothyroidism, unspecified: Secondary | ICD-10-CM

## 2016-10-15 ENCOUNTER — Other Ambulatory Visit (INDEPENDENT_AMBULATORY_CARE_PROVIDER_SITE_OTHER): Payer: Medicare Other

## 2016-10-15 DIAGNOSIS — E039 Hypothyroidism, unspecified: Secondary | ICD-10-CM

## 2016-10-15 DIAGNOSIS — N183 Chronic kidney disease, stage 3 unspecified: Secondary | ICD-10-CM

## 2016-10-15 DIAGNOSIS — E559 Vitamin D deficiency, unspecified: Secondary | ICD-10-CM | POA: Diagnosis not present

## 2016-10-15 LAB — RENAL FUNCTION PANEL
ALBUMIN: 3.8 g/dL (ref 3.5–5.2)
BUN: 19 mg/dL (ref 6–23)
CALCIUM: 9.7 mg/dL (ref 8.4–10.5)
CHLORIDE: 106 meq/L (ref 96–112)
CO2: 31 mEq/L (ref 19–32)
Creatinine, Ser: 1.31 mg/dL — ABNORMAL HIGH (ref 0.40–1.20)
GFR: 40.2 mL/min — AB (ref >60.00)
GLUCOSE: 89 mg/dL (ref 70–99)
POTASSIUM: 4.1 meq/L (ref 3.5–5.1)
Phosphorus: 3.6 mg/dL (ref 2.3–4.6)
Sodium: 143 mEq/L (ref 135–145)

## 2016-10-15 LAB — LIPID PANEL
Cholesterol: 154 mg/dL (ref 0–200)
HDL: 65.3 mg/dL (ref >39.00)
LDL CALC: 77 mg/dL (ref 0–99)
NONHDL: 88.87
Total CHOL/HDL Ratio: 2
Triglycerides: 59 mg/dL (ref 0.0–149.0)
VLDL: 11.8 mg/dL (ref 0.0–40.0)

## 2016-10-15 LAB — VITAMIN D 25 HYDROXY (VIT D DEFICIENCY, FRACTURES): VITD: 81.8 ng/mL (ref 30.00–100.00)

## 2016-10-15 LAB — TSH: TSH: 3.8 u[IU]/mL (ref 0.35–4.50)

## 2016-10-19 ENCOUNTER — Other Ambulatory Visit: Payer: Medicare Other

## 2016-10-20 ENCOUNTER — Ambulatory Visit: Payer: Medicare Other | Admitting: Family Medicine

## 2016-10-20 ENCOUNTER — Encounter: Payer: Self-pay | Admitting: Family Medicine

## 2016-10-20 ENCOUNTER — Ambulatory Visit (INDEPENDENT_AMBULATORY_CARE_PROVIDER_SITE_OTHER): Payer: Medicare Other | Admitting: Family Medicine

## 2016-10-20 VITALS — BP 120/56 | HR 76 | Temp 97.6°F | Wt 119.2 lb

## 2016-10-20 DIAGNOSIS — E039 Hypothyroidism, unspecified: Secondary | ICD-10-CM | POA: Diagnosis not present

## 2016-10-20 DIAGNOSIS — Z23 Encounter for immunization: Secondary | ICD-10-CM

## 2016-10-20 DIAGNOSIS — F0390 Unspecified dementia without behavioral disturbance: Secondary | ICD-10-CM | POA: Diagnosis not present

## 2016-10-20 DIAGNOSIS — N183 Chronic kidney disease, stage 3 unspecified: Secondary | ICD-10-CM

## 2016-10-20 DIAGNOSIS — I10 Essential (primary) hypertension: Secondary | ICD-10-CM

## 2016-10-20 NOTE — Patient Instructions (Addendum)
Stop baby aspirin.  Continue other medicines.  Flu shot today.  Return as needed or in 1 year for medicare wellness visit and physical.

## 2016-10-20 NOTE — Addendum Note (Signed)
Addended by: Brenton Grills on: 03/18/9796 92:11 AM   Modules accepted: Orders

## 2016-10-20 NOTE — Assessment & Plan Note (Signed)
Stable period without significant change in level of care needs.

## 2016-10-20 NOTE — Progress Notes (Signed)
BP (!) 120/56 (BP Location: Left Arm, Patient Position: Sitting, Cuff Size: Normal)   Pulse 76   Temp 97.6 F (36.4 C) (Oral)   Wt 119 lb 4 oz (54.1 kg)   SpO2 91%   BMI 19.25 kg/m    CC: 6 mo f/u visit Subjective:    Patient ID: [name], female    DOB: April 16, 1920, 83 y.o.   MRN: [id number]  HPI: [name] is a 83 y.o. female presenting on 10/20/2016 for 6 mo follow-up   Here with daughter today. Someone stays with her all day. Lives with one daughter, other daughter is neighbor. She does not drive of prepare meals. Daughter helps with showers.   S/p hip fracture 01/2014. Uses walker.   HTN - compliant with current regimen. No HA, vision changes, CP/tightness, SOB, leg swelling.   Relevant past medical, surgical, family and social history reviewed and updated as indicated. Interim medical history since our last visit reviewed. Allergies and medications reviewed and updated. Outpatient Medications Prior to Visit  Medication Sig Dispense Refill  . acetaminophen (TYLENOL) 325 MG tablet Take 2 tablets (650 mg total) by mouth every 6 (six) hours as needed for mild pain or moderate pain (or Fever >/= 101). 80 tablet 0  . amLODipine (NORVASC) 5 MG tablet TAKE 1 TABLET BY MOUTH EVERY EVENING 90 tablet 2  . Cholecalciferol (VITAMIN D3) 1000 UNITS CAPS Take by mouth 2 (two) times daily.      . ferrous sulfate 325 (65 FE) MG tablet Take 1 tablet (325 mg total) by mouth every Monday, Wednesday, and Friday.    . furosemide (LASIX) 20 MG tablet TAKE 1/2 TABLET BY MOUTH EVERY MORNING. 45 tablet 0  . levothyroxine (SYNTHROID, LEVOTHROID) 25 MCG tablet TAKE 1 TABLET (25 MCG TOTAL) BY MOUTH DAILY BEFORE BREAKFAST. 90 tablet 1  . Multiple Vitamins-Minerals (EYE VITAMINS & MINERALS PO) Take 1 tablet by mouth daily. Vision health    . omeprazole (PRILOSEC OTC) 20 MG tablet Take 1 tablet (20 mg total) by mouth daily as needed.    . vitamin B-12 (CYANOCOBALAMIN) 1000 MCG tablet Take 1,000  mcg by mouth daily.      Marland Kitchen aspirin 81 MG tablet Take 81 mg by mouth daily.     No facility-administered medications prior to visit.      Per HPI unless specifically indicated in ROS section below Review of Systems     Objective:    BP (!) 120/56 (BP Location: Left Arm, Patient Position: Sitting, Cuff Size: Normal)   Pulse 76   Temp 97.6 F (36.4 C) (Oral)   Wt 119 lb 4 oz (54.1 kg)   SpO2 91%   BMI 19.25 kg/m   Wt Readings from Last 3 Encounters:  10/20/16 119 lb 4 oz (54.1 kg)  04/20/16 118 lb 4 oz (53.6 kg)  04/07/16 116 lb 1.9 oz (52.7 kg)    Physical Exam  Constitutional: She appears well-developed and well-nourished. No distress.  HENT:  Mouth/Throat: Oropharynx is clear and moist. No oropharyngeal exudate.  Eyes: Pupils are equal, round, and reactive to light. Conjunctivae are normal.  Cardiovascular: Normal rate, regular rhythm, normal heart sounds and intact distal pulses.   No murmur heard. Pulmonary/Chest: Effort normal and breath sounds normal. No respiratory distress. She has no wheezes. She has no rales.  Musculoskeletal: She exhibits no edema.  Skin: Skin is warm and dry. No rash noted.  Nursing note and vitals reviewed.  Results for orders placed  or performed in visit on 10/15/16  Renal function panel  Result Value Ref Range   Sodium 143 135 - 145 mEq/L   Potassium 4.1 3.5 - 5.1 mEq/L   Chloride 106 96 - 112 mEq/L   CO2 31 19 - 32 mEq/L   Calcium 9.7 8.4 - 10.5 mg/dL   Albumin 3.8 3.5 - 5.2 g/dL   BUN 19 6 - 23 mg/dL   Creatinine, Ser 1.31 (H) 0.40 - 1.20 mg/dL   Glucose, Bld 89 70 - 99 mg/dL   Phosphorus 3.6 2.3 - 4.6 mg/dL   GFR 40.20 (L) >60.00 mL/min  Lipid panel  Result Value Ref Range   Cholesterol 154 0 - 200 mg/dL   Triglycerides 59.0 0.0 - 149.0 mg/dL   HDL 65.30 >39.00 mg/dL   VLDL 11.8 0.0 - 40.0 mg/dL   LDL Cholesterol 77 0 - 99 mg/dL   Total CHOL/HDL Ratio 2    NonHDL 88.87   VITAMIN D 25 Hydroxy (Vit-D Deficiency, Fractures)    Result Value Ref Range   VITD 81.80 30.00 - 100.00 ng/mL  TSH  Result Value Ref Range   TSH 3.80 0.35 - 4.50 uIU/mL      Assessment & Plan:  No known personal CAD hx. Will stop baby aspirin.  Problem List Items Addressed This Visit    CKD (chronic kidney disease), stage III    Chronic, stable. Continue to monitor.       Essential hypertension    Chronic, stable. Continue amlodipine.       Hypothyroidism    Chronic, stable. Continue current regimen.       Senile dementia    Stable period without significant change in level of care needs.           Follow up plan: Return in about 1 year (around 10/20/2017) for annual exam, prior fasting for blood work, medicare wellness visit.  Ria Bush, MD

## 2016-10-20 NOTE — Assessment & Plan Note (Signed)
Chronic, stable. Continue to monitor.  

## 2016-10-20 NOTE — Assessment & Plan Note (Signed)
Chronic, stable. Continue current regimen. 

## 2016-10-20 NOTE — Assessment & Plan Note (Signed)
Chronic, stable. Continue amlodipine.  

## 2016-10-21 ENCOUNTER — Ambulatory Visit: Payer: Medicare Other | Admitting: Family Medicine

## 2016-11-17 ENCOUNTER — Other Ambulatory Visit: Payer: Self-pay | Admitting: Family Medicine

## 2016-12-27 ENCOUNTER — Other Ambulatory Visit: Payer: Self-pay | Admitting: Family Medicine

## 2017-04-19 ENCOUNTER — Other Ambulatory Visit: Payer: Self-pay | Admitting: Family Medicine

## 2017-10-20 ENCOUNTER — Other Ambulatory Visit: Payer: Self-pay | Admitting: Family Medicine

## 2017-11-14 ENCOUNTER — Other Ambulatory Visit: Payer: Self-pay | Admitting: Family Medicine

## 2017-11-27 ENCOUNTER — Other Ambulatory Visit: Payer: Self-pay | Admitting: Family Medicine

## 2017-11-27 DIAGNOSIS — Z862 Personal history of diseases of the blood and blood-forming organs and certain disorders involving the immune mechanism: Secondary | ICD-10-CM

## 2017-11-27 DIAGNOSIS — E559 Vitamin D deficiency, unspecified: Secondary | ICD-10-CM

## 2017-11-27 DIAGNOSIS — N183 Chronic kidney disease, stage 3 unspecified: Secondary | ICD-10-CM

## 2017-11-27 DIAGNOSIS — E039 Hypothyroidism, unspecified: Secondary | ICD-10-CM

## 2017-11-27 DIAGNOSIS — R7303 Prediabetes: Secondary | ICD-10-CM

## 2017-11-30 ENCOUNTER — Other Ambulatory Visit: Payer: Self-pay | Admitting: Family Medicine

## 2017-12-01 ENCOUNTER — Other Ambulatory Visit (INDEPENDENT_AMBULATORY_CARE_PROVIDER_SITE_OTHER): Payer: Medicare Other

## 2017-12-01 DIAGNOSIS — N183 Chronic kidney disease, stage 3 unspecified: Secondary | ICD-10-CM

## 2017-12-01 DIAGNOSIS — E039 Hypothyroidism, unspecified: Secondary | ICD-10-CM

## 2017-12-01 DIAGNOSIS — Z862 Personal history of diseases of the blood and blood-forming organs and certain disorders involving the immune mechanism: Secondary | ICD-10-CM

## 2017-12-01 DIAGNOSIS — R7303 Prediabetes: Secondary | ICD-10-CM

## 2017-12-01 DIAGNOSIS — E559 Vitamin D deficiency, unspecified: Secondary | ICD-10-CM

## 2017-12-01 LAB — RENAL FUNCTION PANEL
ALBUMIN: 3.9 g/dL (ref 3.5–5.2)
BUN: 27 mg/dL — AB (ref 6–23)
CALCIUM: 9.7 mg/dL (ref 8.4–10.5)
CO2: 29 meq/L (ref 19–32)
Chloride: 107 mEq/L (ref 96–112)
Creatinine, Ser: 1.25 mg/dL — ABNORMAL HIGH (ref 0.40–1.20)
GFR: 42.33 mL/min — ABNORMAL LOW (ref >60.00)
Glucose, Bld: 96 mg/dL (ref 70–99)
Phosphorus: 3.5 mg/dL (ref 2.3–4.6)
Potassium: 4.2 mEq/L (ref 3.5–5.1)
Sodium: 143 mEq/L (ref 135–145)

## 2017-12-01 LAB — CBC WITH DIFFERENTIAL/PLATELET
BASOS PCT: 0.5 % (ref 0.0–3.0)
Basophils Absolute: 0 10*3/uL (ref 0.0–0.1)
Eosinophils Absolute: 0.2 10*3/uL (ref 0.0–0.7)
Eosinophils Relative: 2.8 % (ref 0.0–5.0)
HCT: 40.1 % (ref 36.0–46.0)
Hemoglobin: 13.6 g/dL (ref 12.0–15.0)
Lymphocytes Relative: 37.1 % (ref 12.0–46.0)
Lymphs Abs: 2.4 10*3/uL (ref 0.7–4.0)
MCHC: 33.8 g/dL (ref 30.0–36.0)
MCV: 93.8 fl (ref 78.0–100.0)
MONOS PCT: 6.6 % (ref 3.0–12.0)
Monocytes Absolute: 0.4 10*3/uL (ref 0.1–1.0)
Neutro Abs: 3.4 10*3/uL (ref 1.4–7.7)
Neutrophils Relative %: 53 % (ref 43.0–77.0)
Platelets: 149 10*3/uL — ABNORMAL LOW (ref 150.0–400.0)
RBC: 4.27 Mil/uL (ref 3.87–5.11)
RDW: 12.8 % (ref 11.5–15.5)
WBC: 6.4 10*3/uL (ref 4.0–10.5)

## 2017-12-01 LAB — HEMOGLOBIN A1C: Hgb A1c MFr Bld: 5.3 % (ref 4.6–6.5)

## 2017-12-01 LAB — LIPID PANEL
CHOL/HDL RATIO: 2
Cholesterol: 148 mg/dL (ref 0–200)
HDL: 65 mg/dL (ref >39.00)
LDL CALC: 71 mg/dL (ref 0–99)
NONHDL: 83.06
Triglycerides: 60 mg/dL (ref 0.0–149.0)
VLDL: 12 mg/dL (ref 0.0–40.0)

## 2017-12-01 LAB — VITAMIN D 25 HYDROXY (VIT D DEFICIENCY, FRACTURES): VITD: 80.61 ng/mL (ref 30.00–100.00)

## 2017-12-01 LAB — TSH: TSH: 3.41 u[IU]/mL (ref 0.35–4.50)

## 2017-12-01 LAB — MICROALBUMIN / CREATININE URINE RATIO
Creatinine,U: 66.2 mg/dL
Microalb Creat Ratio: 6.1 mg/g (ref 0.0–30.0)
Microalb, Ur: 4 mg/dL — ABNORMAL HIGH (ref 0.0–1.9)

## 2017-12-01 LAB — VITAMIN B12: Vitamin B-12: 527 pg/mL (ref 211–911)

## 2017-12-06 ENCOUNTER — Encounter: Payer: Self-pay | Admitting: Family Medicine

## 2017-12-06 ENCOUNTER — Ambulatory Visit (INDEPENDENT_AMBULATORY_CARE_PROVIDER_SITE_OTHER): Payer: Medicare Other | Admitting: Family Medicine

## 2017-12-06 VITALS — BP 124/62 | HR 77 | Temp 97.6°F | Ht 63.0 in | Wt 118.2 lb

## 2017-12-06 DIAGNOSIS — N183 Chronic kidney disease, stage 3 unspecified: Secondary | ICD-10-CM

## 2017-12-06 DIAGNOSIS — Z23 Encounter for immunization: Secondary | ICD-10-CM | POA: Diagnosis not present

## 2017-12-06 DIAGNOSIS — I5032 Chronic diastolic (congestive) heart failure: Secondary | ICD-10-CM

## 2017-12-06 DIAGNOSIS — I10 Essential (primary) hypertension: Secondary | ICD-10-CM

## 2017-12-06 DIAGNOSIS — Z0000 Encounter for general adult medical examination without abnormal findings: Secondary | ICD-10-CM | POA: Diagnosis not present

## 2017-12-06 DIAGNOSIS — F0390 Unspecified dementia without behavioral disturbance: Secondary | ICD-10-CM

## 2017-12-06 DIAGNOSIS — E039 Hypothyroidism, unspecified: Secondary | ICD-10-CM

## 2017-12-06 DIAGNOSIS — R7303 Prediabetes: Secondary | ICD-10-CM

## 2017-12-06 MED ORDER — FUROSEMIDE 20 MG PO TABS: 10.0000 mg | ORAL_TABLET | Freq: Every morning | ORAL | 3 refills | Status: DC

## 2017-12-06 MED ORDER — LEVOTHYROXINE SODIUM 25 MCG PO TABS: ORAL_TABLET | ORAL | 3 refills | Status: DC

## 2017-12-06 MED ORDER — AMLODIPINE BESYLATE 5 MG PO TABS: 5.0000 mg | ORAL_TABLET | Freq: Every evening | ORAL | 3 refills | Status: DC

## 2017-12-06 NOTE — Assessment & Plan Note (Signed)
Stable period. No change in level of care need.  Knows month and year. Does puzzles regularly.  Lives with family. Encouraged ongoing social engagement around family and friends.

## 2017-12-06 NOTE — Assessment & Plan Note (Signed)
Chronic, stable. Continue to monitor.  

## 2017-12-06 NOTE — Assessment & Plan Note (Signed)

## 2017-12-06 NOTE — Progress Notes (Signed)
BP 124/62 (BP Location: Left Arm, Patient Position: Sitting, Cuff Size: Normal)   Pulse 77   Temp 97.6 F (36.4 C) (Oral)   Ht 5\' 3"  (1.6 m)   Wt 118 lb 4 oz (53.6 kg)   SpO2 96%   BMI 20.95 kg/m    CC: AMW/CPE Subjective:    Patient ID: [name], female    DOB: [birth year], 84 y.o.   MRN: [id number]  HPI: [name] is a 84 y.o. female presenting on 12/06/2017 for Annual Exam (Pt 2. Pt accompanied by her daughter, Bonnita Nasuti and granddaughter, Kenney Houseman. )   Did not see Katha Cabal this year. Here with daughter and grand daughter Kenney Houseman today.   Would like place on foot checked - not tender or bothersome.  No trouble with constipation Urinary incontinence - uses depends regularly, wets at night.    Hearing Screening   125Hz  250Hz  500Hz  1000Hz  2000Hz  3000Hz  4000Hz  6000Hz  8000Hz   Right ear:   40 25 25  20     Left ear:   0 20 20  25     Vision Screening Comments: Last eye exam within the last year  Falls - passed Depression - passed  Preventative: Colonoscopy 03/2010 - 1 polyp, int hemorrhoids. rec f/u with GI PRN. [name] - aged out.  Mammogram - normal 12/2013. Aged out. Declines breast exam.  Well woman - age out. No unexpected bleeding, weight changes. DEXA - unsure.  Flu shot yearly Tetanus 06/2013 Pneumovax and prevnar completed Shingles shot - 2015 Advanced directives: would want HCPOA to be any of her children. Does not want prolonged life support, does not want feeding tube or breathing tube. Discussed advanced directive. Has not set up, not interested in setting up. Seat belt use discussed.  Sunscreen use discussed. Denies changing moles on skin. Some skin changes on hands.  Non smoker.  Alcohol - none Dentist q6 mo Eye exam yearly  Married, widow 89  Lives with youngest daughter (Dot) and her husband and youngest son, other daughter lives next door Someone stays at home Occupation: retired from [employer]  59yrs Edu: some HS Retired  Relevant past  medical, surgical, family and social history reviewed and updated as indicated. Interim medical history since our last visit reviewed. Allergies and medications reviewed and updated. Outpatient Medications Prior to Visit  Medication Sig Dispense Refill  . acetaminophen (TYLENOL) 325 MG tablet Take 2 tablets (650 mg total) by mouth every 6 (six) hours as needed for mild pain or moderate pain (or Fever >/= 101). 80 tablet 0  . Cholecalciferol (VITAMIN D3) 1000 UNITS CAPS Take by mouth 2 (two) times daily.      . ferrous sulfate 325 (65 FE) MG tablet Take 1 tablet (325 mg total) by mouth every Monday, Wednesday, and Friday.    . Multiple Vitamins-Minerals (EYE VITAMINS & MINERALS PO) Take 1 tablet by mouth daily. Vision health    . omeprazole (PRILOSEC OTC) 20 MG tablet Take 1 tablet (20 mg total) by mouth daily as needed.    . vitamin B-12 (CYANOCOBALAMIN) 1000 MCG tablet Take 1,000 mcg by mouth daily.      Marland Kitchen amLODipine (NORVASC) 5 MG tablet TAKE 1 TABLET BY MOUTH EVERY EVENING 90 tablet 0  . furosemide (LASIX) 20 MG tablet TAKE 1/2 TABLET BY MOUTH EVERY MORNING. 45 tablet 0  . levothyroxine (SYNTHROID, LEVOTHROID) 25 MCG tablet TAKE 1 TABLET (25 MCG TOTAL) BY MOUTH DAILY BEFORE BREAKFAST. 90 tablet 0   No facility-administered medications  prior to visit.      Per HPI unless specifically indicated in ROS section below Review of Systems  Constitutional: Negative for activity change, appetite change, chills, fatigue, fever and unexpected weight change.  HENT: Negative for hearing loss.   Eyes: Negative for visual disturbance.  Respiratory: Negative for cough, chest tightness, shortness of breath and wheezing.   Cardiovascular: Negative for chest pain, palpitations and leg swelling.  Gastrointestinal: Negative for abdominal distention, abdominal pain, blood in stool, constipation, diarrhea, nausea and vomiting.  Genitourinary: Negative for difficulty urinating and hematuria.  Musculoskeletal:  Negative for arthralgias, myalgias and neck pain.  Skin: Negative for rash.  Neurological: Negative for dizziness, seizures, syncope and headaches.  Hematological: Negative for adenopathy. Does not bruise/bleed easily.  Psychiatric/Behavioral: Negative for dysphoric mood. The patient is not nervous/anxious.        Objective:    BP 124/62 (BP Location: Left Arm, Patient Position: Sitting, Cuff Size: Normal)   Pulse 77   Temp 97.6 F (36.4 C) (Oral)   Ht 5\' 3"  (1.6 m)   Wt 118 lb 4 oz (53.6 kg)   SpO2 96%   BMI 20.95 kg/m   Wt Readings from Last 3 Encounters:  12/06/17 118 lb 4 oz (53.6 kg)  10/20/16 119 lb 4 oz (54.1 kg)  04/20/16 118 lb 4 oz (53.6 kg)    Physical Exam  Constitutional: She is oriented to person, place, and time. She appears well-developed and well-nourished. No distress.  HENT:  Head: Normocephalic and atraumatic.  Right Ear: Hearing, tympanic membrane, external ear and ear canal normal.  Left Ear: Hearing, tympanic membrane, external ear and ear canal normal.  Nose: Nose normal.  Mouth/Throat: Uvula is midline, oropharynx is clear and moist and mucous membranes are normal. No oropharyngeal exudate, posterior oropharyngeal edema or posterior oropharyngeal erythema.  Eyes: Pupils are equal, round, and reactive to light. Conjunctivae and EOM are normal. No scleral icterus.  Neck: Normal range of motion. Neck supple. No thyromegaly present.  Cardiovascular: Normal rate, regular rhythm, normal heart sounds and intact distal pulses.  No murmur heard. Pulses:      Radial pulses are 2+ on the right side, and 2+ on the left side.  Pulmonary/Chest: Effort normal and breath sounds normal. No respiratory distress. She has no wheezes. She has no rales.  Abdominal: Soft. Bowel sounds are normal. She exhibits no distension and no mass. There is no tenderness. There is no rebound and no guarding.  Musculoskeletal: Normal range of motion. She exhibits no edema.    Lymphadenopathy:    She has no cervical adenopathy.  Neurological: She is alert and oriented to person, place, and time.  CN grossly intact, station and gait intact  Skin: Skin is warm and dry. No rash noted.  Psychiatric: She has a normal mood and affect. Her behavior is normal. Judgment and thought content normal.  Nursing note and vitals reviewed.  Results for orders placed or performed in visit on 12/01/17  Microalbumin / creatinine urine ratio  Result Value Ref Range   Microalb, Ur 4.0 (H) 0.0 - 1.9 mg/dL   Creatinine,U 66.2 mg/dL   Microalb Creat Ratio 6.1 0.0 - 30.0 mg/g  CBC with Differential/Platelet  Result Value Ref Range   WBC 6.4 4.0 - 10.5 K/uL   RBC 4.27 3.87 - 5.11 Mil/uL   Hemoglobin 13.6 12.0 - 15.0 g/dL   HCT 40.1 36.0 - 46.0 %   MCV 93.8 78.0 - 100.0 fl   MCHC 33.8 30.0 -  36.0 g/dL   RDW 12.8 11.5 - 15.5 %   Platelets 149.0 (L) 150.0 - 400.0 K/uL   Neutrophils Relative % 53.0 43.0 - 77.0 %   Lymphocytes Relative 37.1 12.0 - 46.0 %   Monocytes Relative 6.6 3.0 - 12.0 %   Eosinophils Relative 2.8 0.0 - 5.0 %   Basophils Relative 0.5 0.0 - 3.0 %   Neutro Abs 3.4 1.4 - 7.7 K/uL   Lymphs Abs 2.4 0.7 - 4.0 K/uL   Monocytes Absolute 0.4 0.1 - 1.0 K/uL   Eosinophils Absolute 0.2 0.0 - 0.7 K/uL   Basophils Absolute 0.0 0.0 - 0.1 K/uL  Renal function panel  Result Value Ref Range   Sodium 143 135 - 145 mEq/L   Potassium 4.2 3.5 - 5.1 mEq/L   Chloride 107 96 - 112 mEq/L   CO2 29 19 - 32 mEq/L   Calcium 9.7 8.4 - 10.5 mg/dL   Albumin 3.9 3.5 - 5.2 g/dL   BUN 27 (H) 6 - 23 mg/dL   Creatinine, Ser 1.25 (H) 0.40 - 1.20 mg/dL   Glucose, Bld 96 70 - 99 mg/dL   Phosphorus 3.5 2.3 - 4.6 mg/dL   GFR 42.33 (L) >60.00 mL/min  Lipid panel  Result Value Ref Range   Cholesterol 148 0 - 200 mg/dL   Triglycerides 60.0 0.0 - 149.0 mg/dL   HDL 65.00 >39.00 mg/dL   VLDL 12.0 0.0 - 40.0 mg/dL   LDL Cholesterol 71 0 - 99 mg/dL   Total CHOL/HDL Ratio 2    NonHDL 83.06    TSH  Result Value Ref Range   TSH 3.41 0.35 - 4.50 uIU/mL  VITAMIN D 25 Hydroxy (Vit-D Deficiency, Fractures)  Result Value Ref Range   VITD 80.61 30.00 - 100.00 ng/mL  Vitamin B12  Result Value Ref Range   Vitamin B-12 527 211 - 911 pg/mL  Hemoglobin A1c  Result Value Ref Range   Hgb A1c MFr Bld 5.3 4.6 - 6.5 %      Assessment & Plan:   Problem List Items Addressed This Visit    Senile dementia ([place])    Stable period. No change in level of care need.  Knows month and year. Does puzzles regularly.  Lives with family. Encouraged ongoing social engagement around family and friends.       RESOLVED: Prediabetes    This has normalized. Will resolve.       Medicare annual wellness visit, subsequent - Primary    I have personally reviewed the Medicare Annual Wellness questionnaire and have noted 1. The patient's medical and social history 2. Their use of alcohol, tobacco or illicit drugs 3. Their current medications and supplements 4. The patient's functional ability including ADL's, fall risks, home safety risks and hearing or visual impairment. Cognitive function has been assessed and addressed as indicated.  5. Diet and physical activity 6. Evidence for depression or mood disorders The patients weight, height, BMI have been recorded in the chart. I have made referrals, counseling and provided education to the patient based on review of the above and I have provided the pt with a written personalized care plan for preventive services. Provider list updated.. See scanned questionairre as needed for further documentation. Reviewed preventative protocols and updated unless pt declined.       Hypothyroidism    Chronic, stable. Continue current regimen.       Relevant Medications   levothyroxine (SYNTHROID, LEVOTHROID) 25 MCG tablet   Health maintenance examination  Preventative protocols reviewed and updated unless pt declined. Discussed healthy diet and lifestyle.        Essential hypertension    Chronic, stable. Continue current regimen.       Relevant Medications   amLODipine (NORVASC) 5 MG tablet   furosemide (LASIX) 20 MG tablet   CKD (chronic kidney disease), stage III (HCC)    Chronic, stable. Continue to monitor.       Chronic diastolic CHF (congestive heart failure) (HCC)    Chronic, stable on low dose lasix      Relevant Medications   amLODipine (NORVASC) 5 MG tablet   furosemide (LASIX) 20 MG tablet    Other Visit Diagnoses    Need for influenza vaccination       Relevant Orders   Flu Vaccine QUAD 36+ mos IM (Completed)       Meds ordered this encounter  Medications  . amLODipine (NORVASC) 5 MG tablet    Sig: Take 1 tablet (5 mg total) by mouth every evening.    Dispense:  90 tablet    Refill:  3  . furosemide (LASIX) 20 MG tablet    Sig: Take 0.5 tablets (10 mg total) by mouth every morning.    Dispense:  45 tablet    Refill:  3  . levothyroxine (SYNTHROID, LEVOTHROID) 25 MCG tablet    Sig: TAKE 1 TABLET (25 MCG TOTAL) BY MOUTH DAILY BEFORE BREAKFAST.    Dispense:  90 tablet    Refill:  3   Orders Placed This Encounter  Procedures  . Flu Vaccine QUAD 36+ mos IM    Follow up plan: Return in about 1 year (around 12/07/2018) for medicare wellness visit, annual exam, prior fasting for blood work.  Ria Bush, MD

## 2017-12-06 NOTE — Assessment & Plan Note (Signed)
Chronic, stable. Continue current regimen. 

## 2017-12-06 NOTE — Assessment & Plan Note (Signed)
Chronic, stable on low dose lasix

## 2017-12-06 NOTE — Assessment & Plan Note (Signed)
Preventative protocols reviewed and updated unless pt declined. Discussed healthy diet and lifestyle.  

## 2017-12-06 NOTE — Assessment & Plan Note (Signed)
This has normalized. Will resolve.

## 2017-12-06 NOTE — Patient Instructions (Addendum)
Flu shot today Happy early birthday! Return as needed or in 1 year for next physical. Health Maintenance, Female Adopting a healthy lifestyle and getting preventive care can go a long way to promote health and wellness. Talk with your health care provider about what schedule of regular examinations is right for you. This is a good chance for you to check in with your provider about disease prevention and staying healthy. In between checkups, there are plenty of things you can do on your own. Experts have done a lot of research about which lifestyle changes and preventive measures are most likely to keep you healthy. Ask your health care provider for more information. Weight and diet Eat a healthy diet  Be sure to include plenty of vegetables, fruits, low-fat dairy products, and lean protein.  Do not eat a lot of foods high in solid fats, added sugars, or salt.  Get regular exercise. This is one of the most important things you can do for your health. ? Most adults should exercise for at least 150 minutes each week. The exercise should increase your heart rate and make you sweat (moderate-intensity exercise). ? Most adults should also do strengthening exercises at least twice a week. This is in addition to the moderate-intensity exercise.  Maintain a healthy weight  Body mass index (BMI) is a measurement that can be used to identify possible weight problems. It estimates body fat based on height and weight. Your health care provider can help determine your BMI and help you achieve or maintain a healthy weight.  For females 39 years of age and older: ? A BMI below 18.5 is considered underweight. ? A BMI of 18.5 to 24.9 is normal. ? A BMI of 25 to 29.9 is considered overweight. ? A BMI of 30 and above is considered obese.  Watch levels of cholesterol and blood lipids  You should start having your blood tested for lipids and cholesterol at 82 years of age, then have this test every 5  years.  You may need to have your cholesterol levels checked more often if: ? Your lipid or cholesterol levels are high. ? You are older than 82 years of age. ? You are at high risk for heart disease.  Cancer screening Lung Cancer  Lung cancer screening is recommended for adults 53-65 years old who are at high risk for lung cancer because of a history of smoking.  A yearly low-dose CT scan of the lungs is recommended for people who: ? Currently smoke. ? Have quit within the past 15 years. ? Have at least a 30-pack-year history of smoking. A pack year is smoking an average of one pack of cigarettes a day for 1 year.  Yearly screening should continue until it has been 15 years since you quit.  Yearly screening should stop if you develop a health problem that would prevent you from having lung cancer treatment.  Breast Cancer  Practice breast self-awareness. This means understanding how your breasts normally appear and feel.  It also means doing regular breast self-exams. Let your health care provider know about any changes, no matter how small.  If you are in your 20s or 30s, you should have a clinical breast exam (CBE) by a health care provider every 1-3 years as part of a regular health exam.  If you are 57 or older, have a CBE every year. Also consider having a breast X-ray (mammogram) every year.  If you have a family history of breast cancer,  talk to your health care provider about genetic screening.  If you are at high risk for breast cancer, talk to your health care provider about having an MRI and a mammogram every year.  Breast cancer gene (BRCA) assessment is recommended for women who have family members with BRCA-related cancers. BRCA-related cancers include: ? Breast. ? Ovarian. ? Tubal. ? Peritoneal cancers.  Results of the assessment will determine the need for genetic counseling and BRCA1 and BRCA2 testing.  Cervical Cancer Your health care provider may  recommend that you be screened regularly for cancer of the pelvic organs (ovaries, uterus, and vagina). This screening involves a pelvic examination, including checking for microscopic changes to the surface of your cervix (Pap test). You may be encouraged to have this screening done every 3 years, beginning at age 51.  For women ages 66-65, health care providers may recommend pelvic exams and Pap testing every 3 years, or they may recommend the Pap and pelvic exam, combined with testing for human papilloma virus (HPV), every 5 years. Some types of HPV increase your risk of cervical cancer. Testing for HPV may also be done on women of any age with unclear Pap test results.  Other health care providers may not recommend any screening for nonpregnant women who are considered low risk for pelvic cancer and who do not have symptoms. Ask your health care provider if a screening pelvic exam is right for you.  If you have had past treatment for cervical cancer or a condition that could lead to cancer, you need Pap tests and screening for cancer for at least 20 years after your treatment. If Pap tests have been discontinued, your risk factors (such as having a new sexual partner) need to be reassessed to determine if screening should resume. Some women have medical problems that increase the chance of getting cervical cancer. In these cases, your health care provider may recommend more frequent screening and Pap tests.  Colorectal Cancer  This type of cancer can be detected and often prevented.  Routine colorectal cancer screening usually begins at 82 years of age and continues through 82 years of age.  Your health care provider may recommend screening at an earlier age if you have risk factors for colon cancer.  Your health care provider may also recommend using home test kits to check for hidden blood in the stool.  A small camera at the end of a tube can be used to examine your colon directly  (sigmoidoscopy or colonoscopy). This is done to check for the earliest forms of colorectal cancer.  Routine screening usually begins at age 16.  Direct examination of the colon should be repeated every 5-10 years through 82 years of age. However, you may need to be screened more often if early forms of precancerous polyps or small growths are found.  Skin Cancer  Check your skin from head to toe regularly.  Tell your health care provider about any new moles or changes in moles, especially if there is a change in a mole's shape or color.  Also tell your health care provider if you have a mole that is larger than the size of a pencil eraser.  Always use sunscreen. Apply sunscreen liberally and repeatedly throughout the day.  Protect yourself by wearing long sleeves, pants, a wide-brimmed hat, and sunglasses whenever you are outside.  Heart disease, diabetes, and high blood pressure  High blood pressure causes heart disease and increases the risk of stroke. High blood pressure is  more likely to develop in: ? People who have blood pressure in the high end of the normal range (130-139/85-89 mm Hg). ? People who are overweight or obese. ? People who are African American.  If you are 1-44 years of age, have your blood pressure checked every 3-5 years. If you are 35 years of age or older, have your blood pressure checked every year. You should have your blood pressure measured twice-once when you are at a hospital or clinic, and once when you are not at a hospital or clinic. Record the average of the two measurements. To check your blood pressure when you are not at a hospital or clinic, you can use: ? An automated blood pressure machine at a pharmacy. ? A home blood pressure monitor.  If you are between 71 years and 69 years old, ask your health care provider if you should take aspirin to prevent strokes.  Have regular diabetes screenings. This involves taking a blood sample to check your  fasting blood sugar level. ? If you are at a normal weight and have a low risk for diabetes, have this test once every three years after 82 years of age. ? If you are overweight and have a high risk for diabetes, consider being tested at a younger age or more often. Preventing infection Hepatitis B  If you have a higher risk for hepatitis B, you should be screened for this virus. You are considered at high risk for hepatitis B if: ? You were born in a country where hepatitis B is common. Ask your health care provider which countries are considered high risk. ? Your parents were born in a high-risk country, and you have not been immunized against hepatitis B (hepatitis B vaccine). ? You have HIV or AIDS. ? You use needles to inject street drugs. ? You live with someone who has hepatitis B. ? You have had sex with someone who has hepatitis B. ? You get hemodialysis treatment. ? You take certain medicines for conditions, including cancer, organ transplantation, and autoimmune conditions.  Hepatitis C  Blood testing is recommended for: ? Everyone born from [birth year] through 1965. ? Anyone with known risk factors for hepatitis C.  Sexually transmitted infections (STIs)  You should be screened for sexually transmitted infections (STIs) including gonorrhea and chlamydia if: ? You are sexually active and are younger than 82 years of age. ? You are older than 82 years of age and your health care provider tells you that you are at risk for this type of infection. ? Your sexual activity has changed since you were last screened and you are at an increased risk for chlamydia or gonorrhea. Ask your health care provider if you are at risk.  If you do not have HIV, but are at risk, it may be recommended that you take a prescription medicine daily to prevent HIV infection. This is called pre-exposure prophylaxis (PrEP). You are considered at risk if: ? You are sexually active and do not regularly use condoms  or know the HIV status of your partner(s). ? You take drugs by injection. ? You are sexually active with a partner who has HIV.  Talk with your health care provider about whether you are at high risk of being infected with HIV. If you choose to begin PrEP, you should first be tested for HIV. You should then be tested every 3 months for as long as you are taking PrEP. Pregnancy  If you are premenopausal and you  may become pregnant, ask your health care provider about preconception counseling.  If you may become pregnant, take 400 to 800 micrograms (mcg) of folic acid every day.  If you want to prevent pregnancy, talk to your health care provider about birth control (contraception). Osteoporosis and menopause  Osteoporosis is a disease in which the bones lose minerals and strength with aging. This can result in serious bone fractures. Your risk for osteoporosis can be identified using a bone density scan.  If you are 10 years of age or older, or if you are at risk for osteoporosis and fractures, ask your health care provider if you should be screened.  Ask your health care provider whether you should take a calcium or vitamin D supplement to lower your risk for osteoporosis.  Menopause may have certain physical symptoms and risks.  Hormone replacement therapy may reduce some of these symptoms and risks. Talk to your health care provider about whether hormone replacement therapy is right for you. Follow these instructions at home:  Schedule regular health, dental, and eye exams.  Stay current with your immunizations.  Do not use any tobacco products including cigarettes, chewing tobacco, or electronic cigarettes.  If you are pregnant, do not drink alcohol.  If you are breastfeeding, limit how much and how often you drink alcohol.  Limit alcohol intake to no more than 1 drink per day for nonpregnant women. One drink equals 12 ounces of beer, 5 ounces of wine, or 1 ounces of hard  liquor.  Do not use street drugs.  Do not share needles.  Ask your health care provider for help if you need support or information about quitting drugs.  Tell your health care provider if you often feel depressed.  Tell your health care provider if you have ever been abused or do not feel safe at home. This information is not intended to replace advice given to you by your health care provider. Make sure you discuss any questions you have with your health care provider. Document Released: 07/27/2010 Document Revised: 06/19/2015 Document Reviewed: 10/15/2014 Elsevier Interactive Patient Education  [employer].

## 2018-12-11 ENCOUNTER — Telehealth: Payer: Self-pay

## 2018-12-11 NOTE — Telephone Encounter (Signed)
[facility] Night - Client TELEPHONE ADVICE RECORD [facility] Patient Name: [name] Gender: Female DOB: 1920-07-28 Age: 85 Y Return Phone Number: [phone] (Primary), [phone] (Secondary) Address: City/State/Zip: [place] [address] Client [facility] Night - Client Client Site [place] Physician Ria Bush - MD Contact Type Call Who Is Calling Patient / Member / Family / Caregiver Call Type Triage / Clinical Caller Name Kenney Houseman May Relationship To Patient Daughter Return Phone Number [phone] (Primary) Chief Complaint Fall - No Injury Reason for Call Symptomatic / Request for [place] stated grandmother fell last night. Caller states she is hurting from the fall. Caller states she did not faint. Translation No Nurse Assessment Nurse: Ysidro Evert, RN, Levada Dy Date/Time Eilene Ghazi Time): 12/10/2018 12:43:23 PM Confirm and document reason for call. If symptomatic, describe symptoms. ---Caller states her grandmother had a fall last night. She was checked by EMTs and they didn't think she needed to go the hospital. She was complaining of having pain last night but none today. She has some bruising and some skin tears Has the patient had close contact with a person known or suspected to have the novel coronavirus illness OR traveled / lives in area with major community spread (including international travel) in the last 14 days from the onset of symptoms? * If Asymptomatic, screen for exposure and travel within the last 14 days. ---No Does the patient have any new or worsening symptoms? ---Yes Will a triage be completed? ---Yes Related visit to physician within the last 2 weeks? ---No Does the PT have any chronic conditions? (i.e. diabetes, asthma, this includes High risk factors for pregnancy, etc.) ---No Is this a behavioral health or substance  abuse call? ---No Guidelines Guideline Title Affirmed Question Affirmed Notes Nurse Date/Time Eilene Ghazi Time) Head Injury ALSO, superficial cut (scratch) or abrasion (scrape) is present Ysidro Evert, Therapist, sports, Levada Dy 12/10/2018 12:47:04 PM PLEASE NOTE: All timestamps contained within this report are represented as [place] Standard Time. CONFIDENTIALTY NOTICE: This fax transmission is intended only for the addressee. It contains information that is legally privileged, confidential or otherwise protected from use or disclosure. If you are not the intended recipient, you are strictly prohibited from reviewing, disclosing, copying using or disseminating any of this information or taking any action in reliance on or regarding this information. If you have received this fax in error, please notify us immediately by telephone so that we can arrange for its return to [name]. Phone: [phone], Toll-Free: [phone], Fax: [phone] Page: 2 of 2 Call Id: [id number] [place]. Time Eilene Ghazi Time) Disposition Final User 12/10/2018 12:29:04 PM Send to Urgent Queue Hassie Bruce 12/10/2018 12:55:26 Berry, RN, Marin Shutter Disagree/Comply Comply Caller Understands Yes PreDisposition Did not know what to do Care Advice Given Per Guideline HOME CARE: REASSURANCE AND EDUCATION: * It sounds like a small cut or scrape that we can treat at home. * You should be able to treat this at home. CARE ADVICE given per Head Injury (Adult) guideline. CALL BACK IF: * You become worse. Referrals REFERRED TO PCP OFFICE

## 2018-12-11 NOTE — Telephone Encounter (Signed)
Appointment r/s 11/17 Tonya aware

## 2018-12-11 NOTE — Telephone Encounter (Signed)
plz schedule for tomorrow.

## 2018-12-11 NOTE — Telephone Encounter (Signed)
Phillip Heal (son in law) called to schedule appointment to change bandages from fall over weekend.  I schedule for 11/18.  Is this ok or does pt need to be seen  Sooner  Best number [phone]

## 2018-12-11 NOTE — Telephone Encounter (Signed)
Noted  

## 2018-12-12 ENCOUNTER — Other Ambulatory Visit: Payer: Self-pay

## 2018-12-12 ENCOUNTER — Encounter: Payer: Self-pay | Admitting: Family Medicine

## 2018-12-12 ENCOUNTER — Ambulatory Visit (INDEPENDENT_AMBULATORY_CARE_PROVIDER_SITE_OTHER): Payer: Medicare Other | Admitting: Family Medicine

## 2018-12-12 VITALS — BP 134/64 | HR 83 | Temp 98.3°F | Ht 63.0 in | Wt 124.1 lb

## 2018-12-12 DIAGNOSIS — W19XXXA Unspecified fall, initial encounter: Secondary | ICD-10-CM | POA: Diagnosis not present

## 2018-12-12 DIAGNOSIS — S41112A Laceration without foreign body of left upper arm, initial encounter: Secondary | ICD-10-CM

## 2018-12-12 DIAGNOSIS — S81801A Unspecified open wound, right lower leg, initial encounter: Secondary | ICD-10-CM

## 2018-12-12 DIAGNOSIS — Z23 Encounter for immunization: Secondary | ICD-10-CM

## 2018-12-12 DIAGNOSIS — F0390 Unspecified dementia without behavioral disturbance: Secondary | ICD-10-CM

## 2018-12-12 NOTE — Patient Instructions (Addendum)
Flu shot today Change dressings daily to left arm I want to refer you to wound clinic for large left leg wound - see our referral coordinator today

## 2018-12-12 NOTE — Progress Notes (Signed)
This visit was conducted in person.  BP 134/64 (BP Location: Right Arm, Patient Position: Sitting, Cuff Size: Normal)   Pulse 83   Temp 98.3 F (36.8 C) (Temporal)   Ht 5\' 3"  (1.6 m)   Wt 124 lb 1 oz (56.3 kg)   SpO2 100%   BMI 21.98 kg/m    CC: fall Subjective:    Patient ID: [name], female    DOB: [birth year], 85 y.o.   MRN: [id number]  HPI: [name] is a 85 y.o. female presenting on 12/12/2018 for Wound Check (Here for wound check sustained to left arm and leg due to a fall in the bathroom on 12/09/18.), Cyst (Has cyst on left side of forehead.  Per daughter, pt picks at it regularly. ), and Foot Problem (C/o sore on right foot. )   Here with daughter today.   DOI: 12/09/2018 Saturday night she suffered a fall in the bathroom, L arm hit sink, R leg with skin tear - paramedics bandaged wounds. No head injury.   Sores on R foot for last few months - treated with medicated pad with benefit.      Relevant past medical, surgical, family and social history reviewed and updated as indicated. Interim medical history since our last visit reviewed. Allergies and medications reviewed and updated. Outpatient Medications Prior to Visit  Medication Sig Dispense Refill  . acetaminophen (TYLENOL) 325 MG tablet Take 2 tablets (650 mg total) by mouth every 6 (six) hours as needed for mild pain or moderate pain (or Fever >/= 101). 80 tablet 0  . amLODipine (NORVASC) 5 MG tablet Take 1 tablet (5 mg total) by mouth every evening. 90 tablet 3  . Cholecalciferol (VITAMIN D3) 1000 UNITS CAPS Take by mouth 2 (two) times daily.      . ferrous sulfate 325 (65 FE) MG tablet Take 1 tablet (325 mg total) by mouth every Monday, Wednesday, and Friday.    . furosemide (LASIX) 20 MG tablet Take 0.5 tablets (10 mg total) by mouth every morning. 45 tablet 3  . levothyroxine (SYNTHROID, LEVOTHROID) 25 MCG tablet TAKE 1 TABLET (25 MCG TOTAL) BY MOUTH DAILY BEFORE BREAKFAST. 90 tablet 3  .  Multiple Vitamins-Minerals (EYE VITAMINS & MINERALS PO) Take 1 tablet by mouth daily. [facility]    . omeprazole (PRILOSEC OTC) 20 MG tablet Take 1 tablet (20 mg total) by mouth daily as needed.    . vitamin B-12 (CYANOCOBALAMIN) 1000 MCG tablet Take 1,000 mcg by mouth daily.       No facility-administered medications prior to visit.      Per HPI unless specifically indicated in ROS section below Review of Systems Objective:    BP 134/64 (BP Location: Right Arm, Patient Position: Sitting, Cuff Size: Normal)   Pulse 83   Temp 98.3 F (36.8 C) (Temporal)   Ht 5\' 3"  (1.6 m)   Wt 124 lb 1 oz (56.3 kg)   SpO2 100%   BMI 21.98 kg/m   Wt Readings from Last 3 Encounters:  12/12/18 124 lb 1 oz (56.3 kg)  12/06/17 118 lb 4 oz (53.6 kg)  10/20/16 119 lb 4 oz (54.1 kg)    Physical Exam Vitals signs and nursing note reviewed.  Constitutional:      General: She is not in acute distress.    Appearance: Normal appearance. She is not ill-appearing.  Skin:    General: Skin is warm and dry.     Findings: Wound present.  Comments:  1. L proximal upper arm with small skin tear and forearm with 2cm laceration, both cleaned with NS and dressed with abx ointment and non stick gauze and kerlix 2. R calf with large 5 inch x7 inch skin tear - initial dressing removed and redressed with abd pad and kerlix  Neurological:     Mental Status: She is alert.       Results for orders placed or performed in visit on 12/01/17  Microalbumin / creatinine urine ratio  Result Value Ref Range   Microalb, Ur 4.0 (H) 0.0 - 1.9 mg/dL   Creatinine,U 66.2 mg/dL   Microalb Creat Ratio 6.1 0.0 - 30.0 mg/g  CBC with Differential/Platelet  Result Value Ref Range   WBC 6.4 4.0 - 10.5 K/uL   RBC 4.27 3.87 - 5.11 Mil/uL   Hemoglobin 13.6 12.0 - 15.0 g/dL   HCT 40.1 36.0 - 46.0 %   MCV 93.8 78.0 - 100.0 fl   MCHC 33.8 30.0 - 36.0 g/dL   RDW 12.8 11.5 - 15.5 %   Platelets 149.0 (L) 150.0 - 400.0 K/uL    Neutrophils Relative % 53.0 43.0 - 77.0 %   Lymphocytes Relative 37.1 12.0 - 46.0 %   Monocytes Relative 6.6 3.0 - 12.0 %   Eosinophils Relative 2.8 0.0 - 5.0 %   Basophils Relative 0.5 0.0 - 3.0 %   Neutro Abs 3.4 1.4 - 7.7 K/uL   Lymphs Abs 2.4 0.7 - 4.0 K/uL   Monocytes Absolute 0.4 0.1 - 1.0 K/uL   Eosinophils Absolute 0.2 0.0 - 0.7 K/uL   Basophils Absolute 0.0 0.0 - 0.1 K/uL  Renal function panel  Result Value Ref Range   Sodium 143 135 - 145 mEq/L   Potassium 4.2 3.5 - 5.1 mEq/L   Chloride 107 96 - 112 mEq/L   CO2 29 19 - 32 mEq/L   Calcium 9.7 8.4 - 10.5 mg/dL   Albumin 3.9 3.5 - 5.2 g/dL   BUN 27 (H) 6 - 23 mg/dL   Creatinine, Ser 1.25 (H) 0.40 - 1.20 mg/dL   Glucose, Bld 96 70 - 99 mg/dL   Phosphorus 3.5 2.3 - 4.6 mg/dL   GFR 42.33 (L) >60.00 mL/min  Lipid panel  Result Value Ref Range   Cholesterol 148 0 - 200 mg/dL   Triglycerides 60.0 0.0 - 149.0 mg/dL   HDL 65.00 >39.00 mg/dL   VLDL 12.0 0.0 - 40.0 mg/dL   LDL Cholesterol 71 0 - 99 mg/dL   Total CHOL/HDL Ratio 2    NonHDL 83.06   TSH  Result Value Ref Range   TSH 3.41 0.35 - 4.50 uIU/mL  VITAMIN D 25 Hydroxy (Vit-D Deficiency, Fractures)  Result Value Ref Range   VITD 80.61 30.00 - 100.00 ng/mL  Vitamin B12  Result Value Ref Range   Vitamin B-12 527 211 - 911 pg/mL  Hemoglobin A1c  Result Value Ref Range   Hgb A1c MFr Bld 5.3 4.6 - 6.5 %   Assessment & Plan:   Problem List Items Addressed This Visit    Skin tear of left upper arm without complication    2 separate tears without complication - dressed, home instructions provided.       Senile dementia ([place])    This can complicate care      Leg wound, right, initial encounter - Primary    Fall with skin tear resulting in large wound to R calf.  Large enough that she would benefit from  wound clinic eval - will try to expedite. They are unable to see - will ask [facility] wound nurse to come out to the house for dressing changes until seen by wound clinic.  Would recommend daily dressing changes.       Relevant Orders   Ambulatory referral to [facility]   Ambulatory referral to [facility] with injury   Relevant Orders   Ambulatory referral to [facility]    Other Visit Diagnoses    Need for influenza vaccination       Relevant Orders   Flu Vaccine QUAD High Dose(Fluad) (Completed)       No orders of the defined types were placed in this encounter.  Orders Placed This Encounter  Procedures  . Flu Vaccine QUAD High Dose(Fluad)  . Ambulatory referral to Wound Clinic    Referral Priority:   Urgent    Referral Type:   Consultation    Referral Reason:   Specialty Services Required    Requested Specialty:   Wound Care    Number of Visits Requested:   1  . Ambulatory referral to Home Health    Referral Priority:   Routine    Referral Type:   Home Health Care    Referral Reason:   Specialty Services Required    Requested Specialty:   [place]    Number of Visits Requested:   1    Follow up plan: No follow-ups on file.  Ria Bush, MD

## 2018-12-13 ENCOUNTER — Ambulatory Visit: Payer: Medicare Other | Admitting: Family Medicine

## 2018-12-13 ENCOUNTER — Telehealth: Payer: Self-pay | Admitting: Family Medicine

## 2018-12-13 DIAGNOSIS — S51812A Laceration without foreign body of left forearm, initial encounter: Secondary | ICD-10-CM | POA: Insufficient documentation

## 2018-12-13 DIAGNOSIS — W19XXXA Unspecified fall, initial encounter: Secondary | ICD-10-CM | POA: Insufficient documentation

## 2018-12-13 DIAGNOSIS — S41112A Laceration without foreign body of left upper arm, initial encounter: Secondary | ICD-10-CM | POA: Insufficient documentation

## 2018-12-13 DIAGNOSIS — S81809A Unspecified open wound, unspecified lower leg, initial encounter: Secondary | ICD-10-CM | POA: Insufficient documentation

## 2018-12-13 DIAGNOSIS — R296 Repeated falls: Secondary | ICD-10-CM | POA: Insufficient documentation

## 2018-12-13 DIAGNOSIS — S81801D Unspecified open wound, right lower leg, subsequent encounter: Secondary | ICD-10-CM | POA: Insufficient documentation

## 2018-12-13 NOTE — Telephone Encounter (Signed)
Spoke with [name].  Given size of wound, I did recommend daughter bring her into office to have dressing changed here. She states she cannot bring her mother in today and no one else in family can bring her in today. Reviewed dressing change recommendations - non stick gauze with large amount of antibiotic ointment to wound then dressing with kerlix. rec daily dressing change. Daughter will change dressing tonight.  Will see when wound nurse can come out to the house for home wound care while she gets in to wound clinic next week.  appt cancelled.

## 2018-12-13 NOTE — Assessment & Plan Note (Signed)
Fall with skin tear resulting in large wound to R calf.  Large enough that she would benefit from wound clinic eval - will try to expedite. They are unable to see - will ask [facility] wound nurse to come out to the house for dressing changes until seen by wound clinic. Would recommend daily dressing changes.

## 2018-12-13 NOTE — Assessment & Plan Note (Signed)
This can complicate care

## 2018-12-13 NOTE — Assessment & Plan Note (Signed)
2 separate tears without complication - dressed, home instructions provided.

## 2018-12-13 NOTE — Telephone Encounter (Signed)
Patient's daughter,Helen,called.  Patient has appointment today at 3:45.  Patient said she's too busy "packing greens" to bring patient to her appointment.  She wants to know if she can bandage the wound?  She can't find anyone else to bring patient today. Bonnita Nasuti said she might be able to bring patient tomorrow.

## 2018-12-14 ENCOUNTER — Encounter: Payer: Medicare Other | Admitting: Family Medicine

## 2018-12-14 NOTE — Telephone Encounter (Signed)
Roxy Horseman son in law (DPR not signed) said that he is changing pts dressing and wants to know if has to be done daily because gauze is sticking to the area. Per note advised should use non stick gauze next to the area needing a dressing. Mr Merlyn Albert voiced understanding and said he is bringing pt to her appt on 12/15/18. Nothing further needed at this time.

## 2018-12-15 ENCOUNTER — Encounter: Payer: Medicare Other | Attending: Physician Assistant | Admitting: Physician Assistant

## 2018-12-15 ENCOUNTER — Other Ambulatory Visit: Payer: Self-pay

## 2018-12-15 ENCOUNTER — Ambulatory Visit: Payer: Medicare Other | Admitting: Family Medicine

## 2018-12-15 DIAGNOSIS — I5032 Chronic diastolic (congestive) heart failure: Secondary | ICD-10-CM | POA: Diagnosis not present

## 2018-12-15 DIAGNOSIS — N183 Chronic kidney disease, stage 3 unspecified: Secondary | ICD-10-CM | POA: Insufficient documentation

## 2018-12-15 DIAGNOSIS — L98492 Non-pressure chronic ulcer of skin of other sites with fat layer exposed: Secondary | ICD-10-CM | POA: Diagnosis not present

## 2018-12-15 DIAGNOSIS — S81801A Unspecified open wound, right lower leg, initial encounter: Secondary | ICD-10-CM | POA: Insufficient documentation

## 2018-12-15 DIAGNOSIS — S41102A Unspecified open wound of left upper arm, initial encounter: Secondary | ICD-10-CM | POA: Insufficient documentation

## 2018-12-15 DIAGNOSIS — F0390 Unspecified dementia without behavioral disturbance: Secondary | ICD-10-CM | POA: Diagnosis not present

## 2018-12-15 DIAGNOSIS — I130 Hypertensive heart and chronic kidney disease with heart failure and stage 1 through stage 4 chronic kidney disease, or unspecified chronic kidney disease: Secondary | ICD-10-CM | POA: Insufficient documentation

## 2018-12-15 DIAGNOSIS — X58XXXA Exposure to other specified factors, initial encounter: Secondary | ICD-10-CM | POA: Insufficient documentation

## 2018-12-15 DIAGNOSIS — S41112A Laceration without foreign body of left upper arm, initial encounter: Secondary | ICD-10-CM | POA: Diagnosis not present

## 2018-12-15 DIAGNOSIS — L97212 Non-pressure chronic ulcer of right calf with fat layer exposed: Secondary | ICD-10-CM | POA: Diagnosis not present

## 2018-12-15 DIAGNOSIS — S51802A Unspecified open wound of left forearm, initial encounter: Secondary | ICD-10-CM | POA: Insufficient documentation

## 2018-12-18 ENCOUNTER — Ambulatory Visit: Payer: Medicare Other | Admitting: Physician Assistant

## 2018-12-26 ENCOUNTER — Encounter: Payer: Self-pay | Admitting: Family Medicine

## 2018-12-26 ENCOUNTER — Other Ambulatory Visit: Payer: Self-pay

## 2018-12-26 ENCOUNTER — Ambulatory Visit (INDEPENDENT_AMBULATORY_CARE_PROVIDER_SITE_OTHER): Payer: Medicare Other | Admitting: Family Medicine

## 2018-12-26 VITALS — BP 110/60 | HR 82 | Temp 97.7°F | Ht 63.5 in | Wt 123.0 lb

## 2018-12-26 DIAGNOSIS — I10 Essential (primary) hypertension: Secondary | ICD-10-CM

## 2018-12-26 DIAGNOSIS — E039 Hypothyroidism, unspecified: Secondary | ICD-10-CM

## 2018-12-26 DIAGNOSIS — F0390 Unspecified dementia without behavioral disturbance: Secondary | ICD-10-CM

## 2018-12-26 DIAGNOSIS — N1831 Chronic kidney disease, stage 3a: Secondary | ICD-10-CM

## 2018-12-26 DIAGNOSIS — S41112D Laceration without foreign body of left upper arm, subsequent encounter: Secondary | ICD-10-CM

## 2018-12-26 DIAGNOSIS — Z0000 Encounter for general adult medical examination without abnormal findings: Secondary | ICD-10-CM | POA: Diagnosis not present

## 2018-12-26 DIAGNOSIS — Z862 Personal history of diseases of the blood and blood-forming organs and certain disorders involving the immune mechanism: Secondary | ICD-10-CM

## 2018-12-26 DIAGNOSIS — S81801D Unspecified open wound, right lower leg, subsequent encounter: Secondary | ICD-10-CM

## 2018-12-26 DIAGNOSIS — E559 Vitamin D deficiency, unspecified: Secondary | ICD-10-CM

## 2018-12-26 DIAGNOSIS — Z7189 Other specified counseling: Secondary | ICD-10-CM

## 2018-12-26 DIAGNOSIS — I5032 Chronic diastolic (congestive) heart failure: Secondary | ICD-10-CM

## 2018-12-26 NOTE — Patient Instructions (Addendum)
Labwork today. Continue dressing changes as directed at wound clinic.  Continue follow up with wound care clinic for leg wound Advanced directive packet provided today to look at with family.  Return as needed or in 1 year for next wellness visit   Health Maintenance After Age 83 After age 75, you are at a higher risk for certain long-term diseases and infections as well as injuries from falls. Falls are a major cause of broken bones and head injuries in people who are older than age 77. Getting regular preventive care can help to keep you healthy and well. Preventive care includes getting regular testing and making lifestyle changes as recommended by your health care provider. Talk with your health care provider about:  Which screenings and tests you should have. A screening is a test that checks for a disease when you have no symptoms.  A diet and exercise plan that is right for you. What should I know about screenings and tests to prevent falls? Screening and testing are the best ways to find a health problem early. Early diagnosis and treatment give you the best chance of managing medical conditions that are common after age 109. Certain conditions and lifestyle choices may make you more likely to have a fall. Your health care provider may recommend:  Regular vision checks. Poor vision and conditions such as cataracts can make you more likely to have a fall. If you wear glasses, make sure to get your prescription updated if your vision changes.  Medicine review. Work with your health care provider to regularly review all of the medicines you are taking, including over-the-counter medicines. Ask your health care provider about any side effects that may make you more likely to have a fall. Tell your health care provider if any medicines that you take make you feel dizzy or sleepy.  Osteoporosis screening. Osteoporosis is a condition that causes the bones to get weaker. This can make the bones  weak and cause them to break more easily.  Blood pressure screening. Blood pressure changes and medicines to control blood pressure can make you feel dizzy.  Strength and balance checks. Your health care provider may recommend certain tests to check your strength and balance while standing, walking, or changing positions.  Foot health exam. Foot pain and numbness, as well as not wearing proper footwear, can make you more likely to have a fall.  Depression screening. You may be more likely to have a fall if you have a fear of falling, feel emotionally low, or feel unable to do activities that you used to do.  Alcohol use screening. Using too much alcohol can affect your balance and may make you more likely to have a fall. What actions can I take to lower my risk of falls? General instructions  Talk with your health care provider about your risks for falling. Tell your health care provider if: ? You fall. Be sure to tell your health care provider about all falls, even ones that seem minor. ? You feel dizzy, sleepy, or off-balance.  Take over-the-counter and prescription medicines only as told by your health care provider. These include any supplements.  Eat a healthy diet and maintain a healthy weight. A healthy diet includes low-fat dairy products, low-fat (lean) meats, and fiber from whole grains, beans, and lots of fruits and vegetables. Home safety  Remove any tripping hazards, such as rugs, cords, and clutter.  Install safety equipment such as grab bars in bathrooms and safety rails on  stairs.  Keep rooms and walkways well-lit. Activity   Follow a regular exercise program to stay fit. This will help you maintain your balance. Ask your health care provider what types of exercise are appropriate for you.  If you need a cane or walker, use it as recommended by your health care provider.  Wear supportive shoes that have nonskid soles. Lifestyle  Do not drink alcohol if your  health care provider tells you not to drink.  If you drink alcohol, limit how much you have: ? 0-1 drink a day for women. ? 0-2 drinks a day for men.  Be aware of how much alcohol is in your drink. In the [place] one drink equals one typical bottle of beer (12 oz), one-half glass of wine (5 oz), or one shot of hard liquor (1 oz).  Do not use any products that contain nicotine or tobacco, such as cigarettes and e-cigarettes. If you need help quitting, ask your health care provider. Summary  Having a healthy lifestyle and getting preventive care can help to protect your health and wellness after age 48.  Screening and testing are the best way to find a health problem early and help you avoid having a fall. Early diagnosis and treatment give you the best chance for managing medical conditions that are more common for people who are older than age 21.  Falls are a major cause of broken bones and head injuries in people who are older than age 68. Take precautions to prevent a fall at home.  Work with your health care provider to learn what changes you can make to improve your health and wellness and to prevent falls. This information is not intended to replace advice given to you by your health care provider. Make sure you discuss any questions you have with your health care provider. Document Released: 11/24/2016 Document Revised: 05/04/2018 Document Reviewed: 11/24/2016 Elsevier Patient Education  2020 [employer].

## 2018-12-26 NOTE — Progress Notes (Signed)
This visit was conducted in person.  BP 110/60 (BP Location: Right Arm, Patient Position: Sitting, Cuff Size: Normal)   Pulse 82   Temp 97.7 F (36.5 C) (Temporal)   Ht 5' 3.5" (1.613 m)   Wt 123 lb (55.8 kg)   SpO2 96%   BMI 21.45 kg/m    CC: AMW Subjective:    Patient ID: [name], female    DOB: [birth year], 85 y.o.   MRN: [id number]  HPI: [name] is a 85 y.o. female presenting on 12/26/2018 for Medicare Wellness (Pt accompanied by daughter, Christiane Ha, 98.0].)   Recent bad leg wound after fall at home. Was unable to follow in office as recommended but fortunately she was able to establish care with [facility] wound clinic on 12/15/2018. Has f/u appt this Friday. Changing dressings every other day.   Did not see health advisor this year.    Hearing Screening   125Hz  250Hz  500Hz  1000Hz  2000Hz  3000Hz  4000Hz  6000Hz  8000Hz   Right ear:   20 20 25   0    Left ear:   20 25 20   40      Visual Acuity Screening   Right eye Left eye Both eyes  Without correction:     With correction: 20/70 20/70 20/70       Office Visit from 12/26/2018 in [place] at [facility] Total Score  0      Fall Risk  12/26/2018 12/06/2017 04/20/2016 01/08/2015 01/08/2015  Falls in the past year? 1 0 No Yes No  Number falls in past yr: 1 - - 1 -  Injury with Fall? 1 - - Yes -  Risk Factor Category  - - - High Fall Risk -  Risk for fall due to : - - - - -  Risk for fall due to: Comment - - - - -  Follow up - - - Falls prevention discussed -  Regularly uses walker.   Preventative: Colonoscopy 03/2010 - 1 polyp, int hemorrhoids. rec f/u with GI PRN. [name] - aged out.  Mammogram - normal 12/2013. Aged out. Declines breast exam.  Well woman - age out. No unexpected bleeding, weight changes. DEXA - unsure. Flu shotyearly Tetanus 06/2013 Pneumovax and prevnar completed Shingles shot - 2015 Advanced directives: would want HCPOA to be any of her children. Does not want  prolonged life support, does not want feeding tube or breathing tube. Discussed advanced directive. Has not set up, packet provided today.  Seat belt use discussed.  Sunscreen use discussed. Denies changing moles on skin.Some skin changes on hands.  Non smoker.  Alcohol - none  Dentist q6 mo  Eye exam yearly  Bowel - no constipation  Bladder - no incontinence. Doesn't use pads.   Married, widow 59  Lives with youngest daughter (Dot) and her husband and youngest son, other daughter lives next door Someone stays at home Occupation: retired from [employer] 47yrs Edu: some HS Retired     Relevant past medical, surgical, family and social history reviewed and updated as indicated. Interim medical history since our last visit reviewed. Allergies and medications reviewed and updated. Outpatient Medications Prior to Visit  Medication Sig Dispense Refill  . acetaminophen (TYLENOL) 325 MG tablet Take 2 tablets (650 mg total) by mouth every 6 (six) hours as needed for mild pain or moderate pain (or Fever >/= 101). 80 tablet 0  . amLODipine (NORVASC) 5 MG tablet Take 1 tablet (5 mg total) by mouth every  evening. 90 tablet 3  . Cholecalciferol (VITAMIN D3) 1000 UNITS CAPS Take by mouth 2 (two) times daily.      . ferrous sulfate 325 (65 FE) MG tablet Take 1 tablet (325 mg total) by mouth every Monday, Wednesday, and Friday.    . furosemide (LASIX) 20 MG tablet Take 0.5 tablets (10 mg total) by mouth every morning. 45 tablet 3  . levothyroxine (SYNTHROID, LEVOTHROID) 25 MCG tablet TAKE 1 TABLET (25 MCG TOTAL) BY MOUTH DAILY BEFORE BREAKFAST. 90 tablet 3  . Multiple Vitamins-Minerals (EYE VITAMINS & MINERALS PO) Take 1 tablet by mouth daily. [facility]    . omeprazole (PRILOSEC OTC) 20 MG tablet Take 1 tablet (20 mg total) by mouth daily as needed.    . vitamin B-12 (CYANOCOBALAMIN) 1000 MCG tablet Take 1,000 mcg by mouth daily.       No facility-administered medications prior to visit.       Per HPI unless specifically indicated in ROS section below Review of Systems  Constitutional: Negative for activity change, appetite change, chills, fatigue, fever and unexpected weight change.  HENT: Negative for hearing loss.   Eyes: Negative for visual disturbance.  Respiratory: Negative for cough, chest tightness, shortness of breath and wheezing.   Cardiovascular: Negative for chest pain, palpitations and leg swelling.  Gastrointestinal: Negative for abdominal distention, abdominal pain, blood in stool, constipation, diarrhea, nausea and vomiting.  Genitourinary: Negative for difficulty urinating and hematuria.  Musculoskeletal: Negative for arthralgias, myalgias and neck pain.  Skin: Negative for rash.  Neurological: Negative for dizziness, seizures, syncope and headaches.  Hematological: Negative for adenopathy. Does not bruise/bleed easily.  Psychiatric/Behavioral: Negative for dysphoric mood. The patient is not nervous/anxious.    Objective:    BP 110/60 (BP Location: Right Arm, Patient Position: Sitting, Cuff Size: Normal)   Pulse 82   Temp 97.7 F (36.5 C) (Temporal)   Ht 5' 3.5" (1.613 m)   Wt 123 lb (55.8 kg)   SpO2 96%   BMI 21.45 kg/m   Wt Readings from Last 3 Encounters:  12/26/18 123 lb (55.8 kg)  12/12/18 124 lb 1 oz (56.3 kg)  12/06/17 118 lb 4 oz (53.6 kg)    Physical Exam Vitals signs and nursing note reviewed.  Constitutional:      General: She is not in acute distress.    Appearance: Normal appearance. She is well-developed. She is not ill-appearing.     Comments: Frail appearing  HENT:     Head: Normocephalic and atraumatic.     Right Ear: Hearing, tympanic membrane, ear canal and external ear normal.     Left Ear: Hearing, tympanic membrane, ear canal and external ear normal.     Nose: Nose normal.     Mouth/Throat:     Mouth: Mucous membranes are moist.     Pharynx: Oropharynx is clear. Uvula midline. No posterior oropharyngeal erythema.   Eyes:     General: No scleral icterus.    Extraocular Movements: Extraocular movements intact.     Conjunctiva/sclera: Conjunctivae normal.     Pupils: Pupils are equal, round, and reactive to light.  Neck:     Musculoskeletal: Normal range of motion and neck supple.  Cardiovascular:     Rate and Rhythm: Normal rate and regular rhythm.     Pulses: Normal pulses.          Radial pulses are 2+ on the right side and 2+ on the left side.     Heart sounds: Normal heart  sounds. No murmur.  Pulmonary:     Effort: Pulmonary effort is normal. No respiratory distress.     Breath sounds: Normal breath sounds. No wheezing, rhonchi or rales.  Abdominal:     General: Abdomen is flat. Bowel sounds are normal. There is no distension.     Palpations: Abdomen is soft. There is no mass.     Tenderness: There is no abdominal tenderness. There is no guarding or rebound.     Hernia: No hernia is present.  Musculoskeletal: Normal range of motion.     Right lower leg: No edema.     Left lower leg: No edema.  Lymphadenopathy:     Cervical: No cervical adenopathy.  Skin:    General: Skin is warm and dry.     Findings: No rash.  Neurological:     General: No focal deficit present.     Mental Status: She is alert.     Comments:  CN grossly intact, station and gait intact A&O x1  Psychiatric:        Mood and Affect: Mood normal.        Behavior: Behavior normal.        Thought Content: Thought content normal.        Judgment: Judgment normal.       Results for orders placed or performed in visit on 12/01/17  Microalbumin / creatinine urine ratio  Result Value Ref Range   Microalb, Ur 4.0 (H) 0.0 - 1.9 mg/dL   Creatinine,U 66.2 mg/dL   Microalb Creat Ratio 6.1 0.0 - 30.0 mg/g  CBC with Differential/Platelet  Result Value Ref Range   WBC 6.4 4.0 - 10.5 K/uL   RBC 4.27 3.87 - 5.11 Mil/uL   Hemoglobin 13.6 12.0 - 15.0 g/dL   HCT 40.1 36.0 - 46.0 %   MCV 93.8 78.0 - 100.0 fl   MCHC 33.8 30.0 -  36.0 g/dL   RDW 12.8 11.5 - 15.5 %   Platelets 149.0 (L) 150.0 - 400.0 K/uL   Neutrophils Relative % 53.0 43.0 - 77.0 %   Lymphocytes Relative 37.1 12.0 - 46.0 %   Monocytes Relative 6.6 3.0 - 12.0 %   Eosinophils Relative 2.8 0.0 - 5.0 %   Basophils Relative 0.5 0.0 - 3.0 %   Neutro Abs 3.4 1.4 - 7.7 K/uL   Lymphs Abs 2.4 0.7 - 4.0 K/uL   Monocytes Absolute 0.4 0.1 - 1.0 K/uL   Eosinophils Absolute 0.2 0.0 - 0.7 K/uL   Basophils Absolute 0.0 0.0 - 0.1 K/uL  Renal function panel  Result Value Ref Range   Sodium 143 135 - 145 mEq/L   Potassium 4.2 3.5 - 5.1 mEq/L   Chloride 107 96 - 112 mEq/L   CO2 29 19 - 32 mEq/L   Calcium 9.7 8.4 - 10.5 mg/dL   Albumin 3.9 3.5 - 5.2 g/dL   BUN 27 (H) 6 - 23 mg/dL   Creatinine, Ser 1.25 (H) 0.40 - 1.20 mg/dL   Glucose, Bld 96 70 - 99 mg/dL   Phosphorus 3.5 2.3 - 4.6 mg/dL   GFR 42.33 (L) >60.00 mL/min  Lipid panel  Result Value Ref Range   Cholesterol 148 0 - 200 mg/dL   Triglycerides 60.0 0.0 - 149.0 mg/dL   HDL 65.00 >39.00 mg/dL   VLDL 12.0 0.0 - 40.0 mg/dL   LDL Cholesterol 71 0 - 99 mg/dL   Total CHOL/HDL Ratio 2    NonHDL 83.06   TSH  Result  Value Ref Range   TSH 3.41 0.35 - 4.50 uIU/mL  VITAMIN D 25 Hydroxy (Vit-D Deficiency, Fractures)  Result Value Ref Range   VITD 80.61 30.00 - 100.00 ng/mL  Vitamin B12  Result Value Ref Range   Vitamin B-12 527 211 - 911 pg/mL  Hemoglobin A1c  Result Value Ref Range   Hgb A1c MFr Bld 5.3 4.6 - 6.5 %   Assessment & Plan:  This visit occurred during the SARS-CoV-2 public health emergency.  Safety protocols were in place, including screening questions prior to the visit, additional usage of staff PPE, and extensive cleaning of exam room while observing appropriate contact time as indicated for disinfecting solutions.   Problem List Items Addressed This Visit    Vitamin D deficiency    Continue supplementation.       Relevant Orders   vit d   Skin tear of left upper arm without  complication    Continue healing well using petroleum gauze.       Senile dementia ([place])    Stable period. Lives with daughter Dot and SIL, but Bonnita Nasuti other daughter helps significantly.       Medicare annual wellness visit, subsequent - Primary    I have personally reviewed the Medicare Annual Wellness questionnaire and have noted 1. The patient's medical and social history 2. Their use of alcohol, tobacco or illicit drugs 3. Their current medications and supplements 4. The patient's functional ability including ADL's, fall risks, home safety risks and hearing or visual impairment. Cognitive function has been assessed and addressed as indicated.  5. Diet and physical activity 6. Evidence for depression or mood disorders The patients weight, height, BMI have been recorded in the chart. I have made referrals, counseling and provided education to the patient based on review of the above and I have provided the pt with a written personalized care plan for preventive services. Provider list updated.. See scanned questionairre as needed for further documentation. Reviewed preventative protocols and updated unless pt declined.       Leg wound, right, subsequent encounter    Appreciate wound clinic care. They are changing dressings Q2 days with calcium alginate. Wound was not evaluated today as dressing had just been changed.       Hypothyroidism    Chronic, stable. Update TSH.       Relevant Orders   TSH   T4, Free   History of pernicious anemia   Relevant Orders   CBC with Differential   Vitamin B12   Health maintenance examination    Preventative protocols reviewed and updated unless pt declined. Discussed healthy diet and lifestyle.       Essential hypertension    Chronic, stable on amlodipine. Continue current regimen.       CKD (chronic kidney disease), stage III    Chronic, stable. update labs.       Relevant Orders   Lipid panel   Comprehensive metabolic panel    CBC with Differential   Chronic diastolic CHF (congestive heart failure) (HCC)    Stable period on low dose lasix. Seems euvolemic.       Advanced care planning/counseling discussion    Advanced directives: would want HCPOA to be any of her children. Does not want prolonged life support, does not want feeding tube or breathing tube. Discussed advanced directive. Has not set up, packet provided today.           No orders of the defined types were placed in this  encounter.  Orders Placed This Encounter  Procedures  . Lipid panel  . Comprehensive metabolic panel  . TSH  . CBC with Differential  . T4, Free  . Vitamin B12  . vit d   Patient instructions: Labwork today. Continue dressing changes as directed by wound clinic.  Continue follow up with wound care clinic for leg wound Advanced directive packet provided today to look at with family.  Return as needed or in 1 year for next wellness visit   Follow up plan: Return in about 1 year (around 12/26/2019) for annual exam, prior fasting for blood work, medicare wellness visit.  Ria Bush, MD

## 2018-12-27 ENCOUNTER — Encounter: Payer: Self-pay | Admitting: Family Medicine

## 2018-12-27 LAB — COMPREHENSIVE METABOLIC PANEL
ALT: 7 U/L (ref 0–35)
AST: 11 U/L (ref 0–37)
Albumin: 4 g/dL (ref 3.5–5.2)
Alkaline Phosphatase: 60 U/L (ref 39–117)
BUN: 23 mg/dL (ref 6–23)
CO2: 26 mEq/L (ref 19–32)
Calcium: 9.7 mg/dL (ref 8.4–10.5)
Chloride: 106 mEq/L (ref 96–112)
Creatinine, Ser: 1.46 mg/dL — ABNORMAL HIGH (ref 0.40–1.20)
GFR: 33.22 mL/min — ABNORMAL LOW (ref >60.00)
Glucose, Bld: 98 mg/dL (ref 70–99)
Potassium: 4.5 mEq/L (ref 3.5–5.1)
Sodium: 142 mEq/L (ref 135–145)
Total Bilirubin: 0.5 mg/dL (ref 0.2–1.2)
Total Protein: 6.3 g/dL (ref 6.0–8.3)

## 2018-12-27 LAB — LIPID PANEL
Cholesterol: 148 mg/dL (ref 0–200)
HDL: 59.8 mg/dL (ref >39.00)
LDL Cholesterol: 76 mg/dL (ref 0–99)
NonHDL: 87.71
Total CHOL/HDL Ratio: 2
Triglycerides: 60 mg/dL (ref 0.0–149.0)
VLDL: 12 mg/dL (ref 0.0–40.0)

## 2018-12-27 LAB — CBC WITH DIFFERENTIAL/PLATELET
Basophils Absolute: 0.1 10*3/uL (ref 0.0–0.1)
Basophils Relative: 1.4 % (ref 0.0–3.0)
Eosinophils Absolute: 0.1 10*3/uL (ref 0.0–0.7)
Eosinophils Relative: 1.7 % (ref 0.0–5.0)
HCT: 39.1 % (ref 36.0–46.0)
Hemoglobin: 12.9 g/dL (ref 12.0–15.0)
Lymphocytes Relative: 28.6 % (ref 12.0–46.0)
Lymphs Abs: 2.5 10*3/uL (ref 0.7–4.0)
MCHC: 33.1 g/dL (ref 30.0–36.0)
MCV: 95.4 fl (ref 78.0–100.0)
Monocytes Absolute: 0.7 10*3/uL (ref 0.1–1.0)
Monocytes Relative: 8.3 % (ref 3.0–12.0)
Neutro Abs: 5.1 10*3/uL (ref 1.4–7.7)
Neutrophils Relative %: 60 % (ref 43.0–77.0)
Platelets: 182 10*3/uL (ref 150.0–400.0)
RBC: 4.09 Mil/uL (ref 3.87–5.11)
RDW: 13.3 % (ref 11.5–15.5)
WBC: 8.6 10*3/uL (ref 4.0–10.5)

## 2018-12-27 LAB — T4, FREE: Free T4: 0.87 ng/dL (ref 0.60–1.60)

## 2018-12-27 LAB — VITAMIN D 25 HYDROXY (VIT D DEFICIENCY, FRACTURES): VITD: 72.15 ng/mL (ref 30.00–100.00)

## 2018-12-27 LAB — VITAMIN B12: Vitamin B-12: 915 pg/mL — ABNORMAL HIGH (ref 211–911)

## 2018-12-27 LAB — TSH: TSH: 2.95 u[IU]/mL (ref 0.35–4.50)

## 2018-12-27 NOTE — Assessment & Plan Note (Signed)
Appreciate wound clinic care. They are changing dressings Q2 days with calcium alginate. Wound was not evaluated today as dressing had just been changed.

## 2018-12-27 NOTE — Assessment & Plan Note (Signed)
Continue healing well using petroleum gauze.

## 2018-12-27 NOTE — Assessment & Plan Note (Signed)
Advanced directives: would want HCPOA to be any of her children. Does not want prolonged life support, does not want feeding tube or breathing tube. Discussed advanced directive. Has not set up, packet provided today.

## 2018-12-27 NOTE — Assessment & Plan Note (Signed)
Chronic, stable. update labs.

## 2018-12-27 NOTE — Assessment & Plan Note (Signed)
Stable period. Lives with daughter Dot and SIL, but [name] other daughter helps significantly.

## 2018-12-27 NOTE — Assessment & Plan Note (Signed)
Preventative protocols reviewed and updated unless pt declined. Discussed healthy diet and lifestyle.  

## 2018-12-27 NOTE — Assessment & Plan Note (Signed)
Stable period on low dose lasix. Seems euvolemic.

## 2018-12-27 NOTE — Assessment & Plan Note (Signed)
Chronic, stable. Update TSH.

## 2018-12-27 NOTE — Assessment & Plan Note (Signed)
Continue supplementation  ?

## 2018-12-27 NOTE — Assessment & Plan Note (Signed)
Chronic, stable on amlodipine. Continue current regimen.

## 2018-12-27 NOTE — Assessment & Plan Note (Signed)

## 2018-12-29 ENCOUNTER — Encounter: Payer: Medicare Other | Attending: Physician Assistant | Admitting: Physician Assistant

## 2018-12-29 ENCOUNTER — Other Ambulatory Visit: Payer: Self-pay | Admitting: Family Medicine

## 2018-12-29 ENCOUNTER — Other Ambulatory Visit: Payer: Self-pay

## 2018-12-29 DIAGNOSIS — N183 Chronic kidney disease, stage 3 unspecified: Secondary | ICD-10-CM | POA: Diagnosis not present

## 2018-12-29 DIAGNOSIS — W19XXXA Unspecified fall, initial encounter: Secondary | ICD-10-CM | POA: Diagnosis not present

## 2018-12-29 DIAGNOSIS — F0390 Unspecified dementia without behavioral disturbance: Secondary | ICD-10-CM | POA: Diagnosis not present

## 2018-12-29 DIAGNOSIS — I5032 Chronic diastolic (congestive) heart failure: Secondary | ICD-10-CM | POA: Diagnosis not present

## 2018-12-29 DIAGNOSIS — Z8249 Family history of ischemic heart disease and other diseases of the circulatory system: Secondary | ICD-10-CM | POA: Insufficient documentation

## 2018-12-29 DIAGNOSIS — I130 Hypertensive heart and chronic kidney disease with heart failure and stage 1 through stage 4 chronic kidney disease, or unspecified chronic kidney disease: Secondary | ICD-10-CM | POA: Diagnosis not present

## 2018-12-29 DIAGNOSIS — L97212 Non-pressure chronic ulcer of right calf with fat layer exposed: Secondary | ICD-10-CM | POA: Diagnosis not present

## 2018-12-29 DIAGNOSIS — S81801A Unspecified open wound, right lower leg, initial encounter: Secondary | ICD-10-CM | POA: Insufficient documentation

## 2018-12-29 DIAGNOSIS — S51802A Unspecified open wound of left forearm, initial encounter: Secondary | ICD-10-CM | POA: Diagnosis not present

## 2018-12-29 DIAGNOSIS — S51812A Laceration without foreign body of left forearm, initial encounter: Secondary | ICD-10-CM | POA: Diagnosis not present

## 2018-12-29 MED ORDER — VITAMIN B 12 500 MCG PO TABS: 500.0000 ug | ORAL_TABLET | Freq: Every day | ORAL | Status: AC

## 2018-12-29 NOTE — Progress Notes (Addendum)
IFRA, URIVE ([id number]) Visit Report for 12/29/2018 Chief Complaint Document Details Patient Name: [name] Date of Service: 12/29/2018 10:45 AM Medical Record Number: [id number] Patient Account Number: [id number] Date of Birth/Sex: 04-02-1920 (85 y.o. F) Treating RN: Montey Hora Primary Care Provider: Ria Bush Other Clinician: Referring Provider: Ria Bush Treating Provider/Extender: Melburn Hake, HOYT Weeks in Treatment: 2 Information Obtained from: Patient Chief Complaint Left arm, left forearm, and right LE skin tears Electronic Signature(s) Signed: 12/29/2018 10:38:58 AM By: Worthy Keeler PA-C Entered By: Worthy Keeler on 12/29/2018 10:38:58 [name], [name] ([id number]) -------------------------------------------------------------------------------- Debridement Details Patient Name: [name] Date of Service: 12/29/2018 10:45 AM Medical Record Number: [id number] Patient Account Number: [id number] Date of Birth/Sex: Apr 20, 1920 (85 y.o. F) Treating RN: Montey Hora Primary Care Provider: Ria Bush Other Clinician: Referring Provider: Ria Bush Treating Provider/Extender: Melburn Hake, HOYT Weeks in Treatment: 2 Debridement Performed for Wound #3 Right,Posterior Lower Leg Assessment: Performed By: Physician STONE III, HOYT E., PA-C Debridement Type: Debridement Level of Consciousness (Pre- Awake and Alert procedure): Pre-procedure Verification/Time Yes - 11:19 Out Taken: Start Time: 11:19 Pain Control: Lidocaine 4% Topical Solution Total Area Debrided (L x W): 3 (cm) x 5 (cm) = 15 (cm) Tissue and other material Viable, Non-Viable, Eschar, Slough, Subcutaneous, Slough, Other: dressing material debrided: Level: Skin/Subcutaneous Tissue Debridement Description: Excisional Instrument: Curette Bleeding: Minimum Hemostasis Achieved: Pressure End Time: 11:22 Procedural Pain: 0 Post Procedural Pain: 0 Response to  Treatment: Procedure was tolerated well Level of Consciousness Awake and Alert (Post-procedure): Post Debridement Measurements of Total Wound Length: (cm) 11 Width: (cm) 5 Depth: (cm) 0.2 Volume: (cm) 8.639 Character of Wound/Ulcer Post Debridement: Improved Post Procedure Diagnosis Same as Pre-procedure Electronic Signature(s) Signed: 12/29/2018 1:10:18 PM By: Worthy Keeler PA-C Signed: 12/29/2018 5:08:51 PM By: Montey Hora Entered By: Worthy Keeler on 12/29/2018 13:10:17 [name], [name] ([id number]) -------------------------------------------------------------------------------- HPI Details Patient Name: [name] Date of Service: 12/29/2018 10:45 AM Medical Record Number: [id number] Patient Account Number: [id number] Date of Birth/Sex: [birth year] (85 y.o. F) Treating RN: Montey Hora Primary Care Provider: Ria Bush Other Clinician: Referring Provider: Ria Bush Treating Provider/Extender: Melburn Hake, HOYT Weeks in Treatment: 2 History of Present Illness HPI Description: 12/15/2018 on evaluation today patient presents for initial evaluation here in our clinic concerning issues that she has been having at multiple locations with skin tears. She had a fall roughly 2 weeks ago where she injured her left upper arm and forearm at that time. Subsequently she ended up as well with a skin tear to her leg when her family members were trying to get her into the bed her skin is so fragile that as she was lifting her legs the skin on the back of the leg tore. That is on the right. Nonetheless they have been trying to manage these I did see her primary care provider earlier in the week who referred her to [name] for further evaluation and treatment. Currently there does not appear to be any signs of active infection at this time which is good news at any site. No fevers, chills, nausea, vomiting, or diarrhea. She does have pain but this is mainly on the leg ulcer  the arms are really not to painful at this point. The patient does have a history of dementia, hypertension, congestive heart failure, and chronic kidney disease stage III. 12/29/2018 on evaluation today patient appears to be doing better in regard to her skin tears on the forearm both are healing  quite nicely one is close the other is slightly hyper granulated which is probably preventing complete epithelization. With regard to her right lower extremity unfortunately she is still having a lot of dressing material/alginate which is stuck to the wound bed. This seems to be a result of unfortunately having a different type of alginate than what we actually ordered for her. We had ordered silver cell as this was more nonstick unfortunately she received a different alginate which is breaking down and getting somewhat matted into the wound. In fact I would have to try to get some of this off today if at all possible. Other than that she seems to actually be healing nicely she has much less drainage than previously noted. Electronic Signature(s) Signed: 12/29/2018 1:08:22 PM By: Worthy Keeler PA-C Entered By: Worthy Keeler on 12/29/2018 13:08:22 [name] ([id number]) -------------------------------------------------------------------------------- [name] TISS Details Patient Name: [name] Date of Service: 12/29/2018 10:45 AM Medical Record Number: [id number] Patient Account Number: [id number] Date of Birth/Sex: [birth year] (85 y.o. F) Treating RN: Montey Hora Primary Care Provider: Ria Bush Other Clinician: Referring Provider: Ria Bush Treating Provider/Extender: Melburn Hake, HOYT Weeks in Treatment: 2 Procedure Performed for: Wound #2 Left,Lateral Forearm Performed By: Physician STONE III, HOYT E., PA-C Post Procedure Diagnosis Same as Pre-procedure Notes 1 stick of silver nitrate used Electronic Signature(s) Signed: 12/29/2018 5:08:51 PM By:  Montey Hora Entered By: Montey Hora on 12/29/2018 11:15:58 [name], [name] ([id number]) -------------------------------------------------------------------------------- Physical Exam Details Patient Name: [name] Date of Service: 12/29/2018 10:45 AM Medical Record Number: [id number] Patient Account Number: [id number] Date of Birth/Sex: [birth year] (85 y.o. F) Treating RN: Montey Hora Primary Care Provider: Ria Bush Other Clinician: Referring Provider: Ria Bush Treating Provider/Extender: STONE III, HOYT Weeks in Treatment: 2 Constitutional Well-nourished and well-hydrated in no acute distress. Respiratory normal breathing without difficulty. Psychiatric Patient is not able to cooperate in decision making regarding care. Patient has dementia. pleasant and cooperative. Notes Patient's wound bed currently again on the arm appear to be doing well and completely healed at one site the second site was slightly hyper granular I did perform chemical cauterization with silver nitrate today which she tolerated without complication. With regard to the leg ulcer unfortunately she had a significant amount of alginate dressing stuck to the base of the wound. I did attempt debridement to clean some of the dressing material away as well as eschar and some slough and dead skin. I was able to remove some of this but unfortunately could not debride everything away. The dressing material in particular was extremely stuck to the wound bed. Subsequently I did abort the procedure without debriding the entire region and were going to actually switch the dressing to something else, Xeroform, to try to help loosen up some of the dressing material and keep things from sticking to this wound bed. Electronic Signature(s) Signed: 12/29/2018 1:09:40 PM By: Worthy Keeler PA-C Entered By: Worthy Keeler on 12/29/2018 13:09:40 [name], [name]  ([id number]) -------------------------------------------------------------------------------- Physician Orders Details Patient Name: [name] Date of Service: 12/29/2018 10:45 AM Medical Record Number: [id number] Patient Account Number: [id number] Date of Birth/Sex: 1920/10/27 (85 y.o. F) Treating RN: Montey Hora Primary Care Provider: Ria Bush Other Clinician: Referring Provider: Ria Bush Treating Provider/Extender: Melburn Hake, HOYT Weeks in Treatment: 2 Verbal / Phone Orders: No Diagnosis Coding ICD-10 Coding Code Description S41.102A Unspecified open wound of left upper arm, initial encounter S51.802A Unspecified open wound of left forearm, initial  encounter S81.801A Unspecified open wound, right lower leg, initial encounter F03.90 Unspecified dementia without behavioral disturbance I10 Essential (primary) hypertension 99991111 Chronic diastolic (congestive) heart failure N18.30 Chronic kidney disease, stage 3 unspecified Wound Cleansing Wound #2 Left,Lateral Forearm o Clean wound with Normal Saline. o Cleanse wound with mild soap and water o May Shower, gently pat wound dry prior to applying new dressing. Wound #3 Right,Posterior Lower Leg o Clean wound with Normal Saline. o Cleanse wound with mild soap and water o May Shower, gently pat wound dry prior to applying new dressing. Primary Wound Dressing Wound #2 Left,Lateral Forearm o Non-adherent pad Wound #3 Right,Posterior Lower Leg o Xeroform Secondary Dressing Wound #2 Left,Lateral Forearm o Other - netting Wound #3 Right,Posterior Lower Leg o ABD and Kerlix/Conform - secure with TubiGrip F Dressing Change Frequency Wound #2 Left,Lateral Forearm o Change dressing every other day. Wound #3 Right,Posterior Lower Leg o Change dressing every other day. AMAIRAH, KOELLNER ([id number]) Follow-up Appointments o Return Appointment in 1 week. Home Health Wound #2  Left,Lateral Forearm o [place] Cook may visit PRN to address patientos wound care needs. o FACE TO FACE ENCOUNTER: MEDICARE and MEDICAID PATIENTS: I certify that this patient is under my care and that I had a face-to-face encounter that meets the physician face-to-face encounter requirements with this patient on this date. The encounter with the patient was in whole or in part for the following MEDICAL CONDITION: (primary reason for [place]) MEDICAL NECESSITY: I certify, that based on my findings, NURSING services are a medically necessary home health service. HOME BOUND STATUS: I certify that my clinical findings support that this patient is homebound (i.e., Due to illness or injury, pt requires aid of supportive devices such as crutches, cane, wheelchairs, walkers, the use of special transportation or the assistance of another person to leave their place of residence. There is a normal inability to leave the home and doing so requires considerable and taxing effort. Other absences are for medical reasons / religious services and are infrequent or of short duration when for other reasons). o If current dressing causes regression in wound condition, may D/C ordered dressing product/s and apply Normal Saline Moist Dressing daily until next [place] / Other MD appointment. [place] of regression in wound condition at [phone]. o Please direct any NON-WOUND related issues/requests for orders to patient's Primary Care Physician Wound #3 [place] Cook may visit PRN to address patientos wound care needs. o FACE TO FACE ENCOUNTER: MEDICARE and MEDICAID PATIENTS: I certify that this patient is under my care and that I had a face-to-face encounter that meets the physician face-to-face encounter requirements with this patient on this date. The  encounter with the patient was in whole or in part for the following MEDICAL CONDITION: (primary reason for [place]) MEDICAL NECESSITY: I certify, that based on my findings, NURSING services are a medically necessary home health service. HOME BOUND STATUS: I certify that my clinical findings support that this patient is homebound (i.e., Due to illness or injury, pt requires aid of supportive devices such as crutches, cane, wheelchairs, walkers, the use of special transportation or the assistance of another person to leave their place of residence. There is a normal inability to leave the home and doing so requires considerable and taxing effort. Other absences are for medical reasons / religious services and are infrequent or of short  duration when for other reasons). o If current dressing causes regression in wound condition, may D/C ordered dressing product/s and apply Normal Saline Moist Dressing daily until next [place] / Other MD appointment. [place] of regression in wound condition at [phone]. o Please direct any NON-WOUND related issues/requests for orders to patient's Primary Care Physician Electronic Signature(s) Signed: 12/29/2018 5:08:51 PM By: Montey Hora Signed: 12/30/2018 11:37:39 PM By: Worthy Keeler PA-C Entered By: Montey Hora on 12/29/2018 11:27:34 [name], [name] ([id number]) -------------------------------------------------------------------------------- Problem List Details Patient Name: [name] Date of Service: 12/29/2018 10:45 AM Medical Record Number: [id number] Patient Account Number: [id number] Date of Birth/Sex: 08-18-1920 (85 y.o. F) Treating RN: Montey Hora Primary Care Provider: Ria Bush Other Clinician: Referring Provider: Ria Bush Treating Provider/Extender: Melburn Hake, HOYT Weeks in Treatment: 2 Active Problems ICD-10 Evaluated Encounter Code Description Active  Date Today Diagnosis S41.102A Unspecified open wound of left upper arm, initial encounter 12/15/2018 No Yes S51.802A Unspecified open wound of left forearm, initial encounter 12/15/2018 No Yes S81.801A Unspecified open wound, right lower leg, initial encounter 12/15/2018 No Yes F03.90 Unspecified dementia without behavioral disturbance 12/15/2018 No Yes I10 Essential (primary) hypertension 12/15/2018 No Yes [id number] Chronic diastolic (congestive) heart failure 12/15/2018 No Yes N18.30 Chronic kidney disease, stage 3 unspecified 12/15/2018 No Yes Inactive Problems Resolved Problems Electronic Signature(s) Signed: 12/29/2018 10:38:53 AM By: Worthy Keeler PA-C Entered By: Worthy Keeler on 12/29/2018 10:38:52 [name], [name] ([id number]) -------------------------------------------------------------------------------- Progress Note Details Patient Name: [name] Date of Service: 12/29/2018 10:45 AM Medical Record Number: [id number] Patient Account Number: [id number] Date of Birth/Sex: 09/18/1920 (85 y.o. F) Treating RN: Montey Hora Primary Care Provider: Ria Bush Other Clinician: Referring Provider: Ria Bush Treating Provider/Extender: Melburn Hake, HOYT Weeks in Treatment: 2 Subjective Chief Complaint Information obtained from Patient Left arm, left forearm, and right LE skin tears History of Present Illness (HPI) 12/15/2018 on evaluation today patient presents for initial evaluation here in our clinic concerning issues that she has been having at multiple locations with skin tears. She had a fall roughly 2 weeks ago where she injured her left upper arm and forearm at that time. Subsequently she ended up as well with a skin tear to her leg when her family members were trying to get her into the bed her skin is so fragile that as she was lifting her legs the skin on the back of the leg tore. That is on the right. Nonetheless they have been trying to manage  these I did see her primary care provider earlier in the week who referred her to [name] for further evaluation and treatment. Currently there does not appear to be any signs of active infection at this time which is good news at any site. No fevers, chills, nausea, vomiting, or diarrhea. She does have pain but this is mainly on the leg ulcer the arms are really not to painful at this point. The patient does have a history of dementia, hypertension, congestive heart failure, and chronic kidney disease stage III. 12/29/2018 on evaluation today patient appears to be doing better in regard to her skin tears on the forearm both are healing quite nicely one is close the other is slightly hyper granulated which is probably preventing complete epithelization. With regard to her right lower extremity unfortunately she is still having a lot of dressing material/alginate which is stuck to the wound bed. This seems to be a result of unfortunately having a different type  of alginate than what we actually ordered for her. We had ordered silver cell as this was more nonstick unfortunately she received a different alginate which is breaking down and getting somewhat matted into the wound. In fact I would have to try to get some of this off today if at all possible. Other than that she seems to actually be healing nicely she has much less drainage than previously noted. Patient History Information obtained from Patient. Family History Diabetes - Child, Hypertension - Child, Lung Disease - Child, No family history of Cancer, Heart Disease, Hereditary Spherocytosis, Kidney Disease, Seizures, Stroke, Thyroid Problems, Tuberculosis. Social History Never smoker, Marital Status - Widowed, Alcohol Use - Never, Drug Use - No History, Caffeine Use - Rarely. Medical History Eyes Denies history of Cataracts, Glaucoma Ear/Nose/Mouth/Throat Denies history of Chronic sinus problems/congestion, Middle ear  problems Hematologic/Lymphatic Denies history of Anemia, Hemophilia, Human Immunodeficiency Virus, Lymphedema, Sickle Cell Disease Respiratory Denies history of Aspiration, Asthma, Chronic Obstructive Pulmonary Disease (COPD), Pneumothorax, Sleep Apnea, Tuberculosis Cardiovascular [place], Patrica W. ([id number]) Denies history of Angina, Arrhythmia, Congestive Heart Failure, Coronary Artery Disease, Deep Vein Thrombosis, Hypertension, Hypotension, Myocardial Infarction, Peripheral Arterial Disease, Peripheral Venous Disease, Phlebitis, Vasculitis Gastrointestinal Denies history of Cirrhosis , Colitis, Crohn s, Hepatitis A, Hepatitis B, Hepatitis C Endocrine Denies history of Type I Diabetes, Type II Diabetes Genitourinary Denies history of End Stage Renal Disease Immunological Denies history of Lupus Erythematosus, Raynaud s, Scleroderma Integumentary (Skin) Denies history of History of Burn, History of pressure wounds Musculoskeletal Denies history of Gout, Rheumatoid Arthritis, Osteoarthritis, Osteomyelitis Neurologic Denies history of Dementia, Neuropathy, Quadriplegia, Paraplegia, Seizure Disorder Oncologic Denies history of Received Chemotherapy, Received Radiation Psychiatric Denies history of Anorexia/bulimia, Confinement Anxiety Review of Systems (ROS) Constitutional Symptoms (General Health) Denies complaints or symptoms of Fatigue, Fever, Chills, Marked Weight Change. Respiratory Denies complaints or symptoms of Chronic or frequent coughs, Shortness of Breath. Cardiovascular Complains or has symptoms of LE edema. Denies complaints or symptoms of Chest pain. Psychiatric Denies complaints or symptoms of Anxiety, Claustrophobia. Objective Constitutional Well-nourished and well-hydrated in no acute distress. Vitals Time Taken: 10:48 AM, Height: 63 in, Weight: 113 lbs, BMI: 20, Temperature: 97.9 F, Pulse: 83 bpm, Respiratory Rate: 16 breaths/min, Blood Pressure: 147/58  mmHg. Respiratory normal breathing without difficulty. Psychiatric Patient is not able to cooperate in decision making regarding care. Patient has dementia. pleasant and cooperative. General Notes: Patient's wound bed currently again on the arm appear to be doing well and completely healed at one site the second site was slightly hyper granular I did perform chemical cauterization with silver nitrate today which she tolerated Gielow, Atoya W. ([id number]) without complication. With regard to the leg ulcer unfortunately she had a significant amount of alginate dressing stuck to the base of the wound. I did attempt debridement to clean some of the dressing material away as well as eschar and some slough and dead skin. I was able to remove some of this but unfortunately could not debride everything away. The dressing material in particular was extremely stuck to the wound bed. Subsequently I did abort the procedure without debriding the entire region and were going to actually switch the dressing to something else, Xeroform, to try to help loosen up some of the dressing material and keep things from sticking to this wound bed. Integumentary (Hair, Skin) Wound #1 status is Healed - Epithelialized. Original cause of wound was Trauma. The wound is located on the Left Upper Arm. The wound measures 0cm length x 0cm  width x 0cm depth; 0cm^2 area and 0cm^3 volume. There is no tunneling or undermining noted. There is a none present amount of drainage noted. The wound margin is flat and intact. There is small (1-33%) red granulation within the wound bed. There is a large (67-100%) amount of necrotic tissue within the wound bed including Adherent Slough. Wound #2 status is Open. Original cause of wound was Trauma. The wound is located on the Left,Lateral Forearm. The wound measures 0.5cm length x 0.3cm width x 0.1cm depth; 0.118cm^2 area and 0.012cm^3 volume. There is Fat Layer (Subcutaneous Tissue) Exposed  exposed. There is no tunneling or undermining noted. There is a medium amount of sanguinous drainage noted. The wound margin is flat and intact. There is small (1-33%) red granulation within the wound bed. There is a medium (34-66%) amount of necrotic tissue within the wound bed including Adherent Slough. Wound #3 status is Open. Original cause of wound was Trauma. The wound is located on the Right,Posterior Lower Leg. The wound measures 11cm length x 5cm width x 0.1cm depth; 43.197cm^2 area and 4.32cm^3 volume. There is Fat Layer (Subcutaneous Tissue) Exposed exposed. There is no tunneling or undermining noted. There is a medium amount of sanguinous drainage noted. The wound margin is flat and intact. There is small (1-33%) red granulation within the wound bed. There is a large (67-100%) amount of necrotic tissue within the wound bed including Eschar and Adherent Slough. Assessment Active Problems ICD-10 Unspecified open wound of left upper arm, initial encounter Unspecified open wound of left forearm, initial encounter Unspecified open wound, right lower leg, initial encounter Unspecified dementia without behavioral disturbance Essential (primary) hypertension Chronic diastolic (congestive) heart failure Chronic kidney disease, stage 3 unspecified Procedures Wound #3 Pre-procedure diagnosis of Wound #3 is a Skin Tear located on the Right,Posterior Lower Leg . There was a Excisional Skin/Subcutaneous Tissue Debridement with a total area of 15 sq cm performed by STONE III, HOYT E., PA-C. With the following instrument(s): Curette to remove Viable and Non-Viable tissue/material. Material removed includes Eschar, Subcutaneous Tissue, Slough, and Other: dressing material after achieving pain control using Lidocaine 4% Topical Solution. No specimens were taken. A time out was conducted at 11:19, prior to the start of the procedure. A Minimum amount of bleeding was controlled with Pressure. The  procedure was tolerated well with a pain level of 0 throughout and a pain level of 0 following the procedure. Post Debridement Measurements: 11cm length x 5cm width x 0.2cm depth; 8.639cm^3 volume. Character of Wound/Ulcer Post Debridement is improved. [name], FIALLOS ([id number]) Post procedure Diagnosis Wound #3: Same as Pre-Procedure Wound #2 Pre-procedure diagnosis of Wound #2 is a Skin Tear located on the Left,Lateral Forearm . An CHEM CAUT GRANULATION TISS procedure was performed by STONE III, HOYT E., PA-C. Post procedure Diagnosis Wound #2: Same as Pre-Procedure Notes: 1 stick of silver nitrate used Plan Wound Cleansing: Wound #2 Left,Lateral Forearm: Clean wound with Normal Saline. Cleanse wound with mild soap and water May Shower, gently pat wound dry prior to applying new dressing. Wound #3 Right,Posterior Lower Leg: Clean wound with Normal Saline. Cleanse wound with mild soap and water May Shower, gently pat wound dry prior to applying new dressing. Primary Wound Dressing: Wound #2 Left,Lateral Forearm: Non-adherent pad Wound #3 Right,Posterior Lower Leg: Xeroform Secondary Dressing: Wound #2 Left,Lateral Forearm: Other - netting Wound #3 Right,Posterior Lower Leg: ABD and Kerlix/Conform - secure with TubiGrip F Dressing Change Frequency: Wound #2 Left,Lateral Forearm: Change dressing every other day.  Wound #3 Right,Posterior Lower Leg: Change dressing every other day. Follow-up Appointments: Return Appointment in 1 week. Home Health: Wound #2 Left,Lateral Forearm: [name] may visit PRN to address patient s wound care needs. FACE TO FACE ENCOUNTER: MEDICARE and MEDICAID PATIENTS: I certify that this patient is under my care and that I had a face-to-face encounter that meets the physician face-to-face encounter requirements with this patient on this date. The encounter with the patient was in whole or in part for the  following MEDICAL CONDITION: (primary reason for [place]) MEDICAL NECESSITY: I certify, that based on my findings, NURSING services are a medically necessary home health service. HOME BOUND STATUS: I certify that my clinical findings support that this patient is homebound (i.e., Due to illness or injury, pt requires aid of supportive devices such as crutches, cane, wheelchairs, walkers, the use of special transportation or the assistance of another person to leave their place of residence. There is a normal inability to leave the home and doing so requires considerable and taxing effort. Other absences are for medical reasons / religious services and are infrequent or of short duration when for other reasons). If current dressing causes regression in wound condition, may D/C ordered dressing product/s and apply Normal Saline Moist Dressing daily until next [place] / Other MD appointment. [place] of regression in wound condition at [phone]. Please direct any NON-WOUND related issues/requests for orders to patient's Primary Care Physician Wound #3 Right,Posterior Lower Leg: [name], [name] ([id number]) [place] Cook may visit PRN to address patient s wound care needs. FACE TO FACE ENCOUNTER: MEDICARE and MEDICAID PATIENTS: I certify that this patient is under my care and that I had a face-to-face encounter that meets the physician face-to-face encounter requirements with this patient on this date. The encounter with the patient was in whole or in part for the following MEDICAL CONDITION: (primary reason for [place]) MEDICAL NECESSITY: I certify, that based on my findings, NURSING services are a medically necessary home health service. HOME BOUND STATUS: I certify that my clinical findings support that this patient is homebound (i.e., Due to illness or injury, pt requires aid of supportive devices such as  crutches, cane, wheelchairs, walkers, the use of special transportation or the assistance of another person to leave their place of residence. There is a normal inability to leave the home and doing so requires considerable and taxing effort. Other absences are for medical reasons / religious services and are infrequent or of short duration when for other reasons). If current dressing causes regression in wound condition, may D/C ordered dressing product/s and apply Normal Saline Moist Dressing daily until next [place] / Other MD appointment. [place] of regression in wound condition at [phone]. Please direct any NON-WOUND related issues/requests for orders to patient's Primary Care Physician 1. I would recommend currently that we go ahead and continue with the dressings to the left forearm although I did switch to issues and a nonstick [place] I think that skin to be best. I think if we were to actually use the Xeroform this will provide too much moisture which unfortunately is not going to accomplish what we are after at this point. 2. I would recommend as well that we actually switch to Xeroform gauze for the leg ulcer is not draining nearly as much anymore as what it was previous I think that we can  do this comfortably without having to worry about too much maceration and fortunately this should also prevent sticking and adhering to the wound. The patient's family members in agreement with that plan. We will also initiate treatment with Tubigrip to try to help with edema control as I think that will also be beneficial. 3. With regard to debridement unfortunately I was only able to debride a small area which was roughly 3 x 5 cm away today. I could not get the current alginate which was stuck to the wound off I am hoping the Xeroform will help loosen this up. We will see patient back for reevaluation in 1 week here in the clinic. If anything worsens or  changes patient will contact our office for additional recommendations. Electronic Signature(s) Signed: 12/29/2018 1:11:11 PM By: Worthy Keeler PA-C Entered By: Worthy Keeler on 12/29/2018 13:11:11 [name], [name] ([id number]) -------------------------------------------------------------------------------- ROS/PFSH Details Patient Name: [name] Date of Service: 12/29/2018 10:45 AM Medical Record Number: [id number] Patient Account Number: [id number] Date of Birth/Sex: 10-18-1920 (85 y.o. F) Treating RN: Montey Hora Primary Care Provider: Ria Bush Other Clinician: Referring Provider: Ria Bush Treating Provider/Extender: Melburn Hake, HOYT Weeks in Treatment: 2 Information Obtained From Patient Constitutional Symptoms (General Health) Complaints and Symptoms: Negative for: Fatigue; Fever; Chills; Marked Weight Change Respiratory Complaints and Symptoms: Negative for: Chronic or frequent coughs; Shortness of Breath Medical History: Negative for: Aspiration; Asthma; Chronic Obstructive Pulmonary Disease (COPD); Pneumothorax; Sleep Apnea; Tuberculosis Cardiovascular Complaints and Symptoms: Positive for: LE edema Negative for: Chest pain Medical History: Negative for: Angina; Arrhythmia; Congestive Heart Failure; Coronary Artery Disease; Deep Vein Thrombosis; Hypertension; Hypotension; Myocardial Infarction; Peripheral Arterial Disease; Peripheral Venous Disease; Phlebitis; Vasculitis Psychiatric Complaints and Symptoms: Negative for: Anxiety; Claustrophobia Medical History: Negative for: Anorexia/bulimia; Confinement Anxiety Eyes Medical History: Negative for: Cataracts; Glaucoma Ear/Nose/Mouth/Throat Medical History: Negative for: Chronic sinus problems/congestion; Middle ear problems Hematologic/Lymphatic Medical History: Negative for: Anemia; Hemophilia; Human Immunodeficiency Virus; Lymphedema; Sickle Cell Disease [place], Amoni W.  ([id number]) Gastrointestinal Medical History: Negative for: Cirrhosis ; Colitis; Crohnos; Hepatitis A; Hepatitis B; Hepatitis C Endocrine Medical History: Negative for: Type I Diabetes; Type II Diabetes Genitourinary Medical History: Negative for: End Stage Renal Disease Immunological Medical History: Negative for: Lupus Erythematosus; Raynaudos; Scleroderma Integumentary (Skin) Medical History: Negative for: History of Burn; History of pressure wounds Musculoskeletal Medical History: Negative for: Gout; Rheumatoid Arthritis; Osteoarthritis; Osteomyelitis Neurologic Medical History: Negative for: Dementia; Neuropathy; Quadriplegia; Paraplegia; Seizure Disorder Oncologic Medical History: Negative for: Received Chemotherapy; Received Radiation Immunizations Pneumococcal Vaccine: Received Pneumococcal Vaccination: Yes Implantable Devices None Family and Social History Cancer: No; Diabetes: Yes - Child; Heart Disease: No; Hereditary Spherocytosis: No; Hypertension: Yes - Child; Kidney Disease: No; Lung Disease: Yes - Child; Seizures: No; Stroke: No; Thyroid Problems: No; Tuberculosis: No; Never smoker; Marital Status - Widowed; Alcohol Use: Never; Drug Use: No History; Caffeine Use: Rarely; Financial Concerns: No; Food, Clothing or Shelter Needs: No; Support System Lacking: No; Transportation Concerns: No Physician Affirmation I have reviewed and agree with the above information. Electronic Signature(s) Signed: 12/29/2018 5:08:51 PM By: Traci Edwards ([id number]) Signed: 12/30/2018 11:37:39 PM By: Worthy Keeler PA-C Entered By: Worthy Keeler on 12/29/2018 13:08:46 [name], [name] ([id number]) -------------------------------------------------------------------------------- SuperBill Details Patient Name: [name] Date of Service: 12/29/2018 Medical Record Number: [id number] Patient Account Number: [id number] Date of Birth/Sex: [birth year] (85 y.o.  F) Treating RN: Montey Hora Primary Care Provider: Ria Bush Other Clinician: Referring Provider: Ria Bush Treating Provider/Extender:  STONE III, HOYT Weeks in Treatment: 2 Diagnosis Coding ICD-10 Codes Code Description [id number] Unspecified open wound of left upper arm, initial encounter S51.802A Unspecified open wound of left forearm, initial encounter S81.801A Unspecified open wound, right lower leg, initial encounter F03.90 Unspecified dementia without behavioral disturbance I10 Essential (primary) hypertension [id number] Chronic diastolic (congestive) heart failure N18.30 Chronic kidney disease, stage 3 unspecified Facility Procedures CPT4 Code: [id number] Description: [id number] - DEB SUBQ TISSUE 20 SQ CM/< ICD-10 Diagnosis Description S81.801A Unspecified open wound, right lower leg, initial encounter Modifier: Quantity: 1 CPT4 Code: [id number] Description: [id number] - CHEM CAUT GRANULATION TISS ICD-10 Diagnosis Description S51.802A Unspecified open wound of left forearm, initial encounter Modifier: Quantity: 1 Physician Procedures CPT4 [id number] Description: [id number] - WC PHYS SUBQ TISS 20 SQ CM ICD-10 Diagnosis Description S81.801A Unspecified open wound, right lower leg, initial encounter Modifier: Quantity: 1 CPT4 Code: [id number] Description: [id number] - WC PHYS CHEM CAUT GRAN TISSUE ICD-10 Diagnosis Description S51.802A Unspecified open wound of left forearm, initial encounter Modifier: Quantity: 1 Electronic Signature(s) Signed: 12/29/2018 1:11:31 PM By: Worthy Keeler PA-C Entered By: Worthy Keeler on 12/29/2018 13:11:30

## 2018-12-29 NOTE — Progress Notes (Addendum)
[name] ([id number]) Visit Report for 12/29/2018 Arrival Information Details Patient Name: [name], [name] Date of Service: 12/29/2018 10:45 AM Medical Record Number: [id number] Patient Account Number: [id number] Date of Birth/Sex: 1920/06/28 (85 y.o. F) Treating RN: Harold Barban Primary Care Jostin Rue: Ria Bush Other Clinician: Referring Katerra Ingman: Ria Bush Treating Myrtle Barnhard/Extender: Melburn Hake, HOYT Weeks in Treatment: 2 Visit Information History Since Last Visit Added or deleted any medications: No Patient Arrived: Walker Any new allergies or adverse reactions: No Arrival Time: 10:46 Had a fall or experienced change in No Accompanied By: son-in-law activities of daily living that may affect Transfer Assistance: Manual risk of falls: Patient Identification Verified: Yes Signs or symptoms of abuse/neglect since last visito No Secondary Verification Process Completed: Yes Hospitalized since last visit: No Has Dressing in Place as Prescribed: Yes Pain Present Now: No Electronic Signature(s) Signed: 12/29/2018 4:16:49 PM By: Harold Barban Entered By: Harold Barban on 12/29/2018 10:48:21 Mcgillivray, [name] ([id number]) -------------------------------------------------------------------------------- Encounter Discharge Information Details Patient Name: [name] Date of Service: 12/29/2018 10:45 AM Medical Record Number: [id number] Patient Account Number: [id number] Date of Birth/Sex: 1921-01-19 (85 y.o. F) Treating RN: Montey Hora Primary Care Marquist Binstock: Ria Bush Other Clinician: Referring Jeryl Wilbourn: Ria Bush Treating Nakea Gouger/Extender: Melburn Hake, HOYT Weeks in Treatment: 2 Encounter Discharge Information Items Post Procedure Vitals Discharge Condition: Stable Temperature (F): 97.9 Ambulatory Status: Walker Pulse (bpm): 83 Discharge Destination: Home Respiratory Rate (breaths/min): 16 Transportation: Private  Auto Blood Pressure (mmHg): 147/58 Accompanied By: son Schedule Follow-up Appointment: Yes Clinical Summary of Care: Electronic Signature(s) Signed: 12/29/2018 5:08:51 PM By: Montey Hora Entered By: Montey Hora on 12/29/2018 11:32:32 Goodspeed, [name] ([id number]) -------------------------------------------------------------------------------- Lower Extremity Assessment Details Patient Name: [name] Date of Service: 12/29/2018 10:45 AM Medical Record Number: [id number] Patient Account Number: [id number] Date of Birth/Sex: Sep 03, 1920 (85 y.o. F) Treating RN: Harold Barban Primary Care Jeramy Dimmick: Ria Bush Other Clinician: Referring Addalyne Vandehei: Ria Bush Treating Elkin Belfield/Extender: STONE III, HOYT Weeks in Treatment: 2 Edema Assessment Assessed: [Left: No] [Right: No] [Left: Edema] [Right: :] Calf Left: Right: Point of Measurement: 30 cm From Medial Instep cm 33 cm Ankle Left: Right: Point of Measurement: 10 cm From Medial Instep cm 21 cm Vascular Assessment Pulses: Dorsalis Pedis Palpable: [Right:Yes] Posterior Tibial Palpable: [Right:Yes] Electronic Signature(s) Signed: 12/29/2018 4:16:49 PM By: Harold Barban Entered By: Harold Barban on 12/29/2018 11:03:01 Thorman, [name] ([id number]) -------------------------------------------------------------------------------- Multi Wound Chart Details Patient Name: [name] Date of Service: 12/29/2018 10:45 AM Medical Record Number: [id number] Patient Account Number: [id number] Date of Birth/Sex: Jul 22, 1920 (85 y.o. F) Treating RN: Montey Hora Primary Care Sharmain Lastra: Ria Bush Other Clinician: Referring [name]: Ria Bush Treating Marysue Fait/Extender: STONE III, HOYT Weeks in Treatment: 2 Vital Signs Height(in): 63 Pulse(bpm): 83 Weight(lbs): 113 Blood Pressure(mmHg): 147/58 Body Mass Index(BMI): 20 Temperature(F): 97.9 Respiratory  Rate 16 (breaths/min): Photos: Wound Location: Left Upper Arm Left Forearm - Lateral Right Lower Leg - Posterior Wounding Event: Trauma Trauma Trauma Primary Etiology: Skin Tear Skin Tear Skin Tear Date Acquired: 12/09/2018 12/09/2018 12/09/2018 Weeks of Treatment: 2 2 2  Wound Status: Healed - Epithelialized Open Open Measurements L x W x D 0x0x0 0.5x0.3x0.1 11x5x0.1 (cm) Area (cm) : 0 0.118 43.197 Volume (cm) : 0 0.012 4.32 % Reduction in Area: 100.00% 39.80% 0.00% % Reduction in Volume: 100.00% 40.00% 0.00% Classification: Full Thickness Without Full Thickness Without Full Thickness Without Exposed Support Structures Exposed Support Structures Exposed Support Structures Exudate Amount: None Present Medium Medium Exudate Type: N/A Sanguinous Sanguinous Exudate Color:  N/A red red Wound Margin: Flat and Intact Flat and Intact Flat and Intact Granulation Amount: Small (1-33%) Small (1-33%) Small (1-33%) Granulation Quality: Red Red Red Necrotic Amount: Large (67-100%) Medium (34-66%) Large (67-100%) Necrotic Tissue: Adherent Stanley Exposed Structures: Fascia: No Fat Layer (Subcutaneous Fat Layer (Subcutaneous Fat Layer (Subcutaneous Tissue) Exposed: Yes Tissue) Exposed: Yes Tissue) Exposed: No Fascia: No Fascia: No Tendon: No Tendon: No Tendon: No Muscle: No Muscle: No Muscle: No Joint: No Joint: No Joint: No Bone: No Bone: No Bone: No Kindley, Kamariyah W. ([id number]) Epithelialization: Small (1-33%) None None Treatment Notes Electronic Signature(s) Signed: 12/29/2018 5:08:51 PM By: Montey Hora Entered By: Montey Hora on 12/29/2018 11:15:38 Michael, [name] ([id number]) -------------------------------------------------------------------------------- Multi-Disciplinary Care Plan Details Patient Name: [name] Date of Service: 12/29/2018 10:45 AM Medical Record Number: [id number] Patient Account Number:  [id number] Date of Birth/Sex: [birth year] (85 y.o. F) Treating RN: Montey Hora Primary Care Vallie Fayette: Ria Bush Other Clinician: Referring Ekam Bonebrake: Ria Bush Treating Lukasz Rogus/Extender: Melburn Hake, HOYT Weeks in Treatment: 2 Active Inactive Abuse / Safety / Falls / Self Care Management Nursing Diagnoses: Potential for falls Goals: Patient will not experience any injury related to falls Date Initiated: 12/15/2018 Target Resolution Date: 03/03/2019 Goal Status: Active Interventions: Assess fall risk on admission and as needed Notes: Orientation to the Wound Care Program Nursing Diagnoses: Knowledge deficit related to the wound healing center program Goals: Patient/caregiver will verbalize understanding of the [facility] Date Initiated: 12/15/2018 Target Resolution Date: 03/03/2019 Goal Status: Active Interventions: Provide education on orientation to the wound center Notes: Wound/Skin Impairment Nursing Diagnoses: Impaired tissue integrity Goals: Ulcer/skin breakdown will heal within 14 weeks Date Initiated: 12/15/2018 Target Resolution Date: 03/03/2019 Goal Status: Active Interventions: Assess patient/caregiver ability to obtain necessary supplies Teem, Valaria W. ([id number]) Assess patient/caregiver ability to perform ulcer/skin care regimen upon admission and as needed Assess ulceration(s) every visit Notes: Electronic Signature(s) Signed: 12/29/2018 5:08:51 PM By: Montey Hora Entered By: Montey Hora on 12/29/2018 11:15:25 Goonan, [name] ([id number]) -------------------------------------------------------------------------------- Pain Assessment Details Patient Name: [name] Date of Service: 12/29/2018 10:45 AM Medical Record Number: [id number] Patient Account Number: [id number] Date of Birth/Sex: 1920/10/26 (85 y.o. F) Treating RN: Harold Barban Primary Care Naoki Migliaccio: Ria Bush Other Clinician: Referring  Jeriko Kowalke: Ria Bush Treating Christo Hain/Extender: Melburn Hake, HOYT Weeks in Treatment: 2 Active Problems Location of Pain Severity and Description of Pain Patient Has Paino No Site Locations Pain Management and Medication Current Pain Management: Electronic Signature(s) Signed: 12/29/2018 4:16:49 PM By: Harold Barban Entered By: Harold Barban on 12/29/2018 10:48:27 Paff, [name] ([id number]) -------------------------------------------------------------------------------- Patient/Caregiver Education Details Patient Name: [name] Date of Service: 12/29/2018 10:45 AM Medical Record Number: [id number] Patient Account Number: [id number] Date of Birth/Gender: [birth year] (85 y.o. F) Treating RN: Montey Hora Primary Care Physician: Ria Bush Other Clinician: Referring Physician: Ria Bush Treating Physician/Extender: Sharalyn Ink in Treatment: 2 Education Assessment Education Provided To: Patient and Caregiver Education Topics Provided Wound/Skin Impairment: Handouts: Other: wound care as ordered Methods: Demonstration, Explain/Verbal Responses: State content correctly Electronic Signature(s) Signed: 12/29/2018 5:08:51 PM By: Montey Hora Entered By: Montey Hora on 12/29/2018 11:30:50 Sturdy, [name] ([id number]) -------------------------------------------------------------------------------- Wound Assessment Details Patient Name: [name] Date of Service: 12/29/2018 10:45 AM Medical Record Number: [id number] Patient Account Number: [id number] Date of Birth/Sex: 05-08-1920 (85 y.o. F) Treating RN: Montey Hora Primary Care Peyson Postema: Ria Bush Other Clinician: Referring Lotta Frankenfield: Ria Bush Treating Tersa Fotopoulos/Extender: STONE III, HOYT Weeks in  Treatment: 2 Wound Status Wound Number: 1 Primary Etiology: Skin Tear Wound Location: Left Upper Arm Wound Status: Healed - Epithelialized Wounding Event:  Trauma Date Acquired: 12/09/2018 Weeks Of Treatment: 2 Clustered Wound: No Photos Wound Measurements Length: (cm) 0 % Reducti Width: (cm) 0 % Reducti Depth: (cm) 0 Epithelia Area: (cm) 0 Tunnelin Volume: (cm) 0 Undermin on in Area: 100% on in Volume: 100% lization: Small (1-33%) g: No ing: No Wound Description Full Thickness Without Exposed Support Foul Odo Classification: Structures Slough/F Wound Margin: Flat and Intact Exudate None Present Amount: r After Cleansing: No ibrino Yes Wound Bed Granulation Amount: Small (1-33%) Exposed Structure Granulation Quality: Red Fascia Exposed: No Necrotic Amount: Large (67-100%) Fat Layer (Subcutaneous Tissue) Exposed: No Necrotic Quality: Adherent Slough Tendon Exposed: No Muscle Exposed: No Joint Exposed: No Bone Exposed: No Electronic Signature(s) Signed: 12/29/2018 5:08:51 PM By: Waynard Edwards ([id number]) Entered By: Montey Hora on 12/29/2018 11:13:48 Criss, [name] ([id number]) -------------------------------------------------------------------------------- Wound Assessment Details Patient Name: [name] Date of Service: 12/29/2018 10:45 AM Medical Record Number: [id number] Patient Account Number: [id number] Date of Birth/Sex: May 17, 1920 (85 y.o. F) Treating RN: Harold Barban Primary Care Eriel Doyon: Ria Bush Other Clinician: Referring Roston Grunewald: Ria Bush Treating Missey Hasley/Extender: Melburn Hake, HOYT Weeks in Treatment: 2 Wound Status Wound Number: 2 Primary Etiology: Skin Tear Wound Location: Left Forearm - Lateral Wound Status: Open Wounding Event: Trauma Date Acquired: 12/09/2018 Weeks Of Treatment: 2 Clustered Wound: No Photos Wound Measurements Length: (cm) 0.5 % Reduct Width: (cm) 0.3 % Reduct Depth: (cm) 0.1 Epitheli Area: (cm) 0.118 Tunneli Volume: (cm) 0.012 Undermi ion in Area: 39.8% ion in Volume: 40% alization: None ng: No ning: No Wound  Description Full Thickness Without Exposed Support Foul Od Classification: Structures Slough/ Wound Margin: Flat and Intact Exudate Medium Amount: Exudate Type: Sanguinous Exudate Color: red or After Cleansing: No Fibrino Yes Wound Bed Granulation Amount: Small (1-33%) Exposed Structure Granulation Quality: Red Fascia Exposed: No Necrotic Amount: Medium (34-66%) Fat Layer (Subcutaneous Tissue) Exposed: Yes Necrotic Quality: Adherent Slough Tendon Exposed: No Muscle Exposed: No Joint Exposed: No Bone Exposed: No Denzer, Bethanne W. ([id number]) Treatment Notes Wound #2 (Left, Lateral Forearm) Notes arm - non adherent pad and netting; leg - xeroform, abd, conform and tubigrip F Electronic Signature(s) Signed: 12/29/2018 4:16:49 PM By: Harold Barban Entered By: Harold Barban on 12/29/2018 11:02:02 Goar, [name] ([id number]) -------------------------------------------------------------------------------- Wound Assessment Details Patient Name: [name] Date of Service: 12/29/2018 10:45 AM Medical Record Number: [id number] Patient Account Number: [id number] Date of Birth/Sex: 1920-04-17 (85 y.o. F) Treating RN: Harold Barban Primary Care Mashonda Broski: Ria Bush Other Clinician: Referring Kadyn Chovan: Ria Bush Treating Teffany Blaszczyk/Extender: STONE III, HOYT Weeks in Treatment: 2 Wound Status Wound Number: 3 Primary Etiology: Skin Tear Wound Location: Right Lower Leg - Posterior Wound Status: Open Wounding Event: Trauma Date Acquired: 12/09/2018 Weeks Of Treatment: 2 Clustered Wound: No Photos Wound Measurements Length: (cm) 11 Width: (cm) 5 Depth: (cm) 0.1 Area: (cm) 43.197 Volume: (cm) 4.32 % Reduction in Area: 0% % Reduction in Volume: 0% Epithelialization: None Tunneling: No Undermining: No Wound Description Full Thickness Without Exposed Support Foul Od Classification: Structures Slough/ Wound Margin: Flat and  Intact Exudate Medium Amount: Exudate Type: Sanguinous Exudate Color: red or After Cleansing: No Fibrino Yes Wound Bed Granulation Amount: Small (1-33%) Exposed Structure Granulation Quality: Red Fascia Exposed: No Necrotic Amount: Large (67-100%) Fat Layer (Subcutaneous Tissue) Exposed: Yes Necrotic Quality: Eschar, Adherent Slough Tendon Exposed: No Muscle Exposed: No Joint Exposed:  No Bone Exposed: No Soules, Shabrea W. ([id number]) Treatment Notes Wound #3 (Right, Posterior Lower Leg) Notes arm - non adherent pad and netting; leg - xeroform, abd, conform and tubigrip F Electronic Signature(s) Signed: 12/29/2018 4:16:49 PM By: Harold Barban Entered By: Harold Barban on 12/29/2018 11:02:39 Chancellor, [name] ([id number]) -------------------------------------------------------------------------------- Vitals Details Patient Name: [name] Date of Service: 12/29/2018 10:45 AM Medical Record Number: [id number] Patient Account Number: [id number] Date of Birth/Sex: [birth year] (85 y.o. F) Treating RN: Harold Barban Primary Care Charice Zuno: Ria Bush Other Clinician: Referring Aneyah Lortz: Ria Bush Treating Tymothy Cass/Extender: Melburn Hake, HOYT Weeks in Treatment: 2 Vital Signs Time Taken: 10:48 Temperature (F): 97.9 Height (in): 63 Pulse (bpm): 83 Weight (lbs): 113 Respiratory Rate (breaths/min): 16 Body Mass Index (BMI): 20 Blood Pressure (mmHg): 147/58 Reference Range: 80 - 120 mg / dl Electronic Signature(s) Signed: 12/29/2018 4:16:49 PM By: Harold Barban Entered By: Harold Barban on 12/29/2018 10:51:56

## 2019-01-02 ENCOUNTER — Encounter: Payer: Medicare Other | Admitting: Physician Assistant

## 2019-01-02 ENCOUNTER — Other Ambulatory Visit: Payer: Self-pay

## 2019-01-02 DIAGNOSIS — L98492 Non-pressure chronic ulcer of skin of other sites with fat layer exposed: Secondary | ICD-10-CM | POA: Diagnosis not present

## 2019-01-02 DIAGNOSIS — I5032 Chronic diastolic (congestive) heart failure: Secondary | ICD-10-CM | POA: Diagnosis not present

## 2019-01-02 DIAGNOSIS — L97212 Non-pressure chronic ulcer of right calf with fat layer exposed: Secondary | ICD-10-CM | POA: Diagnosis not present

## 2019-01-02 DIAGNOSIS — S51802A Unspecified open wound of left forearm, initial encounter: Secondary | ICD-10-CM | POA: Diagnosis not present

## 2019-01-02 DIAGNOSIS — Z8249 Family history of ischemic heart disease and other diseases of the circulatory system: Secondary | ICD-10-CM | POA: Diagnosis not present

## 2019-01-02 DIAGNOSIS — N183 Chronic kidney disease, stage 3 unspecified: Secondary | ICD-10-CM | POA: Diagnosis not present

## 2019-01-02 DIAGNOSIS — S81801A Unspecified open wound, right lower leg, initial encounter: Secondary | ICD-10-CM | POA: Diagnosis not present

## 2019-01-02 DIAGNOSIS — I130 Hypertensive heart and chronic kidney disease with heart failure and stage 1 through stage 4 chronic kidney disease, or unspecified chronic kidney disease: Secondary | ICD-10-CM | POA: Diagnosis not present

## 2019-01-02 NOTE — Progress Notes (Addendum)
Traci, Cook ([id number]) Visit Report for 01/02/2019 Chief Complaint Document Details Patient Name: [name], [name] Date of Service: 01/02/2019 1:45 PM Medical Record Number: [id number] Patient Account Number: [id number] Date of Birth/Sex: 19-Jan-1921 (85 y.o. F) Treating RN: Montey Hora Primary Care Provider: Ria Bush Other Clinician: Referring Provider: Ria Bush Treating Provider/Extender: Melburn Hake, HOYT Weeks in Treatment: 2 Information Obtained from: Patient Chief Complaint Left arm, left forearm, and right LE skin tears Electronic Signature(s) Signed: 01/02/2019 3:45:11 PM By: Worthy Keeler PA-C Entered By: Worthy Keeler on 01/02/2019 13:51:59 Brozek, [name] ([id number]) -------------------------------------------------------------------------------- HPI Details Patient Name: [name] Date of Service: 01/02/2019 1:45 PM Medical Record Number: [id number] Patient Account Number: [id number] Date of Birth/Sex: 22-Aug-1920 (85 y.o. F) Treating RN: Montey Hora Primary Care Provider: Ria Bush Other Clinician: Referring Provider: Ria Bush Treating Provider/Extender: Melburn Hake, HOYT Weeks in Treatment: 2 History of Present Illness HPI Description: 12/15/2018 on evaluation today patient presents for initial evaluation here in our clinic concerning issues that she has been having at multiple locations with skin tears. She had a fall roughly 2 weeks ago where she injured her left upper arm and forearm at that time. Subsequently she ended up as well with a skin tear to her leg when her family members were trying to get her into the bed her skin is so fragile that as she was lifting her legs the skin on the back of the leg tore. That is on the right. Nonetheless they have been trying to manage these I did see her primary care provider earlier in the week who referred her to [name] for further evaluation and treatment. Currently there  does not appear to be any signs of active infection at this time which is good news at any site. No fevers, chills, nausea, vomiting, or diarrhea. She does have pain but this is mainly on the leg ulcer the arms are really not to painful at this point. The patient does have a history of dementia, hypertension, congestive heart failure, and chronic kidney disease stage III. 12/29/2018 on evaluation today patient appears to be doing better in regard to her skin tears on the forearm both are healing quite nicely one is close the other is slightly hyper granulated which is probably preventing complete epithelization. With regard to her right lower extremity unfortunately she is still having a lot of dressing material/alginate which is stuck to the wound bed. This seems to be a result of unfortunately having a different type of alginate than what we actually ordered for her. We had ordered silver cell as this was more nonstick unfortunately she received a different alginate which is breaking down and getting somewhat matted into the wound. In fact I would have to try to get some of this off today if at all possible. Other than that she seems to actually be healing nicely she has much less drainage than previously noted. 01/02/19 on evaluation today patient appears to be doing well with regard to her left forearm and right lower extremity ulcers. The left forearm appears to be completely healed which is great news the right leg is doing much better that there's still some eschar/dressing material stuck to the wound it appears to be healing underneath quite well. The Xeroform did much better for her. Electronic Signature(s) Signed: 01/08/2019 10:21:37 AM By: Worthy Keeler PA-C Previous Signature: 01/02/2019 3:45:11 PM Version By: Worthy Keeler PA-C Entered By: Worthy Keeler on 01/08/2019 10:21:36 Pinn, Laterica W.  (  [id number]) -------------------------------------------------------------------------------- Physical Exam Details Patient Name: Traci, Cook Date of Service: 01/02/2019 1:45 PM Medical Record Number: [id number] Patient Account Number: [id number] Date of Birth/Sex: May 27, 1920 (85 y.o. F) Treating RN: Montey Hora Primary Care Provider: Ria Bush Other Clinician: Referring Provider: Ria Bush Treating Provider/Extender: STONE III, HOYT Weeks in Treatment: 2 Constitutional Well-nourished and well-hydrated in no acute distress. Respiratory normal breathing without difficulty. Psychiatric this patient is able to make decisions and demonstrates good insight into disease process. Alert and Oriented x 3. pleasant and cooperative. Notes Patient's wounds currently appear to be doing well. We have been using Xeroform dressing currently and I feel like this is doing much better for her the alginate was it was getting stuck to the wound bed. Electronic Signature(s) Signed: 01/02/2019 3:45:11 PM By: Worthy Keeler PA-C Entered By: Worthy Keeler on 01/02/2019 14:34:15 Waltman, [name] ([id number]) -------------------------------------------------------------------------------- Physician Orders Details Patient Name: [name] Date of Service: 01/02/2019 1:45 PM Medical Record Number: [id number] Patient Account Number: [id number] Date of Birth/Sex: 12/24/1920 (85 y.o. F) Treating RN: Montey Hora Primary Care Provider: Ria Bush Other Clinician: Referring Provider: Ria Bush Treating Provider/Extender: Melburn Hake, HOYT Weeks in Treatment: 2 Verbal / Phone Orders: No Diagnosis Coding ICD-10 Coding Code Description S41.102A Unspecified open wound of left upper arm, initial encounter S51.802A Unspecified open wound of left forearm, initial encounter S81.801A Unspecified open wound, right lower leg, initial encounter F03.90 Unspecified dementia  without behavioral disturbance I10 Essential (primary) hypertension 99991111 Chronic diastolic (congestive) heart failure N18.30 Chronic kidney disease, stage 3 unspecified Wound Cleansing Wound #3 Right,Posterior Lower Leg o Clean wound with Normal Saline. o Cleanse wound with mild soap and water o May Shower, gently pat wound dry prior to applying new dressing. Primary Wound Dressing Wound #3 Right,Posterior Lower Leg o Xeroform Secondary Dressing Wound #3 Right,Posterior Lower Leg o ABD and Kerlix/Conform - secure with TubiGrip F Dressing Change Frequency Wound #3 Right,Posterior Lower Leg o Change dressing every other day. Follow-up Appointments o Return Appointment in 1 week. Home Health Wound #3 [place] Visits o Home Health Nurse may visit PRN to address patientos wound care needs. o FACE TO FACE ENCOUNTER: MEDICARE and MEDICAID PATIENTS: I certify that this patient is under my care and that I had a face-to-face encounter that meets the physician face-to-face encounter requirements with this patient on this date. The encounter with the patient was in whole or in part for the following MEDICAL CONDITION: (primary reason for [place]) MEDICAL NECESSITY: I certify, that based on my findings, NURSING services are a medically necessary home health service. HOME BOUND STATUS: I certify that my clinical findings support that this patient is homebound (i.e., Due to illness or injury, pt requires aid of Deroo, ISALY DELEHANTY. ([id number]) supportive devices such as crutches, cane, wheelchairs, walkers, the use of special transportation or the assistance of another person to leave their place of residence. There is a normal inability to leave the home and doing so requires considerable and taxing effort. Other absences are for medical reasons / religious services and are infrequent or of short duration when for other  reasons). o If current dressing causes regression in wound condition, may D/C ordered dressing product/s and apply Normal Saline Moist Dressing daily until next [place] / Other MD appointment. [place] of regression in wound condition at [phone]. o Please direct any NON-WOUND related issues/requests for orders to patient's Primary Care Physician Electronic  Signature(s) Signed: 01/02/2019 3:45:11 PM By: Worthy Keeler PA-C Signed: 01/02/2019 4:13:22 PM By: Montey Hora Entered By: Montey Hora on 01/02/2019 14:20:24 [name] ([id number]) -------------------------------------------------------------------------------- Problem List Details Patient Name: [name] Date of Service: 01/02/2019 1:45 PM Medical Record Number: [id number] Patient Account Number: [id number] Date of Birth/Sex: 05/04/1920 (85 y.o. F) Treating RN: Montey Hora Primary Care Provider: Ria Bush Other Clinician: Referring Provider: Ria Bush Treating Provider/Extender: Melburn Hake, HOYT Weeks in Treatment: 2 Active Problems ICD-10 Evaluated Encounter Code Description Active Date Today Diagnosis S41.102A Unspecified open wound of left upper arm, initial encounter 12/15/2018 No Yes S51.802A Unspecified open wound of left forearm, initial encounter 12/15/2018 No Yes S81.801A Unspecified open wound, right lower leg, initial encounter 12/15/2018 No Yes F03.90 Unspecified dementia without behavioral disturbance 12/15/2018 No Yes I10 Essential (primary) hypertension 12/15/2018 No Yes [id number] Chronic diastolic (congestive) heart failure 12/15/2018 No Yes N18.30 Chronic kidney disease, stage 3 unspecified 12/15/2018 No Yes Inactive Problems Resolved Problems Electronic Signature(s) Signed: 01/02/2019 3:45:11 PM By: Worthy Keeler PA-C Entered By: Worthy Keeler on 01/02/2019 13:51:54 Kauth, [name]  ([id number]) -------------------------------------------------------------------------------- Progress Note Details Patient Name: [name] Date of Service: 01/02/2019 1:45 PM Medical Record Number: [id number] Patient Account Number: [id number] Date of Birth/Sex: 08-29-1920 (85 y.o. F) Treating RN: Montey Hora Primary Care Provider: Ria Bush Other Clinician: Referring Provider: Ria Bush Treating Provider/Extender: Melburn Hake, HOYT Weeks in Treatment: 2 Subjective Chief Complaint Information obtained from Patient Left arm, left forearm, and right LE skin tears History of Present Illness (HPI) 12/15/2018 on evaluation today patient presents for initial evaluation here in our clinic concerning issues that she has been having at multiple locations with skin tears. She had a fall roughly 2 weeks ago where she injured her left upper arm and forearm at that time. Subsequently she ended up as well with a skin tear to her leg when her family members were trying to get her into the bed her skin is so fragile that as she was lifting her legs the skin on the back of the leg tore. That is on the right. Nonetheless they have been trying to manage these I did see her primary care provider earlier in the week who referred her to [name] for further evaluation and treatment. Currently there does not appear to be any signs of active infection at this time which is good news at any site. No fevers, chills, nausea, vomiting, or diarrhea. She does have pain but this is mainly on the leg ulcer the arms are really not to painful at this point. The patient does have a history of dementia, hypertension, congestive heart failure, and chronic kidney disease stage III. 12/29/2018 on evaluation today patient appears to be doing better in regard to her skin tears on the forearm both are healing quite nicely one is close the other is slightly hyper granulated which is probably preventing complete  epithelization. With regard to her right lower extremity unfortunately she is still having a lot of dressing material/alginate which is stuck to the wound bed. This seems to be a result of unfortunately having a different type of alginate than what we actually ordered for her. We had ordered silver cell as this was more nonstick unfortunately she received a different alginate which is breaking down and getting somewhat matted into the wound. In fact I would have to try to get some of this off today if at all possible. Other than that she seems to actually  be healing nicely she has much less drainage than previously noted. 01/02/19 on evaluation today patient appears to be doing well with regard to her left forearm and right lower extremity ulcers. The left forearm appears to be completely healed which is great news the right leg is doing much better that there's still some eschar/dressing material stuck to the wound it appears to be healing underneath quite well. The Xeroform did much better for her. Patient History Information obtained from Patient. Allergies No Known Allergies Family History Diabetes - Child, Hypertension - Child, Lung Disease - Child, No family history of Cancer, Heart Disease, Hereditary Spherocytosis, Kidney Disease, Seizures, Stroke, Thyroid Problems, Tuberculosis. Social History Never smoker, Marital Status - Widowed, Alcohol Use - Never, Drug Use - No History, Caffeine Use - Rarely. Medical History Eyes JAGGER, WOTEN. ([id number]) Denies history of Cataracts, Glaucoma Ear/Nose/Mouth/Throat Denies history of Chronic sinus problems/congestion, Middle ear problems Hematologic/Lymphatic Denies history of Anemia, Hemophilia, Human Immunodeficiency Virus, Lymphedema, Sickle Cell Disease Respiratory Denies history of Aspiration, Asthma, Chronic Obstructive Pulmonary Disease (COPD), Pneumothorax, Sleep Apnea, Tuberculosis Cardiovascular Denies history of Angina,  Arrhythmia, Congestive Heart Failure, Coronary Artery Disease, Deep Vein Thrombosis, Hypertension, Hypotension, Myocardial Infarction, Peripheral Arterial Disease, Peripheral Venous Disease, Phlebitis, Vasculitis Gastrointestinal Denies history of Cirrhosis , Colitis, Crohn s, Hepatitis A, Hepatitis B, Hepatitis C Endocrine Denies history of Type I Diabetes, Type II Diabetes Genitourinary Denies history of End Stage Renal Disease Immunological Denies history of Lupus Erythematosus, Raynaud s, Scleroderma Integumentary (Skin) Denies history of History of Burn, History of pressure wounds Musculoskeletal Denies history of Gout, Rheumatoid Arthritis, Osteoarthritis, Osteomyelitis Neurologic Denies history of Dementia, Neuropathy, Quadriplegia, Paraplegia, Seizure Disorder Oncologic Denies history of Received Chemotherapy, Received Radiation Psychiatric Denies history of Anorexia/bulimia, Confinement Anxiety Objective Constitutional Well-nourished and well-hydrated in no acute distress. Vitals Time Taken: 1:57 PM, Height: 63 in, Weight: 113 lbs, BMI: 20, Temperature: 98.1 F, Pulse: 90 bpm, Respiratory Rate: 16 breaths/min, Blood Pressure: 130/70 mmHg. Respiratory normal breathing without difficulty. Psychiatric this patient is able to make decisions and demonstrates good insight into disease process. Alert and Oriented x 3. pleasant and cooperative. General Notes: Patient's wounds currently appear to be doing well. We have been using Xeroform dressing currently and I Moser, Jaiyanna W. ([id number]) feel like this is doing much better for her the alginate was it was getting stuck to the wound bed. Integumentary (Hair, Skin) Wound #2 status is Healed - Epithelialized. Original cause of wound was Trauma. The wound is located on the Left,Lateral Forearm. The wound measures 0cm length x 0cm width x 0cm depth; 0cm^2 area and 0cm^3 volume. There is Fat Layer (Subcutaneous Tissue) Exposed  exposed. There is no tunneling or undermining noted. There is a medium amount of sanguinous drainage noted. The wound margin is flat and intact. There is small (1-33%) red granulation within the wound bed. There is a medium (34-66%) amount of necrotic tissue within the wound bed including Adherent Slough. Wound #3 status is Open. Original cause of wound was Trauma. The wound is located on the Right,Posterior Lower Leg. The wound measures 9cm length x 5.2cm width x 0.1cm depth; 36.757cm^2 area and 3.676cm^3 volume. There is Fat Layer (Subcutaneous Tissue) Exposed exposed. There is no tunneling or undermining noted. There is a medium amount of sanguinous drainage noted. The wound margin is flat and intact. There is small (1-33%) red granulation within the wound bed. There is a large (67-100%) amount of necrotic tissue within the wound bed including Eschar and Adherent  Slough. Assessment Active Problems ICD-10 Unspecified open wound of left upper arm, initial encounter Unspecified open wound of left forearm, initial encounter Unspecified open wound, right lower leg, initial encounter Unspecified dementia without behavioral disturbance Essential (primary) hypertension Chronic diastolic (congestive) heart failure Chronic kidney disease, stage 3 unspecified Plan Wound Cleansing: Wound #3 Right,Posterior Lower Leg: Clean wound with Normal Saline. Cleanse wound with mild soap and water May Shower, gently pat wound dry prior to applying new dressing. Primary Wound Dressing: Wound #3 Right,Posterior Lower Leg: Xeroform Secondary Dressing: Wound #3 Right,Posterior Lower Leg: ABD and Kerlix/Conform - secure with TubiGrip F Dressing Change Frequency: Wound #3 Right,Posterior Lower Leg: Change dressing every other day. Follow-up Appointments: Return Appointment in 1 week. Home Health: Wound #3 Right,Posterior Lower Leg: [place] Nurse may visit PRN to address  patient s wound care needs. SHERLONDA, GALLMAN ([id number]) FACE TO FACE ENCOUNTER: MEDICARE and MEDICAID PATIENTS: I certify that this patient is under my care and that I had a face-to-face encounter that meets the physician face-to-face encounter requirements with this patient on this date. The encounter with the patient was in whole or in part for the following MEDICAL CONDITION: (primary reason for [place]) MEDICAL NECESSITY: I certify, that based on my findings, NURSING services are a medically necessary home health service. HOME BOUND STATUS: I certify that my clinical findings support that this patient is homebound (i.e., Due to illness or injury, pt requires aid of supportive devices such as crutches, cane, wheelchairs, walkers, the use of special transportation or the assistance of another person to leave their place of residence. There is a normal inability to leave the home and doing so requires considerable and taxing effort. Other absences are for medical reasons / religious services and are infrequent or of short duration when for other reasons). If current dressing causes regression in wound condition, may D/C ordered dressing product/s and apply Normal Saline Moist Dressing daily until next [place] / Other MD appointment. [place] of regression in wound condition at [phone]. Please direct any NON-WOUND related issues/requests for orders to patient's Primary Care Physician On evaluation today patient appears to be doing excellent with regard to the overall appearance of her wound on the right lower extremity. She's doing well with the Xeroform something recommend that we continue this as such. Patient's daughter is in agreement with that plan. With regard to the left arm ulcer this has completely healed and is doing well. There are no signs of infection in either location which is also good news. Please see above for specific wound care  orders. We will see patient for re-evaluation in 1 week(s) here in the clinic. If anything worsens or changes patient will contact our office for additional recommendations. Electronic Signature(s) Signed: 01/08/2019 10:21:57 AM By: Worthy Keeler PA-C Previous Signature: 01/02/2019 3:45:11 PM Version By: Worthy Keeler PA-C Entered By: Worthy Keeler on 01/08/2019 10:21:57 [name] ([id number]) -------------------------------------------------------------------------------- ROS/PFSH Details Patient Name: [name] Date of Service: 01/02/2019 1:45 PM Medical Record Number: [id number] Patient Account Number: [id number] Date of Birth/Sex: [birth year] (85 y.o. F) Treating RN: Army Melia Primary Care Provider: Ria Bush Other Clinician: Referring Provider: Ria Bush Treating Provider/Extender: Melburn Hake, HOYT Weeks in Treatment: 2 Information Obtained From Patient Eyes Medical History: Negative for: Cataracts; Glaucoma Ear/Nose/Mouth/Throat Medical History: Negative for: Chronic sinus problems/congestion; Middle ear problems Hematologic/Lymphatic Medical History: Negative for: Anemia; Hemophilia; Human Immunodeficiency Virus; Lymphedema; Sickle Cell  Disease Respiratory Medical History: Negative for: Aspiration; Asthma; Chronic Obstructive Pulmonary Disease (COPD); Pneumothorax; Sleep Apnea; Tuberculosis Cardiovascular Medical History: Negative for: Angina; Arrhythmia; Congestive Heart Failure; Coronary Artery Disease; Deep Vein Thrombosis; Hypertension; Hypotension; Myocardial Infarction; Peripheral Arterial Disease; Peripheral Venous Disease; Phlebitis; Vasculitis Gastrointestinal Medical History: Negative for: Cirrhosis ; Colitis; Crohnos; Hepatitis A; Hepatitis B; Hepatitis C Endocrine Medical History: Negative for: Type I Diabetes; Type II Diabetes Genitourinary Medical History: Negative for: End Stage Renal  Disease Immunological Medical History: Negative for: Lupus Erythematosus; Raynaudos; Scleroderma Dillion, Gilbert W. ([id number]) Integumentary (Skin) Medical History: Negative for: History of Burn; History of pressure wounds Musculoskeletal Medical History: Negative for: Gout; Rheumatoid Arthritis; Osteoarthritis; Osteomyelitis Neurologic Medical History: Negative for: Dementia; Neuropathy; Quadriplegia; Paraplegia; Seizure Disorder Oncologic Medical History: Negative for: Received Chemotherapy; Received Radiation Psychiatric Medical History: Negative for: Anorexia/bulimia; Confinement Anxiety Immunizations Pneumococcal Vaccine: Received Pneumococcal Vaccination: Yes Implantable Devices None Family and Social History Cancer: No; Diabetes: Yes - Child; Heart Disease: No; Hereditary Spherocytosis: No; Hypertension: Yes - Child; Kidney Disease: No; Lung Disease: Yes - Child; Seizures: No; Stroke: No; Thyroid Problems: No; Tuberculosis: No; Never smoker; Marital Status - Widowed; Alcohol Use: Never; Drug Use: No History; Caffeine Use: Rarely; Financial Concerns: No; Food, Clothing or Shelter Needs: No; Support System Lacking: No; Transportation Concerns: No Electronic Signature(s) Signed: 01/02/2019 3:45:11 PM By: Worthy Keeler PA-C Signed: 01/02/2019 4:00:27 PM By: Army Melia Entered By: Army Melia on 01/02/2019 14:03:07 Borrayo, [name] ([id number]) -------------------------------------------------------------------------------- SuperBill Details Patient Name: [name] Date of Service: 01/02/2019 Medical Record Number: [id number] Patient Account Number: [id number] Date of Birth/Sex: 12/13/1920 (85 y.o. F) Treating RN: Montey Hora Primary Care Provider: Ria Bush Other Clinician: Referring Provider: Ria Bush Treating Provider/Extender: Melburn Hake, HOYT Weeks in Treatment: 2 Diagnosis Coding ICD-10 Codes Code Description S41.102A Unspecified  open wound of left upper arm, initial encounter S51.802A Unspecified open wound of left forearm, initial encounter S81.801A Unspecified open wound, right lower leg, initial encounter F03.90 Unspecified dementia without behavioral disturbance I10 Essential (primary) hypertension [id number] Chronic diastolic (congestive) heart failure N18.30 Chronic kidney disease, stage 3 unspecified Facility Procedures CPT4 Code: [id number] Description: [facility] VISIT-LEV 3 EST PT Modifier: Quantity: 1 CPT4 Code: [id number] Description: [id number] Telehealth originating site facility fee. Modifier: Quantity: 1 Physician Procedures CPT4 Code: [id number] Description: [id number] - WC PHYS LEVEL 3 - EST PT ICD-10 Diagnosis Description [id number] Unspecified open wound of left upper arm, initial encount S51.802A Unspecified open wound of left forearm, initial encounter S81.801A Unspecified open wound, right lower  leg, initial encounte F03.90 Unspecified dementia without behavioral disturbance Modifier: er r Quantity: 1 Electronic Signature(s) Signed: 01/02/2019 3:12:18 PM By: Gretta Cool, BSN, RN, CWS, Kim RN, BSN Signed: 01/02/2019 3:45:11 PM By: Worthy Keeler PA-C Entered By: Gretta Cool, BSN, RN, CWS, Kim on 01/02/2019 15:12:17

## 2019-01-02 NOTE — Progress Notes (Signed)
Traci, Cook ([id number]) Visit Report for 01/02/2019 Allergy List Details Patient Name: [name], [name] Date of Service: 01/02/2019 1:45 PM Medical Record Number: [id number] Patient Account Number: [id number] Date of Birth/Sex: 13-Mar-1920 (85 y.o. F) Treating RN: Army Melia Primary Care Cobin Cadavid: Ria Bush Other Clinician: Referring Sheena Simonis: Ria Bush Treating Kemauri Musa/Extender: STONE III, HOYT Weeks in Treatment: 2 Allergies Active Allergies No Known Allergies Allergy Notes Electronic Signature(s) Signed: 01/02/2019 4:00:27 PM By: Army Melia Entered By: Army Melia on 01/02/2019 14:03:00 Leabo, [name] ([id number]) -------------------------------------------------------------------------------- Patient/Caregiver Education Details Patient Name: [name] Date of Service: 01/02/2019 1:45 PM Medical Record Number: [id number] Patient Account Number: [id number] Date of Birth/Gender: Jan 19, 1921 (85 y.o. F) Treating RN: Montey Hora Primary Care Physician: Ria Bush Other Clinician: Referring Physician: Ria Bush Treating Physician/Extender: Sharalyn Ink in Treatment: 2 Education Assessment Education Provided To: Patient and Caregiver Education Topics Provided Wound/Skin Impairment: Handouts: Other: wound care as ordered Methods: Demonstration, Explain/Verbal Responses: State content correctly Electronic Signature(s) Signed: 01/02/2019 4:13:22 PM By: Montey Hora Entered By: Montey Hora on 01/02/2019 14:22:38 Menon, [name] ([id number]) -------------------------------------------------------------------------------- Wound Assessment Details Patient Name: [name] Date of Service: 01/02/2019 1:45 PM Medical Record Number: [id number] Patient Account Number: [id number] Date of Birth/Sex: 12-17-1920 (85 y.o. F) Treating RN: Montey Hora Primary Care Bryse Blanchette: Ria Bush Other  Clinician: Referring Bradon Fester: Ria Bush Treating Estephania Licciardi/Extender: Melburn Hake, HOYT Weeks in Treatment: 2 Wound Status Wound Number: 2 Primary Etiology: Skin Tear Wound Location: Left, Lateral Forearm Wound Status: Healed - Epithelialized Wounding Event: Trauma Date Acquired: 12/09/2018 Weeks Of Treatment: 2 Clustered Wound: No Photos Wound Measurements Length: (cm) 0 % Reduct Width: (cm) 0 % Reduct Depth: (cm) 0 Epitheli Area: (cm) 0 Tunneli Volume: (cm) 0 Undermi ion in Area: 100% ion in Volume: 100% alization: None ng: No ning: No Wound Description Full Thickness Without Exposed Support Foul Odo Classification: Structures Slough/F Wound Margin: Flat and Intact Exudate Medium Amount: Exudate Type: Sanguinous Exudate Color: red r After Cleansing: No ibrino Yes Wound Bed Granulation Amount: Small (1-33%) Exposed Structure Granulation Quality: Red Fascia Exposed: No Necrotic Amount: Medium (34-66%) Fat Layer (Subcutaneous Tissue) Exposed: Yes Necrotic Quality: Adherent Slough Tendon Exposed: No Muscle Exposed: No Joint Exposed: No Bone Exposed: No EPIFANIA, CUCINOTTA ([id number]) Electronic Signature(s) Signed: 01/02/2019 4:13:22 PM By: Montey Hora Entered By: Montey Hora on 01/02/2019 14:19:45 Lem, [name] ([id number]) -------------------------------------------------------------------------------- Wound Assessment Details Patient Name: [name] Date of Service: 01/02/2019 1:45 PM Medical Record Number: [id number] Patient Account Number: [id number] Date of Birth/Sex: 1920/04/29 (85 y.o. F) Treating RN: Army Melia Primary Care Jaynia Fendley: Ria Bush Other Clinician: Referring Haden Cavenaugh: Ria Bush Treating Anik Wesch/Extender: Melburn Hake, HOYT Weeks in Treatment: 2 Wound Status Wound Number: 3 Primary Etiology: Skin Tear Wound Location: Right Lower Leg - Posterior Wound Status: Open Wounding Event: Trauma Date  Acquired: 12/09/2018 Weeks Of Treatment: 2 Clustered Wound: No Photos Wound Measurements Length: (cm) 9 Width: (cm) 5.2 Depth: (cm) 0.1 Area: (cm) 36.757 Volume: (cm) 3.676 % Reduction in Area: 14.9% % Reduction in Volume: 14.9% Epithelialization: None Tunneling: No Undermining: No Wound Description Full Thickness Without Exposed Support Foul Odo Classification: Structures Slough/F Wound Margin: Flat and Intact Exudate Medium Amount: Exudate Type: Sanguinous Exudate Color: red r After Cleansing: No ibrino Yes Wound Bed Granulation Amount: Small (1-33%) Exposed Structure Granulation Quality: Red Fascia Exposed: No Necrotic Amount: Large (67-100%) Fat Layer (Subcutaneous Tissue) Exposed: Yes Necrotic Quality: Eschar, Adherent Slough Tendon Exposed: No Muscle Exposed: No Joint  Exposed: No Bone Exposed: No Ibe, SHARQUITA KUCK ([id number]) Electronic Signature(s) Signed: 01/02/2019 4:00:27 PM By: Army Melia Entered By: Army Melia on 01/02/2019 14:02:51 Hagenow, [name] ([id number]) -------------------------------------------------------------------------------- Vitals Details Patient Name: [name] Date of Service: 01/02/2019 1:45 PM Medical Record Number: [id number] Patient Account Number: [id number] Date of Birth/Sex: 05/11/1920 (85 y.o. F) Treating RN: Army Melia Primary Care Sherrilynn Gudgel: Ria Bush Other Clinician: Referring Domnique Vanegas: Ria Bush Treating Cooper Moroney/Extender: Melburn Hake, HOYT Weeks in Treatment: 2 Vital Signs Time Taken: 13:57 Temperature (F): 98.1 Height (in): 63 Pulse (bpm): 90 Weight (lbs): 113 Respiratory Rate (breaths/min): 16 Body Mass Index (BMI): 20 Blood Pressure (mmHg): 130/70 Reference Range: 80 - 120 mg / dl Electronic Signature(s) Signed: 01/02/2019 4:00:27 PM By: Army Melia Entered By: Army Melia on 01/02/2019 13:57:28

## 2019-01-03 NOTE — Progress Notes (Signed)
[name], [name] ([id number]) Visit Report for 12/15/2018 Abuse/Suicide Risk Screen Details Patient Name: [name], [name] Date of Service: 12/15/2018 11:15 AM Medical Record Number: [id number] Patient Account Number: [id number] Date of Birth/Sex: 10-08-1920 (85 y.o. F) Treating RN: Army Melia Primary Care Lakota Markgraf: Ria Bush Other Clinician: Referring Nicholad Kautzman: Ria Bush Treating Jillien Yakel/Extender: STONE III, HOYT Weeks in Treatment: 0 Abuse/Suicide Risk Screen Items Answer ABUSE RISK SCREEN: Has anyone close to you tried to hurt or harm you recentlyo No Do you feel uncomfortable with anyone in your familyo No Has anyone forced you do things that you didnot want to doo No Electronic Signature(s) Signed: 12/18/2018 5:38:10 PM By: Army Melia Entered By: Army Melia on 12/15/2018 11:30:02 Wallenstein, [name] ([id number]) -------------------------------------------------------------------------------- Activities of Daily Living Details Patient Name: [name] Date of Service: 12/15/2018 11:15 AM Medical Record Number: [id number] Patient Account Number: [id number] Date of Birth/Sex: 02/14/1920 (85 y.o. F) Treating RN: Army Melia Primary Care Myanna Ziesmer: Ria Bush Other Clinician: Referring Ishaaq Penna: Ria Bush Treating Shanti Eichel/Extender: Melburn Hake, HOYT Weeks in Treatment: 0 Activities of Daily Living Items Answer Activities of Daily Living (Please select one for each item) Drive Automobile Not Able Take Medications Need Assistance Use Telephone Completely Able Care for Appearance Need Assistance Use Toilet Need Assistance Bath / Shower Need Assistance Dress Self Need Assistance Feed Self Completely Able Walk Need Assistance Get In / Out Bed Need Assistance Housework Need Assistance Prepare Meals Need Assistance Handle Money Need Assistance Shop for Self Need Assistance Electronic Signature(s) Signed: 12/18/2018 5:38:10 PM By:  Army Melia Entered By: Army Melia on 12/15/2018 11:30:27 [place]luis, [name] ([id number]) -------------------------------------------------------------------------------- Education Screening Details Patient Name: [name] Date of Service: 12/15/2018 11:15 AM Medical Record Number: [id number] Patient Account Number: [id number] Date of Birth/Sex: 12-Apr-1920 (85 y.o. F) Treating RN: Army Melia Primary Care Karen Huhta: Ria Bush Other Clinician: Referring Tracina Beaumont: Ria Bush Treating Caetano Oberhaus/Extender: Melburn Hake, HOYT Weeks in Treatment: 0 Learning Preferences/Education Level/Primary Language Learning Preference: Explanation Highest Education Level: High School Preferred Language: English Cognitive Barrier Language Barrier: No Translator Needed: No Memory Deficit: Yes Emotional Barrier: No Cultural/Religious Beliefs Affecting Medical Care: No Physical Barrier Impaired Vision: No Impaired Hearing: No Decreased Hand dexterity: No Knowledge/Comprehension Knowledge Level: High Comprehension Level: High Ability to understand written High instructions: Ability to understand verbal High instructions: Motivation Anxiety Level: Calm Cooperation: Cooperative Education Importance: Acknowledges Need Interest in Health Problems: Asks Questions Perception: Coherent Willingness to Engage in Self- High Management Activities: Readiness to Engage in Self- High Management Activities: Electronic Signature(s) Signed: 12/18/2018 5:38:10 PM By: Army Melia Entered By: Army Melia on 12/15/2018 11:30:45 Godina, [name] ([id number]) -------------------------------------------------------------------------------- Fall Risk Assessment Details Patient Name: [name] Date of Service: 12/15/2018 11:15 AM Medical Record Number: [id number] Patient Account Number: [id number] Date of Birth/Sex: 1920/03/09 (85 y.o. F) Treating RN: Army Melia Primary Care  Sharyl Panchal: Ria Bush Other Clinician: Referring Sonny Poth: Ria Bush Treating Omid Deardorff/Extender: Melburn Hake, HOYT Weeks in Treatment: 0 Fall Risk Assessment Items Have you had 2 or more falls in the last 12 monthso 0 No Have you had any fall that resulted in injury in the last 12 monthso 0 No FALLS RISK SCREEN History of falling - immediate or within 3 months 0 No Secondary diagnosis (Do you have 2 or more medical diagnoseso) 0 No Ambulatory aid None/bed rest/wheelchair/nurse 0 No Crutches/cane/walker 0 No Furniture 0 No Intravenous therapy Access/Saline/Heparin Lock 0 No Gait/Transferring Normal/ bed rest/ wheelchair 0 No Weak (short steps with or without  shuffle, stooped but able to lift head while 0 No walking, may seek support from furniture) Impaired (short steps with shuffle, may have difficulty arising from chair, head 0 No down, impaired balance) Mental Status Oriented to own ability 0 No Electronic Signature(s) Signed: 12/18/2018 5:38:10 PM By: Army Melia Entered By: Army Melia on 12/15/2018 11:30:50 Vandagriff, [name] ([id number]) -------------------------------------------------------------------------------- Foot Assessment Details Patient Name: [name] Date of Service: 12/15/2018 11:15 AM Medical Record Number: [id number] Patient Account Number: [id number] Date of Birth/Sex: 15-May-1920 (85 y.o. F) Treating RN: Army Melia Primary Care Ayo Guarino: Ria Bush Other Clinician: Referring Ryley Bachtel: Ria Bush Treating Sai Moura/Extender: Melburn Hake, HOYT Weeks in Treatment: 0 Foot Assessment Items [x]  Unable to perform due to altered mental status Site Locations + = Sensation present, - = Sensation absent, C = Callus, U = Ulcer R = Redness, W = Warmth, M = Maceration, PU = Pre-ulcerative lesion F = Fissure, S = Swelling, D = Dryness Assessment Right: Left: Other Deformity: No No Prior Foot Ulcer: No No Prior Amputation: No  No Charcot Joint: No No Ambulatory Status: Ambulatory With Help Assistance Device: Walker Gait: Administrator, arts) Signed: 12/18/2018 5:38:10 PM By: Army Melia Entered By: Army Melia on 12/15/2018 11:31:07 Spruell, [name] ([id number]) -------------------------------------------------------------------------------- Nutrition Risk Screening Details Patient Name: [name] Date of Service: 12/15/2018 11:15 AM Medical Record Number: [id number] Patient Account Number: [id number] Date of Birth/Sex: 1920-08-06 (85 y.o. F) Treating RN: Army Melia Primary Care Ronit Cranfield: Ria Bush Other Clinician: Referring Nyair Depaulo: Ria Bush Treating Lakrisha Iseman/Extender: STONE III, HOYT Weeks in Treatment: 0 Height (in): 63 Weight (lbs): 113 Body Mass Index (BMI): 20 Nutrition Risk Screening Items Score Screening NUTRITION RISK SCREEN: I have an illness or condition that made me change the kind and/or amount of 0 No food I eat I eat fewer than two meals per day 0 No I eat few fruits and vegetables, or milk products 0 No I have three or more drinks of beer, liquor or wine almost every day 0 No I have tooth or mouth problems that make it hard for me to eat 0 No I don't always have enough money to buy the food I need 0 No I eat alone most of the time 0 No I take three or more different prescribed or over-the-counter drugs a day 0 No Without wanting to, I have lost or gained 10 pounds in the last six months 0 No I am not always physically able to shop, cook and/or feed myself 0 No Nutrition Protocols Good Risk Protocol 0 No interventions needed Moderate Risk Protocol High Risk Proctocol Risk Level: Good Risk Score: 0 Electronic Signature(s) Signed: 12/18/2018 5:38:10 PM By: Army Melia Entered By: Army Melia on 12/15/2018 11:30:55

## 2019-01-03 NOTE — Progress Notes (Signed)
[name], [name] ([id number]) Visit Report for 12/15/2018 Allergy List Details Patient Name: [name], [name] Date of Service: 12/15/2018 11:15 AM Medical Record Number: [id number] Patient Account Number: [id number] Date of Birth/Sex: [birth year] (85 y.o. F) Treating RN: Army Melia Primary Care Danah Reinecke: Ria Bush Other Clinician: Referring Darius Lundberg: Ria Bush Treating Marcianna Daily/Extender: STONE III, HOYT Weeks in Treatment: 0 Allergies Active Allergies No Known Allergies Allergy Notes Electronic Signature(s) Signed: 12/18/2018 5:38:10 PM By: Army Melia Entered By: Army Melia on 12/15/2018 11:25:16 [name], [name] ([id number]) -------------------------------------------------------------------------------- Arrival Information Details Patient Name: [name] Date of Service: 12/15/2018 11:15 AM Medical Record Number: [id number] Patient Account Number: [id number] Date of Birth/Sex: 08-27-1920 (85 y.o. F) Treating RN: Army Melia Primary Care Shiquita Collignon: Ria Bush Other Clinician: Referring Rhodia Acres: Ria Bush Treating Tyffany Waldrop/Extender: Melburn Hake, HOYT Weeks in Treatment: 0 Visit Information Patient Arrived: Walker Arrival Time: 11:23 Accompanied By: family Transfer Assistance: None Patient Identification Verified: Yes Electronic Signature(s) Signed: 12/18/2018 5:38:10 PM By: Army Melia Entered By: Army Melia on 12/15/2018 11:23:33 Gianino, [name] ([id number]) -------------------------------------------------------------------------------- Clinic Level of Care Assessment Details Patient Name: [name] Date of Service: 12/15/2018 11:15 AM Medical Record Number: [id number] Patient Account Number: [id number] Date of Birth/Sex: November 07, 1920 (85 y.o. F) Treating RN: Montey Hora Primary Care Layden Caterino: Ria Bush Other Clinician: Referring Angelisa Winthrop: Ria Bush Treating Christropher Gintz/Extender: STONE III,  HOYT Weeks in Treatment: 0 Clinic Level of Care Assessment Items TOOL 1 Quantity Score []  - Use when EandM and Procedure is performed on INITIAL visit 0 ASSESSMENTS - Nursing Assessment / Reassessment X - General Physical Exam (combine w/ comprehensive assessment (listed just below) when 1 20 performed on new pt. evals) X- 1 25 Comprehensive Assessment (HX, ROS, Risk Assessments, Wounds Hx, etc.) ASSESSMENTS - Wound and Skin Assessment / Reassessment []  - Dermatologic / Skin Assessment (not related to wound area) 0 ASSESSMENTS - Ostomy and/or Continence Assessment and Care []  - Incontinence Assessment and Management 0 []  - 0 Ostomy Care Assessment and Management (repouching, etc.) PROCESS - Coordination of Care X - Simple Patient / Family Education for ongoing care 1 15 []  - 0 Complex (extensive) Patient / Family Education for ongoing care X- 1 10 Staff obtains Programmer, systems, Records, Test Results / Process Orders []  - 0 Staff telephones HHA, Nursing Homes / Clarify orders / etc []  - 0 Routine Transfer to another Facility (non-emergent condition) []  - 0 Routine Hospital Admission (non-emergent condition) X- 1 15 New Admissions / Biomedical engineer / Ordering NPWT, Apligraf, etc. []  - 0 Emergency Hospital Admission (emergent condition) PROCESS - Special Needs []  - Pediatric / Minor Patient Management 0 []  - 0 Isolation Patient Management []  - 0 Hearing / Language / Visual special needs []  - 0 Assessment of Community assistance (transportation, D/C planning, etc.) []  - 0 Additional assistance / Altered mentation []  - 0 Support Surface(s) Assessment (bed, cushion, seat, etc.) [name], Traci W. ([id number]) INTERVENTIONS - Miscellaneous []  - External ear exam 0 []  - 0 Patient Transfer (multiple staff / Civil Service fast streamer / Similar devices) []  - 0 Simple Staple / Suture removal (25 or less) []  - 0 Complex Staple / Suture removal (26 or more) []  - 0 Hypo/Hyperglycemic  Management (do not check if billed separately) X- 1 15 Ankle / Brachial Index (ABI) - do not check if billed separately Has the patient been seen at the hospital within the last three years: Yes Total Score: 100 Level Of Care: New/Established - Level 3 Electronic Signature(s) Signed: 12/15/2018 1:21:18 PM  By: Montey Hora Entered By: Montey Hora on 12/15/2018 12:12:29 [name] ([id number]) -------------------------------------------------------------------------------- Encounter Discharge Information Details Patient Name: [name] Date of Service: 12/15/2018 11:15 AM Medical Record Number: [id number] Patient Account Number: [id number] Date of Birth/Sex: 07-Oct-1920 (85 y.o. F) Treating RN: Montey Hora Primary Care Ivyanna Sibert: Ria Bush Other Clinician: Referring Fin Hupp: Ria Bush Treating Amarrah Meinhart/Extender: Melburn Hake, HOYT Weeks in Treatment: 0 Encounter Discharge Information Items Post Procedure Vitals Discharge Condition: Stable Temperature (F): 98.1 Ambulatory Status: Walker Pulse (bpm): 78 Discharge Destination: Home Respiratory Rate (breaths/min): 16 Transportation: Private Auto Blood Pressure (mmHg): 122/78 Accompanied By: caregiver Schedule Follow-up Appointment: Yes Clinical Summary of Care: Electronic Signature(s) Signed: 12/15/2018 1:21:18 PM By: Montey Hora Entered By: Montey Hora on 12/15/2018 12:13:50 [name], [name] ([id number]) -------------------------------------------------------------------------------- Lower Extremity Assessment Details Patient Name: [name] Date of Service: 12/15/2018 11:15 AM Medical Record Number: [id number] Patient Account Number: [id number] Date of Birth/Sex: Feb 22, 1920 (85 y.o. F) Treating RN: Army Melia Primary Care Jaymien Landin: Ria Bush Other Clinician: Referring Tysheena Ginzburg: Ria Bush Treating Krina Mraz/Extender: Melburn Hake, HOYT Weeks in Treatment: 0 Edema  Assessment Assessed: [Left: No] [Right: No] Edema: [Left: Ye] [Right: s] Calf Left: Right: Point of Measurement: 30 cm From Medial Instep cm 33 cm Ankle Left: Right: Point of Measurement: 10 cm From Medial Instep cm 20 cm Vascular Assessment Pulses: Dorsalis Pedis Palpable: [Right:Yes] Posterior Tibial Palpable: [Right:Yes] Notes Unable to obtain ABI d/t pain in right leg Electronic Signature(s) Signed: 12/18/2018 5:38:10 PM By: Army Melia Entered By: Army Melia on 12/15/2018 11:34:54 Yamin, [name] ([id number]) -------------------------------------------------------------------------------- Multi Wound Chart Details Patient Name: [name] Date of Service: 12/15/2018 11:15 AM Medical Record Number: [id number] Patient Account Number: [id number] Date of Birth/Sex: 20-Aug-1920 (85 y.o. F) Treating RN: Montey Hora Primary Care Lavanda Nevels: Ria Bush Other Clinician: Referring Sheika Coutts: Ria Bush Treating Navina Wohlers/Extender: STONE III, HOYT Weeks in Treatment: 0 Vital Signs Height(in): 63 Pulse(bpm): 78 Weight(lbs): 113 Blood Pressure(mmHg): 122/78 Body Mass Index(BMI): 20 Temperature(F): 98.1 Respiratory Rate 16 (breaths/min): Photos: Wound Location: Left Upper Arm Left Forearm - Lateral Right Lower Leg - Posterior Wounding Event: Trauma Trauma Trauma Primary Etiology: Skin Tear Skin Tear Skin Tear Date Acquired: 12/09/2018 12/09/2018 12/09/2018 Weeks of Treatment: 0 0 0 Wound Status: Open Open Open Measurements L x W x D 1x0.5x0.1 0.5x0.5x0.1 11x5x0.1 (cm) Area (cm) : 0.393 0.196 43.197 Volume (cm) : 0.039 0.02 4.32 % Reduction in Area: 0.00% 0.00% 0.00% % Reduction in Volume: 0.00% 0.00% 0.00% Classification: Full Thickness Without Full Thickness Without Full Thickness Without Exposed Support Structures Exposed Support Structures Exposed Support Structures Exudate Amount: Medium Medium Medium Exudate Type: Serosanguineous  Sanguinous Sanguinous Exudate Color: red, brown red red Wound Margin: Flat and Intact Flat and Intact Flat and Intact Granulation Amount: Small (1-33%) Small (1-33%) Small (1-33%) Granulation Quality: Red Red Red Necrotic Amount: Large (67-100%) Medium (34-66%) Large (67-100%) Necrotic Tissue: Adherent [place] Exposed Structures: Fat Layer (Subcutaneous Fat Layer (Subcutaneous Fat Layer (Subcutaneous Tissue) Exposed: Yes Tissue) Exposed: Yes Tissue) Exposed: Yes Fascia: No Fascia: No Fascia: No Tendon: No Tendon: No Tendon: No Muscle: No Muscle: No Muscle: No Joint: No Joint: No Joint: No Bone: No Bone: No Bone: No [name], Traci W. ([id number]) Epithelialization: None None None Treatment Notes Electronic Signature(s) Signed: 12/15/2018 1:21:18 PM By: Montey Hora Entered By: Montey Hora on 12/15/2018 12:00:42 [name], [name] ([id number]) -------------------------------------------------------------------------------- [place] Details Patient Name: [name] Date of Service: 12/15/2018 11:15 AM Medical  Record Number: [id number] Patient Account Number: [id number] Date of Birth/Sex: June 30, 1920 (85 y.o. F) Treating RN: Montey Hora Primary Care Adaja Wander: Ria Bush Other Clinician: Referring Weslyn Holsonback: Ria Bush Treating Zahniya Zellars/Extender: Melburn Hake, HOYT Weeks in Treatment: 0 Active Inactive Abuse / Safety / Falls / Self Care Management Nursing Diagnoses: Potential for falls Goals: Patient will not experience any injury related to falls Date Initiated: 12/15/2018 Target Resolution Date: 03/03/2019 Goal Status: Active Interventions: Assess fall risk on admission and as needed Notes: Orientation to the Wound Care Program Nursing Diagnoses: Knowledge deficit related to the wound healing center program Goals: Patient/caregiver will verbalize understanding of the [facility] Date Initiated: 12/15/2018 Target Resolution Date: 03/03/2019 Goal Status: Active Interventions: Provide education on orientation to the wound center Notes: Wound/Skin Impairment Nursing Diagnoses: Impaired tissue integrity Goals: Ulcer/skin breakdown will heal within 14 weeks Date Initiated: 12/15/2018 Target Resolution Date: 03/03/2019 Goal Status: Active Interventions: Assess patient/caregiver ability to obtain necessary supplies Dutson, Traci W. ([id number]) Assess patient/caregiver ability to perform ulcer/skin care regimen upon admission and as needed Assess ulceration(s) every visit Notes: Electronic Signature(s) Signed: 12/15/2018 1:21:18 PM By: Montey Hora Entered By: Montey Hora on 12/15/2018 11:59:27 [name], [name] ([id number]) -------------------------------------------------------------------------------- Pain Assessment Details Patient Name: [name] Date of Service: 12/15/2018 11:15 AM Medical Record Number: [id number] Patient Account Number: [id number] Date of Birth/Sex: December 28, 1920 (85 y.o. F) Treating RN: Army Melia Primary Care Annely Sliva: Ria Bush Other Clinician: Referring Laila Myhre: Ria Bush Treating Zygmund Passero/Extender: Melburn Hake, HOYT Weeks in Treatment: 0 Active Problems Location of Pain Severity and Description of Pain Patient Has Paino No Site Locations Pain Management and Medication Current Pain Management: Electronic Signature(s) Signed: 12/18/2018 5:38:10 PM By: Army Melia Entered By: Army Melia on 12/15/2018 11:23:39 [name], [name] ([id number]) -------------------------------------------------------------------------------- Patient/Caregiver Education Details Patient Name: [name] Date of Service: 12/15/2018 11:15 AM Medical Record Number: [id number] Patient Account Number: [id number] Date of Birth/Gender: September 06, 1920 (85 y.o. F) Treating RN: Montey Hora Primary Care  Physician: Ria Bush Other Clinician: Referring Physician: Ria Bush Treating Physician/Extender: Sharalyn Ink in Treatment: 0 Education Assessment Education Provided To: Patient and Caregiver Education Topics Provided Wound/Skin Impairment: Handouts: Other: wound care as ordered Methods: Demonstration, Explain/Verbal Responses: State content correctly Electronic Signature(s) Signed: 12/15/2018 1:21:18 PM By: Montey Hora Entered By: Montey Hora on 12/15/2018 12:12:46 [name], [name] ([id number]) -------------------------------------------------------------------------------- Wound Assessment Details Patient Name: [name] Date of Service: 12/15/2018 11:15 AM Medical Record Number: [id number] Patient Account Number: [id number] Date of Birth/Sex: 20-May-1920 (85 y.o. F) Treating RN: Army Melia Primary Care Dezeray Puccio: Ria Bush Other Clinician: Referring Annelies Coyt: Ria Bush Treating Breelle Hollywood/Extender: STONE III, HOYT Weeks in Treatment: 0 Wound Status Wound Number: 1 Primary Etiology: Skin Tear Wound Location: Left Upper Arm Wound Status: Open Wounding Event: Trauma Date Acquired: 12/09/2018 Weeks Of Treatment: 0 Clustered Wound: No Photos Wound Measurements Length: (cm) 1 Width: (cm) 0.5 Depth: (cm) 0.1 Area: (cm) 0.393 Volume: (cm) 0.039 % Reduction in Area: 0% % Reduction in Volume: 0% Epithelialization: None Tunneling: No Undermining: No Wound Description Full Thickness Without Exposed Support Classification: Structures Wound Margin: Flat and Intact Exudate Medium Amount: Exudate Type: Serosanguineous Exudate Color: red, brown Foul Odor After Cleansing: No Slough/Fibrino Yes Wound Bed Granulation Amount: Small (1-33%) Exposed Structure Granulation Quality: Red Fascia Exposed: No Necrotic Amount: Large (67-100%) Fat Layer (Subcutaneous Tissue) Exposed: Yes Necrotic Quality: Adherent  Slough Tendon Exposed: No Muscle Exposed: No Joint Exposed: No Bone Exposed: No [name], Emaree W. ([id number])  Electronic Signature(s) Signed: 12/15/2018 1:21:18 PM By: Montey Hora Signed: 12/18/2018 5:38:10 PM By: Army Melia Previous Signature: 12/15/2018 11:55:48 AM Version By: Harold Barban Entered By: Montey Hora on 12/15/2018 12:00:01 Nibert, [name] ([id number]) -------------------------------------------------------------------------------- Wound Assessment Details Patient Name: [name] Date of Service: 12/15/2018 11:15 AM Medical Record Number: [id number] Patient Account Number: [id number] Date of Birth/Sex: 01/29/1920 (85 y.o. F) Treating RN: Army Melia Primary Care Adrine Hayworth: Ria Bush Other Clinician: Referring Aristidis Talerico: Ria Bush Treating Kypton Eltringham/Extender: STONE III, HOYT Weeks in Treatment: 0 Wound Status Wound Number: 2 Primary Etiology: Skin Tear Wound Location: Left Forearm - Lateral Wound Status: Open Wounding Event: Trauma Date Acquired: 12/09/2018 Weeks Of Treatment: 0 Clustered Wound: No Photos Wound Measurements Length: (cm) 0.5 Width: (cm) 0.5 Depth: (cm) 0.1 Area: (cm) 0.196 Volume: (cm) 0.02 % Reduction in Area: 0% % Reduction in Volume: 0% Epithelialization: None Tunneling: No Undermining: No Wound Description Full Thickness Without Exposed Support Classification: Structures Wound Margin: Flat and Intact Exudate Medium Amount: Exudate Type: Sanguinous Exudate Color: red Foul Odor After Cleansing: No Slough/Fibrino Yes Wound Bed Granulation Amount: Small (1-33%) Exposed Structure Granulation Quality: Red Fascia Exposed: No Necrotic Amount: Medium (34-66%) Fat Layer (Subcutaneous Tissue) Exposed: Yes Necrotic Quality: Adherent Slough Tendon Exposed: No Muscle Exposed: No Joint Exposed: No Bone Exposed: No Ouellet, Diksha W. ([id number]) Electronic Signature(s) Signed: 12/15/2018 1:21:18 PM  By: Montey Hora Signed: 12/18/2018 5:38:10 PM By: Army Melia Previous Signature: 12/15/2018 11:55:48 AM Version By: Harold Barban Entered By: Montey Hora on 12/15/2018 12:00:20 Nygaard, [name] ([id number]) -------------------------------------------------------------------------------- Wound Assessment Details Patient Name: [name] Date of Service: 12/15/2018 11:15 AM Medical Record Number: [id number] Patient Account Number: [id number] Date of Birth/Sex: 1920-08-30 (85 y.o. F) Treating RN: Army Melia Primary Care Maxen Rowland: Ria Bush Other Clinician: Referring Bee Hammerschmidt: Ria Bush Treating Franchesca Veneziano/Extender: STONE III, HOYT Weeks in Treatment: 0 Wound Status Wound Number: 3 Primary Etiology: Skin Tear Wound Location: Right Lower Leg - Posterior Wound Status: Open Wounding Event: Trauma Date Acquired: 12/09/2018 Weeks Of Treatment: 0 Clustered Wound: No Photos Wound Measurements Length: (cm) 11 Width: (cm) 5 Depth: (cm) 0.1 Area: (cm) 43.197 Volume: (cm) 4.32 % Reduction in Area: 0% % Reduction in Volume: 0% Epithelialization: None Tunneling: No Undermining: No Wound Description Full Thickness Without Exposed Support Classification: Structures Wound Margin: Flat and Intact Exudate Medium Amount: Exudate Type: Sanguinous Exudate Color: red Foul Odor After Cleansing: No Slough/Fibrino Yes Wound Bed Granulation Amount: Small (1-33%) Exposed Structure Granulation Quality: Red Fascia Exposed: No Necrotic Amount: Large (67-100%) Fat Layer (Subcutaneous Tissue) Exposed: Yes Necrotic Quality: Eschar, Adherent Slough Tendon Exposed: No Muscle Exposed: No Joint Exposed: No Bone Exposed: No Battin, Malia W. ([id number]) Electronic Signature(s) Signed: 12/15/2018 1:21:18 PM By: Montey Hora Signed: 12/18/2018 5:38:10 PM By: Army Melia Previous Signature: 12/15/2018 11:55:48 AM Version By: Harold Barban Entered By: Montey Hora on 12/15/2018 12:00:33 Franko, [name] ([id number]) -------------------------------------------------------------------------------- Vitals Details Patient Name: [name] Date of Service: 12/15/2018 11:15 AM Medical Record Number: [id number] Patient Account Number: [id number] Date of Birth/Sex: 1920/05/10 (85 y.o. F) Treating RN: Army Melia Primary Care Carmelo Reidel: Ria Bush Other Clinician: Referring Nasirah Sachs: Ria Bush Treating Tabetha Haraway/Extender: STONE III, HOYT Weeks in Treatment: 0 Vital Signs Time Taken: 11:23 Temperature (F): 98.1 Height (in): 63 Pulse (bpm): 78 Source: Stated Respiratory Rate (breaths/min): 16 Weight (lbs): 113 Blood Pressure (mmHg): 122/78 Source: Stated Reference Range: 80 - 120 mg / dl Body Mass Index (BMI): 20 Electronic Signature(s) Signed: 12/18/2018 5:38:10 PM By:  Nicki Reaper, Dajea Entered By: Army Melia on 12/15/2018 11:24:06

## 2019-01-03 NOTE — Progress Notes (Signed)
CHARISA, MUES ([id number]) Visit Report for 12/15/2018 Chief Complaint Document Details Patient Name: [name], [name] Date of Service: 12/15/2018 11:15 AM Medical Record Number: [id number] Patient Account Number: [id number] Date of Birth/Sex: 03/09/1920 (85 y.o. F) Treating RN: Montey Hora Primary Care Provider: Ria Bush Other Clinician: Referring Provider: Ria Bush Treating Provider/Extender: Melburn Hake, HOYT Weeks in Treatment: 0 Information Obtained from: Patient Chief Complaint Left arm, left forearm, and right LE skin tears Electronic Signature(s) Signed: 12/15/2018 11:51:41 AM By: Worthy Keeler PA-C Previous Signature: 12/15/2018 11:51:14 AM Version By: Worthy Keeler PA-C Entered By: Worthy Keeler on 12/15/2018 11:51:41 [name], [name] ([id number]) -------------------------------------------------------------------------------- Debridement Details Patient Name: [name] Date of Service: 12/15/2018 11:15 AM Medical Record Number: [id number] Patient Account Number: [id number] Date of Birth/Sex: 18-Mar-1920 (85 y.o. F) Treating RN: Montey Hora Primary Care Provider: Ria Bush Other Clinician: Referring Provider: Ria Bush Treating Provider/Extender: Melburn Hake, HOYT Weeks in Treatment: 0 Debridement Performed for Wound #1 Left Upper Arm Assessment: Performed By: Physician STONE III, HOYT E., PA-C Debridement Type: Chemical/Enzymatic/Mechanical Agent Used: Saline Level of Consciousness (Pre- Awake and Alert procedure): Pre-procedure Verification/Time Yes - 12:00 Out Taken: Start Time: 12:00 Pain Control: Lidocaine 4% Topical Solution Instrument: Other : saline and gauze Bleeding: Minimum Hemostasis Achieved: Pressure End Time: 12:02 Procedural Pain: 0 Post Procedural Pain: 0 Response to Treatment: Procedure was tolerated well Level of Consciousness Awake and Alert (Post-procedure): Post Debridement  Measurements of Total Wound Length: (cm) 1 Width: (cm) 0.5 Depth: (cm) 0.1 Volume: (cm) 0.039 Character of Wound/Ulcer Post Debridement: Improved Post Procedure Diagnosis Same as Pre-procedure Electronic Signature(s) Signed: 12/15/2018 1:21:18 PM By: Montey Hora Signed: 12/15/2018 1:53:04 PM By: Worthy Keeler PA-C Entered By: Montey Hora on 12/15/2018 12:01:24 [name] ([id number]) -------------------------------------------------------------------------------- Debridement Details Patient Name: [name] Date of Service: 12/15/2018 11:15 AM Medical Record Number: [id number] Patient Account Number: [id number] Date of Birth/Sex: 1920-10-29 (85 y.o. F) Treating RN: Montey Hora Primary Care Provider: Ria Bush Other Clinician: Referring Provider: Ria Bush Treating Provider/Extender: Melburn Hake, HOYT Weeks in Treatment: 0 Debridement Performed for Wound #2 Left,Lateral Forearm Assessment: Performed By: Physician STONE III, HOYT E., PA-C Debridement Type: Chemical/Enzymatic/Mechanical Agent Used: Saline Level of Consciousness (Pre- Awake and Alert procedure): Pre-procedure Verification/Time Yes - 12:02 Out Taken: Start Time: 12:02 Pain Control: Lidocaine 4% Topical Solution Instrument: Other : saline and gauze Bleeding: Minimum Hemostasis Achieved: Pressure End Time: 12:04 Procedural Pain: 0 Post Procedural Pain: 0 Response to Treatment: Procedure was tolerated well Level of Consciousness Awake and Alert (Post-procedure): Post Debridement Measurements of Total Wound Length: (cm) 0.5 Width: (cm) 0.5 Depth: (cm) 0.1 Volume: (cm) 0.02 Character of Wound/Ulcer Post Debridement: Improved Post Procedure Diagnosis Same as Pre-procedure Electronic Signature(s) Signed: 12/15/2018 1:21:18 PM By: Montey Hora Signed: 12/15/2018 1:53:04 PM By: Worthy Keeler PA-C Entered By: Montey Hora on 12/15/2018 12:01:49 [name], [name] ([id number]) -------------------------------------------------------------------------------- Debridement Details Patient Name: [name] Date of Service: 12/15/2018 11:15 AM Medical Record Number: [id number] Patient Account Number: [id number] Date of Birth/Sex: 1920-10-23 (85 y.o. F) Treating RN: Montey Hora Primary Care Provider: Ria Bush Other Clinician: Referring Provider: Ria Bush Treating Provider/Extender: Melburn Hake, HOYT Weeks in Treatment: 0 Debridement Performed for Wound #3 Right,Posterior Lower Leg Assessment: Performed By: Physician STONE III, HOYT E., PA-C Debridement Type: Debridement Level of Consciousness (Pre- Awake and Alert procedure): Pre-procedure Verification/Time Yes - 12:04 Out Taken: Start Time: 12:04 Pain Control: Lidocaine 4% Topical Solution Total Area Debrided (  L x W): 11 (cm) x 5 (cm) = 55 (cm) Tissue and other material Viable, Non-Viable, Subcutaneous, Skin: Dermis , Skin: Epidermis debrided: Level: Skin/Subcutaneous Tissue Debridement Description: Excisional Instrument: Forceps, Scissors Bleeding: Minimum Hemostasis Achieved: Pressure End Time: 12:08 Procedural Pain: 0 Post Procedural Pain: 0 Response to Treatment: Procedure was tolerated well Level of Consciousness Awake and Alert (Post-procedure): Post Debridement Measurements of Total Wound Length: (cm) 11 Width: (cm) 5 Depth: (cm) 0.2 Volume: (cm) 8.639 Character of Wound/Ulcer Post Debridement: Improved Post Procedure Diagnosis Same as Pre-procedure Electronic Signature(s) Signed: 12/15/2018 1:21:18 PM By: Montey Hora Signed: 12/15/2018 1:53:04 PM By: Worthy Keeler PA-C Entered By: Montey Hora on 12/15/2018 12:09:06 [name], [name] ([id number]) -------------------------------------------------------------------------------- HPI Details Patient Name: [name] Date of Service: 12/15/2018 11:15 AM Medical Record Number:  [id number] Patient Account Number: [id number] Date of Birth/Sex: 01/28/1920 (85 y.o. F) Treating RN: Montey Hora Primary Care Provider: Ria Bush Other Clinician: Referring Provider: Ria Bush Treating Provider/Extender: Melburn Hake, HOYT Weeks in Treatment: 0 History of Present Illness HPI Description: 12/15/2018 on evaluation today patient presents for initial evaluation here in our clinic concerning issues that she has been having at multiple locations with skin tears. She had a fall roughly 2 weeks ago where she injured her left upper arm and forearm at that time. Subsequently she ended up as well with a skin tear to her leg when her family members were trying to get her into the bed her skin is so fragile that as she was lifting her legs the skin on the back of the leg tore. That is on the right. Nonetheless they have been trying to manage these I did see her primary care provider earlier in the week who referred her to [name] for further evaluation and treatment. Currently there does not appear to be any signs of active infection at this time which is good news at any site. No fevers, chills, nausea, vomiting, or diarrhea. She does have pain but this is mainly on the leg ulcer the arms are really not to painful at this point. The patient does have a history of dementia, hypertension, congestive heart failure, and chronic kidney disease stage III. Electronic Signature(s) Signed: 12/15/2018 12:50:55 PM By: Worthy Keeler PA-C Entered By: Worthy Keeler on 12/15/2018 12:50:55 [name], [name] ([id number]) -------------------------------------------------------------------------------- Physical Exam Details Patient Name: [name] Date of Service: 12/15/2018 11:15 AM Medical Record Number: [id number] Patient Account Number: [id number] Date of Birth/Sex: November 30, 1920 (85 y.o. F) Treating RN: Montey Hora Primary Care Provider: Ria Bush Other  Clinician: Referring Provider: Ria Bush Treating Provider/Extender: STONE III, HOYT Weeks in Treatment: 0 Constitutional sitting or standing blood pressure is within target range for patient.. pulse regular and within target range for patient.Marland Kitchen respirations regular, non-labored and within target range for patient.Marland Kitchen temperature within target range for patient.. Well- nourished and well-hydrated in no acute distress. Eyes conjunctiva clear no eyelid edema noted. pupils equal round and reactive to light and accommodation. Ears, Nose, Mouth, and Throat no gross abnormality of ear auricles or external auditory canals. normal hearing noted during conversation. mucus membranes moist. Respiratory normal breathing without difficulty. clear to auscultation bilaterally. Cardiovascular regular rate and rhythm with normal S1, S2. no clubbing, cyanosis, significant edema, <3 sec cap refill. Gastrointestinal (GI) soft, non-tender, non-distended, +BS. no ventral hernia noted. Musculoskeletal Patient unable to walk without assistance. no significant deformity or arthritic changes, no loss or range of motion, no clubbing. Psychiatric Patient is not able to  cooperate in decision making regarding care. Patient has dementia. patient is confused. Notes Upon inspection today patient's wound bed actually showed signs of having some viable skin that I felt like I could pull down over the left upper arm ulcer. I was able to do this and then subsequently used 3 Steri-Strips to tack this down I think you actually heal quite nicely based on what I am seeing at this point. Subsequently in regard to the forearm ulcer that was not an area that I was able to pull the skin over in order to help at this point. In fact there was some necrotic tissue in the surface of the wound which I was able to mechanically debride away with saline gauze patient tolerated this today without complication. With regard to her  right leg ulcer actually had to perform sharp debridement to clear away some of the necrotic tissue which was actually folded back on itself in order to allow for appropriate healing. Patient tolerated this with minimal discomfort at times only when the skin was being separated from where it had gotten stuck to the base of the wound folded under. The actual trimming did not really cause her any pain and post debridement wound bed appears to be doing better I am hopeful the remaining skin that is intact will actually readhere at this point. Electronic Signature(s) Signed: 12/15/2018 12:53:09 PM By: Worthy Keeler PA-C Entered By: Worthy Keeler on 12/15/2018 12:53:09 [name], [name] ([id number]) -------------------------------------------------------------------------------- Physician Orders Details Patient Name: [name] Date of Service: 12/15/2018 11:15 AM Medical Record Number: [id number] Patient Account Number: [id number] Date of Birth/Sex: [birth year] (85 y.o. F) Treating RN: Montey Hora Primary Care Provider: Ria Bush Other Clinician: Referring Provider: Ria Bush Treating Provider/Extender: Melburn Hake, HOYT Weeks in Treatment: 0 Verbal / Phone Orders: No Diagnosis Coding ICD-10 Coding Code Description S41.102A Unspecified open wound of left upper arm, initial encounter S51.802A Unspecified open wound of left forearm, initial encounter S81.801A Unspecified open wound, right lower leg, initial encounter F03.90 Unspecified dementia without behavioral disturbance I10 Essential (primary) hypertension 99991111 Chronic diastolic (congestive) heart failure N18.30 Chronic kidney disease, stage 3 unspecified Wound Cleansing Wound #1 Left Upper Arm o Clean wound with Normal Saline. o Cleanse wound with mild soap and water o May Shower, gently pat wound dry prior to applying new dressing. Wound #2 Left,Lateral Forearm o Clean wound with Normal  Saline. o Cleanse wound with mild soap and water o May Shower, gently pat wound dry prior to applying new dressing. Wound #3 Right,Posterior Lower Leg o Clean wound with Normal Saline. o Cleanse wound with mild soap and water o May Shower, gently pat wound dry prior to applying new dressing. Primary Wound Dressing Wound #1 Left Upper Arm o Xeroform Wound #2 Left,Lateral Forearm o Xeroform Wound #3 Right,Posterior Lower Leg o Silver Alginate - oil emulsion contact layer or adaptic to wound bed Secondary Dressing Wound #1 Left Upper Arm o ABD and Kerlix/Conform - secure with netting Wound #2 Left,Lateral Forearm o ABD and Kerlix/Conform - secure with netting Fulginiti, Tamaya W. ([id number]) Wound #3 Right,Posterior Lower Leg o ABD and Kerlix/Conform - secure with netting Dressing Change Frequency Wound #1 Left Upper Arm o Change dressing every other day. Wound #2 Left,Lateral Forearm o Change dressing every other day. Wound #3 Right,Posterior Lower Leg o Change dressing every other day. Follow-up Appointments o Return Appointment in 2 weeks. Home Health Wound #1 Left Upper Arm o Initiate Home Health for Skilled Nursing   o Home Health Nurse may visit PRN to address patientos wound care needs. o FACE TO FACE ENCOUNTER: MEDICARE and MEDICAID PATIENTS: I certify that this patient is under my care and that I had a face-to-face encounter that meets the physician face-to-face encounter requirements with this patient on this date. The encounter with the patient was in whole or in part for the following MEDICAL CONDITION: (primary reason for [place]) MEDICAL NECESSITY: I certify, that based on my findings, NURSING services are a medically necessary home health service. HOME BOUND STATUS: I certify that my clinical findings support that this patient is homebound (i.e., Due to illness or injury, pt requires aid of supportive devices such as  crutches, cane, wheelchairs, walkers, the use of special transportation or the assistance of another person to leave their place of residence. There is a normal inability to leave the home and doing so requires considerable and taxing effort. Other absences are for medical reasons / religious services and are infrequent or of short duration when for other reasons). o If current dressing causes regression in wound condition, may D/C ordered dressing product/s and apply Normal Saline Moist Dressing daily until next [place] / Other MD appointment. [place] of regression in wound condition at [phone]. o Please direct any NON-WOUND related issues/requests for orders to patient's Primary Care Physician Wound #2 Left,Lateral Forearm o [place] for [place] Nurse may visit PRN to address patientos wound care needs. o FACE TO FACE ENCOUNTER: MEDICARE and MEDICAID PATIENTS: I certify that this patient is under my care and that I had a face-to-face encounter that meets the physician face-to-face encounter requirements with this patient on this date. The encounter with the patient was in whole or in part for the following MEDICAL CONDITION: (primary reason for [place]) MEDICAL NECESSITY: I certify, that based on my findings, NURSING services are a medically necessary home health service. HOME BOUND STATUS: I certify that my clinical findings support that this patient is homebound (i.e., Due to illness or injury, pt requires aid of supportive devices such as crutches, cane, wheelchairs, walkers, the use of special transportation or the assistance of another person to leave their place of residence. There is a normal inability to leave the home and doing so requires considerable and taxing effort. Other absences are for medical reasons / religious services and are infrequent or of short duration when for other  reasons). o If current dressing causes regression in wound condition, may D/C ordered dressing product/s and apply Normal Saline Moist Dressing daily until next [place] / Other MD appointment. [place] of regression in wound condition at [phone]. o Please direct any NON-WOUND related issues/requests for orders to patient's Primary Care Physician Wound #3 [place] for [place] Nurse may visit PRN to address patientos wound care needs. FUMI, RAMISCAL ([id number]) o FACE TO FACE ENCOUNTER: MEDICARE and MEDICAID PATIENTS: I certify that this patient is under my care and that I had a face-to-face encounter that meets the physician face-to-face encounter requirements with this patient on this date. The encounter with the patient was in whole or in part for the following MEDICAL CONDITION: (primary reason for [place]) MEDICAL NECESSITY: I certify, that based on my findings, NURSING services are a medically necessary home health service. HOME BOUND STATUS: I certify that my clinical findings support that this patient is homebound (i.e., Due  to illness or injury, pt requires aid of supportive devices such as crutches, cane, wheelchairs, walkers, the use of special transportation or the assistance of another person to leave their place of residence. There is a normal inability to leave the home and doing so requires considerable and taxing effort. Other absences are for medical reasons / religious services and are infrequent or of short duration when for other reasons). o If current dressing causes regression in wound condition, may D/C ordered dressing product/s and apply Normal Saline Moist Dressing daily until next [place] / Other MD appointment. [place] of regression in wound condition at [phone]. o Please direct any NON-WOUND related  issues/requests for orders to patient's Primary Care Physician Electronic Signature(s) Signed: 12/15/2018 1:21:18 PM By: Montey Hora Signed: 12/15/2018 1:53:04 PM By: Worthy Keeler PA-C Entered By: Montey Hora on 12/15/2018 12:14:40 [name], [name] ([id number]) -------------------------------------------------------------------------------- Problem List Details Patient Name: [name] Date of Service: 12/15/2018 11:15 AM Medical Record Number: [id number] Patient Account Number: [id number] Date of Birth/Sex: [birth year] (85 y.o. F) Treating RN: Montey Hora Primary Care Provider: Ria Bush Other Clinician: Referring Provider: Ria Bush Treating Provider/Extender: Melburn Hake, HOYT Weeks in Treatment: 0 Active Problems ICD-10 Evaluated Encounter Code Description Active Date Today Diagnosis S41.102A Unspecified open wound of left upper arm, initial encounter 12/15/2018 No Yes S51.802A Unspecified open wound of left forearm, initial encounter 12/15/2018 No Yes S81.801A Unspecified open wound, right lower leg, initial encounter 12/15/2018 No Yes F03.90 Unspecified dementia without behavioral disturbance 12/15/2018 No Yes I10 Essential (primary) hypertension 12/15/2018 No Yes 99991111 Chronic diastolic (congestive) heart failure 12/15/2018 No Yes N18.30 Chronic kidney disease, stage 3 unspecified 12/15/2018 No Yes Inactive Problems Resolved Problems Electronic Signature(s) Signed: 12/15/2018 11:50:48 AM By: Worthy Keeler PA-C Entered By: Worthy Keeler on 12/15/2018 11:50:48 Mazzeo, [name] ([id number]) -------------------------------------------------------------------------------- Progress Note Details Patient Name: [name] Date of Service: 12/15/2018 11:15 AM Medical Record Number: [id number] Patient Account Number: [id number] Date of Birth/Sex: [birth year] (85 y.o. F) Treating RN: Montey Hora Primary Care Provider: Ria Bush  Other Clinician: Referring Provider: Ria Bush Treating Provider/Extender: Melburn Hake, HOYT Weeks in Treatment: 0 Subjective Chief Complaint Information obtained from Patient Left arm, left forearm, and right LE skin tears History of Present Illness (HPI) 12/15/2018 on evaluation today patient presents for initial evaluation here in our clinic concerning issues that she has been having at multiple locations with skin tears. She had a fall roughly 2 weeks ago where she injured her left upper arm and forearm at that time. Subsequently she ended up as well with a skin tear to her leg when her family members were trying to get her into the bed her skin is so fragile that as she was lifting her legs the skin on the back of the leg tore. That is on the right. Nonetheless they have been trying to manage these I did see her primary care provider earlier in the week who referred her to [name] for further evaluation and treatment. Currently there does not appear to be any signs of active infection at this time which is good news at any site. No fevers, chills, nausea, vomiting, or diarrhea. She does have pain but this is mainly on the leg ulcer the arms are really not to painful at this point. The patient does have a history of dementia, hypertension, congestive heart failure, and chronic kidney disease stage III. Patient History Information obtained from Patient. Allergies No Known Allergies  Family History Diabetes - Child, Hypertension - Child, Lung Disease - Child, No family history of Cancer, Heart Disease, Hereditary Spherocytosis, Kidney Disease, Seizures, Stroke, Thyroid Problems, Tuberculosis. Social History Never smoker, Marital Status - Widowed, Alcohol Use - Never, Drug Use - No History, Caffeine Use - Rarely. Medical History Eyes Denies history of Cataracts, Glaucoma Ear/Nose/Mouth/Throat Denies history of Chronic sinus problems/congestion, Middle ear  problems Hematologic/Lymphatic Denies history of Anemia, Hemophilia, Human Immunodeficiency Virus, Lymphedema, Sickle Cell Disease Respiratory Denies history of Aspiration, Asthma, Chronic Obstructive Pulmonary Disease (COPD), Pneumothorax, Sleep Apnea, Tuberculosis Cardiovascular Denies history of Angina, Arrhythmia, Congestive Heart Failure, Coronary Artery Disease, Deep Vein Thrombosis, Hypertension, Hypotension, Myocardial Infarction, Peripheral Arterial Disease, Peripheral Venous Disease, Phlebitis, Vasculitis Gastrointestinal Saz[place], Laynee W. ([id number]) Denies history of Cirrhosis , Colitis, Crohn s, Hepatitis A, Hepatitis B, Hepatitis C Endocrine Denies history of Type I Diabetes, Type II Diabetes Genitourinary Denies history of End Stage Renal Disease Immunological Denies history of Lupus Erythematosus, Raynaud s, Scleroderma Integumentary (Skin) Denies history of History of Burn, History of pressure wounds Musculoskeletal Denies history of Gout, Rheumatoid Arthritis, Osteoarthritis, Osteomyelitis Neurologic Denies history of Dementia, Neuropathy, Quadriplegia, Paraplegia, Seizure Disorder Oncologic Denies history of Received Chemotherapy, Received Radiation Psychiatric Denies history of Anorexia/bulimia, Confinement Anxiety Review of Systems (ROS) Constitutional Symptoms (General Health) Denies complaints or symptoms of Fatigue, Fever, Chills, Marked Weight Change. Eyes Complains or has symptoms of Glasses / Contacts. Denies complaints or symptoms of Dry Eyes, Vision Changes. Ear/Nose/Mouth/Throat Denies complaints or symptoms of Difficult clearing ears, Sinusitis. Hematologic/Lymphatic Denies complaints or symptoms of Bleeding / Clotting Disorders, Human Immunodeficiency Virus. Respiratory Denies complaints or symptoms of Chronic or frequent coughs, Shortness of Breath. Cardiovascular Denies complaints or symptoms of Chest pain, LE  edema. Gastrointestinal Denies complaints or symptoms of Frequent diarrhea, Nausea, Vomiting. Endocrine Denies complaints or symptoms of Hepatitis, Thyroid disease, Polydypsia (Excessive Thirst). Genitourinary Denies complaints or symptoms of Kidney failure/ Dialysis, Incontinence/dribbling. Immunological Denies complaints or symptoms of Hives, Itching. Integumentary (Skin) Denies complaints or symptoms of Wounds, Bleeding or bruising tendency, Breakdown, Swelling. Musculoskeletal Denies complaints or symptoms of Muscle Pain, Muscle Weakness. Neurologic Denies complaints or symptoms of Numbness/parasthesias, Focal/Weakness. Oncologic colon cancer Psychiatric Denies complaints or symptoms of Anxiety, Claustrophobia. [name], [name] ([id number]) Objective Constitutional sitting or standing blood pressure is within target range for patient.. pulse regular and within target range for patient.Marland Kitchen respirations regular, non-labored and within target range for patient.Marland Kitchen temperature within target range for patient.. Well- nourished and well-hydrated in no acute distress. Vitals Time Taken: 11:23 AM, Height: 63 in, Source: Stated, Weight: 113 lbs, Source: Stated, BMI: 20, Temperature: 98.1 F, Pulse: 78 bpm, Respiratory Rate: 16 breaths/min, Blood Pressure: 122/78 mmHg. Eyes conjunctiva clear no eyelid edema noted. pupils equal round and reactive to light and accommodation. Ears, Nose, Mouth, and Throat no gross abnormality of ear auricles or external auditory canals. normal hearing noted during conversation. mucus membranes moist. Respiratory normal breathing without difficulty. clear to auscultation bilaterally. Cardiovascular regular rate and rhythm with normal S1, S2. no clubbing, cyanosis, significant edema, Gastrointestinal (GI) soft, non-tender, non-distended, +BS. no ventral hernia noted. Musculoskeletal Patient unable to walk without assistance. no significant deformity or  arthritic changes, no loss or range of motion, no clubbing. Psychiatric Patient is not able to cooperate in decision making regarding care. Patient has dementia. patient is confused. General Notes: Upon inspection today patient's wound bed actually showed signs of having some viable skin that I felt like I could pull down over the left  upper arm ulcer. I was able to do this and then subsequently used 3 Steri-Strips to tack this down I think you actually heal quite nicely based on what I am seeing at this point. Subsequently in regard to the forearm ulcer that was not an area that I was able to pull the skin over in order to help at this point. In fact there was some necrotic tissue in the surface of the wound which I was able to mechanically debride away with saline gauze patient tolerated this today without complication. With regard to her right leg ulcer actually had to perform sharp debridement to clear away some of the necrotic tissue which was actually folded back on itself in order to allow for appropriate healing. Patient tolerated this with minimal discomfort at times only when the skin was being separated from where it had gotten stuck to the base of the wound folded under. The actual trimming did not really cause her any pain and post debridement wound bed appears to be doing better I am hopeful the remaining skin that is intact will actually readhere at this point. Integumentary (Hair, Skin) Wound #1 status is Open. Original cause of wound was Trauma. The wound is located on the Left Upper Arm. The wound measures 1cm length x 0.5cm width x 0.1cm depth; 0.393cm^2 area and 0.039cm^3 volume. There is Fat Layer (Subcutaneous Tissue) Exposed exposed. There is no tunneling or undermining noted. There is a medium amount of serosanguineous drainage noted. The wound margin is flat and intact. There is small (1-33%) red granulation within the wound bed. There is a large (67-100%) amount of  necrotic tissue within the wound bed including Adherent Slough. Wound #2 status is Open. Original cause of wound was Trauma. The wound is located on the Left,Lateral Forearm. The wound measures 0.5cm length x 0.5cm width x 0.1cm depth; 0.196cm^2 area and 0.02cm^3 volume. There is Fat Layer (Subcutaneous Tissue) Exposed exposed. There is no tunneling or undermining noted. There is a medium amount of sanguinous drainage noted. The wound margin is flat and intact. There is small (1-33%) red granulation within the wound bed. There is a medium (34-66%) amount of necrotic tissue within the wound bed including Adherent Slough. [name], [name] ([id number]) Wound #3 status is Open. Original cause of wound was Trauma. The wound is located on the Right,Posterior Lower Leg. The wound measures 11cm length x 5cm width x 0.1cm depth; 43.197cm^2 area and 4.32cm^3 volume. There is Fat Layer (Subcutaneous Tissue) Exposed exposed. There is no tunneling or undermining noted. There is a medium amount of sanguinous drainage noted. The wound margin is flat and intact. There is small (1-33%) red granulation within the wound bed. There is a large (67-100%) amount of necrotic tissue within the wound bed including Eschar and Adherent Slough. Assessment Active Problems ICD-10 Unspecified open wound of left upper arm, initial encounter Unspecified open wound of left forearm, initial encounter Unspecified open wound, right lower leg, initial encounter Unspecified dementia without behavioral disturbance Essential (primary) hypertension Chronic diastolic (congestive) heart failure Chronic kidney disease, stage 3 unspecified Procedures Wound #1 Pre-procedure diagnosis of Wound #1 is a Skin Tear located on the Left Upper Arm . There was a Chemical/Enzymatic/Mechanical debridement performed by STONE III, HOYT E., PA-C. With the following instrument(s): saline and gauze after achieving pain control using Lidocaine 4% Topical  Solution. Other agent used was Saline. A time out was conducted at 12:00, prior to the start of the procedure. A Minimum amount of bleeding  was controlled with Pressure. The procedure was tolerated well with a pain level of 0 throughout and a pain level of 0 following the procedure. Post Debridement Measurements: 1cm length x 0.5cm width x 0.1cm depth; 0.039cm^3 volume. Character of Wound/Ulcer Post Debridement is improved. Post procedure Diagnosis Wound #1: Same as Pre-Procedure Wound #2 Pre-procedure diagnosis of Wound #2 is a Skin Tear located on the Left,Lateral Forearm . There was a Chemical/Enzymatic/Mechanical debridement performed by STONE III, HOYT E., PA-C. With the following instrument(s): saline and gauze after achieving pain control using Lidocaine 4% Topical Solution. Other agent used was Saline. A time out was conducted at 12:02, prior to the start of the procedure. A Minimum amount of bleeding was controlled with Pressure. The procedure was tolerated well with a pain level of 0 throughout and a pain level of 0 following the procedure. Post Debridement Measurements: 0.5cm length x 0.5cm width x 0.1cm depth; 0.02cm^3 volume. Character of Wound/Ulcer Post Debridement is improved. Post procedure Diagnosis Wound #2: Same as Pre-Procedure Wound #3 Pre-procedure diagnosis of Wound #3 is a Skin Tear located on the Right,Posterior Lower Leg . There was a Excisional Skin/Subcutaneous Tissue Debridement with a total area of 55 sq cm performed by STONE III, HOYT E., PA-C. With the following instrument(s): Forceps, and Scissors to remove Viable and Non-Viable tissue/material. Material removed includes Subcutaneous Tissue, Skin: Dermis, and Skin: Epidermis after achieving pain control using Lidocaine 4% Topical Solution. A time out was conducted at 12:04, prior to the start of the procedure. A Minimum amount of bleeding was controlled with Pressure. The procedure was tolerated well with a pain  level of 0 throughout and a pain level of 0 following the procedure. [name], [name] ([id number]) Post Debridement Measurements: 11cm length x 5cm width x 0.2cm depth; 8.639cm^3 volume. Character of Wound/Ulcer Post Debridement is improved. Post procedure Diagnosis Wound #3: Same as Pre-Procedure Plan Wound Cleansing: Wound #1 Left Upper Arm: Clean wound with Normal Saline. Cleanse wound with mild soap and water May Shower, gently pat wound dry prior to applying new dressing. Wound #2 Left,Lateral Forearm: Clean wound with Normal Saline. Cleanse wound with mild soap and water May Shower, gently pat wound dry prior to applying new dressing. Wound #3 Right,Posterior Lower Leg: Clean wound with Normal Saline. Cleanse wound with mild soap and water May Shower, gently pat wound dry prior to applying new dressing. Primary Wound Dressing: Wound #1 Left Upper Arm: Xeroform Wound #2 Left,Lateral Forearm: Xeroform Wound #3 Right,Posterior Lower Leg: Silver Alginate - oil emulsion contact layer or adaptic to wound bed Secondary Dressing: Wound #1 Left Upper Arm: ABD and Kerlix/Conform - secure with netting Wound #2 Left,Lateral Forearm: ABD and Kerlix/Conform - secure with netting Wound #3 Right,Posterior Lower Leg: ABD and Kerlix/Conform - secure with netting Dressing Change Frequency: Wound #1 Left Upper Arm: Change dressing every other day. Wound #2 Left,Lateral Forearm: Change dressing every other day. Wound #3 Right,Posterior Lower Leg: Change dressing every other day. Follow-up Appointments: Return Appointment in 2 weeks. Home Health: Wound #1 Left Upper Arm: [facility] Nurse may visit PRN to address patient s wound care needs. FACE TO FACE ENCOUNTER: MEDICARE and MEDICAID PATIENTS: I certify that this patient is under my care and that I had a face-to-face encounter that meets the physician face-to-face encounter requirements with  this patient on this date. The encounter with the patient was in whole or in part for the following MEDICAL CONDITION: (primary reason for  Home Healthcare) MEDICAL NECESSITY: I certify, that based on my findings, NURSING services are a medically necessary home health service. HOME BOUND STATUS: I certify that my clinical findings support that this patient is homebound (i.e., Due to illness or injury, pt requires aid of supportive devices such as crutches, cane, wheelchairs, walkers, the use of special transportation or the assistance of another person to leave their place of residence. There is a normal inability to leave the home and doing so requires considerable and taxing effort. Other absences are for medical reasons / religious services and [name], ZERELDA PTACEK. ([id number]) are infrequent or of short duration when for other reasons). If current dressing causes regression in wound condition, may D/C ordered dressing product/s and apply Normal Saline Moist Dressing daily until next [place] / Other MD appointment. [place] of regression in wound condition at [phone]. Please direct any NON-WOUND related issues/requests for orders to patient's Primary Care Physician Wound #2 Left,Lateral Forearm: [place] [facility] Nurse may visit PRN to address patient s wound care needs. FACE TO FACE ENCOUNTER: MEDICARE and MEDICAID PATIENTS: I certify that this patient is under my care and that I had a face-to-face encounter that meets the physician face-to-face encounter requirements with this patient on this date. The encounter with the patient was in whole or in part for the following MEDICAL CONDITION: (primary reason for [place]) MEDICAL NECESSITY: I certify, that based on my findings, NURSING services are a medically necessary home health service. HOME BOUND STATUS: I certify that my clinical findings support that this patient is  homebound (i.e., Due to illness or injury, pt requires aid of supportive devices such as crutches, cane, wheelchairs, walkers, the use of special transportation or the assistance of another person to leave their place of residence. There is a normal inability to leave the home and doing so requires considerable and taxing effort. Other absences are for medical reasons / religious services and are infrequent or of short duration when for other reasons). If current dressing causes regression in wound condition, may D/C ordered dressing product/s and apply Normal Saline Moist Dressing daily until next [facility] / Other MD appointment. [place] of regression in wound condition at [phone]. Please direct any NON-WOUND related issues/requests for orders to patient's Primary Care Physician Wound #3 Right,Posterior Lower Leg: [place] for [facility] Nurse may visit PRN to address patient s wound care needs. FACE TO FACE ENCOUNTER: MEDICARE and MEDICAID PATIENTS: I certify that this patient is under my care and that I had a face-to-face encounter that meets the physician face-to-face encounter requirements with this patient on this date. The encounter with the patient was in whole or in part for the following MEDICAL CONDITION: (primary reason for [place]) MEDICAL NECESSITY: I certify, that based on my findings, NURSING services are a medically necessary home health service. HOME BOUND STATUS: I certify that my clinical findings support that this patient is homebound (i.e., Due to illness or injury, pt requires aid of supportive devices such as crutches, cane, wheelchairs, walkers, the use of special transportation or the assistance of another person to leave their place of residence. There is a normal inability to leave the home and doing so requires considerable and taxing effort. Other absences are for medical reasons / religious  services and are infrequent or of short duration when for other reasons). If current dressing causes regression in wound condition,  may D/C ordered dressing product/s and apply Normal Saline Moist Dressing daily until next [place] / Other MD appointment. [place] of regression in wound condition at [phone]. Please direct any NON-WOUND related issues/requests for orders to patient's Primary Care Physician 1. My suggestion at this time is good to be that we go ahead and initiate treatment with Xeroform for the left upper arm and forearm ulcers I think that skin to be the way to go at this time. 2. With regard to her right leg ulcer my suggestion here is good to be that we use Adaptic followed by silver alginate dressing to try to help control the moisture and prevent this from becoming stuck to the wound bed. I think Xeroform would probably be too moist for this area. 3. With regard to home health were again attempt to see about getting home health for the patient but I am not sure that were good to be able to find anyone who can take her as apparently the primary care provider is already contacted 9 different agencies and could not find anyone to help. 4. I would recommend as well that the patient continue to try to prevent any pressure to the back of her leg to allow this area to heal more effectively and appropriately. We will order supplies for them to be able to perform these dressing changes at home although depending on how things proceed home health may be involved as well. We will see patient back for reevaluation in 2 weeks here in the clinic. If anything worsens or changes patient will contact our office for additional recommendations. Electronic Signature(s) Signed: 12/15/2018 12:54:40 PM By: Irean Hong Shively, Melvin ([id number]) Entered By: Worthy Keeler on 12/15/2018 12:54:40 Grabski, [name]  ([id number]) -------------------------------------------------------------------------------- ROS/PFSH Details Patient Name: [name] Date of Service: 12/15/2018 11:15 AM Medical Record Number: [id number] Patient Account Number: [id number] Date of Birth/Sex: Oct 15, 1920 (85 y.o. F) Treating RN: Army Melia Primary Care Provider: Ria Bush Other Clinician: Referring Provider: Ria Bush Treating Provider/Extender: Melburn Hake, HOYT Weeks in Treatment: 0 Information Obtained From Patient Constitutional Symptoms (General Health) Complaints and Symptoms: Negative for: Fatigue; Fever; Chills; Marked Weight Change Eyes Complaints and Symptoms: Positive for: Glasses / Contacts Negative for: Dry Eyes; Vision Changes Medical History: Negative for: Cataracts; Glaucoma Ear/Nose/Mouth/Throat Complaints and Symptoms: Negative for: Difficult clearing ears; Sinusitis Medical History: Negative for: Chronic sinus problems/congestion; Middle ear problems Hematologic/Lymphatic Complaints and Symptoms: Negative for: Bleeding / Clotting Disorders; Human Immunodeficiency Virus Medical History: Negative for: Anemia; Hemophilia; Human Immunodeficiency Virus; Lymphedema; Sickle Cell Disease Respiratory Complaints and Symptoms: Negative for: Chronic or frequent coughs; Shortness of Breath Medical History: Negative for: Aspiration; Asthma; Chronic Obstructive Pulmonary Disease (COPD); Pneumothorax; Sleep Apnea; Tuberculosis Cardiovascular Complaints and Symptoms: Negative for: Chest pain; LE edema Medical History: Negative for: Angina; Arrhythmia; Congestive Heart Failure; Coronary Artery Disease; Deep Vein Thrombosis; Hypertension; Hypotension; Myocardial Infarction; Peripheral Arterial Disease; Peripheral Venous Disease; Phlebitis; CADEDRA, ABDULLAH. ([id number]) Vasculitis Gastrointestinal Complaints and Symptoms: Negative for: Frequent diarrhea; Nausea; Vomiting Medical  History: Negative for: Cirrhosis ; Colitis; Crohnos; Hepatitis A; Hepatitis B; Hepatitis C Endocrine Complaints and Symptoms: Negative for: Hepatitis; Thyroid disease; Polydypsia (Excessive Thirst) Medical History: Negative for: Type I Diabetes; Type II Diabetes Genitourinary Complaints and Symptoms: Negative for: Kidney failure/ Dialysis; Incontinence/dribbling Medical History: Negative for: End Stage Renal Disease Immunological Complaints and Symptoms: Negative for: Hives; Itching Medical History: Negative for: Lupus Erythematosus; Raynaudos; Scleroderma Integumentary (Skin) Complaints  and Symptoms: Negative for: Wounds; Bleeding or bruising tendency; Breakdown; Swelling Medical History: Negative for: History of Burn; History of pressure wounds Musculoskeletal Complaints and Symptoms: Negative for: Muscle Pain; Muscle Weakness Medical History: Negative for: Gout; Rheumatoid Arthritis; Osteoarthritis; Osteomyelitis Neurologic Complaints and Symptoms: Negative for: Numbness/parasthesias; Focal/Weakness Medical History: Negative for: Dementia; Neuropathy; Quadriplegia; Paraplegia; Seizure Disorder Psychiatric JAILEY, BISSEN. ([id number]) Complaints and Symptoms: Negative for: Anxiety; Claustrophobia Medical History: Negative for: Anorexia/bulimia; Confinement Anxiety Oncologic Complaints and Symptoms: Review of System Notes: colon cancer Medical History: Negative for: Received Chemotherapy; Received Radiation Immunizations Pneumococcal Vaccine: Received Pneumococcal Vaccination: Yes Implantable Devices None Family and Social History Cancer: No; Diabetes: Yes - Child; Heart Disease: No; Hereditary Spherocytosis: No; Hypertension: Yes - Child; Kidney Disease: No; Lung Disease: Yes - Child; Seizures: No; Stroke: No; Thyroid Problems: No; Tuberculosis: No; Never smoker; Marital Status - Widowed; Alcohol Use: Never; Drug Use: No History; Caffeine Use: Rarely; Financial  Concerns: No; Food, Clothing or Shelter Needs: No; Support System Lacking: No; Transportation Concerns: No Electronic Signature(s) Signed: 12/15/2018 1:53:04 PM By: Worthy Keeler PA-C Signed: 12/18/2018 5:38:10 PM By: Army Melia Entered By: Army Melia on 12/15/2018 11:29:54 Batterman, [name] ([id number]) -------------------------------------------------------------------------------- SuperBill Details Patient Name: [name] Date of Service: 12/15/2018 Medical Record Number: [id number] Patient Account Number: [id number] Date of Birth/Sex: 02-12-1920 (85 y.o. F) Treating RN: Montey Hora Primary Care Provider: Ria Bush Other Clinician: Referring Provider: Ria Bush Treating Provider/Extender: Melburn Hake, HOYT Weeks in Treatment: 0 Diagnosis Coding ICD-10 Codes Code Description S41.102A Unspecified open wound of left upper arm, initial encounter S51.802A Unspecified open wound of left forearm, initial encounter S81.801A Unspecified open wound, right lower leg, initial encounter F03.90 Unspecified dementia without behavioral disturbance I10 Essential (primary) hypertension [id number] Chronic diastolic (congestive) heart failure N18.30 Chronic kidney disease, stage 3 unspecified Facility Procedures CPT4 Code: [id number] Description: 99213 - WOUND CARE VISIT-LEV 3 EST PT Modifier: Quantity: 1 CPT4 Code: [id number] Description: 11042 - DEB SUBQ TISSUE 20 SQ CM/< ICD-10 Diagnosis Description S81.801A Unspecified open wound, right lower leg, initial encounter Modifier: Quantity: 1 CPT4 Code: [id number] Description: 11045 - DEB SUBQ TISS EA ADDL 20CM ICD-10 Diagnosis Description S81.801A Unspecified open wound, right lower leg, initial encounter Modifier: Quantity: 2 Physician Procedures CPT4 Code: [id number] Description: 99204 - WC PHYS LEVEL 4 - NEW PT ICD-10 Diagnosis Description S41.102A Unspecified open wound of left upper arm, initial encounter S51.802A  Unspecified open wound of left forearm, initial encounter S81.801A Unspecified open wound, right  lower leg, initial encounter F03.90 Unspecified dementia without behavioral disturbance Modifier: 25 Quantity: 1 CPT4 Code: [id number] Description: 11042 - WC PHYS SUBQ TISS 20 SQ CM ICD-10 Diagnosis Description S81.801A Unspecified open wound, right lower leg, initial encounter Modifier: Quantity: 1 CPT4 Code: [id number] Ackley, Chizara W Description: 11045 - WC PHYS SUBQ TISS EA ADDL 20 CM ICD-10 Diagnosis Description S81.801A Unspecified open wound, right lower leg, initial encounter . ([id number]) Modifier: Quantity: 2 Electronic Signature(s) Signed: 12/15/2018 12:55:17 PM By: Worthy Keeler PA-C Entered By: Worthy Keeler on 12/15/2018 12:55:16

## 2019-01-09 ENCOUNTER — Encounter: Payer: Medicare Other | Admitting: Physician Assistant

## 2019-01-09 ENCOUNTER — Other Ambulatory Visit: Payer: Self-pay

## 2019-01-09 DIAGNOSIS — L97212 Non-pressure chronic ulcer of right calf with fat layer exposed: Secondary | ICD-10-CM | POA: Diagnosis not present

## 2019-01-09 DIAGNOSIS — I5032 Chronic diastolic (congestive) heart failure: Secondary | ICD-10-CM | POA: Diagnosis not present

## 2019-01-09 DIAGNOSIS — Z8249 Family history of ischemic heart disease and other diseases of the circulatory system: Secondary | ICD-10-CM | POA: Diagnosis not present

## 2019-01-09 DIAGNOSIS — I130 Hypertensive heart and chronic kidney disease with heart failure and stage 1 through stage 4 chronic kidney disease, or unspecified chronic kidney disease: Secondary | ICD-10-CM | POA: Diagnosis not present

## 2019-01-09 DIAGNOSIS — N183 Chronic kidney disease, stage 3 unspecified: Secondary | ICD-10-CM | POA: Diagnosis not present

## 2019-01-09 DIAGNOSIS — S81801A Unspecified open wound, right lower leg, initial encounter: Secondary | ICD-10-CM | POA: Diagnosis not present

## 2019-01-09 DIAGNOSIS — S51802A Unspecified open wound of left forearm, initial encounter: Secondary | ICD-10-CM | POA: Diagnosis not present

## 2019-01-09 NOTE — Progress Notes (Addendum)
[name], [name] ([id number]) Visit Report for 01/09/2019 Chief Complaint Document Details Patient Name: [name], [name] Date of Service: 01/09/2019 2:30 PM Medical Record Number: [id number] Patient Account Number: [id number] Date of Birth/Sex: 02/07/1920 (85 y.o. F) Treating RN: Montey Hora Primary Care Provider: Ria Bush Other Clinician: Referring Provider: Ria Bush Treating Provider/Extender: Melburn Hake, Alajia Schmelzer Weeks in Treatment: 3 Information Obtained from: Patient Chief Complaint Left arm, left forearm, and right LE skin tears Electronic Signature(s) Signed: 01/09/2019 2:53:21 PM By: Worthy Keeler PA-C Entered By: Worthy Keeler on 01/09/2019 14:53:21 [name], [name] ([id number]) -------------------------------------------------------------------------------- Debridement Details Patient Name: [name] Date of Service: 01/09/2019 2:30 PM Medical Record Number: [id number] Patient Account Number: [id number] Date of Birth/Sex: July 07, 1920 (85 y.o. F) Treating RN: Montey Hora Primary Care Provider: Ria Bush Other Clinician: Referring Provider: Ria Bush Treating Provider/Extender: Melburn Hake, Broderick Fonseca Weeks in Treatment: 3 Debridement Performed for Wound #3 Right,Posterior Lower Leg Assessment: Performed By: Physician STONE III, Michaeljoseph Revolorio E., PA-C Debridement Type: Debridement Level of Consciousness (Pre- Awake and Alert procedure): Pre-procedure Verification/Time Yes - 15:05 Out Taken: Start Time: 15:05 Pain Control: Lidocaine 4% Topical Solution Total Area Debrided (L x W): 9.6 (cm) x 5.2 (cm) = 49.92 (cm) Tissue and other material Viable, Non-Viable, Slough, Subcutaneous, Slough, Other: dressing material debrided: Level: Skin/Subcutaneous Tissue Debridement Description: Excisional Instrument: Curette Bleeding: Minimum Hemostasis Achieved: Pressure End Time: 15:20 Procedural Pain: 0 Post Procedural Pain: 0 Response to  Treatment: Procedure was tolerated well Level of Consciousness Awake and Alert (Post-procedure): Post Debridement Measurements of Total Wound Length: (cm) 9.6 Width: (cm) 5.2 Depth: (cm) 0.2 Volume: (cm) 7.841 Character of Wound/Ulcer Post Debridement: Improved Post Procedure Diagnosis Same as Pre-procedure Electronic Signature(s) Signed: 01/09/2019 4:30:49 PM By: Montey Hora Signed: 01/10/2019 6:11:58 PM By: Worthy Keeler PA-C Entered By: Montey Hora on 01/09/2019 15:20:47 [name], [name] ([id number]) -------------------------------------------------------------------------------- HPI Details Patient Name: [name] Date of Service: 01/09/2019 2:30 PM Medical Record Number: [id number] Patient Account Number: [id number] Date of Birth/Sex: Dec 28, 1920 (85 y.o. F) Treating RN: Montey Hora Primary Care Provider: Ria Bush Other Clinician: Referring Provider: Ria Bush Treating Provider/Extender: Melburn Hake, Saja Bartolini Weeks in Treatment: 3 History of Present Illness HPI Description: 12/15/2018 on evaluation today patient presents for initial evaluation here in our clinic concerning issues that she has been having at multiple locations with skin tears. She had a fall roughly 2 weeks ago where she injured her left upper arm and forearm at that time. Subsequently she ended up as well with a skin tear to her leg when her family members were trying to get her into the bed her skin is so fragile that as she was lifting her legs the skin on the back of the leg tore. That is on the right. Nonetheless they have been trying to manage these I did see her primary care provider earlier in the week who referred her to [name] for further evaluation and treatment. Currently there does not appear to be any signs of active infection at this time which is good news at any site. No fevers, chills, nausea, vomiting, or diarrhea. She does have pain but this is mainly on the leg  ulcer the arms are really not to painful at this point. The patient does have a history of dementia, hypertension, congestive heart failure, and chronic kidney disease stage III. 12/29/2018 on evaluation today patient appears to be doing better in regard to her skin tears on the forearm both are healing quite nicely  one is close the other is slightly hyper granulated which is probably preventing complete epithelization. With regard to her right lower extremity unfortunately she is still having a lot of dressing material/alginate which is stuck to the wound bed. This seems to be a result of unfortunately having a different type of alginate than what we actually ordered for her. We had ordered silver cell as this was more nonstick unfortunately she received a different alginate which is breaking down and getting somewhat matted into the wound. In fact I would have to try to get some of this off today if at all possible. Other than that she seems to actually be healing nicely she has much less drainage than previously noted. 01/02/19 on evaluation today patient appears to be doing well with regard to her left forearm and right lower extremity ulcers. The left forearm appears to be completely healed which is great news the right leg is doing much better that there's still some eschar/dressing material stuck to the wound it appears to be healing underneath quite well. The Xeroform did much better for her. 01/09/2019 on evaluation today patient appears to be doing very well with regard to her right lower extremity ulcer. She has been tolerating the dressing changes without complication. Unfortunately she still has a lot of the alginate dressing stuck to the wound bed that somewhat mad again. It looks like there may be some healing underneath some of these areas but nonetheless I feel like we really need to try to remove that today. This was discussed with the patient's daughter as well. The good news is the  patient is really not having a lot of pain. Electronic Signature(s) Signed: 01/09/2019 3:23:20 PM By: Worthy Keeler PA-C Entered By: Worthy Keeler on 01/09/2019 15:23:20 [name], [name] ([id number]) -------------------------------------------------------------------------------- Physical Exam Details Patient Name: [name] Date of Service: 01/09/2019 2:30 PM Medical Record Number: [id number] Patient Account Number: [id number] Date of Birth/Sex: [birth year] (85 y.o. F) Treating RN: Montey Hora Primary Care Provider: Ria Bush Other Clinician: Referring Provider: Ria Bush Treating Provider/Extender: STONE III, Charline Hoskinson Weeks in Treatment: 3 Constitutional Well-nourished and well-hydrated in no acute distress. Respiratory normal breathing without difficulty. Psychiatric this patient is able to make decisions and demonstrates good insight into disease process. Alert and Oriented x 3. pleasant and cooperative. Notes Patient's wound bed currently showed signs of good granulation at this time. Fortunately there is no signs of active infection which is good news. No fevers, chills, nausea, vomiting, or diarrhea. With that being said her edema is fairly well controlled right now with how her dressing the wound and overall I feel like she is healing quite nicely. I did actually perform debridement to clear away some of the slough and necrotic debris on the surface of the wound as well as some of the residual dressing material from the alginate which became broken down and matted into the wound after the initial evaluation. Fortunately post debridement most of the area underneath this appears to be completely healed and the patient seems to be doing quite well. Electronic Signature(s) Signed: 01/09/2019 3:24:04 PM By: Worthy Keeler PA-C Entered By: Worthy Keeler on 01/09/2019 15:24:03 [name]  ([id number]) -------------------------------------------------------------------------------- Physician Orders Details Patient Name: [name] Date of Service: 01/09/2019 2:30 PM Medical Record Number: [id number] Patient Account Number: [id number] Date of Birth/Sex: 05-17-1920 (85 y.o. F) Treating RN: Montey Hora Primary Care Provider: Ria Bush Other Clinician: Referring Provider: Ria Bush Treating Provider/Extender: Joaquim Lai  III, Ezequias Lard Weeks in Treatment: 3 Verbal / Phone Orders: No Diagnosis Coding ICD-10 Coding Code Description [id number] Unspecified open wound of left upper arm, initial encounter S51.802A Unspecified open wound of left forearm, initial encounter S81.801A Unspecified open wound, right lower leg, initial encounter F03.90 Unspecified dementia without behavioral disturbance I10 Essential (primary) hypertension 99991111 Chronic diastolic (congestive) heart failure N18.30 Chronic kidney disease, stage 3 unspecified Wound Cleansing Wound #3 Right,Posterior Lower Leg o Clean wound with Normal Saline. o Cleanse wound with mild soap and water o May Shower, gently pat wound dry prior to applying new dressing. Primary Wound Dressing Wound #3 Right,Posterior Lower Leg o Xeroform Secondary Dressing Wound #3 Right,Posterior Lower Leg o ABD and Kerlix/Conform - secure with TubiGrip F Dressing Change Frequency Wound #3 Right,Posterior Lower Leg o Change dressing every other day. Follow-up Appointments o Return Appointment in 1 week. Home Health Wound #3 [place] Visits o Home Health Nurse may visit PRN to address patientos wound care needs. o FACE TO FACE ENCOUNTER: MEDICARE and MEDICAID PATIENTS: I certify that this patient is under my care and that I had a face-to-face encounter that meets the physician face-to-face encounter requirements with this patient on this date. The encounter with  the patient was in whole or in part for the following MEDICAL CONDITION: (primary reason for [place]) MEDICAL NECESSITY: I certify, that based on my findings, NURSING services are a medically necessary home health service. HOME BOUND STATUS: I certify that my clinical findings support that this patient is homebound (i.e., Due to illness or injury, pt requires aid of [place], SHAMONI MEARS. ([id number]) supportive devices such as crutches, cane, wheelchairs, walkers, the use of special transportation or the assistance of another person to leave their place of residence. There is a normal inability to leave the home and doing so requires considerable and taxing effort. Other absences are for medical reasons / religious services and are infrequent or of short duration when for other reasons). o If current dressing causes regression in wound condition, may D/C ordered dressing product/s and apply Normal Saline Moist Dressing daily until next [place] / Other MD appointment. [place] of regression in wound condition at [phone]. o Please direct any NON-WOUND related issues/requests for orders to patient's Primary Care Physician Electronic Signature(s) Signed: 01/09/2019 4:30:49 PM By: Montey Hora Signed: 01/10/2019 6:11:58 PM By: Worthy Keeler PA-C Entered By: Montey Hora on 01/09/2019 15:21:14 Sobczyk, [name] ([id number]) -------------------------------------------------------------------------------- Problem List Details Patient Name: [name] Date of Service: 01/09/2019 2:30 PM Medical Record Number: [id number] Patient Account Number: [id number] Date of Birth/Sex: 15-May-1920 (85 y.o. F) Treating RN: Montey Hora Primary Care Provider: Ria Bush Other Clinician: Referring Provider: Ria Bush Treating Provider/Extender: Melburn Hake, Chrissi Crow Weeks in Treatment: 3 Active Problems ICD-10 Evaluated Encounter Code  Description Active Date Today Diagnosis S41.102A Unspecified open wound of left upper arm, initial encounter 12/15/2018 No Yes S51.802A Unspecified open wound of left forearm, initial encounter 12/15/2018 No Yes S81.801A Unspecified open wound, right lower leg, initial encounter 12/15/2018 No Yes F03.90 Unspecified dementia without behavioral disturbance 12/15/2018 No Yes I10 Essential (primary) hypertension 12/15/2018 No Yes [id number] Chronic diastolic (congestive) heart failure 12/15/2018 No Yes N18.30 Chronic kidney disease, stage 3 unspecified 12/15/2018 No Yes Inactive Problems Resolved Problems Electronic Signature(s) Signed: 01/09/2019 2:53:15 PM By: Worthy Keeler PA-C Entered By: Worthy Keeler on 01/09/2019 14:53:15 [name], [name] ([id number]) -------------------------------------------------------------------------------- Progress Note Details Patient Name: Traci Flakes  W. Date of Service: 01/09/2019 2:30 PM Medical Record Number: [id number] Patient Account Number: [id number] Date of Birth/Sex: 1920-12-07 (85 y.o. F) Treating RN: Montey Hora Primary Care Provider: Ria Bush Other Clinician: Referring Provider: Ria Bush Treating Provider/Extender: Melburn Hake, Natacha Jepsen Weeks in Treatment: 3 Subjective Chief Complaint Information obtained from Patient Left arm, left forearm, and right LE skin tears History of Present Illness (HPI) 12/15/2018 on evaluation today patient presents for initial evaluation here in our clinic concerning issues that she has been having at multiple locations with skin tears. She had a fall roughly 2 weeks ago where she injured her left upper arm and forearm at that time. Subsequently she ended up as well with a skin tear to her leg when her family members were trying to get her into the bed her skin is so fragile that as she was lifting her legs the skin on the back of the leg tore. That is on the right. Nonetheless they have been  trying to manage these I did see her primary care provider earlier in the week who referred her to [name] for further evaluation and treatment. Currently there does not appear to be any signs of active infection at this time which is good news at any site. No fevers, chills, nausea, vomiting, or diarrhea. She does have pain but this is mainly on the leg ulcer the arms are really not to painful at this point. The patient does have a history of dementia, hypertension, congestive heart failure, and chronic kidney disease stage III. 12/29/2018 on evaluation today patient appears to be doing better in regard to her skin tears on the forearm both are healing quite nicely one is close the other is slightly hyper granulated which is probably preventing complete epithelization. With regard to her right lower extremity unfortunately she is still having a lot of dressing material/alginate which is stuck to the wound bed. This seems to be a result of unfortunately having a different type of alginate than what we actually ordered for her. We had ordered silver cell as this was more nonstick unfortunately she received a different alginate which is breaking down and getting somewhat matted into the wound. In fact I would have to try to get some of this off today if at all possible. Other than that she seems to actually be healing nicely she has much less drainage than previously noted. 01/02/19 on evaluation today patient appears to be doing well with regard to her left forearm and right lower extremity ulcers. The left forearm appears to be completely healed which is great news the right leg is doing much better that there's still some eschar/dressing material stuck to the wound it appears to be healing underneath quite well. The Xeroform did much better for her. 01/09/2019 on evaluation today patient appears to be doing very well with regard to her right lower extremity ulcer. She has been tolerating the dressing  changes without complication. Unfortunately she still has a lot of the alginate dressing stuck to the wound bed that somewhat mad again. It looks like there may be some healing underneath some of these areas but nonetheless I feel like we really need to try to remove that today. This was discussed with the patient's daughter as well. The good news is the patient is really not having a lot of pain. Patient History Information obtained from Patient. Family History Diabetes - Child, Hypertension - Child, Lung Disease - Child, No family history of Cancer, Heart Disease, Hereditary Spherocytosis,  Kidney Disease, Seizures, Stroke, Thyroid Problems, Tuberculosis. Social History Never smoker, Marital Status - Widowed, Alcohol Use - Never, Drug Use - No History, Caffeine Use - Rarely. [name], [name] ([id number]) Medical History Eyes Denies history of Cataracts, Glaucoma Ear/Nose/Mouth/Throat Denies history of Chronic sinus problems/congestion, Middle ear problems Hematologic/Lymphatic Denies history of Anemia, Hemophilia, Human Immunodeficiency Virus, Lymphedema, Sickle Cell Disease Respiratory Denies history of Aspiration, Asthma, Chronic Obstructive Pulmonary Disease (COPD), Pneumothorax, Sleep Apnea, Tuberculosis Cardiovascular Denies history of Angina, Arrhythmia, Congestive Heart Failure, Coronary Artery Disease, Deep Vein Thrombosis, Hypertension, Hypotension, Myocardial Infarction, Peripheral Arterial Disease, Peripheral Venous Disease, Phlebitis, Vasculitis Gastrointestinal Denies history of Cirrhosis , Colitis, Crohn s, Hepatitis A, Hepatitis B, Hepatitis C Endocrine Denies history of Type I Diabetes, Type II Diabetes Genitourinary Denies history of End Stage Renal Disease Immunological Denies history of Lupus Erythematosus, Raynaud s, Scleroderma Integumentary (Skin) Denies history of History of Burn, History of pressure wounds Musculoskeletal Denies history of Gout,  Rheumatoid Arthritis, Osteoarthritis, Osteomyelitis Neurologic Denies history of Dementia, Neuropathy, Quadriplegia, Paraplegia, Seizure Disorder Oncologic Denies history of Received Chemotherapy, Received Radiation Psychiatric Denies history of Anorexia/bulimia, Confinement Anxiety Review of Systems (ROS) Constitutional Symptoms (General Health) Denies complaints or symptoms of Fatigue, Fever, Chills, Marked Weight Change. Respiratory Denies complaints or symptoms of Chronic or frequent coughs, Shortness of Breath. Cardiovascular Complains or has symptoms of LE edema. Denies complaints or symptoms of Chest pain. Psychiatric Denies complaints or symptoms of Anxiety, Claustrophobia. Objective Constitutional Well-nourished and well-hydrated in no acute distress. Vitals Time Taken: 2:37 PM, Height: 63 in, Weight: 113 lbs, BMI: 20, Temperature: 98.8 F, Pulse: 84 bpm, Respiratory Rate: 16 breaths/min, Blood Pressure: 138/60 mmHg. [name], [name] ([id number]) Respiratory normal breathing without difficulty. Psychiatric this patient is able to make decisions and demonstrates good insight into disease process. Alert and Oriented x 3. pleasant and cooperative. General Notes: Patient's wound bed currently showed signs of good granulation at this time. Fortunately there is no signs of active infection which is good news. No fevers, chills, nausea, vomiting, or diarrhea. With that being said her edema is fairly well controlled right now with how her dressing the wound and overall I feel like she is healing quite nicely. I did actually perform debridement to clear away some of the slough and necrotic debris on the surface of the wound as well as some of the residual dressing material from the alginate which became broken down and matted into the wound after the initial evaluation. Fortunately post debridement most of the area underneath this appears to be completely healed and the  patient seems to be doing quite well. Integumentary (Hair, Skin) Wound #3 status is Open. Original cause of wound was Trauma. The wound is located on the Right,Posterior Lower Leg. The wound measures 9.6cm length x 5.2cm width x 0.1cm depth; 39.207cm^2 area and 3.921cm^3 volume. There is Fat Layer (Subcutaneous Tissue) Exposed exposed. There is no tunneling or undermining noted. There is a medium amount of sanguinous drainage noted. The wound margin is flat and intact. There is small (1-33%) red granulation within the wound bed. There is a large (67-100%) amount of necrotic tissue within the wound bed including Eschar and Adherent Slough. Assessment Active Problems ICD-10 Unspecified open wound of left upper arm, initial encounter Unspecified open wound of left forearm, initial encounter Unspecified open wound, right lower leg, initial encounter Unspecified dementia without behavioral disturbance Essential (primary) hypertension Chronic diastolic (congestive) heart failure Chronic kidney disease, stage 3 unspecified Procedures Wound #3 Pre-procedure diagnosis of  Wound #3 is a Skin Tear located on the Right,Posterior Lower Leg . There was a Excisional Skin/Subcutaneous Tissue Debridement with a total area of 49.92 sq cm performed by STONE III, Paidyn Mcferran E., PA-C. With the following instrument(s): Curette to remove Viable and Non-Viable tissue/material. Material removed includes Subcutaneous Tissue, Slough, and Other: dressing material after achieving pain control using Lidocaine 4% Topical Solution. No specimens were taken. A time out was conducted at 15:05, prior to the start of the procedure. A Minimum amount of bleeding was controlled with Pressure. The procedure was tolerated well with a pain level of 0 throughout and a pain level of 0 following the procedure. Post Debridement Measurements: 9.6cm length x 5.2cm width x 0.2cm depth; 7.841cm^3 volume. Character of Wound/Ulcer Post Debridement  is improved. Post procedure Diagnosis Wound #3: Same as Pre-Procedure [name], Traci W. ([id number]) Plan Wound Cleansing: Wound #3 Right,Posterior Lower Leg: Clean wound with Normal Saline. Cleanse wound with mild soap and water May Shower, gently pat wound dry prior to applying new dressing. Primary Wound Dressing: Wound #3 Right,Posterior Lower Leg: Xeroform Secondary Dressing: Wound #3 Right,Posterior Lower Leg: ABD and Kerlix/Conform - secure with TubiGrip F Dressing Change Frequency: Wound #3 Right,Posterior Lower Leg: Change dressing every other day. Follow-up Appointments: Return Appointment in 1 week. Home Health: Wound #3 Right,Posterior Lower Leg: [place] Nurse may visit PRN to address patient s wound care needs. FACE TO FACE ENCOUNTER: MEDICARE and MEDICAID PATIENTS: I certify that this patient is under my care and that I had a face-to-face encounter that meets the physician face-to-face encounter requirements with this patient on this date. The encounter with the patient was in whole or in part for the following MEDICAL CONDITION: (primary reason for [place]) MEDICAL NECESSITY: I certify, that based on my findings, NURSING services are a medically necessary home health service. HOME BOUND STATUS: I certify that my clinical findings support that this patient is homebound (i.e., Due to illness or injury, pt requires aid of supportive devices such as crutches, cane, wheelchairs, walkers, the use of special transportation or the assistance of another person to leave their place of residence. There is a normal inability to leave the home and doing so requires considerable and taxing effort. Other absences are for medical reasons / religious services and are infrequent or of short duration when for other reasons). If current dressing causes regression in wound condition, may D/C ordered dressing product/s and apply Normal Saline Moist  Dressing daily until next [place] / Other MD appointment. [place] of regression in wound condition at [phone]. Please direct any NON-WOUND related issues/requests for orders to patient's Primary Care Physician 1. My suggestion at this time based on what I am seeing is that we go ahead and continue with the current wound care measures which includes the Xeroform dressing I feel like that is doing quite well for her. 2. I am also subsequently feeling like on top of the fact that the wound is healing quite nicely that there is no signs of infection and the patient is really not have any pain all of which is good news. 3. With regard to the dressings hopefully the Xeroform will continue to not stick to the wound to cause any problems obviously this seems to be doing quite well based on what I am seeing at this point. We will see patient back for reevaluation in 1 week here in the clinic. If anything worsens or changes  patient will contact our office for additional recommendations. Electronic Signature(s) Signed: 01/09/2019 3:25:08 PM By: Irean Hong O[place], [place] ([id number]) Entered By: Worthy Keeler on 01/09/2019 15:25:08 [name] ([id number]) -------------------------------------------------------------------------------- ROS/PFSH Details Patient Name: [name] Date of Service: 01/09/2019 2:30 PM Medical Record Number: [id number] Patient Account Number: [id number] Date of Birth/Sex: 09/29/1920 (85 y.o. F) Treating RN: Montey Hora Primary Care Provider: Ria Bush Other Clinician: Referring Provider: Ria Bush Treating Provider/Extender: Melburn Hake, Shawnie Nicole Weeks in Treatment: 3 Information Obtained From Patient Constitutional Symptoms (General Health) Complaints and Symptoms: Negative for: Fatigue; Fever; Chills; Marked Weight Change Respiratory Complaints and Symptoms: Negative for: Chronic or frequent  coughs; Shortness of Breath Medical History: Negative for: Aspiration; Asthma; Chronic Obstructive Pulmonary Disease (COPD); Pneumothorax; Sleep Apnea; Tuberculosis Cardiovascular Complaints and Symptoms: Positive for: LE edema Negative for: Chest pain Medical History: Negative for: Angina; Arrhythmia; Congestive Heart Failure; Coronary Artery Disease; Deep Vein Thrombosis; Hypertension; Hypotension; Myocardial Infarction; Peripheral Arterial Disease; Peripheral Venous Disease; Phlebitis; Vasculitis Psychiatric Complaints and Symptoms: Negative for: Anxiety; Claustrophobia Medical History: Negative for: Anorexia/bulimia; Confinement Anxiety Eyes Medical History: Negative for: Cataracts; Glaucoma Ear/Nose/Mouth/Throat Medical History: Negative for: Chronic sinus problems/congestion; Middle ear problems Hematologic/Lymphatic Medical History: Negative for: Anemia; Hemophilia; Human Immunodeficiency Virus; Lymphedema; Sickle Cell Disease [name], Traci W. ([id number]) Gastrointestinal Medical History: Negative for: Cirrhosis ; Colitis; Crohnos; Hepatitis A; Hepatitis B; Hepatitis C Endocrine Medical History: Negative for: Type I Diabetes; Type II Diabetes Genitourinary Medical History: Negative for: End Stage Renal Disease Immunological Medical History: Negative for: Lupus Erythematosus; Raynaudos; Scleroderma Integumentary (Skin) Medical History: Negative for: History of Burn; History of pressure wounds Musculoskeletal Medical History: Negative for: Gout; Rheumatoid Arthritis; Osteoarthritis; Osteomyelitis Neurologic Medical History: Negative for: Dementia; Neuropathy; Quadriplegia; Paraplegia; Seizure Disorder Oncologic Medical History: Negative for: Received Chemotherapy; Received Radiation Immunizations Pneumococcal Vaccine: Received Pneumococcal Vaccination: Yes Implantable Devices None Family and Social History Cancer: No; Diabetes: Yes - Child; Heart  Disease: No; Hereditary Spherocytosis: No; Hypertension: Yes - Child; Kidney Disease: No; Lung Disease: Yes - Child; Seizures: No; Stroke: No; Thyroid Problems: No; Tuberculosis: No; Never smoker; Marital Status - Widowed; Alcohol Use: Never; Drug Use: No History; Caffeine Use: Rarely; Financial Concerns: No; Food, Clothing or Shelter Needs: No; Support System Lacking: No; Transportation Concerns: No Physician Affirmation I have reviewed and agree with the above information. Electronic Signature(s) Signed: 01/09/2019 4:30:49 PM By: Traci Edwards ([id number]) Signed: 01/10/2019 6:11:58 PM By: Worthy Keeler PA-C Entered By: Worthy Keeler on 01/09/2019 15:23:37 [name], [name] ([id number]) -------------------------------------------------------------------------------- SuperBill Details Patient Name: [name] Date of Service: 01/09/2019 Medical Record Number: [id number] Patient Account Number: [id number] Date of Birth/Sex: Dec 31, 1920 (85 y.o. F) Treating RN: Montey Hora Primary Care Provider: Ria Bush Other Clinician: Referring Provider: Ria Bush Treating Provider/Extender: Melburn Hake, Mareena Cavan Weeks in Treatment: 3 Diagnosis Coding ICD-10 Codes Code Description S41.102A Unspecified open wound of left upper arm, initial encounter S51.802A Unspecified open wound of left forearm, initial encounter S81.801A Unspecified open wound, right lower leg, initial encounter F03.90 Unspecified dementia without behavioral disturbance I10 Essential (primary) hypertension [id number] Chronic diastolic (congestive) heart failure N18.30 Chronic kidney disease, stage 3 unspecified Facility Procedures CPT4 Code: [id number] Description: [id number] - DEB SUBQ TISSUE 20 SQ CM/< ICD-10 Diagnosis Description S81.801A Unspecified open wound, right lower leg, initial encounter Modifier: Quantity: 1 CPT4 Code: [id number] Description: 11045 - DEB SUBQ TISS EA ADDL 20CM ICD-10  Diagnosis Description S81.801A Unspecified open wound, right lower leg, initial encounter  Modifier: Quantity: 2 Physician Procedures CPT4 Code: [id number] Description: [id number] - WC PHYS SUBQ TISS 20 SQ CM ICD-10 Diagnosis Description [id number] Unspecified open wound, right lower leg, initial encounter Modifier: Quantity: 1 CPT4 Code: [id number] Description: [id number] - WC PHYS SUBQ TISS EA ADDL 20 CM ICD-10 Diagnosis Description T137275 Unspecified open wound, right lower leg, initial encounter Modifier: Quantity: 2 Electronic Signature(s) Signed: 01/09/2019 3:27:29 PM By: Worthy Keeler PA-C Entered By: Worthy Keeler on 01/09/2019 15:27:29

## 2019-01-09 NOTE — Progress Notes (Signed)
Traci [name] [name], Traci [name] [name] ([id number]) Visit Report for 01/09/2019 Arrival Information Details Patient Name: Traci [name], [name] Date of Service: 01/09/2019 2:30 PM Medical Record Number: [id number] Patient Account Number: [id number] Date of Birth/Sex: 1920/12/09 (85 y.o. F) Treating RN: Army Melia Primary Care Ural Acree: Ria Bush Other Clinician: Referring Malone Vanblarcom: Ria Bush Treating Edem Tiegs/Extender: Melburn Hake, HOYT Weeks in Treatment: 3 Visit Information History Since Last Visit Added or deleted any medications: No Patient Arrived: Walker Any new allergies or adverse reactions: No Arrival Time: 14:36 Had a fall or experienced change in No Accompanied By: family activities of daily living that may affect Transfer Assistance: None risk of falls: Patient Identification Verified: Yes Signs or symptoms of abuse/neglect since last visito No Hospitalized since last visit: No Has Dressing in Place as Prescribed: Yes Pain Present Now: No Electronic Signature(s) Signed: 01/09/2019 4:14:53 PM By: Army Melia Entered By: Army Melia on 01/09/2019 14:37:11 Caprara, Traci [name] [name] ([id number]) -------------------------------------------------------------------------------- Encounter Discharge Information Details Patient Name: Traci [name] [name] Date of Service: 01/09/2019 2:30 PM Medical Record Number: [id number] Patient Account Number: [id number] Date of Birth/Sex: 02/20/1920 (85 y.o. F) Treating RN: Montey Hora Primary Care Vitoria Conyer: Ria Bush Other Clinician: Referring Dreden Rivere: Ria Bush Treating Akshara Blumenthal/Extender: Melburn Hake, HOYT Weeks in Treatment: 3 Encounter Discharge Information Items Post Procedure Vitals Discharge Condition: Stable Temperature (F): 98.8 Ambulatory Status: Walker Pulse (bpm): 84 Discharge Destination: Home Respiratory Rate (breaths/min): 16 Transportation: Private Auto Blood Pressure (mmHg): 138/60 Accompanied By:  daughter Schedule Follow-up Appointment: Yes Clinical Summary of Care: Electronic Signature(s) Signed: 01/09/2019 4:30:49 PM By: Montey Hora Entered By: Montey Hora on 01/09/2019 15:22:14 [name], Traci [name] [name] ([id number]) -------------------------------------------------------------------------------- Lower Extremity Assessment Details Patient Name: Traci [name] [name] Date of Service: 01/09/2019 2:30 PM Medical Record Number: [id number] Patient Account Number: [id number] Date of Birth/Sex: 1920/09/09 (85 y.o. F) Treating RN: Army Melia Primary Care Joshue Badal: Ria Bush Other Clinician: Referring Avaeh Ewer: Ria Bush Treating Dontrel Smethers/Extender: STONE III, HOYT Weeks in Treatment: 3 Edema Assessment Assessed: [Left: No] [Right: No] Edema: [Left: N] [Right: o] Vascular Assessment Pulses: Dorsalis Pedis Palpable: [Right:Yes] Electronic Signature(s) Signed: 01/09/2019 4:14:53 PM By: Army Melia Entered By: Army Melia on 01/09/2019 14:42:32 [name], Traci [name] [name] ([id number]) -------------------------------------------------------------------------------- Multi Wound Chart Details Patient Name: Traci [name] [name] Date of Service: 01/09/2019 2:30 PM Medical Record Number: [id number] Patient Account Number: [id number] Date of Birth/Sex: 1920-03-17 (85 y.o. F) Treating RN: Montey Hora Primary Care Seena Face: Ria Bush Other Clinician: Referring Raneen Jaffer: Ria Bush Treating Lillyahna Hemberger/Extender: STONE III, HOYT Weeks in Treatment: 3 Vital Signs Height(in): 63 Pulse(bpm): 22 Weight(lbs): 113 Blood Pressure(mmHg): 138/60 Body Mass Index(BMI): 20 Temperature(F): 98.8 Respiratory Rate 16 (breaths/min): Photos: [3:No Photos] [N/A:N/A] Wound Location: [3:Right Lower Leg - Posterior] [N/A:N/A] Wounding Event: [3:Trauma] [N/A:N/A] Primary Etiology: [3:Skin Tear] [N/A:N/A] Date Acquired: [3:12/09/2018] [N/A:N/A] Weeks of Treatment: [3:3]  [N/A:N/A] Wound Status: [3:Open] [N/A:N/A] Measurements L x W x D [3:9.6x5.2x0.1] [N/A:N/A] (cm) Area (cm) : [3:39.207] [N/A:N/A] Volume (cm) : [3:3.921] [N/A:N/A] % Reduction in Area: [3:9.20%] [N/A:N/A] % Reduction in Volume: [3:9.20%] [N/A:N/A] Classification: [3:Full Thickness Without Exposed Support Structures] [N/A:N/A] Exudate Amount: [3:Medium] [N/A:N/A] Exudate Type: [3:Sanguinous] [N/A:N/A] Exudate Color: [3:red] [N/A:N/A] Wound Margin: [3:Flat and Intact] [N/A:N/A] Granulation Amount: [3:Small (1-33%)] [N/A:N/A] Granulation Quality: [3:Red] [N/A:N/A] Necrotic Amount: [3:Large (67-100%)] [N/A:N/A] Necrotic Tissue: [3:Eschar, Adherent Slough] [N/A:N/A] Exposed Structures: [3:Fat Layer (Subcutaneous Tissue) Exposed: Yes Fascia: No Tendon: No Muscle: No Joint: No Bone: No None] [N/A:N/A N/A] Treatment Notes Electronic Signature(s) Signed: 01/09/2019 4:30:49 PM By: Traci [name] Edwards ([id number]) Entered ByMarjory Lies,  Joanna on 01/09/2019 15:05:09 Traci [name], [name] ([id number]) -------------------------------------------------------------------------------- [place] Details Patient Name: Traci [name], [name] Date of Service: 01/09/2019 2:30 PM Medical Record Number: [id number] Patient Account Number: [id number] Date of Birth/Sex: December 16, 1920 (85 y.o. F) Treating RN: Montey Hora Primary Care Julianny Milstein: Ria Bush Other Clinician: Referring Jewell Haught: Ria Bush Treating Ivori Storr/Extender: Melburn Hake, HOYT Weeks in Treatment: 3 Active Inactive Abuse / Safety / Falls / Self Care Management Nursing Diagnoses: Potential for falls Goals: Patient will not experience any injury related to falls Date Initiated: 12/15/2018 Target Resolution Date: 03/03/2019 Goal Status: Active Interventions: Assess fall risk on admission and as needed Notes: Orientation to the Wound Care Program Nursing Diagnoses: Knowledge deficit related to the  wound healing center program Goals: Patient/caregiver will verbalize understanding of the [facility] Program Date Initiated: 12/15/2018 Target Resolution Date: 03/03/2019 Goal Status: Active Interventions: Provide education on orientation to the wound center Notes: Wound/Skin Impairment Nursing Diagnoses: Impaired tissue integrity Goals: Ulcer/skin breakdown will heal within 14 weeks Date Initiated: 12/15/2018 Target Resolution Date: 03/03/2019 Goal Status: Active Interventions: Assess patient/caregiver ability to obtain necessary supplies Powell, Mckynna W. ([id number]) Assess patient/caregiver ability to perform ulcer/skin care regimen upon admission and as needed Assess ulceration(s) every visit Notes: Electronic Signature(s) Signed: 01/09/2019 4:30:49 PM By: Montey Hora Entered By: Montey Hora on 01/09/2019 15:05:02 [place], Traci [name] [name] ([id number]) -------------------------------------------------------------------------------- Pain Assessment Details Patient Name: Traci [name] [name] Date of Service: 01/09/2019 2:30 PM Medical Record Number: [id number] Patient Account Number: [id number] Date of Birth/Sex: Mar 26, 1920 (85 y.o. F) Treating RN: Army Melia Primary Care Holliday Sheaffer: Ria Bush Other Clinician: Referring Toddy Boyd: Ria Bush Treating Raisa Ditto/Extender: Melburn Hake, HOYT Weeks in Treatment: 3 Active Problems Location of Pain Severity and Description of Pain Patient Has Paino No Site Locations Pain Management and Medication Current Pain Management: Electronic Signature(s) Signed: 01/09/2019 4:14:53 PM By: Army Melia Entered By: Army Melia on 01/09/2019 14:37:25 Traci [name] [name], Traci [name] [name] ([id number]) -------------------------------------------------------------------------------- Patient/Caregiver Education Details Patient Name: Traci [name] [name] Date of Service: 01/09/2019 2:30 PM Medical Record Number: [id number] Patient Account Number:  [id number] Date of Birth/Gender: Feb 23, 1920 (85 y.o. F) Treating RN: Montey Hora Primary Care Physician: Ria Bush Other Clinician: Referring Physician: Ria Bush Treating Physician/Extender: Sharalyn Ink in Treatment: 3 Education Assessment Education Provided To: Patient and Caregiver Education Topics Provided Wound/Skin Impairment: Handouts: Other: wound care as ordered Methods: Demonstration, Explain/Verbal Responses: State content correctly Electronic Signature(s) Signed: 01/09/2019 4:30:49 PM By: Montey Hora Entered By: Montey Hora on 01/09/2019 15:21:30 Hofstra, Traci [name] [name] ([id number]) -------------------------------------------------------------------------------- Wound Assessment Details Patient Name: Traci [name] [name] Date of Service: 01/09/2019 2:30 PM Medical Record Number: [id number] Patient Account Number: [id number] Date of Birth/Sex: [birth year] (85 y.o. F) Treating RN: Army Melia Primary Care Elmo Shumard: Ria Bush Other Clinician: Referring Toniesha Zellner: Ria Bush Treating Renika Shiflet/Extender: Melburn Hake, HOYT Weeks in Treatment: 3 Wound Status Wound Number: 3 Primary Etiology: Skin Tear Wound Location: Right Lower Leg - Posterior Wound Status: Open Wounding Event: Trauma Date Acquired: 12/09/2018 Weeks Of Treatment: 3 Clustered Wound: No Wound Measurements Length: (cm) 9.6 Width: (cm) 5.2 Depth: (cm) 0.1 Area: (cm) 39.207 Volume: (cm) 3.921 % Reduction in Area: 9.2% % Reduction in Volume: 9.2% Epithelialization: None Tunneling: No Undermining: No Wound Description Full Thickness Without Exposed Support Classification: Structures Wound Margin: Flat and Intact Exudate Medium Amount: Exudate Type: Sanguinous Exudate Color: red Foul Odor After Cleansing: No Slough/Fibrino Yes Wound Bed Granulation Amount: Small (1-33%) Exposed Structure Granulation Quality: Red Fascia Exposed: No Necrotic Amount:  Large (67-100%) Fat  Layer (Subcutaneous Tissue) Exposed: Yes Necrotic Quality: Eschar, Adherent Slough Tendon Exposed: No Muscle Exposed: No Joint Exposed: No Bone Exposed: No Treatment Notes Wound #3 (Right, Posterior Lower Leg) Notes leg - xeroform, abd, conform and tubigrip F Electronic Signature(s) Signed: 01/09/2019 4:14:53 PM By: Army Melia Entered By: Army Melia on 01/09/2019 14:40:52 Frierson, Traci [name] [name] ([id number]) -------------------------------------------------------------------------------- Vitals Details Patient Name: Traci [name] [name] Date of Service: 01/09/2019 2:30 PM Medical Record Number: [id number] Patient Account Number: [id number] Date of Birth/Sex: 02/18/1920 (85 y.o. F) Treating RN: Army Melia Primary Care Dontravious Camille: Ria Bush Other Clinician: Referring Reilly Blades: Ria Bush Treating Teshara Moree/Extender: Melburn Hake, HOYT Weeks in Treatment: 3 Vital Signs Time Taken: 14:37 Temperature (F): 98.8 Height (in): 63 Pulse (bpm): 84 Weight (lbs): 113 Respiratory Rate (breaths/min): 16 Body Mass Index (BMI): 20 Blood Pressure (mmHg): 138/60 Reference Range: 80 - 120 mg / dl Electronic Signature(s) Signed: 01/09/2019 4:14:53 PM By: Army Melia Entered By: Army Melia on 01/09/2019 14:37:39

## 2019-01-11 ENCOUNTER — Other Ambulatory Visit: Payer: Self-pay | Admitting: Family Medicine

## 2019-01-15 ENCOUNTER — Other Ambulatory Visit: Payer: Self-pay | Admitting: Family Medicine

## 2019-01-16 ENCOUNTER — Other Ambulatory Visit: Payer: Self-pay

## 2019-01-16 ENCOUNTER — Encounter: Payer: Medicare Other | Admitting: Physician Assistant

## 2019-01-16 ENCOUNTER — Other Ambulatory Visit: Payer: Self-pay | Admitting: Family Medicine

## 2019-01-16 DIAGNOSIS — S81801A Unspecified open wound, right lower leg, initial encounter: Secondary | ICD-10-CM | POA: Diagnosis not present

## 2019-01-16 DIAGNOSIS — I5032 Chronic diastolic (congestive) heart failure: Secondary | ICD-10-CM | POA: Diagnosis not present

## 2019-01-16 DIAGNOSIS — I130 Hypertensive heart and chronic kidney disease with heart failure and stage 1 through stage 4 chronic kidney disease, or unspecified chronic kidney disease: Secondary | ICD-10-CM | POA: Diagnosis not present

## 2019-01-16 DIAGNOSIS — Z8249 Family history of ischemic heart disease and other diseases of the circulatory system: Secondary | ICD-10-CM | POA: Diagnosis not present

## 2019-01-16 DIAGNOSIS — S51802A Unspecified open wound of left forearm, initial encounter: Secondary | ICD-10-CM | POA: Diagnosis not present

## 2019-01-16 DIAGNOSIS — N183 Chronic kidney disease, stage 3 unspecified: Secondary | ICD-10-CM | POA: Diagnosis not present

## 2019-01-16 DIAGNOSIS — L97212 Non-pressure chronic ulcer of right calf with fat layer exposed: Secondary | ICD-10-CM | POA: Diagnosis not present

## 2019-01-16 NOTE — Progress Notes (Addendum)
MIYONA, HYMAN ([id number]) Visit Report for 01/16/2019 Chief Complaint Document Details Patient Name: [name], [name] Date of Service: 01/16/2019 3:30 PM Medical Record Number: [id number] Patient Account Number: [id number] Date of Birth/Sex: 09/17/1920 (85 y.o. F) Treating RN: Montey Hora Primary Care Provider: Ria Bush Other Clinician: Referring Provider: Ria Bush Treating Provider/Extender: Melburn Hake, Dannie Hattabaugh Weeks in Treatment: 4 Information Obtained from: Patient Chief Complaint Left arm, left forearm, and right LE skin tears Electronic Signature(s) Signed: 01/16/2019 3:45:14 PM By: Worthy Keeler PA-C Entered By: Worthy Keeler on 01/16/2019 15:45:14 Weisberg, [name] ([id number]) -------------------------------------------------------------------------------- HPI Details Patient Name: [name] Date of Service: 01/16/2019 3:30 PM Medical Record Number: [id number] Patient Account Number: [id number] Date of Birth/Sex: 07-04-1920 (85 y.o. F) Treating RN: Montey Hora Primary Care Provider: Ria Bush Other Clinician: Referring Provider: Ria Bush Treating Provider/Extender: Melburn Hake, Mitsuru Dault Weeks in Treatment: 4 History of Present Illness HPI Description: 12/15/2018 on evaluation today patient presents for initial evaluation here in our clinic concerning issues that she has been having at multiple locations with skin tears. She had a fall roughly 2 weeks ago where she injured her left upper arm and forearm at that time. Subsequently she ended up as well with a skin tear to her leg when her family members were trying to get her into the bed her skin is so fragile that as she was lifting her legs the skin on the back of the leg tore. That is on the right. Nonetheless they have been trying to manage these I did see her primary care provider earlier in the week who referred her to [name] for further evaluation and treatment. Currently there  does not appear to be any signs of active infection at this time which is good news at any site. No fevers, chills, nausea, vomiting, or diarrhea. She does have pain but this is mainly on the leg ulcer the arms are really not to painful at this point. The patient does have a history of dementia, hypertension, congestive heart failure, and chronic kidney disease stage III. 12/29/2018 on evaluation today patient appears to be doing better in regard to her skin tears on the forearm both are healing quite nicely one is close the other is slightly hyper granulated which is probably preventing complete epithelization. With regard to her right lower extremity unfortunately she is still having a lot of dressing material/alginate which is stuck to the wound bed. This seems to be a result of unfortunately having a different type of alginate than what we actually ordered for her. We had ordered silver cell as this was more nonstick unfortunately she received a different alginate which is breaking down and getting somewhat matted into the wound. In fact I would have to try to get some of this off today if at all possible. Other than that she seems to actually be healing nicely she has much less drainage than previously noted. 01/02/19 on evaluation today patient appears to be doing well with regard to her left forearm and right lower extremity ulcers. The left forearm appears to be completely healed which is great news the right leg is doing much better that there's still some eschar/dressing material stuck to the wound it appears to be healing underneath quite well. The Xeroform did much better for her. 01/09/2019 on evaluation today patient appears to be doing very well with regard to her right lower extremity ulcer. She has been tolerating the dressing changes without complication. Unfortunately she still has a  lot of the alginate dressing stuck to the wound bed that somewhat mad again. It looks like there may  be some healing underneath some of these areas but nonetheless I feel like we really need to try to remove that today. This was discussed with the patient's daughter as well. The good news is the patient is really not having a lot of pain. 01/16/2019 upon evaluation today patient appears to be doing well with regard to her wound. In fact this is measuring quite a bit smaller and overall I am very pleased with how things seem to be progressing. The patient seems to be doing well although she does have dementia her family member who is with her today is very pleased with how things seem to be progressing. Electronic Signature(s) Signed: 01/16/2019 4:54:45 PM By: Worthy Keeler PA-C Entered By: Worthy Keeler on 01/16/2019 16:54:44 Schexnider, [name] ([id number]) -------------------------------------------------------------------------------- Physical Exam Details Patient Name: [name] Date of Service: 01/16/2019 3:30 PM Medical Record Number: [id number] Patient Account Number: [id number] Date of Birth/Sex: Jun 27, 1920 (85 y.o. F) Treating RN: Montey Hora Primary Care Provider: Ria Bush Other Clinician: Referring Provider: Ria Bush Treating Provider/Extender: STONE III, Chiffon Kittleson Weeks in Treatment: 4 Constitutional Thin and well-hydrated in no acute distress. Respiratory normal breathing without difficulty. clear to auscultation bilaterally. Cardiovascular regular rate and rhythm with normal S1, S2. Psychiatric this patient is able to make decisions and demonstrates good insight into disease process. Alert and Oriented x 3. pleasant and cooperative. Notes Patient's wound currently did not require any sharp debridement we did perform a fairly extensive sharp debridement last week in order to clear away a lot of the stuck on and dried alginate dressing from when this was used nonetheless she seems to be doing much better today all of the new skin underneath that  area seems to have toughen up and overall I am very pleased with the size and progress with this wound I think that she is making excellent progress no sharp debridement was necessary today and I am hopeful this will continue to make Electronic Signature(s) Signed: 01/16/2019 4:55:24 PM By: Worthy Keeler PA-C Entered By: Worthy Keeler on 01/16/2019 16:55:23 Zirbes, [name] ([id number]) -------------------------------------------------------------------------------- Physician Orders Details Patient Name: [name] Date of Service: 01/16/2019 3:30 PM Medical Record Number: [id number] Patient Account Number: [id number] Date of Birth/Sex: 07-25-1920 (85 y.o. F) Treating RN: Montey Hora Primary Care Provider: Ria Bush Other Clinician: Referring Provider: Ria Bush Treating Provider/Extender: Melburn Hake, Aliana Kreischer Weeks in Treatment: 4 Verbal / Phone Orders: No Diagnosis Coding ICD-10 Coding Code Description S41.102A Unspecified open wound of left upper arm, initial encounter S51.802A Unspecified open wound of left forearm, initial encounter S81.801A Unspecified open wound, right lower leg, initial encounter F03.90 Unspecified dementia without behavioral disturbance I10 Essential (primary) hypertension 99991111 Chronic diastolic (congestive) heart failure N18.30 Chronic kidney disease, stage 3 unspecified Wound Cleansing Wound #3 Right,Posterior Lower Leg o Clean wound with Normal Saline. o Cleanse wound with mild soap and water o May Shower, gently pat wound dry prior to applying new dressing. Primary Wound Dressing Wound #3 Right,Posterior Lower Leg o Xeroform Secondary Dressing Wound #3 Right,Posterior Lower Leg o ABD and Kerlix/Conform - secure with TubiGrip F Dressing Change Frequency Wound #3 Right,Posterior Lower Leg o Change dressing every other day. Follow-up Appointments o Return Appointment in 2 weeks. Edema Control Wound #3  Right,Posterior Lower Leg o Other: - [place] Wound #3 Right,Posterior Lower Leg o Continue  [place] Nurse may visit PRN to address patientos wound care needs. o FACE TO FACE ENCOUNTER: MEDICARE and MEDICAID PATIENTS: I certify that this patient is under my care and that I had a face-to-face encounter that meets the physician face-to-face encounter requirements with this YONINA, BASTION. ([id number]) patient on this date. The encounter with the patient was in whole or in part for the following MEDICAL CONDITION: (primary reason for [place]) MEDICAL NECESSITY: I certify, that based on my findings, NURSING services are a medically necessary home health service. HOME BOUND STATUS: I certify that my clinical findings support that this patient is homebound (i.e., Due to illness or injury, pt requires aid of supportive devices such as crutches, cane, wheelchairs, walkers, the use of special transportation or the assistance of another person to leave their place of residence. There is a normal inability to leave the home and doing so requires considerable and taxing effort. Other absences are for medical reasons / religious services and are infrequent or of short duration when for other reasons). o If current dressing causes regression in wound condition, may D/C ordered dressing product/s and apply Normal Saline Moist Dressing daily until next [place] / Other MD appointment. [place] of regression in wound condition at [phone]. o Please direct any NON-WOUND related issues/requests for orders to patient's Primary Care Physician Electronic Signature(s) Signed: 01/16/2019 4:45:05 PM By: Montey Hora Signed: 01/16/2019 5:15:45 PM By: Worthy Keeler PA-C Entered By: Montey Hora on 01/16/2019 16:19:31 Kellenberger, [name]  ([id number]) -------------------------------------------------------------------------------- Problem List Details Patient Name: [name] Date of Service: 01/16/2019 3:30 PM Medical Record Number: [id number] Patient Account Number: [id number] Date of Birth/Sex: 26-Mar-1920 (85 y.o. F) Treating RN: Montey Hora Primary Care Provider: Ria Bush Other Clinician: Referring Provider: Ria Bush Treating Provider/Extender: Melburn Hake, Earlene Bjelland Weeks in Treatment: 4 Active Problems ICD-10 Evaluated Encounter Code Description Active Date Today Diagnosis S41.102A Unspecified open wound of left upper arm, initial encounter 12/15/2018 No Yes S51.802A Unspecified open wound of left forearm, initial encounter 12/15/2018 No Yes S81.801A Unspecified open wound, right lower leg, initial encounter 12/15/2018 No Yes F03.90 Unspecified dementia without behavioral disturbance 12/15/2018 No Yes I10 Essential (primary) hypertension 12/15/2018 No Yes [id number] Chronic diastolic (congestive) heart failure 12/15/2018 No Yes N18.30 Chronic kidney disease, stage 3 unspecified 12/15/2018 No Yes Inactive Problems Resolved Problems Electronic Signature(s) Signed: 01/16/2019 3:45:06 PM By: Worthy Keeler PA-C Entered By: Worthy Keeler on 01/16/2019 15:45:05 Granja, [name] ([id number]) -------------------------------------------------------------------------------- Progress Note Details Patient Name: [name] Date of Service: 01/16/2019 3:30 PM Medical Record Number: [id number] Patient Account Number: [id number] Date of Birth/Sex: 10/23/1920 (85 y.o. F) Treating RN: Montey Hora Primary Care Provider: Ria Bush Other Clinician: Referring Provider: Ria Bush Treating Provider/Extender: Melburn Hake, Gladis Soley Weeks in Treatment: 4 Subjective Chief Complaint Information obtained from Patient Left arm, left forearm, and right LE skin tears History of Present Illness  (HPI) 12/15/2018 on evaluation today patient presents for initial evaluation here in our clinic concerning issues that she has been having at multiple locations with skin tears. She had a fall roughly 2 weeks ago where she injured her left upper arm and forearm at that time. Subsequently she ended up as well with a skin tear to her leg when her family members were trying to get her into the bed her skin is so fragile that as she was lifting her legs the skin on the back of the leg  tore. That is on the right. Nonetheless they have been trying to manage these I did see her primary care provider earlier in the week who referred her to [name] for further evaluation and treatment. Currently there does not appear to be any signs of active infection at this time which is good news at any site. No fevers, chills, nausea, vomiting, or diarrhea. She does have pain but this is mainly on the leg ulcer the arms are really not to painful at this point. The patient does have a history of dementia, hypertension, congestive heart failure, and chronic kidney disease stage III. 12/29/2018 on evaluation today patient appears to be doing better in regard to her skin tears on the forearm both are healing quite nicely one is close the other is slightly hyper granulated which is probably preventing complete epithelization. With regard to her right lower extremity unfortunately she is still having a lot of dressing material/alginate which is stuck to the wound bed. This seems to be a result of unfortunately having a different type of alginate than what we actually ordered for her. We had ordered silver cell as this was more nonstick unfortunately she received a different alginate which is breaking down and getting somewhat matted into the wound. In fact I would have to try to get some of this off today if at all possible. Other than that she seems to actually be healing nicely she has much less drainage than previously  noted. 01/02/19 on evaluation today patient appears to be doing well with regard to her left forearm and right lower extremity ulcers. The left forearm appears to be completely healed which is great news the right leg is doing much better that there's still some eschar/dressing material stuck to the wound it appears to be healing underneath quite well. The Xeroform did much better for her. 01/09/2019 on evaluation today patient appears to be doing very well with regard to her right lower extremity ulcer. She has been tolerating the dressing changes without complication. Unfortunately she still has a lot of the alginate dressing stuck to the wound bed that somewhat mad again. It looks like there may be some healing underneath some of these areas but nonetheless I feel like we really need to try to remove that today. This was discussed with the patient's daughter as well. The good news is the patient is really not having a lot of pain. 01/16/2019 upon evaluation today patient appears to be doing well with regard to her wound. In fact this is measuring quite a bit smaller and overall I am very pleased with how things seem to be progressing. The patient seems to be doing well although she does have dementia her family member who is with her today is very pleased with how things seem to be progressing. Patient History Information obtained from Patient. Family History Diabetes - Child, Hypertension - Child, Lung Disease - Child, No family history of Cancer, Heart Disease, Hereditary Spherocytosis, Kidney Disease, Seizures, Stroke, Thyroid Problems, Conwell, Mishael W. ([id number]) Tuberculosis. Social History Never smoker, Marital Status - Widowed, Alcohol Use - Never, Drug Use - No History, Caffeine Use - Rarely. Medical History Eyes Denies history of Cataracts, Glaucoma Ear/Nose/Mouth/Throat Denies history of Chronic sinus problems/congestion, Middle ear problems Hematologic/Lymphatic Denies  history of Anemia, Hemophilia, Human Immunodeficiency Virus, Lymphedema, Sickle Cell Disease Respiratory Denies history of Aspiration, Asthma, Chronic Obstructive Pulmonary Disease (COPD), Pneumothorax, Sleep Apnea, Tuberculosis Cardiovascular Denies history of Angina, Arrhythmia, Congestive Heart Failure, Coronary Artery Disease, Deep  Vein Thrombosis, Hypertension, Hypotension, Myocardial Infarction, Peripheral Arterial Disease, Peripheral Venous Disease, Phlebitis, Vasculitis Gastrointestinal Denies history of Cirrhosis , Colitis, Crohn s, Hepatitis A, Hepatitis B, Hepatitis C Endocrine Denies history of Type I Diabetes, Type II Diabetes Genitourinary Denies history of End Stage Renal Disease Immunological Denies history of Lupus Erythematosus, Raynaud s, Scleroderma Integumentary (Skin) Denies history of History of Burn, History of pressure wounds Musculoskeletal Denies history of Gout, Rheumatoid Arthritis, Osteoarthritis, Osteomyelitis Neurologic Denies history of Dementia, Neuropathy, Quadriplegia, Paraplegia, Seizure Disorder Oncologic Denies history of Received Chemotherapy, Received Radiation Psychiatric Denies history of Anorexia/bulimia, Confinement Anxiety Review of Systems (ROS) Constitutional Symptoms (General Health) Denies complaints or symptoms of Fatigue, Fever, Chills, Marked Weight Change. Respiratory Denies complaints or symptoms of Chronic or frequent coughs, Shortness of Breath. Cardiovascular Complains or has symptoms of LE edema. Denies complaints or symptoms of Chest pain. Psychiatric Denies complaints or symptoms of Anxiety, Claustrophobia. Objective Bouza, Marissia W. ([id number]) Constitutional Thin and well-hydrated in no acute distress. Vitals Time Taken: 3:40 PM, Height: 63 in, Weight: 113 lbs, BMI: 20, Temperature: 98.3 F, Pulse: 90 bpm, Respiratory Rate: 16 breaths/min, Blood Pressure: 132/72 mmHg. Respiratory normal breathing without  difficulty. clear to auscultation bilaterally. Cardiovascular regular rate and rhythm with normal S1, S2. Psychiatric this patient is able to make decisions and demonstrates good insight into disease process. Alert and Oriented x 3. pleasant and cooperative. General Notes: Patient's wound currently did not require any sharp debridement we did perform a fairly extensive sharp debridement last week in order to clear away a lot of the stuck on and dried alginate dressing from when this was used nonetheless she seems to be doing much better today all of the new skin underneath that area seems to have toughen up and overall I am very pleased with the size and progress with this wound I think that she is making excellent progress no sharp debridement was necessary today and I am hopeful this will continue to make Integumentary (Hair, Skin) Wound #3 status is Open. Original cause of wound was Trauma. The wound is located on the Right,Posterior Lower Leg. The wound measures 2.5cm length x 1cm width x 0.1cm depth; 1.963cm^2 area and 0.196cm^3 volume. There is Fat Layer (Subcutaneous Tissue) Exposed exposed. There is no tunneling or undermining noted. There is a medium amount of sanguinous drainage noted. The wound margin is flat and intact. There is medium (34-66%) red granulation within the wound bed. There is a medium (34-66%) amount of necrotic tissue within the wound bed including Eschar and Adherent Slough. Assessment Active Problems ICD-10 Unspecified open wound of left upper arm, initial encounter Unspecified open wound of left forearm, initial encounter Unspecified open wound, right lower leg, initial encounter Unspecified dementia without behavioral disturbance Essential (primary) hypertension Chronic diastolic (congestive) heart failure Chronic kidney disease, stage 3 unspecified Plan Wound Cleansing: Wound #3 Right,Posterior Lower Leg: Clean wound with Normal Saline. NITASHA, GUIA ([id number]) Cleanse wound with mild soap and water May Shower, gently pat wound dry prior to applying new dressing. Primary Wound Dressing: Wound #3 Right,Posterior Lower Leg: Xeroform Secondary Dressing: Wound #3 Right,Posterior Lower Leg: ABD and Kerlix/Conform - secure with TubiGrip F Dressing Change Frequency: Wound #3 Right,Posterior Lower Leg: Change dressing every other day. Follow-up Appointments: Return Appointment in 2 weeks. Edema Control: Wound #3 Right,Posterior Lower Leg: Other: - [place]: Wound #3 Right,Posterior Lower Leg: [place] Nurse may visit PRN to address patient s wound care needs.  FACE TO FACE ENCOUNTER: MEDICARE and MEDICAID PATIENTS: I certify that this patient is under my care and that I had a face-to-face encounter that meets the physician face-to-face encounter requirements with this patient on this date. The encounter with the patient was in whole or in part for the following MEDICAL CONDITION: (primary reason for [place]) MEDICAL NECESSITY: I certify, that based on my findings, NURSING services are a medically necessary home health service. HOME BOUND STATUS: I certify that my clinical findings support that this patient is homebound (i.e., Due to illness or injury, pt requires aid of supportive devices such as crutches, cane, wheelchairs, walkers, the use of special transportation or the assistance of another person to leave their place of residence. There is a normal inability to leave the home and doing so requires considerable and taxing effort. Other absences are for medical reasons / religious services and are infrequent or of short duration when for other reasons). If current dressing causes regression in wound condition, may D/C ordered dressing product/s and apply Normal Saline Moist Dressing daily until next [place] / Other MD appointment. [place] of  regression in wound condition at [phone]. Please direct any NON-WOUND related issues/requests for orders to patient's Primary Care Physician 1. My suggestion at this time is good to be that we continue with the current wound care measures and the patient is in agreement with the plan. Specifically this is the Xeroform that were going to utilizing at this time. 2. I would recommend as well that we continue with the Tubigrip which also seems to be beneficial. We will see patient back for reevaluation in 2 weeks here in the clinic. If anything worsens or changes patient will contact our office for additional recommendations. Electronic Signature(s) Signed: 01/16/2019 4:56:48 PM By: Worthy Keeler PA-C Entered By: Worthy Keeler on 01/16/2019 16:56:47 Mcqueary, [name] ([id number]) -------------------------------------------------------------------------------- ROS/PFSH Details Patient Name: [name] Date of Service: 01/16/2019 3:30 PM Medical Record Number: [id number] Patient Account Number: [id number] Date of Birth/Sex: 1920-03-02 (85 y.o. F) Treating RN: Montey Hora Primary Care Provider: Ria Bush Other Clinician: Referring Provider: Ria Bush Treating Provider/Extender: Melburn Hake, Taz Vanness Weeks in Treatment: 4 Information Obtained From Patient Constitutional Symptoms (General Health) Complaints and Symptoms: Negative for: Fatigue; Fever; Chills; Marked Weight Change Respiratory Complaints and Symptoms: Negative for: Chronic or frequent coughs; Shortness of Breath Medical History: Negative for: Aspiration; Asthma; Chronic Obstructive Pulmonary Disease (COPD); Pneumothorax; Sleep Apnea; Tuberculosis Cardiovascular Complaints and Symptoms: Positive for: LE edema Negative for: Chest pain Medical History: Negative for: Angina; Arrhythmia; Congestive Heart Failure; Coronary Artery Disease; Deep Vein Thrombosis; Hypertension; Hypotension; Myocardial  Infarction; Peripheral Arterial Disease; Peripheral Venous Disease; Phlebitis; Vasculitis Psychiatric Complaints and Symptoms: Negative for: Anxiety; Claustrophobia Medical History: Negative for: Anorexia/bulimia; Confinement Anxiety Eyes Medical History: Negative for: Cataracts; Glaucoma Ear/Nose/Mouth/Throat Medical History: Negative for: Chronic sinus problems/congestion; Middle ear problems Hematologic/Lymphatic Medical History: Negative for: Anemia; Hemophilia; Human Immunodeficiency Virus; Lymphedema; Sickle Cell Disease Alvelo, Camella W. ([id number]) Gastrointestinal Medical History: Negative for: Cirrhosis ; Colitis; Crohnos; Hepatitis A; Hepatitis B; Hepatitis C Endocrine Medical History: Negative for: Type I Diabetes; Type II Diabetes Genitourinary Medical History: Negative for: End Stage Renal Disease Immunological Medical History: Negative for: Lupus Erythematosus; Raynaudos; Scleroderma Integumentary (Skin) Medical History: Negative for: History of Burn; History of pressure wounds Musculoskeletal Medical History: Negative for: Gout; Rheumatoid Arthritis; Osteoarthritis; Osteomyelitis Neurologic Medical History: Negative for: Dementia; Neuropathy; Quadriplegia; Paraplegia; Seizure Disorder Oncologic Medical History: Negative for:  Received Chemotherapy; Received Radiation Immunizations Pneumococcal Vaccine: Received Pneumococcal Vaccination: Yes Implantable Devices None Family and Social History Cancer: No; Diabetes: Yes - Child; Heart Disease: No; Hereditary Spherocytosis: No; Hypertension: Yes - Child; Kidney Disease: No; Lung Disease: Yes - Child; Seizures: No; Stroke: No; Thyroid Problems: No; Tuberculosis: No; Never smoker; Marital Status - Widowed; Alcohol Use: Never; Drug Use: No History; Caffeine Use: Rarely; Financial Concerns: No; Food, Clothing or Shelter Needs: No; Support System Lacking: No; Transportation Concerns: No Physician Affirmation I  have reviewed and agree with the above information. Electronic Signature(s) Signed: 01/16/2019 5:15:45 PM By: Irean Hong Longmire, Proctor ([id number]) Signed: 01/17/2019 4:45:20 PM By: Montey Hora Entered By: Worthy Keeler on 01/16/2019 16:55:05 Kellen, [name] ([id number]) -------------------------------------------------------------------------------- SuperBill Details Patient Name: [name] Date of Service: 01/16/2019 Medical Record Number: [id number] Patient Account Number: [id number] Date of Birth/Sex: [birth year] (85 y.o. F) Treating RN: Montey Hora Primary Care Provider: Ria Bush Other Clinician: Referring Provider: Ria Bush Treating Provider/Extender: Melburn Hake, Shakyla Nolley Weeks in Treatment: 4 Diagnosis Coding ICD-10 Codes Code Description [id number] Unspecified open wound of left upper arm, initial encounter S51.802A Unspecified open wound of left forearm, initial encounter S81.801A Unspecified open wound, right lower leg, initial encounter F03.90 Unspecified dementia without behavioral disturbance I10 Essential (primary) hypertension [id number] Chronic diastolic (congestive) heart failure N18.30 Chronic kidney disease, stage 3 unspecified Facility Procedures CPT4 Code: [id number] Description: 99213 - WOUND CARE VISIT-LEV 3 EST PT Modifier: Quantity: 1 Physician Procedures CPT4 Code: [id number] Description: [id number] - WC PHYS LEVEL 4 - EST PT ICD-10 Diagnosis Description S41.102A Unspecified open wound of left upper arm, initial encount S51.802A Unspecified open wound of left forearm, initial encounter S81.801A Unspecified open wound, right lower  leg, initial encounte F03.90 Unspecified dementia without behavioral disturbance Modifier: er r Quantity: 1 Electronic Signature(s) Signed: 01/16/2019 4:56:58 PM By: Worthy Keeler PA-C Entered By: Worthy Keeler on 01/16/2019 16:56:57

## 2019-01-17 NOTE — Progress Notes (Signed)
REGINALD, LIUZZI ([id number]) Visit Report for 01/16/2019 Arrival Information Details Patient Name: [name], [name] Date of Service: 01/16/2019 3:30 PM Medical Record Number: [id number] Patient Account Number: [id number] Date of Birth/Sex: 10-Jun-1920 (85 y.o. F) Treating RN: Montey Hora Primary Care Marita Burnsed: Ria Bush Other Clinician: Referring Lexii Walsh: Ria Bush Treating Ramzey Petrovic/Extender: Melburn Hake, HOYT Weeks in Treatment: 4 Visit Information History Since Last Visit Added or deleted any medications: No Patient Arrived: [place] Any new allergies or adverse reactions: No Arrival Time: 15:41 Had a fall or experienced change in No Accompanied By: daughter activities of daily living that may affect Transfer Assistance: Manual risk of falls: Patient Identification Verified: Yes Signs or symptoms of abuse/neglect since last visito No Secondary Verification Process Completed: Yes Hospitalized since last visit: No Implantable device outside of the clinic excluding No cellular tissue based products placed in the center since last visit: Has Dressing in Place as Prescribed: Yes Pain Present Now: No Electronic Signature(s) Signed: 01/16/2019 3:55:13 PM By: Lorine Bears RCP, RRT, CHT Entered By: Lorine Bears on 01/16/2019 15:41:53 Bealer, [name] ([id number]) -------------------------------------------------------------------------------- Clinic Level of Care Assessment Details Patient Name: [name] Date of Service: 01/16/2019 3:30 PM Medical Record Number: [id number] Patient Account Number: [id number] Date of Birth/Sex: 1921-01-08 (85 y.o. F) Treating RN: Montey Hora Primary Care Dewanda Fennema: Ria Bush Other Clinician: Referring Briceida Rasberry: Ria Bush Treating Kutter Schnepf/Extender: Melburn Hake, HOYT Weeks in Treatment: 4 Clinic Level of Care Assessment Items TOOL 4 Quantity Score []  - Use when only an EandM  is performed on FOLLOW-UP visit 0 ASSESSMENTS - Nursing Assessment / Reassessment X - Reassessment of Co-morbidities (includes updates in patient status) 1 10 X- 1 5 Reassessment of Adherence to Treatment Plan ASSESSMENTS - Wound and Skin Assessment / Reassessment X - Simple Wound Assessment / Reassessment - one wound 1 5 []  - 0 Complex Wound Assessment / Reassessment - multiple wounds []  - 0 Dermatologic / Skin Assessment (not related to wound area) ASSESSMENTS - Focused Assessment []  - Circumferential Edema Measurements - multi extremities 0 []  - 0 Nutritional Assessment / Counseling / Intervention X- 1 5 Lower Extremity Assessment (monofilament, tuning fork, pulses) []  - 0 Peripheral Arterial Disease Assessment (using hand held doppler) ASSESSMENTS - Ostomy and/or Continence Assessment and Care []  - Incontinence Assessment and Management 0 []  - 0 Ostomy Care Assessment and Management (repouching, etc.) PROCESS - Coordination of Care X - Simple Patient / Family Education for ongoing care 1 15 []  - 0 Complex (extensive) Patient / Family Education for ongoing care X- 1 10 Staff obtains Programmer, systems, Records, Test Results / Process Orders []  - 0 Staff telephones HHA, Nursing Homes / Clarify orders / etc []  - 0 Routine Transfer to another Facility (non-emergent condition) []  - 0 Routine Hospital Admission (non-emergent condition) []  - 0 New Admissions / Biomedical engineer / Ordering NPWT, Apligraf, etc. []  - 0 Emergency Hospital Admission (emergent condition) X- 1 10 Simple Discharge Coordination Prusinski, Ramya W. ([id number]) []  - 0 Complex (extensive) Discharge Coordination PROCESS - Special Needs []  - Pediatric / Minor Patient Management 0 []  - 0 Isolation Patient Management []  - 0 Hearing / Language / Visual special needs []  - 0 Assessment of Community assistance (transportation, D/C planning, etc.) []  - 0 Additional assistance / Altered mentation []  -  0 Support Surface(s) Assessment (bed, cushion, seat, etc.) INTERVENTIONS - Wound Cleansing / Measurement X - Simple Wound Cleansing - one wound 1 5 []  - 0 Complex Wound Cleansing - multiple wounds  X- 1 5 Wound Imaging (photographs - any number of wounds) []  - 0 Wound Tracing (instead of photographs) X- 1 5 Simple Wound Measurement - one wound []  - 0 Complex Wound Measurement - multiple wounds INTERVENTIONS - Wound Dressings []  - Small Wound Dressing one or multiple wounds 0 []  - 0 Medium Wound Dressing one or multiple wounds X- 1 20 Large Wound Dressing one or multiple wounds []  - 0 Application of Medications - topical []  - 0 Application of Medications - injection INTERVENTIONS - Miscellaneous []  - External ear exam 0 []  - 0 Specimen Collection (cultures, biopsies, blood, body fluids, etc.) []  - 0 Specimen(s) / Culture(s) sent or taken to Lab for analysis []  - 0 Patient Transfer (multiple staff / Civil Service fast streamer / Similar devices) []  - 0 Simple Staple / Suture removal (25 or less) []  - 0 Complex Staple / Suture removal (26 or more) []  - 0 Hypo / Hyperglycemic Management (close monitor of Blood Glucose) []  - 0 Ankle / Brachial Index (ABI) - do not check if billed separately X- 1 5 Vital Signs Monteforte, Ailah W. ([id number]) Has the patient been seen at the hospital within the last three years: Yes Total Score: 100 Level Of Care: New/Established - Level 3 Electronic Signature(s) Signed: 01/16/2019 4:45:05 PM By: Montey Hora Entered By: Montey Hora on 01/16/2019 16:41:33 Colonna, [name] ([id number]) -------------------------------------------------------------------------------- Encounter Discharge Information Details Patient Name: [name] Date of Service: 01/16/2019 3:30 PM Medical Record Number: [id number] Patient Account Number: [id number] Date of Birth/Sex: [birth year] (85 y.o. F) Treating RN: Montey Hora Primary Care Kenedie Dirocco: Ria Bush  Other Clinician: Referring Jazper Nikolai: Ria Bush Treating Lorrane Mccay/Extender: Melburn Hake, HOYT Weeks in Treatment: 4 Encounter Discharge Information Items Discharge Condition: Stable Ambulatory Status: Wheelchair Discharge Destination: Home Transportation: Private Auto Accompanied By: daughter Schedule Follow-up Appointment: Yes Clinical Summary of Care: Electronic Signature(s) Signed: 01/16/2019 4:43:21 PM By: Montey Hora Entered By: Montey Hora on 01/16/2019 16:43:20 Deschepper, [name] ([id number]) -------------------------------------------------------------------------------- Lower Extremity Assessment Details Patient Name: [name] Date of Service: 01/16/2019 3:30 PM Medical Record Number: [id number] Patient Account Number: [id number] Date of Birth/Sex: July 07, 1920 (85 y.o. F) Treating RN: Army Melia Primary Care Delane Stalling: Ria Bush Other Clinician: Referring Shron Ozer: Ria Bush Treating Rylen Swindler/Extender: STONE III, HOYT Weeks in Treatment: 4 Edema Assessment Assessed: [Left: No] [Right: No] Edema: [Left: N] [Right: o] Vascular Assessment Pulses: Dorsalis Pedis Palpable: [Right:Yes] Electronic Signature(s) Signed: 01/17/2019 11:52:26 AM By: Army Melia Entered By: Army Melia on 01/16/2019 15:44:17 Robards, [name] ([id number]) -------------------------------------------------------------------------------- Multi Wound Chart Details Patient Name: [name] Date of Service: 01/16/2019 3:30 PM Medical Record Number: [id number] Patient Account Number: [id number] Date of Birth/Sex: 03-15-1920 (85 y.o. F) Treating RN: Montey Hora Primary Care Tuyen Uncapher: Ria Bush Other Clinician: Referring Ryu Cerreta: Ria Bush Treating Narek Kniss/Extender: STONE III, HOYT Weeks in Treatment: 4 Vital Signs Height(in): 63 Pulse(bpm): 90 Weight(lbs): 113 Blood Pressure(mmHg): 132/72 Body Mass Index(BMI): 20 Temperature(F):  98.3 Respiratory Rate 16 (breaths/min): Photos: [N/A:N/A] Wound Location: Right Lower Leg - Posterior N/A N/A Wounding Event: Trauma N/A N/A Primary Etiology: Skin Tear N/A N/A Date Acquired: 12/09/2018 N/A N/A Weeks of Treatment: 4 N/A N/A Wound Status: Open N/A N/A Measurements L x W x D 2.5x1x0.1 N/A N/A (cm) Area (cm) : 1.963 N/A N/A Volume (cm) : 0.196 N/A N/A % Reduction in Area: 95.50% N/A N/A % Reduction in Volume: 95.50% N/A N/A Classification: Full Thickness Without N/A N/A Exposed Support Structures Exudate Amount: Medium N/A N/A Exudate Type: Sanguinous  N/A N/A Exudate Color: red N/A N/A Wound Margin: Flat and Intact N/A N/A Granulation Amount: Medium (34-66%) N/A N/A Granulation Quality: Red N/A N/A Necrotic Amount: Medium (34-66%) N/A N/A Necrotic Tissue: Eschar, Adherent Slough N/A N/A Exposed Structures: Fat Layer (Subcutaneous N/A N/A Tissue) Exposed: Yes Fascia: No Tendon: No Muscle: No Joint: No Bone: No Casamento, Deseri W. ([id number]) Epithelialization: None N/A N/A Treatment Notes Electronic Signature(s) Signed: 01/16/2019 4:45:05 PM By: Montey Hora Entered By: Montey Hora on 01/16/2019 16:18:56 Kipper, [name] ([id number]) -------------------------------------------------------------------------------- [facility] [employer] Details Patient Name: [name] Date of Service: 01/16/2019 3:30 PM Medical Record Number: [id number] Patient Account Number: [id number] Date of Birth/Sex: [birth year] (85 y.o. F) Treating RN: Montey Hora Primary Care Dee Paden: Ria Bush Other Clinician: Referring Rilei Kravitz: Ria Bush Treating Breyer Tejera/Extender: Melburn Hake, HOYT Weeks in Treatment: 4 Active Inactive Abuse / Safety / Falls / Self Care Management Nursing Diagnoses: Potential for falls Goals: Patient will not experience any injury related to falls Date Initiated: 12/15/2018 Target Resolution Date: 03/03/2019 Goal  Status: Active Interventions: Assess fall risk on admission and as needed Notes: Orientation to the Wound Care Program Nursing Diagnoses: Knowledge deficit related to the wound healing center program Goals: Patient/caregiver will verbalize understanding of the [facility] Date Initiated: 12/15/2018 Target Resolution Date: 03/03/2019 Goal Status: Active Interventions: Provide education on orientation to the wound center Notes: Wound/Skin Impairment Nursing Diagnoses: Impaired tissue integrity Goals: Ulcer/skin breakdown will heal within 14 weeks Date Initiated: 12/15/2018 Target Resolution Date: 03/03/2019 Goal Status: Active Interventions: Assess patient/caregiver ability to obtain necessary supplies Friedli, Kirat W. ([id number]) Assess patient/caregiver ability to perform ulcer/skin care regimen upon admission and as needed Assess ulceration(s) every visit Notes: Electronic Signature(s) Signed: 01/16/2019 4:45:05 PM By: Montey Hora Entered By: Montey Hora on 01/16/2019 16:18:49 Teater, [name] ([id number]) -------------------------------------------------------------------------------- Pain Assessment Details Patient Name: [name] Date of Service: 01/16/2019 3:30 PM Medical Record Number: [id number] Patient Account Number: [id number] Date of Birth/Sex: [birth year] (85 y.o. F) Treating RN: Montey Hora Primary Care Maat Kafer: Ria Bush Other Clinician: Referring Tao Satz: Ria Bush Treating Demetrius Mahler/Extender: Melburn Hake, HOYT Weeks in Treatment: 4 Active Problems Location of Pain Severity and Description of Pain Patient Has Paino No Site Locations Pain Management and Medication Current Pain Management: Electronic Signature(s) Signed: 01/16/2019 3:55:13 PM By: Paulla Fore, RRT, CHT Signed: 01/16/2019 4:45:05 PM By: Montey Hora Entered By: Lorine Bears on 01/16/2019  15:42:01 Verhoeven, [name] ([id number]) -------------------------------------------------------------------------------- Patient/Caregiver Education Details Patient Name: [name] Date of Service: 01/16/2019 3:30 PM Medical Record Number: [id number] Patient Account Number: [id number] Date of Birth/Gender: [birth year] (85 y.o. F) Treating RN: Montey Hora Primary Care Physician: Ria Bush Other Clinician: Referring Physician: Ria Bush Treating Physician/Extender: Sharalyn Ink in Treatment: 4 Education Assessment Education Provided To: Patient and Caregiver Education Topics Provided Wound/Skin Impairment: Handouts: Other: wound care s ordered Methods: Explain/Verbal Responses: State content correctly Electronic Signature(s) Signed: 01/16/2019 4:45:05 PM By: Montey Hora Entered By: Montey Hora on 01/16/2019 16:42:37 Mistry, [name] ([id number]) -------------------------------------------------------------------------------- Wound Assessment Details Patient Name: [name] Date of Service: 01/16/2019 3:30 PM Medical Record Number: [id number] Patient Account Number: [id number] Date of Birth/Sex: 1920-08-28 (85 y.o. F) Treating RN: Army Melia Primary Care Yaffa Seckman: Ria Bush Other Clinician: Referring Roxie Kreeger: Ria Bush Treating Daylene Vandenbosch/Extender: STONE III, HOYT Weeks in Treatment: 4 Wound Status Wound Number: 3 Primary Etiology: Skin Tear Wound Location: Right Lower Leg - Posterior Wound Status: Open Wounding Event: Trauma Date Acquired:  12/09/2018 Weeks Of Treatment: 4 Clustered Wound: No Photos Wound Measurements Length: (cm) 2.5 Width: (cm) 1 Depth: (cm) 0.1 Area: (cm) 1.963 Volume: (cm) 0.196 % Reduction in Area: 95.5% % Reduction in Volume: 95.5% Epithelialization: None Tunneling: No Undermining: No Wound Description Full Thickness Without Exposed Support Foul  Od Classification: Structures Slough/ Wound Margin: Flat and Intact Exudate Medium Amount: Exudate Type: Sanguinous Exudate Color: red or After Cleansing: No Fibrino Yes Wound Bed Granulation Amount: Medium (34-66%) Exposed Structure Granulation Quality: Red Fascia Exposed: No Necrotic Amount: Medium (34-66%) Fat Layer (Subcutaneous Tissue) Exposed: Yes Necrotic Quality: Eschar, Adherent Slough Tendon Exposed: No Muscle Exposed: No Joint Exposed: No Bone Exposed: No Raimer, Jesyka W. ([id number]) Treatment Notes Wound #3 (Right, Posterior Lower Leg) Notes xeroform, abd, conform and tubigrip F Electronic Signature(s) Signed: 01/17/2019 11:52:26 AM By: Army Melia Entered By: Army Melia on 01/16/2019 15:44:02 Fels, [name] ([id number]) -------------------------------------------------------------------------------- Vitals Details Patient Name: [name] Date of Service: 01/16/2019 3:30 PM Medical Record Number: [id number] Patient Account Number: [id number] Date of Birth/Sex: 01/15/1921 (85 y.o. F) Treating RN: Montey Hora Primary Care Roshawna Colclasure: Ria Bush Other Clinician: Referring Jakhiya Brower: Ria Bush Treating Shakela Donati/Extender: STONE III, HOYT Weeks in Treatment: 4 Vital Signs Time Taken: 15:40 Temperature (F): 98.3 Height (in): 63 Pulse (bpm): 90 Weight (lbs): 113 Respiratory Rate (breaths/min): 16 Body Mass Index (BMI): 20 Blood Pressure (mmHg): 132/72 Reference Range: 80 - 120 mg / dl Electronic Signature(s) Signed: 01/16/2019 3:55:13 PM By: Lorine Bears RCP, RRT, CHT Entered By: Lorine Bears on 01/16/2019 15:42:34

## 2019-01-30 ENCOUNTER — Encounter: Payer: Medicare Other | Attending: Physician Assistant | Admitting: Physician Assistant

## 2019-01-30 ENCOUNTER — Other Ambulatory Visit: Payer: Self-pay

## 2019-01-30 DIAGNOSIS — S51802A Unspecified open wound of left forearm, initial encounter: Secondary | ICD-10-CM | POA: Insufficient documentation

## 2019-01-30 DIAGNOSIS — N183 Chronic kidney disease, stage 3 unspecified: Secondary | ICD-10-CM | POA: Insufficient documentation

## 2019-01-30 DIAGNOSIS — X58XXXA Exposure to other specified factors, initial encounter: Secondary | ICD-10-CM | POA: Diagnosis not present

## 2019-01-30 DIAGNOSIS — I130 Hypertensive heart and chronic kidney disease with heart failure and stage 1 through stage 4 chronic kidney disease, or unspecified chronic kidney disease: Secondary | ICD-10-CM | POA: Diagnosis not present

## 2019-01-30 DIAGNOSIS — F0390 Unspecified dementia without behavioral disturbance: Secondary | ICD-10-CM | POA: Diagnosis not present

## 2019-01-30 DIAGNOSIS — S81801A Unspecified open wound, right lower leg, initial encounter: Secondary | ICD-10-CM | POA: Insufficient documentation

## 2019-01-30 DIAGNOSIS — I5032 Chronic diastolic (congestive) heart failure: Secondary | ICD-10-CM | POA: Insufficient documentation

## 2019-01-30 DIAGNOSIS — S41102A Unspecified open wound of left upper arm, initial encounter: Secondary | ICD-10-CM | POA: Diagnosis not present

## 2019-01-30 DIAGNOSIS — L97212 Non-pressure chronic ulcer of right calf with fat layer exposed: Secondary | ICD-10-CM | POA: Diagnosis not present

## 2019-01-30 NOTE — Progress Notes (Addendum)
DAHLILA, HRIVNAK ([id number]) Visit Report for 01/30/2019 Chief Complaint Document Details Patient Name: [name], [name]. Date of Service: 01/30/2019 2:00 PM Medical Record Number: [id number] Patient Account Number: [id number] Date of Birth/Sex: 12/02/1920 (86 y.o. F) Treating RN: Montey Hora Primary Care Provider: Ria Bush Other Clinician: Referring Provider: Ria Bush Treating Provider/Extender: Melburn Hake, [name] Weeks in Treatment: 6 Information Obtained from: Patient Chief Complaint Left arm, left forearm, and right LE skin tears Electronic Signature(s) Signed: 01/30/2019 2:16:18 PM By: Worthy Keeler PA-C Entered By: Worthy Keeler on 01/30/2019 14:16:18 Yackel, [name] ([id number]) -------------------------------------------------------------------------------- HPI Details Patient Name: [name] Date of Service: 01/30/2019 2:00 PM Medical Record Number: [id number] Patient Account Number: [id number] Date of Birth/Sex: [birth year] (86 y.o. F) Treating RN: Montey Hora Primary Care Provider: Ria Bush Other Clinician: Referring Provider: Ria Bush Treating Provider/Extender: Melburn Hake, [name] Weeks in Treatment: 6 History of Present Illness HPI Description: 12/15/2018 on evaluation today patient presents for initial evaluation here in our clinic concerning issues that she has been having at multiple locations with skin tears. She had a fall roughly 2 weeks ago where she injured her left upper arm and forearm at that time. Subsequently she ended up as well with a skin tear to her leg when her family members were trying to get her into the bed her skin is so fragile that as she was lifting her legs the skin on the back of the leg tore. That is on the right. Nonetheless they have been trying to manage these I did see her primary care provider earlier in the week who referred her to [name] for further evaluation and treatment. Currently there does  not appear to be any signs of active infection at this time which is good news at any site. No fevers, chills, nausea, vomiting, or diarrhea. She does have pain but this is mainly on the leg ulcer the arms are really not to painful at this point. The patient does have a history of dementia, hypertension, congestive heart failure, and chronic kidney disease stage III. 12/29/2018 on evaluation today patient appears to be doing better in regard to her skin tears on the forearm both are healing quite nicely one is close the other is slightly hyper granulated which is probably preventing complete epithelization. With regard to her right lower extremity unfortunately she is still having a lot of dressing material/alginate which is stuck to the wound bed. This seems to be a result of unfortunately having a different type of alginate than what we actually ordered for her. We had ordered silver cell as this was more nonstick unfortunately she received a different alginate which is breaking down and getting somewhat matted into the wound. In fact I would have to try to get some of this off today if at all possible. Other than that she seems to actually be healing nicely she has much less drainage than previously noted. 01/02/19 on evaluation today patient appears to be doing well with regard to her left forearm and right lower extremity ulcers. The left forearm appears to be completely healed which is great news the right leg is doing much better that there's still some eschar/dressing material stuck to the wound it appears to be healing underneath quite well. The Xeroform did much better for her. 01/09/2019 on evaluation today patient appears to be doing very well with regard to her right lower extremity ulcer. She has been tolerating the dressing changes without complication. Unfortunately she still has a  lot of the alginate dressing stuck to the wound bed that somewhat mad again. It looks like there may be  some healing underneath some of these areas but nonetheless I feel like we really need to try to remove that today. This was discussed with the patient's daughter as well. The good news is the patient is really not having a lot of pain. 01/16/2019 upon evaluation today patient appears to be doing well with regard to her wound. In fact this is measuring quite a bit smaller and overall I am very pleased with how things seem to be progressing. The patient seems to be doing well although she does have dementia her family member who is with her today is very pleased with how things seem to be progressing. 01/30/2019 on evaluation today patient appears to be doing excellent in regard to her ulcer. She has been tolerating the dressing changes without complication. Fortunately there is no signs of anything remaining open at this point which is excellent. Electronic Signature(s) Signed: 01/30/2019 3:10:17 PM By: Worthy Keeler PA-C Entered By: Worthy Keeler on 01/30/2019 15:10:17 Staton, [name] ([id number]) -------------------------------------------------------------------------------- Physical Exam Details Patient Name: [name] Date of Service: 01/30/2019 2:00 PM Medical Record Number: [id number] Patient Account Number: [id number] Date of Birth/Sex: 1920/11/07 (86 y.o. F) Treating RN: Montey Hora Primary Care Provider: Ria Bush Other Clinician: Referring Provider: Ria Bush Treating Provider/Extender: STONE III, Orie Baxendale Weeks in Treatment: 6 Constitutional Well-nourished and well-hydrated in no acute distress. Respiratory normal breathing without difficulty. Psychiatric Patient is not able to cooperate in decision making regarding care. Patient has dementia. pleasant and cooperative. Notes Patient's wound showed signs of complete epithelization at this point which is great news. Overall I am extremely pleased and I am hopeful that the patient will continue to do  well without having any complications. I do not think she is going to require any additional follow-up appointments here in the clinic. Electronic Signature(s) Signed: 01/30/2019 3:10:49 PM By: Worthy Keeler PA-C Entered By: Worthy Keeler on 01/30/2019 15:10:49 Blough, [name] ([id number]) -------------------------------------------------------------------------------- Physician Orders Details Patient Name: [name] Date of Service: 01/30/2019 2:00 PM Medical Record Number: [id number] Patient Account Number: [id number] Date of Birth/Sex: 12/11/1920 (86 y.o. F) Treating RN: Montey Hora Primary Care Provider: Ria Bush Other Clinician: Referring Provider: Ria Bush Treating Provider/Extender: Melburn Hake, Denessa Cavan Weeks in Treatment: 6 Verbal / Phone Orders: No Diagnosis Coding ICD-10 Coding Code Description [id number] Unspecified open wound of left upper arm, initial encounter S51.802A Unspecified open wound of left forearm, initial encounter S81.801A Unspecified open wound, right lower leg, initial encounter F03.90 Unspecified dementia without behavioral disturbance I10 Essential (primary) hypertension 99991111 Chronic diastolic (congestive) heart failure N18.30 Chronic kidney disease, stage 3 unspecified Discharge From [facility] o Discharge from [place] Signature(s) Signed: 01/30/2019 4:56:10 PM By: Montey Hora Signed: 01/31/2019 6:44:22 PM By: Worthy Keeler PA-C Entered By: Montey Hora on 01/30/2019 15:08:26 Aeschliman, [name] ([id number]) -------------------------------------------------------------------------------- Problem List Details Patient Name: [name] Date of Service: 01/30/2019 2:00 PM Medical Record Number: [id number] Patient Account Number: [id number] Date of Birth/Sex: 08/14/1920 (86 y.o. F) Treating RN: Montey Hora Primary Care Provider: Ria Bush Other Clinician: Referring Provider: Ria Bush Treating Provider/Extender: Melburn Hake, Anand Tejada Weeks in Treatment: 6 Active Problems ICD-10 Evaluated Encounter Code Description Active Date Today Diagnosis S41.102A Unspecified open wound of left upper arm, initial encounter 12/15/2018 No Yes S51.802A Unspecified open wound of left forearm, initial encounter 12/15/2018  No Yes S81.801A Unspecified open wound, right lower leg, initial encounter 12/15/2018 No Yes F03.90 Unspecified dementia without behavioral disturbance 12/15/2018 No Yes I10 Essential (primary) hypertension 12/15/2018 No Yes 99991111 Chronic diastolic (congestive) heart failure 12/15/2018 No Yes N18.30 Chronic kidney disease, stage 3 unspecified 12/15/2018 No Yes Inactive Problems Resolved Problems Electronic Signature(s) Signed: 01/30/2019 2:16:12 PM By: Worthy Keeler PA-C Entered By: Worthy Keeler on 01/30/2019 14:16:12 Rubey, [name] ([id number]) -------------------------------------------------------------------------------- Progress Note Details Patient Name: [name] Date of Service: 01/30/2019 2:00 PM Medical Record Number: [id number] Patient Account Number: [id number] Date of Birth/Sex: 1920-06-03 (86 y.o. F) Treating RN: Montey Hora Primary Care Provider: Ria Bush Other Clinician: Referring Provider: Ria Bush Treating Provider/Extender: Melburn Hake, [name] Weeks in Treatment: 6 Subjective Chief Complaint Information obtained from Patient Left arm, left forearm, and right LE skin tears History of Present Illness (HPI) 12/15/2018 on evaluation today patient presents for initial evaluation here in our clinic concerning issues that she has been having at multiple locations with skin tears. She had a fall roughly 2 weeks ago where she injured her left upper arm and forearm at that time. Subsequently she ended up as well with a skin tear to her leg when her family members were trying to get her into the bed her skin is so fragile  that as she was lifting her legs the skin on the back of the leg tore. That is on the right. Nonetheless they have been trying to manage these I did see her primary care provider earlier in the week who referred her to [name] for further evaluation and treatment. Currently there does not appear to be any signs of active infection at this time which is good news at any site. No fevers, chills, nausea, vomiting, or diarrhea. She does have pain but this is mainly on the leg ulcer the arms are really not to painful at this point. The patient does have a history of dementia, hypertension, congestive heart failure, and chronic kidney disease stage III. 12/29/2018 on evaluation today patient appears to be doing better in regard to her skin tears on the forearm both are healing quite nicely one is close the other is slightly hyper granulated which is probably preventing complete epithelization. With regard to her right lower extremity unfortunately she is still having a lot of dressing material/alginate which is stuck to the wound bed. This seems to be a result of unfortunately having a different type of alginate than what we actually ordered for her. We had ordered silver cell as this was more nonstick unfortunately she received a different alginate which is breaking down and getting somewhat matted into the wound. In fact I would have to try to get some of this off today if at all possible. Other than that she seems to actually be healing nicely she has much less drainage than previously noted. 01/02/19 on evaluation today patient appears to be doing well with regard to her left forearm and right lower extremity ulcers. The left forearm appears to be completely healed which is great news the right leg is doing much better that there's still some eschar/dressing material stuck to the wound it appears to be healing underneath quite well. The Xeroform did much better for her. 01/09/2019 on evaluation today patient  appears to be doing very well with regard to her right lower extremity ulcer. She has been tolerating the dressing changes without complication. Unfortunately she still has a lot of the alginate dressing stuck to  the wound bed that somewhat mad again. It looks like there may be some healing underneath some of these areas but nonetheless I feel like we really need to try to remove that today. This was discussed with the patient's daughter as well. The good news is the patient is really not having a lot of pain. 01/16/2019 upon evaluation today patient appears to be doing well with regard to her wound. In fact this is measuring quite a bit smaller and overall I am very pleased with how things seem to be progressing. The patient seems to be doing well although she does have dementia her family member who is with her today is very pleased with how things seem to be progressing. 01/30/2019 on evaluation today patient appears to be doing excellent in regard to her ulcer. She has been tolerating the dressing changes without complication. Fortunately there is no signs of anything remaining open at this point which is excellent. [name], [name] ([id number]) Objective Constitutional Well-nourished and well-hydrated in no acute distress. Vitals Time Taken: 2:08 PM, Height: 63 in, Weight: 113 lbs, BMI: 20, Temperature: 98.0 F, Pulse: 84 bpm, Respiratory Rate: 16 breaths/min, Blood Pressure: 118/58 mmHg. Respiratory normal breathing without difficulty. Psychiatric Patient is not able to cooperate in decision making regarding care. Patient has dementia. pleasant and cooperative. General Notes: Patient's wound showed signs of complete epithelization at this point which is great news. Overall I am extremely pleased and I am hopeful that the patient will continue to do well without having any complications. I do not think she is going to require any additional follow-up appointments here in the  clinic. Integumentary (Hair, Skin) Wound #3 status is Healed - Epithelialized. Original cause of wound was Trauma. The wound is located on the Right,Posterior Lower Leg. The wound measures 0cm length x 0cm width x 0cm depth; 0cm^2 area and 0cm^3 volume. There is Fat Layer (Subcutaneous Tissue) Exposed exposed. There is a medium amount of sanguinous drainage noted. The wound margin is flat and intact. There is medium (34-66%) red granulation within the wound bed. There is a medium (34- 66%) amount of necrotic tissue within the wound bed including Eschar and Adherent Slough. Assessment Active Problems ICD-10 Unspecified open wound of left upper arm, initial encounter Unspecified open wound of left forearm, initial encounter Unspecified open wound, right lower leg, initial encounter Unspecified dementia without behavioral disturbance Essential (primary) hypertension Chronic diastolic (congestive) heart failure Chronic kidney disease, stage 3 unspecified Plan Discharge From [facility] Services: Discharge from [place] ([id number]) 1. I would recommend that we discontinue wound care services at this time as the patient is completely healed. 2. I would recommend as well the patient continue to wear a long sock or else Tubigrip in order to help with protecting the area as it is good to be obviously a little bit more fragile until this more effectively heals. That was discussed with the patient's daughter today. We will see the patient back for follow-up visit as needed. Electronic Signature(s) Signed: 01/30/2019 3:11:52 PM By: Worthy Keeler PA-C Entered By: Worthy Keeler on 01/30/2019 15:11:51 Boxer, [name] ([id number]) -------------------------------------------------------------------------------- SuperBill Details Patient Name: [name] Date of Service: 01/30/2019 Medical Record Number: [id number] Patient Account Number: [id number] Date of Birth/Sex:  April 26, 1920 (86 y.o. F) Treating RN: Montey Hora Primary Care Provider: Ria Bush Other Clinician: Referring Provider: Ria Bush Treating Provider/Extender: STONE III, Myrikal Messmer Weeks in Treatment: 6 Diagnosis Coding ICD-10 Codes Code Description S41.102A Unspecified  open wound of left upper arm, initial encounter S51.802A Unspecified open wound of left forearm, initial encounter S81.801A Unspecified open wound, right lower leg, initial encounter F03.90 Unspecified dementia without behavioral disturbance I10 Essential (primary) hypertension [id number] Chronic diastolic (congestive) heart failure N18.30 Chronic kidney disease, stage 3 unspecified Facility Procedures CPT4 Code: [id number] Description: [phone] - WOUND CARE VISIT-LEV 2 EST PT Modifier: Quantity: 1 Physician Procedures CPT4 Code: [id number] Description: [id number] - WC PHYS LEVEL 2 - EST PT ICD-10 Diagnosis Description S41.102A Unspecified open wound of left upper arm, initial encount S51.802A Unspecified open wound of left forearm, initial encounter S81.801A Unspecified open wound, right lower  leg, initial encounte F03.90 Unspecified dementia without behavioral disturbance Modifier: er r Quantity: 1 Electronic Signature(s) Signed: 01/30/2019 4:56:10 PM By: Montey Hora Signed: 01/31/2019 6:44:22 PM By: Worthy Keeler PA-C Previous Signature: 01/30/2019 3:12:11 PM Version By: Worthy Keeler PA-C Entered By: Montey Hora on 01/30/2019 15:22:50

## 2019-01-30 NOTE — Progress Notes (Signed)
[name], [name] ([id number]) Visit Report for 01/30/2019 Arrival Information Details Patient Name: [name] Date of Service: 01/30/2019 2:00 PM Medical Record Number: [id number] Patient Account Number: [id number] Date of Birth/Sex: [birth year] (86 y.o. F) Treating RN: Montey Hora Primary Care Brison Fiumara: Ria Bush Other Clinician: Referring Jerelle Virden: Ria Bush Treating Comer Devins/Extender: Melburn Hake, HOYT Weeks in Treatment: 6 Visit Information History Since Last Visit Added or deleted any medications: No Patient Arrived: [name] Any new allergies or adverse reactions: No Arrival Time: 14:06 Had a fall or experienced change in No Accompanied By: daughter activities of daily living that may affect Transfer Assistance: Manual risk of falls: Patient Identification Verified: Yes Signs or symptoms of abuse/neglect since last visito No Secondary Verification Process Completed: Yes Hospitalized since last visit: No Implantable device outside of the clinic excluding No cellular tissue based products placed in the center since last visit: Has Dressing in Place as Prescribed: Yes Pain Present Now: No Electronic Signature(s) Signed: 01/30/2019 3:01:49 PM By: Lorine Bears RCP, RRT, CHT Entered By: Lorine Bears on 01/30/2019 14:06:44 [name] ([id number]) -------------------------------------------------------------------------------- Clinic Level of Care Assessment Details Patient Name: [name] Date of Service: 01/30/2019 2:00 PM Medical Record Number: [id number] Patient Account Number: [id number] Date of Birth/Sex: 1920-11-24 (86 y.o. F) Treating RN: Montey Hora Primary Care Delight Bickle: Ria Bush Other Clinician: Referring Maddelyn Rocca: Ria Bush Treating Kaiden Dardis/Extender: Melburn Hake, HOYT Weeks in Treatment: 6 Clinic Level of Care Assessment Items TOOL 4 Quantity Score []  - Use when only an EandM is  performed on FOLLOW-UP visit 0 ASSESSMENTS - Nursing Assessment / Reassessment X - Reassessment of Co-morbidities (includes updates in patient status) 1 10 X- 1 5 Reassessment of Adherence to Treatment Plan ASSESSMENTS - Wound and Skin Assessment / Reassessment X - Simple Wound Assessment / Reassessment - one wound 1 5 []  - 0 Complex Wound Assessment / Reassessment - multiple wounds []  - 0 Dermatologic / Skin Assessment (not related to wound area) ASSESSMENTS - Focused Assessment []  - Circumferential Edema Measurements - multi extremities 0 []  - 0 Nutritional Assessment / Counseling / Intervention []  - 0 Lower Extremity Assessment (monofilament, tuning fork, pulses) []  - 0 Peripheral Arterial Disease Assessment (using hand held doppler) ASSESSMENTS - Ostomy and/or Continence Assessment and Care []  - Incontinence Assessment and Management 0 []  - 0 Ostomy Care Assessment and Management (repouching, etc.) PROCESS - Coordination of Care X - Simple Patient / Family Education for ongoing care 1 15 []  - 0 Complex (extensive) Patient / Family Education for ongoing care X- 1 10 Staff obtains Programmer, systems, Records, Test Results / Process Orders []  - 0 Staff telephones HHA, Nursing Homes / Clarify orders / etc []  - 0 Routine Transfer to another Facility (non-emergent condition) []  - 0 Routine Hospital Admission (non-emergent condition) []  - 0 New Admissions / Biomedical engineer / Ordering NPWT, Apligraf, etc. []  - 0 Emergency Hospital Admission (emergent condition) X- 1 10 Simple Discharge Coordination [name], [name] ([id number]) []  - 0 Complex (extensive) Discharge Coordination PROCESS - Special Needs []  - Pediatric / Minor Patient Management 0 []  - 0 Isolation Patient Management []  - 0 Hearing / Language / Visual special needs []  - 0 Assessment of Community assistance (transportation, D/C planning, etc.) []  - 0 Additional assistance / Altered mentation []  - 0 Support  Surface(s) Assessment (bed, cushion, seat, etc.) INTERVENTIONS - Wound Cleansing / Measurement X - Simple Wound Cleansing - one wound 1 5 []  - 0 Complex Wound Cleansing - multiple wounds  X- 1 5 Wound Imaging (photographs - any number of wounds) []  - 0 Wound Tracing (instead of photographs) X- 1 5 Simple Wound Measurement - one wound []  - 0 Complex Wound Measurement - multiple wounds INTERVENTIONS - Wound Dressings []  - Small Wound Dressing one or multiple wounds 0 []  - 0 Medium Wound Dressing one or multiple wounds []  - 0 Large Wound Dressing one or multiple wounds []  - 0 Application of Medications - topical []  - 0 Application of Medications - injection INTERVENTIONS - Miscellaneous []  - External ear exam 0 []  - 0 Specimen Collection (cultures, biopsies, blood, body fluids, etc.) []  - 0 Specimen(s) / Culture(s) sent or taken to Lab for analysis []  - 0 Patient Transfer (multiple staff / Civil Service fast streamer / Similar devices) []  - 0 Simple Staple / Suture removal (25 or less) []  - 0 Complex Staple / Suture removal (26 or more) []  - 0 Hypo / Hyperglycemic Management (close monitor of Blood Glucose) []  - 0 Ankle / Brachial Index (ABI) - do not check if billed separately X- 1 5 Vital Signs [name], [name] ([id number]) Has the patient been seen at the hospital within the last three years: Yes Total Score: 75 Level Of Care: New/Established - Level 2 Electronic Signature(s) Signed: 01/30/2019 4:56:10 PM By: Montey Hora Entered By: Montey Hora on 01/30/2019 15:22:35 [name] ([id number]) -------------------------------------------------------------------------------- Encounter Discharge Information Details Patient Name: [name] Date of Service: 01/30/2019 2:00 PM Medical Record Number: [id number] Patient Account Number: [id number] Date of Birth/Sex: 16-Sep-1920 (86 y.o. F) Treating RN: Montey Hora Primary Care Adrinne Sze: Ria Bush Other  Clinician: Referring Raeven Cook: Ria Bush Treating Marline Morace/Extender: Melburn Hake, HOYT Weeks in Treatment: 6 Encounter Discharge Information Items Discharge Condition: Stable Ambulatory Status: Ambulatory Discharge Destination: Home Transportation: Private Auto Accompanied By: daughter Schedule Follow-up Appointment: No Clinical Summary of Care: Electronic Signature(s) Signed: 01/30/2019 4:56:10 PM By: Montey Hora Entered By: Montey Hora on 01/30/2019 15:23:22 [name] ([id number]) -------------------------------------------------------------------------------- Lower Extremity Assessment Details Patient Name: [name] Date of Service: 01/30/2019 2:00 PM Medical Record Number: [id number] Patient Account Number: [id number] Date of Birth/Sex: [birth year] (86 y.o. F) Treating RN: Army Melia Primary Care Lariah Fleer: Ria Bush Other Clinician: Referring Jaylina Ramdass: Ria Bush Treating Vin Yonke/Extender: STONE III, HOYT Weeks in Treatment: 6 Edema Assessment Assessed: [Left: No] [Right: No] Edema: [Left: N] [Right: o] Vascular Assessment Pulses: Dorsalis Pedis Palpable: [Right:Yes] Electronic Signature(s) Signed: 01/30/2019 4:03:57 PM By: Army Melia Entered By: Army Melia on 01/30/2019 14:13:54 Dickison, Waynard Cook ([id number]) -------------------------------------------------------------------------------- Multi Wound Chart Details Patient Name: [name] Date of Service: 01/30/2019 2:00 PM Medical Record Number: [id number] Patient Account Number: [id number] Date of Birth/Sex: 10-Aug-1920 (86 y.o. F) Treating RN: Montey Hora Primary Care Nawaal Alling: Ria Bush Other Clinician: Referring Mckinzey Entwistle: Ria Bush Treating Erminie Foulks/Extender: STONE III, HOYT Weeks in Treatment: 6 Vital Signs Height(in): 63 Pulse(bpm): 84 Weight(lbs): 113 Blood Pressure(mmHg): 118/58 Body Mass Index(BMI): 20 Temperature(F):  98.0 Respiratory Rate 16 (breaths/min): Photos: [N/A:N/A] Wound Location: Right, Posterior Lower Leg N/A N/A Wounding Event: Trauma N/A N/A Primary Etiology: Skin Tear N/A N/A Date Acquired: 12/09/2018 N/A N/A Weeks of Treatment: 6 N/A N/A Wound Status: Healed - Epithelialized N/A N/A Measurements L x W x D 0x0x0 N/A N/A (cm) Area (cm) : 0 N/A N/A Volume (cm) : 0 N/A N/A % Reduction in Area: 100.00% N/A N/A % Reduction in Volume: 100.00% N/A N/A Classification: Full Thickness Without N/A N/A Exposed Support Structures Exudate Amount: Medium N/A N/A Exudate Type:  Sanguinous N/A N/A Exudate Color: red N/A N/A Wound Margin: Flat and Intact N/A N/A Granulation Amount: Medium (34-66%) N/A N/A Granulation Quality: Red N/A N/A Necrotic Amount: Medium (34-66%) N/A N/A Necrotic Tissue: Eschar, Adherent Slough N/A N/A Exposed Structures: Fat Layer (Subcutaneous N/A N/A Tissue) Exposed: Yes Fascia: No Tendon: No Muscle: No Joint: No Bone: No Morro, Janeen W. ([id number]) Epithelialization: None N/A N/A Treatment Notes Electronic Signature(s) Signed: 01/30/2019 4:56:10 PM By: Montey Hora Entered By: Montey Hora on 01/30/2019 15:22:14 Valbuena, Waynard Cook ([id number]) -------------------------------------------------------------------------------- Multi-Disciplinary Care Plan Details Patient Name: [name] Date of Service: 01/30/2019 2:00 PM Medical Record Number: [id number] Patient Account Number: [id number] Date of Birth/Sex: 20-Jul-1920 (86 y.o. F) Treating RN: Montey Hora Primary Care Gill Delrossi: Ria Bush Other Clinician: Referring Samantha Olivera: Ria Bush Treating Kashara Blocher/Extender: Melburn Hake, HOYT Weeks in Treatment: 6 Active Inactive Electronic Signature(s) Signed: 01/30/2019 4:56:10 PM By: Montey Hora Entered By: Montey Hora on 01/30/2019 15:22:08 [name]  ([id number]) -------------------------------------------------------------------------------- Pain Assessment Details Patient Name: [name] Date of Service: 01/30/2019 2:00 PM Medical Record Number: [id number] Patient Account Number: [id number] Date of Birth/Sex: [birth year] (86 y.o. F) Treating RN: Montey Hora Primary Care Evertt Chouinard: Ria Bush Other Clinician: Referring Shakemia Madera: Ria Bush Treating Lela Murfin/Extender: Melburn Hake, HOYT Weeks in Treatment: 6 Active Problems Location of Pain Severity and Description of Pain Patient Has Paino No Site Locations Pain Management and Medication Current Pain Management: Electronic Signature(s) Signed: 01/30/2019 3:01:49 PM By: Paulla Fore, RRT, CHT Signed: 01/30/2019 4:56:10 PM By: Montey Hora Entered By: Lorine Bears on 01/30/2019 14:06:59 Roll, Waynard Cook ([id number]) -------------------------------------------------------------------------------- Patient/Caregiver Education Details Patient Name: [name] Date of Service: 01/30/2019 2:00 PM Medical Record Number: [id number] Patient Account Number: [id number] Date of Birth/Gender: 30-Sep-1920 (86 y.o. F) Treating RN: Montey Hora Primary Care Physician: Ria Bush Other Clinician: Referring Physician: Ria Bush Treating Physician/Extender: Sharalyn Ink in Treatment: 6 Education Assessment Education Provided To: Caregiver Education Topics Provided Basic Hygiene: Handouts: Other: skin care Methods: Explain/Verbal Responses: State content correctly Electronic Signature(s) Signed: 01/30/2019 4:56:10 PM By: Montey Hora Entered By: Montey Hora on 01/30/2019 15:23:07 Nygren, Waynard Cook ([id number]) -------------------------------------------------------------------------------- Wound Assessment Details Patient Name: [name] Date of Service: 01/30/2019 2:00 PM Medical Record Number:  [id number] Patient Account Number: [id number] Date of Birth/Sex: 05/08/1920 (86 y.o. F) Treating RN: Montey Hora Primary Care Erick Oxendine: Ria Bush Other Clinician: Referring Aiko Belko: Ria Bush Treating Ryanna Teschner/Extender: STONE III, HOYT Weeks in Treatment: 6 Wound Status Wound Number: 3 Primary Etiology: Skin Tear Wound Location: Right, Posterior Lower Leg Wound Status: Healed - Epithelialized Wounding Event: Trauma Date Acquired: 12/09/2018 Weeks Of Treatment: 6 Clustered Wound: No Photos Wound Measurements Length: (cm) 0 % Reduct Width: (cm) 0 % Reduct Depth: (cm) 0 Epitheli Area: (cm) 0 Volume: (cm) 0 ion in Area: 100% ion in Volume: 100% alization: None Wound Description Full Thickness Without Exposed Support Foul Odo Classification: Structures Slough/F Wound Margin: Flat and Intact Exudate Medium Amount: Exudate Type: Sanguinous Exudate Color: red r After Cleansing: No ibrino Yes Wound Bed Granulation Amount: Medium (34-66%) Exposed Structure Granulation Quality: Red Fascia Exposed: No Necrotic Amount: Medium (34-66%) Fat Layer (Subcutaneous Tissue) Exposed: Yes Necrotic Quality: Eschar, Adherent Slough Tendon Exposed: No Muscle Exposed: No Joint Exposed: No Bone Exposed: No Mehaffey, BRACIE KNEIP ([id number]) Electronic Signature(s) Signed: 01/30/2019 4:56:10 PM By: Montey Hora Entered By: Montey Hora on 01/30/2019 15:08:09 Daddona, Waynard Cook ([id number]) -------------------------------------------------------------------------------- Vitals Details Patient Name: [name]. Date of Service:  01/30/2019 2:00 PM Medical Record Number: [id number] Patient Account Number: [id number] Date of Birth/Sex: [birth year] (86 y.o. F) Treating RN: Montey Hora Primary Care Keenya Matera: Ria Bush Other Clinician: Referring Jerlyn Pain: Ria Bush Treating Shawnie Nicole/Extender: Melburn Hake, HOYT Weeks in Treatment: 6 Vital Signs Time  Taken: 14:08 Temperature (F): 98.0 Height (in): 63 Pulse (bpm): 84 Weight (lbs): 113 Respiratory Rate (breaths/min): 16 Body Mass Index (BMI): 20 Blood Pressure (mmHg): 118/58 Reference Range: 80 - 120 mg / dl Electronic Signature(s) Signed: 01/30/2019 3:01:49 PM By: Lorine Bears RCP, RRT, CHT Entered By: Lorine Bears on 01/30/2019 14:10:21

## 2019-04-24 DIAGNOSIS — D485 Neoplasm of uncertain behavior of skin: Secondary | ICD-10-CM | POA: Diagnosis not present

## 2019-04-24 DIAGNOSIS — L820 Inflamed seborrheic keratosis: Secondary | ICD-10-CM | POA: Diagnosis not present

## 2019-04-24 DIAGNOSIS — C44329 Squamous cell carcinoma of skin of other parts of face: Secondary | ICD-10-CM | POA: Diagnosis not present

## 2019-05-14 DIAGNOSIS — L821 Other seborrheic keratosis: Secondary | ICD-10-CM | POA: Diagnosis not present

## 2019-05-14 DIAGNOSIS — C44329 Squamous cell carcinoma of skin of other parts of face: Secondary | ICD-10-CM | POA: Diagnosis not present

## 2019-06-14 ENCOUNTER — Emergency Department (HOSPITAL_COMMUNITY): Payer: Medicare Other

## 2019-06-14 ENCOUNTER — Emergency Department (HOSPITAL_COMMUNITY)
Admission: EM | Admit: 2019-06-14 | Discharge: 2019-06-14 | Disposition: A | Discharge status: Home / Self Care | Payer: Medicare Other | Attending: Emergency Medicine | Admitting: Emergency Medicine

## 2019-06-14 ENCOUNTER — Other Ambulatory Visit: Payer: Self-pay

## 2019-06-14 ENCOUNTER — Telehealth: Payer: Self-pay | Admitting: Family Medicine

## 2019-06-14 DIAGNOSIS — M79652 Pain in left thigh: Secondary | ICD-10-CM | POA: Diagnosis not present

## 2019-06-14 DIAGNOSIS — W19XXXA Unspecified fall, initial encounter: Secondary | ICD-10-CM | POA: Insufficient documentation

## 2019-06-14 DIAGNOSIS — Z79899 Other long term (current) drug therapy: Secondary | ICD-10-CM | POA: Insufficient documentation

## 2019-06-14 DIAGNOSIS — Y9389 Activity, other specified: Secondary | ICD-10-CM | POA: Diagnosis not present

## 2019-06-14 DIAGNOSIS — T148XXA Other injury of unspecified body region, initial encounter: Secondary | ICD-10-CM | POA: Insufficient documentation

## 2019-06-14 DIAGNOSIS — N183 Chronic kidney disease, stage 3 unspecified: Secondary | ICD-10-CM | POA: Insufficient documentation

## 2019-06-14 DIAGNOSIS — J449 Chronic obstructive pulmonary disease, unspecified: Secondary | ICD-10-CM | POA: Diagnosis not present

## 2019-06-14 DIAGNOSIS — Z8509 Personal history of malignant neoplasm of other digestive organs: Secondary | ICD-10-CM | POA: Insufficient documentation

## 2019-06-14 DIAGNOSIS — F0390 Unspecified dementia without behavioral disturbance: Secondary | ICD-10-CM | POA: Insufficient documentation

## 2019-06-14 DIAGNOSIS — I10 Essential (primary) hypertension: Secondary | ICD-10-CM | POA: Diagnosis not present

## 2019-06-14 DIAGNOSIS — S79911A Unspecified injury of right hip, initial encounter: Secondary | ICD-10-CM | POA: Diagnosis not present

## 2019-06-14 DIAGNOSIS — Y92002 Bathroom of unspecified non-institutional (private) residence single-family (private) house as the place of occurrence of the external cause: Secondary | ICD-10-CM | POA: Insufficient documentation

## 2019-06-14 DIAGNOSIS — R531 Weakness: Secondary | ICD-10-CM | POA: Diagnosis not present

## 2019-06-14 DIAGNOSIS — I5032 Chronic diastolic (congestive) heart failure: Secondary | ICD-10-CM | POA: Insufficient documentation

## 2019-06-14 DIAGNOSIS — E039 Hypothyroidism, unspecified: Secondary | ICD-10-CM | POA: Insufficient documentation

## 2019-06-14 DIAGNOSIS — S41102A Unspecified open wound of left upper arm, initial encounter: Secondary | ICD-10-CM | POA: Diagnosis not present

## 2019-06-14 DIAGNOSIS — M25562 Pain in left knee: Secondary | ICD-10-CM | POA: Diagnosis not present

## 2019-06-14 DIAGNOSIS — Z743 Need for continuous supervision: Secondary | ICD-10-CM | POA: Diagnosis not present

## 2019-06-14 DIAGNOSIS — Y92009 Unspecified place in unspecified non-institutional (private) residence as the place of occurrence of the external cause: Secondary | ICD-10-CM

## 2019-06-14 DIAGNOSIS — Y999 Unspecified external cause status: Secondary | ICD-10-CM | POA: Insufficient documentation

## 2019-06-14 DIAGNOSIS — I130 Hypertensive heart and chronic kidney disease with heart failure and stage 1 through stage 4 chronic kidney disease, or unspecified chronic kidney disease: Secondary | ICD-10-CM | POA: Insufficient documentation

## 2019-06-14 DIAGNOSIS — S79912A Unspecified injury of left hip, initial encounter: Secondary | ICD-10-CM | POA: Diagnosis not present

## 2019-06-14 DIAGNOSIS — S7002XA Contusion of left hip, initial encounter: Secondary | ICD-10-CM | POA: Insufficient documentation

## 2019-06-14 DIAGNOSIS — S8992XA Unspecified injury of left lower leg, initial encounter: Secondary | ICD-10-CM | POA: Diagnosis not present

## 2019-06-14 DIAGNOSIS — S81802A Unspecified open wound, left lower leg, initial encounter: Secondary | ICD-10-CM | POA: Insufficient documentation

## 2019-06-14 DIAGNOSIS — S81801A Unspecified open wound, right lower leg, initial encounter: Secondary | ICD-10-CM | POA: Diagnosis not present

## 2019-06-14 LAB — COMPREHENSIVE METABOLIC PANEL
ALT: 15 U/L (ref 0–44)
AST: 15 U/L (ref 15–41)
Albumin: 3 g/dL — ABNORMAL LOW (ref 3.5–5.0)
Alkaline Phosphatase: 44 U/L (ref 38–126)
Anion gap: 9 (ref 5–15)
BUN: 35 mg/dL — ABNORMAL HIGH (ref 8–23)
CO2: 25 mmol/L (ref 22–32)
Calcium: 9.3 mg/dL (ref 8.9–10.3)
Chloride: 106 mmol/L (ref 98–111)
Creatinine, Ser: 1.63 mg/dL — ABNORMAL HIGH (ref 0.44–1.00)
GFR calc Af Amer: 31 mL/min — ABNORMAL LOW (ref >60)
GFR calc non Af Amer: 26 mL/min — ABNORMAL LOW (ref >60)
Glucose, Bld: 101 mg/dL — ABNORMAL HIGH (ref 70–99)
Potassium: 3.7 mmol/L (ref 3.5–5.1)
Sodium: 140 mmol/L (ref 135–145)
Total Bilirubin: 1.3 mg/dL — ABNORMAL HIGH (ref 0.3–1.2)
Total Protein: 5.7 g/dL — ABNORMAL LOW (ref 6.5–8.1)

## 2019-06-14 LAB — CBC
HCT: 36.9 % (ref 36.0–46.0)
Hemoglobin: 11.7 g/dL — ABNORMAL LOW (ref 12.0–15.0)
MCH: 31.4 pg (ref 26.0–34.0)
MCHC: 31.7 g/dL (ref 30.0–36.0)
MCV: 98.9 fL (ref 80.0–100.0)
Platelets: UNDETERMINED 10*3/uL (ref 150–400)
RBC: 3.73 MIL/uL — ABNORMAL LOW (ref 3.87–5.11)
RDW: 12.6 % (ref 11.5–15.5)
WBC: 10.8 10*3/uL — ABNORMAL HIGH (ref 4.0–10.5)
nRBC: 0 % (ref 0.0–0.2)

## 2019-06-14 NOTE — ED Notes (Signed)
Pt to go home with caregiver daughter Bonnita Nasuti.   Discharge instructions discussed with caregiver. Caregiver was able to verbalize understanding with no questions at this time. Caregiver aware CT was not obtained at this time and we are unable to rule out brain bleed.   Pt wheelchair out to lobby. To go home with caregiver. Home health and PT to follow up with pt.

## 2019-06-14 NOTE — ED Triage Notes (Signed)
Pt here from home after mechanical fall in the bathroom on Tuesday. Pt has no complaints and does not endorse pain, but family reports pt has been favoring the R side since the fall. Pt typically walks with a walker without other assistance but was unable to do so with EMS today. Hx of dementia, oriented to her baseline. Passed EMS stroke screen.

## 2019-06-14 NOTE — ED Notes (Addendum)
CT unable to complete CT scan. Tech states pt was not able to lay flat for scan. Pt stating that "I can't breath." PA Wylder informed and at bedside

## 2019-06-14 NOTE — ED Notes (Signed)
Daughter with patient states she fell on Tuesday in the bathroom and landed on her left side. States they were able to get her to the bathroom yest however last pm she wouldn't walk, states she will not put weight on her left leg. C/o tenderness in her thigh when palpated. Skin tears to left elbow, patient has healing wounds to left lower leg.

## 2019-06-14 NOTE — Care Management (Signed)
ED CM spoke with the patient and her daughter at the bedside. Patient's daughter states the patient lives in a home with her son, granddaughter and her granddaughter's husband. The patient has someone in the home with her 24/7. She states the patient's granddaughter has been encouraged not to leave her alone in the bathroom when she assists her to the bathroom, however, on Tuesday, she did and the patient fell.   The patient has a walker, wheelchair, shower chair and what sounds like 3 in 1 she uses over her toilet. She is agreeable to home health and does not have a preference of home health providers. She agrees for home health to be arranged with whichever agency will accept the patient's insurance.   Daughter reports they have been using the cream/bandages they received several months to a year ago to treat the wounds on her legs and arm.   ED CM spoke with Ronalee Belts at [facility]. He states if they are able to accept the patient for PT and RN, they will be able to see her on Monday 06/18/2019. He will call the ED CM in the morning if they are unable to accept the referral.

## 2019-06-14 NOTE — ED Notes (Signed)
Transported to xray 

## 2019-06-14 NOTE — ED Notes (Signed)
Pt transported to CT ?

## 2019-06-14 NOTE — ED Provider Notes (Signed)
[facility] EMERGENCY DEPARTMENT Provider Note   CSN: [id number] Arrival date & time: 06/14/19  1320     History Chief Complaint  Patient presents with  . Fall    [name] is a 86 y.o. female    HPI patient is a 86 year old female with past medical history significant for remote left hip fracture, gait instability--walks at home with walker and lives at home with her daughter--anemia, CKD, COPD, HTN, HLD, dementia  Level 4 caveat secondary to dementia--able answer yes or no questions Patient history obtained from daughter who is at bedside. Per daughter patient fell earlier in the day on Tuesday, 2 days ago, in the bathroom when she was attempting to turn to sit on the toilet. Daughter was actually in the other room when the fall occurred but heard the patient say "I'm falling "and when she came to the bathroom found her mother lying on the ground. She was able answer questions, not confused and had not passed out. She did not appear to have hit her head or neck. She was not complaining of any head pain, neck pain, chest pain, abdominal pain, faculty breathing or other symptoms however she does state that her left leg/hip was hurting. She has a history of a femoral fracture in this area. Over the next 2 days patient seemed to have progressive worsening of pain in her left hip prompting emergency room visit today.  Patient is able to answer yes/no questions and denies any chest pain, shortness of breath, nausea, vomiting, headache, lightheadedness, dizziness. She states it hurts to walk.    Past Medical History:  Diagnosis Date  . Acute hemorrhagic cystitis 12/04/2014  . Anemia 12/30-01/25/2006   [facility]- CHF- started lasix  . CKD (chronic kidney disease), stage III 03/04/2010  . COPD (chronic obstructive pulmonary disease) ([place]) 02/2014   by CXR and VQ scan  . Diastolic CHF ([place])   . Gross hematuria 12/31/2014  . History of colon cancer    s/p partial colectomy  .  History of ETT 03/15/2006   adenosine myoview low risk  . Hyperlipidemia 12/2002  . Hypertension 10/1998  . Hypothyroidism   . Left displaced femoral neck fracture ([place]) 02/01/2014  . Microhematuria 03/2013   watchful waiting ([place])  . Right lower lobe pneumonia 03/10/2011  . Vaginal delivery    x4, at home    Patient Active Problem List   Diagnosis Date Noted  . Skin tear of left upper arm without complication [id number]  . Leg wound, right, subsequent encounter 12/13/2018  . Fall with injury 12/13/2018  . Tremor of right hand 07/09/2014  . Advanced care planning/counseling discussion 11/12/2013  . Health maintenance examination 11/12/2013  . Senile dementia ([place]) 05/22/2013  . Hypothyroidism   . Medicare annual wellness visit, subsequent 11/08/2011  . Personal history of malignant neoplasm of gastrointestinal tract 06/16/2010  . CKD (chronic kidney disease), stage III 03/04/2010  . Vitamin D deficiency 02/25/2009  . POLYP, COLON 01/09/2007  . Essential hypertension 07/07/2006  . Chronic diastolic CHF (congestive heart failure) ([place]) 05/25/2006  . History of pernicious anemia 01/23/2006    Past Surgical History:  Procedure Laterality Date  . CHOLECYSTECTOMY  1984  . COLONOSCOPY  03/2010   1 tubular adenoma, int hemorrhoids, f/u prn [name]  . CT HEAD LIMITED W/O CM  02/2011   no acute process.  Mild to moderate cortical volume loss and scattered small vessel ischemic microangiopathy  . ESOPHAGOGASTRODUODENOSCOPY     B9 polyp  H.Pylori bx- "dilated" polyps  . HIP ARTHROPLASTY Left 02/01/2014   hemiarthroplasty s/p fem fracture after fall [name], MD)  . History of Abd  02/25/2006   bilat renal cysts, no mets  . PARTIAL COLECTOMY  03/25/2006   right     OB History   No obstetric history on file.     Family History  Problem Relation Age of Onset  . Stroke Sister   . Diabetes Sister   . Hypertension Sister   . Stroke Mother   . Alcohol abuse Brother      Social History   Tobacco Use  . Smoking status: Never Smoker  . Smokeless tobacco: Never Used  Substance Use Topics  . Alcohol use: No  . Drug use: No    Home Medications Prior to Admission medications   Medication Sig Start Date End Date Taking? Authorizing Provider  acetaminophen (TYLENOL) 325 MG tablet Take 2 tablets (650 mg total) by mouth every 6 (six) hours as needed for mild pain or moderate pain (or Fever >/= 101). 02/04/14   Perkins, Alexzandrew L, PA-C  amLODipine (NORVASC) 5 MG tablet TAKE 1 TABLET BY MOUTH EVERY DAY IN THE EVENING 01/15/19   Ria Bush, MD  Cholecalciferol (VITAMIN D3) 1000 UNITS CAPS Take by mouth 2 (two) times daily.      [provider]  Cyanocobalamin (VITAMIN B-12) 500 MCG TABS Take 500 mcg by mouth daily. 12/29/18   Ria Bush, MD  ferrous sulfate 325 (65 FE) MG tablet Take 1 tablet (325 mg total) by mouth every Monday, Wednesday, and Friday. 07/12/15   Ria Bush, MD  furosemide (LASIX) 20 MG tablet TAKE 1/2 TABLET BY MOUTH EVERY MORNING. 01/11/19   Ria Bush, MD  levothyroxine (SYNTHROID) 25 MCG tablet TAKE 1 TABLET BY MOUTH DAILY BEFORE BREAKFAST. 01/16/19   Ria Bush, MD  Multiple Vitamins-Minerals (EYE VITAMINS & MINERALS PO) Take 1 tablet by mouth daily. [facility]    [provider]  omeprazole (PRILOSEC OTC) 20 MG tablet Take 1 tablet (20 mg total) by mouth daily as needed. 07/08/15   Ria Bush, MD    Allergies    Patient has no known allergies.  Review of Systems   Review of Systems  Constitutional: Negative for fever.  HENT: Negative for congestion.   Respiratory: Negative for shortness of breath.   Cardiovascular: Negative for chest pain.  Gastrointestinal: Negative for abdominal distention.  Musculoskeletal:       Left hip/left leg pain  Neurological: Negative for dizziness and headaches.    Physical Exam Updated Vital Signs BP (!) 142/62   Pulse 87   Temp 98.5  F (36.9 C) (Oral)   Resp 18   SpO2 97%   Physical Exam Vitals and nursing note reviewed.  Constitutional:      General: She is not in acute distress.    Comments: Elderly 86 year old female appears stated age, able answer questions that are simple, and follow commands if repeated loudly. She is hard of hearing.  HENT:     Head: Normocephalic and atraumatic.     Nose: Nose normal.     Mouth/Throat:     Mouth: Mucous membranes are moist.  Eyes:     General: No scleral icterus. Cardiovascular:     Rate and Rhythm: Normal rate and regular rhythm.     Pulses: Normal pulses.     Heart sounds: Normal heart sounds.  Pulmonary:     Effort: Pulmonary effort is normal. No respiratory  distress.     Breath sounds: No wheezing.  Abdominal:     Palpations: Abdomen is soft.     Tenderness: There is no abdominal tenderness.  Musculoskeletal:     Cervical back: Normal range of motion.     Right lower leg: No edema.     Left lower leg: No edema.     Comments: Tenderness to palpation of the left hip and left thigh and gluteal region as well as knee. There is no significant tenderness but rather diffuse mild tenderness with deep palpation. Full range of motion of bilateral legs. She is able to lift both legs off the gurney.   No other bony tenderness over joints or long bones of the upper and lower extremities.     No neck or back midline tenderness, step-off, deformity, or bruising. Able to turn head left and right 45 degrees without difficulty.  Full range of motion of upper and lower extremity joints shown after palpation was conducted; with 5/5 symmetrical strength in upper and lower extremities. No chest wall tenderness, no facial or cranial tenderness.    Skin:    General: Skin is warm and dry.     Capillary Refill: Capillary refill takes less than 2 seconds.     Comments: There is a skin tear to the left tricep that is approximately 2 cm in length with no evidence of infection no  redness, erythema, warmth, tenderness to palpation. It has been cleaned well and there is a bandage in place. This was removed and replaced on my examination.  Scattered bruises on upper and lower extremities. Patient has very thin skin and appears to bruise easily.  Neurological:     Mental Status: She is alert. Mental status is at baseline.     Comments: Patient has intact sensation grossly in lower and upper extremities. Intact patellar and ankle reflexes. Patient able to ambulate without difficulty.  Radial and DP pulses palpated BL.   Patient is oriented to self, location, but is hazy on details. She notes that she had a fall but is unable to say when.  Speech is without dysarthria or dysphasia.   Strength 5/5 in upper/lower extremities  Sensation intact in upper/lower extremities   Normal gait.  Negative Romberg. No pronator drift.  Normal finger-to-nose and feet tapping.  CN I not tested  CN II grossly intact visual fields bilaterally. Did not visualize posterior eye.   CN III, IV, VI PERRLA and EOMs intact bilaterally  CN V Intact sensation to sharp and light touch to the face  CN VII facial movements symmetric  CN VIII not tested  CN IX, X no uvula deviation, symmetric rise of soft palate  CN XI 5/5 SCM and trapezius strength bilaterally  CN XII Midline tongue protrusion, symmetric L/R movements   Psychiatric:        Mood and Affect: Mood normal.        Behavior: Behavior normal.     ED Results / Procedures / Treatments   Labs (all labs ordered are listed, but only abnormal results are displayed) Labs Reviewed  COMPREHENSIVE METABOLIC PANEL - Abnormal; Notable for the following components:      Result Value   Glucose, Bld 101 (*)    BUN 35 (*)    Creatinine, Ser 1.63 (*)    Total Protein 5.7 (*)    Albumin 3.0 (*)    Total Bilirubin 1.3 (*)    GFR calc non Af Amer 26 (*)  GFR calc Af Amer 31 (*)    All other components within normal limits  CBC - Abnormal;  Notable for the following components:   WBC 10.8 (*)    RBC 3.73 (*)    Hemoglobin 11.7 (*)    All other components within normal limits  URINALYSIS, ROUTINE W REFLEX MICROSCOPIC    EKG None  Radiology DG Knee Complete 4 Views Left  Result Date: 06/14/2019 CLINICAL DATA:  Status post fall. EXAM: LEFT KNEE - COMPLETE 4+ VIEW COMPARISON:  None. FINDINGS: No evidence of acute fracture, dislocation, or joint effusion. Moderate severity medial and lateral tibiofemoral compartment space narrowing is seen. Soft tissues are unremarkable. IMPRESSION: Moderate severity degenerative changes without evidence of acute fracture. Electronically Signed   By: Virgina Norfolk M.D.   On: 06/14/2019 17:09   DG Hips Bilat W or Wo Pelvis 3-4 Views  Result Date: 06/14/2019 CLINICAL DATA:  Status post fall. EXAM: DG HIP (WITH OR WITHOUT PELVIS) 3-4V BILAT COMPARISON:  None. FINDINGS: There is no evidence of an acute hip fracture or dislocation. A total left hip replacement is seen without evidence of surrounding lucency to suggest the presence of hardware loosening or infection. Moderate severity degenerative changes seen within the visualized portion of the lower lumbar spine. IMPRESSION: 1. No acute osseous abnormality. 2. Intact total left hip replacement. Electronically Signed   By: Virgina Norfolk M.D.   On: 06/14/2019 17:08    Procedures Procedures (including critical care time)  Medications Ordered in ED Medications - No data to display  ED Course  I have reviewed the triage vital signs and the nursing notes.  Pertinent labs & imaging results that were available during my care of the patient were reviewed by me and considered in my medical decision making (see chart for details).    MDM Rules/Calculators/A&P                      Patient is 86 year old female with presumed mechanical fall that occurred 2 days ago. She does not seem to have struck her head and there is no evidence on my exam of  head trauma. She is unable to give much history as she has dementia, memory issues and is very hard of hearing. The main issue seems to be that she is having some pain of her left leg.  Patient has reassuring physical exam she does have some generalized tenderness of the left leg and she does have some bruising in that area however she has no focal bony tenderness.  I obtained x-ray of the knee and hip and discussed with x-ray tech who states that the entire femur will be seen on x-rays.  There were no acute abnormalities when I independently reviewed the x-rays and agree with radiology read.  I suspect contusion of the lower extremity.  CBC with no significant leukocytosis no significant anemia.  CMP with patient's baseline creatinine which is unchanged today.  Only elevated total bilirubin this may be secondary to decreased oral intake today as well as potentially from hemolysis from her fall and bruises.  She has no abdominal pain doubt biliary cause.  Given the ambiguity of the fall I discussed with patient and daughter at bedside that without CT imaging of head and neck I cannot exclude intracranial hemorrhage.  As this fall occurred 2 days ago is very low suspicion for this however she may have small subarachnoid other subdural or epidural.  Patient cannot lie flat and scanner and  is very uncomfortable with this and is refusing imaging.  I discussed with daughter and she states that she would prefer to avoid sedating the patient and will take her home.  They will have physical therapy done which was set up by transitional care team and myself.  She will have PT/OT.  There agreeable to plan and understanding of our inability to rule out intracranial hemorrhage without CT scanning.  They are given strict return precautions.  Patient has no infectious symptoms is afebrile nontachycardic and well-appearing.  Will discharge at this time.   Final Clinical Impression(s) / ED Diagnoses Final diagnoses:   Fall in home, initial encounter  Contusion of left hip, initial encounter   I discussed this case with my attending physician who cosigned this note including patient's presenting symptoms, physical exam, and planned diagnostics and interventions. Attending physician stated agreement with plan or made changes to plan which were implemented.   Attending physician assessed patient at bedside.  Rx / DC Orders ED Discharge Orders         [place]     06/14/19 2034    Face-to-face encounter (required for Medicare/Medicaid patients)    Comments: I Tedd Sias certify that this patient is under my care and that I, or a nurse practitioner or physician's assistant working with me, had a face-to-face encounter that meets the physician face-to-face encounter requirements with this patient on 06/14/2019. The encounter with the patient was in whole, or in part for the following medical condition(s) which is the primary reason for home health care (List medical condition): Patient had recent follow-up. She does seem to be very frail and would likely benefit greatly from physical therapy and Occupational Therapy.   06/14/19 2034           Tedd Sias, PA 06/15/19 [id number]    Malvin Johns, MD 06/15/19 [phone]

## 2019-06-14 NOTE — Telephone Encounter (Signed)
Pt daughter called to let us know that the patient recently fell and injured herself.  She fell two days ago in the bathroom, cutting her arm open on the sink and injuring her leg leg.  Cut on her arm is about 1-2" long, kind of deep, looks like the skin tore and there "pieces of meat sticking out". She does not want to move her arm very much d/t the pain.  Pt also injured her left leg, unable to bear weight, not able to move her leg at all.  For the patient to just get up and go to the bathroom it takes several people to lift her as she cannot bear weight at all on her leg.   I spoke with Rena to discuss recommendation - she agreed that the patient does need to go to the ED to be checked out.  Might need antibiotics since she has been injured x 2 days and might need scans of her leg to confirm nothing is broken.  I spoke with the daughter and relayed this information - Daughter is going to call EMS to come take her to the ED, she states that they are not going to be able get her in the care themselves -- I advised that EMS is probably the best idea so that they dont risk further injuring her leg.  She is going to call 911 now, she will update once her Mother is settled.   Will send to Dr Darnell Level as Juluis Rainier

## 2019-06-14 NOTE — Discharge Instructions (Addendum)
As we discussed, it is impossible to say whether there is a brain bleed without CT imaging of [name]'s head. I recommend keeping a close eye on her for the time being in case she begins having worsening symptoms, confusion, lethargy or numbness, weakness or other new or concerning symptoms.  The x-rays of her hip and knee were without fracture today. I suspect she has a large contusion/bruise she'll take some time to heal. I have ordered physical therapy and home health.  Please keep the skin tear on her left tricep clean and bandaged as you have been.

## 2019-06-14 NOTE — ED Notes (Signed)
Pt has skin tear to back of left arm, to the back of lower left leg, and the back of the right lower leg. Skin tears bandaged with xeroform, nonadherent pad, and kerlix per daughters request at bedside. Multiple bruises also noted on all extremities.

## 2019-06-15 ENCOUNTER — Telehealth: Payer: Self-pay | Admitting: *Deleted

## 2019-06-15 NOTE — Telephone Encounter (Signed)
Drew from [facility] called to decline referral for home health services.

## 2019-06-15 NOTE — Telephone Encounter (Signed)
Noted. Agree with ER eval. Seen at ER, xrays negative for fracture.  plz call for update on symptoms. Do they think dizziness contributed to fall?

## 2019-06-15 NOTE — Telephone Encounter (Signed)
Lvm asking pt's daughter, Bonnita Nasuti (on dpr), to call back.  Dr. Darnell Level is asking for an update on pt's sxs and if ER thinks dizziness contributed to her fall.

## 2019-06-18 NOTE — Telephone Encounter (Signed)
Lvm asking pt's daughter, Bonnita Nasuti (on dpr), to call back.  Dr. Darnell Level is asking for an update on pt's sxs and if ER thinks dizziness contributed to her fall.

## 2019-06-19 ENCOUNTER — Encounter: Payer: Self-pay | Admitting: Family Medicine

## 2019-06-19 ENCOUNTER — Ambulatory Visit (INDEPENDENT_AMBULATORY_CARE_PROVIDER_SITE_OTHER): Payer: Medicare Other | Admitting: Family Medicine

## 2019-06-19 ENCOUNTER — Other Ambulatory Visit: Payer: Self-pay

## 2019-06-19 VITALS — BP 132/76 | HR 94 | Temp 97.8°F | Ht 63.5 in | Wt 113.2 lb

## 2019-06-19 DIAGNOSIS — I5032 Chronic diastolic (congestive) heart failure: Secondary | ICD-10-CM | POA: Diagnosis not present

## 2019-06-19 DIAGNOSIS — E440 Moderate protein-calorie malnutrition: Secondary | ICD-10-CM

## 2019-06-19 DIAGNOSIS — E46 Unspecified protein-calorie malnutrition: Secondary | ICD-10-CM | POA: Insufficient documentation

## 2019-06-19 DIAGNOSIS — S41112A Laceration without foreign body of left upper arm, initial encounter: Secondary | ICD-10-CM | POA: Diagnosis not present

## 2019-06-19 DIAGNOSIS — R296 Repeated falls: Secondary | ICD-10-CM

## 2019-06-19 DIAGNOSIS — N1832 Chronic kidney disease, stage 3b: Secondary | ICD-10-CM | POA: Diagnosis not present

## 2019-06-19 DIAGNOSIS — R531 Weakness: Secondary | ICD-10-CM

## 2019-06-19 DIAGNOSIS — F0390 Unspecified dementia without behavioral disturbance: Secondary | ICD-10-CM

## 2019-06-19 DIAGNOSIS — S81809A Unspecified open wound, unspecified lower leg, initial encounter: Secondary | ICD-10-CM

## 2019-06-19 NOTE — Patient Instructions (Addendum)
Weight loss noted, recent protein levels were low - start ensure or boost 1/2-1 can once daily before lunch or dinner.  I'd like to have you get new scale to continue monitoring weights once daily.  Consider changing lasix to 1/2 tablet as needed for leg swelling or shortness of breath.  Continue dressing changes daily - as up to now. I will ask wound care to come along with home health.  I will ask palliative care to come out for home evaluation.

## 2019-06-19 NOTE — Assessment & Plan Note (Addendum)
Progressive weakness noted along with increasing unsteadiness on her feet - will benefit from [facility] as well as palliative care team evaluation afterwards. I would like HHPT to eval for optimal ambulatory assistive device (current walker is too large to bring into bathroom with her).

## 2019-06-19 NOTE — Assessment & Plan Note (Addendum)
New wound to L posterior upper arm. Wound cleaned and dressed. No signs of infection. Will ask wound care to come out with home health already ordered by ER provider.

## 2019-06-19 NOTE — Assessment & Plan Note (Signed)
Deteriorated - suggested lasix PRN instead of scheduled, encouraged ensuring good water intake. Will reassess at f/u visit.

## 2019-06-19 NOTE — Assessment & Plan Note (Signed)
Bilateral leg wounds evaluated - rec continue dressing changes as up to now.

## 2019-06-19 NOTE — Assessment & Plan Note (Signed)
With increased frailty/debility. I agree with [facility] PT, OT, will ask SN to come out for wound management and nurse aide as well.

## 2019-06-19 NOTE — Progress Notes (Signed)
This visit was conducted in person.  BP 132/76 (BP Location: Left Arm, Patient Position: Sitting, Cuff Size: Normal)   Pulse 94   Temp 97.8 F (36.6 C) (Temporal)   Ht 5' 3.5" (1.613 m)   Wt 113 lb 3 oz (51.3 kg)   SpO2 97%   BMI 19.74 kg/m    CC: hosp f/u visit  Subjective:    Patient ID: [name], female    DOB: 10-13-1920, 86 y.o.   MRN: [id number]  HPI: [name] is a 86 y.o. female presenting on 06/19/2019 for Hospitalization Follow-up (Pt accompained by granddaughter, Brandy- temp 98.2.)   Recent ER visit after fall while going to the bathroom. Records reviewed.  DOI: 06/12/2019, seen at ER 2 days later.  Diagnosed with L hip contusion and L elbow laceration.  Some increased confusion noted recently.   Uses walker regularly at home. Despite this she has had several falls recently. Current walker is too bulky to easily take into the bathroom.   10 lb weight loss since December - they note decreased appetite recently. Since hospitalization, appetite has picked up.   Saw derm - had spot on cheek removed. Needs to f/u for this.  [place] daughter requests handicap placard for patient.  Grand daughter notes concerns over ongoing caregiver strain on her mother (pt's daughter) - requests info for further assistance      Relevant past medical, surgical, family and social history reviewed and updated as indicated. Interim medical history since our last visit reviewed. Allergies and medications reviewed and updated. Outpatient Medications Prior to Visit  Medication Sig Dispense Refill  . acetaminophen (TYLENOL) 325 MG tablet Take 2 tablets (650 mg total) by mouth every 6 (six) hours as needed for mild pain or moderate pain (or Fever >/= 101). 80 tablet 0  . amLODipine (NORVASC) 5 MG tablet Take 1 tablet (5 mg total) by mouth daily. For blood pressure    . Cholecalciferol (VITAMIN D3) 1000 UNITS CAPS Take by mouth 2 (two) times daily.      . Cyanocobalamin (VITAMIN  B-12) 500 MCG TABS Take 500 mcg by mouth daily.    . ferrous sulfate 325 (65 FE) MG tablet Take 1 tablet (325 mg total) by mouth every Monday, Wednesday, and Friday.    . furosemide (LASIX) 20 MG tablet Take 0.5 tablets (10 mg total) by mouth daily as needed for fluid (leg swelling).    Marland Kitchen levothyroxine (SYNTHROID) 25 MCG tablet TAKE 1 TABLET BY MOUTH DAILY BEFORE BREAKFAST for thyroid.    . Multiple Vitamins-Minerals (EYE VITAMINS & MINERALS PO) Take 1 tablet by mouth daily. [facility]    . omeprazole (PRILOSEC OTC) 20 MG tablet Take 1 tablet (20 mg total) by mouth daily as needed (reflux/heartburn).    Marland Kitchen amLODipine (NORVASC) 5 MG tablet TAKE 1 TABLET BY MOUTH EVERY DAY IN THE EVENING 90 tablet 3  . furosemide (LASIX) 20 MG tablet TAKE 1/2 TABLET BY MOUTH EVERY MORNING. 45 tablet 3  . levothyroxine (SYNTHROID) 25 MCG tablet TAKE 1 TABLET BY MOUTH DAILY BEFORE BREAKFAST. 90 tablet 3  . omeprazole (PRILOSEC OTC) 20 MG tablet Take 1 tablet (20 mg total) by mouth daily as needed.     No facility-administered medications prior to visit.     Per HPI unless specifically indicated in ROS section below Review of Systems Objective:  BP 132/76 (BP Location: Left Arm, Patient Position: Sitting, Cuff Size: Normal)   Pulse 94   Temp 97.8  F (36.6 C) (Temporal)   Ht 5' 3.5" (1.613 m)   Wt 113 lb 3 oz (51.3 kg)   SpO2 97%   BMI 19.74 kg/m   Wt Readings from Last 3 Encounters:  06/19/19 113 lb 3 oz (51.3 kg)  12/26/18 123 lb (55.8 kg)  12/12/18 124 lb 1 oz (56.3 kg)      Physical Exam Vitals and nursing note reviewed.  Constitutional:      Appearance: Normal appearance. She is not ill-appearing.     Comments: Ambulates with walker  Cardiovascular:     Rate and Rhythm: Normal rate and regular rhythm.     Pulses: Normal pulses.     Heart sounds: Normal heart sounds. No murmur.  Pulmonary:     Effort: Pulmonary effort is normal. No respiratory distress.     Breath sounds: Normal breath  sounds. No wheezing, rhonchi or rales.  Musculoskeletal:     Right lower leg: Edema (1+) present.     Left lower leg: Edema (1+) present.  Skin:    General: Skin is warm and dry.     Findings: Bruising, laceration and wound present.     Comments:  Thin skin Skin tears to L calf as well as R shin 3 cm skin tear to L distal posterior upper arm - arm tear cleaned with normal saline and dressed with non stick gauze and kerlix.   Neurological:     Mental Status: She is alert.     Gait: Gait abnormal.     Comments:  Needs assistance to get up out of chair  Uses walker for mobility       Results for orders placed or performed during the hospital encounter of 06/14/19  Comprehensive metabolic panel  Result Value Ref Range   Sodium 140 135 - 145 mmol/L   Potassium 3.7 3.5 - 5.1 mmol/L   Chloride 106 98 - 111 mmol/L   CO2 25 22 - 32 mmol/L   Glucose, Bld 101 (H) 70 - 99 mg/dL   BUN 35 (H) 8 - 23 mg/dL   Creatinine, Ser 1.63 (H) 0.44 - 1.00 mg/dL   Calcium 9.3 8.9 - 10.3 mg/dL   Total Protein 5.7 (L) 6.5 - 8.1 g/dL   Albumin 3.0 (L) 3.5 - 5.0 g/dL   AST 15 15 - 41 U/L   ALT 15 0 - 44 U/L   Alkaline Phosphatase 44 38 - 126 U/L   Total Bilirubin 1.3 (H) 0.3 - 1.2 mg/dL   GFR calc non Af Amer 26 (L) >60 mL/min   GFR calc Af Amer 31 (L) >60 mL/min   Anion gap 9 5 - 15  CBC  Result Value Ref Range   WBC 10.8 (H) 4.0 - 10.5 K/uL   RBC 3.73 (L) 3.87 - 5.11 MIL/uL   Hemoglobin 11.7 (L) 12.0 - 15.0 g/dL   HCT 36.9 36.0 - 46.0 %   MCV 98.9 80.0 - 100.0 fL   MCH 31.4 26.0 - 34.0 pg   MCHC 31.7 30.0 - 36.0 g/dL   RDW 12.6 11.5 - 15.5 %   Platelets PLATELET CLUMPS NOTED ON SMEAR, UNABLE TO ESTIMATE 150 - 400 K/uL   nRBC 0.0 0.0 - 0.2 %   Lab Results  Component Value Date   TSH 2.95 12/26/2018    Assessment & Plan:  This visit occurred during the SARS-CoV-2 public health emergency.  Safety protocols were in place, including screening questions prior to the visit, additional usage of  staff PPE, and extensive cleaning of exam room while observing appropriate contact time as indicated for disinfecting solutions.   Problem List Items Addressed This Visit    Skin tear of left upper arm without complication    New wound to L posterior upper arm. Wound cleaned and dressed. No signs of infection. Will ask wound care to come out with home health already ordered by ER provider.       Relevant Orders   Ambulatory referral to Home Health   Senile dementia [facility])   Relevant Orders   Amb Referral to Palliative Care   Recurrent falls - Primary    With increased frailty/debility. I agree with [facility] PT, OT, will ask SN to come out for wound management and nurse aide as well.       Relevant Orders   Ambulatory referral to [place] malnutrition [facility])    As evidenced by low albumin and protein and 10 lb (8%) weight loss over the last 5 months. [place] daughter endorses worsening appetite. I recommended boost or ensure supplement as orexigenic agent prior to lunch or dinner.       Relevant Orders   Amb Referral to Palliative Care   Open leg wound    Bilateral leg wounds evaluated - rec continue dressing changes as up to now.       Relevant Orders   Ambulatory referral to [place] weakness    Progressive weakness noted along with increasing unsteadiness on her feet - will benefit from [facility] as well as palliative care team evaluation afterwards. I would like HHPT to eval for optimal ambulatory assistive device (current walker is too large to bring into bathroom with her).      Relevant Orders   Ambulatory referral to [place]   Amb Referral to Palliative Care   CKD (chronic kidney disease), stage III    Deteriorated - suggested lasix PRN instead of scheduled, encouraged ensuring good water intake. Will reassess at f/u visit.       Chronic diastolic CHF (congestive heart failure) (HCC)    Encouraged they restart daily weights to help monitor fluid  status. Discussed changing lasix to PRN given weight loss noted.       Relevant Medications   amLODipine (NORVASC) 5 MG tablet   furosemide (LASIX) 20 MG tablet   Other Relevant Orders   Amb Referral to Palliative Care       No orders of the defined types were placed in this encounter.  Orders Placed This Encounter  Procedures  . Ambulatory referral to Home Health    Referral Priority:   Routine    Referral Type:   Home Health Care    Referral Reason:   Specialty Services Required    Requested Specialty:   [place]    Number of Visits Requested:   1  . Amb Referral to Palliative Care    Referral Priority:   Routine    Referral Type:   Consultation    Referral Reason:   Symptom Managment    Number of Visits Requested:   1    Patient Instructions  Weight loss noted, recent protein levels were low - start ensure or boost 1/2-1 can once daily before lunch or dinner.  I'd like to have you get new scale to continue monitoring weights once daily.  Consider changing lasix to 1/2 tablet as needed for leg swelling or shortness of breath.  Continue dressing changes daily -  as up to now. I will ask wound care to come along with home health.  I will ask palliative care to come out for home evaluation.    Follow up plan: No follow-ups on file.  Ria Bush, MD

## 2019-06-19 NOTE — Telephone Encounter (Signed)
Pt has hosp f/u today at 3:45.

## 2019-06-19 NOTE — Assessment & Plan Note (Addendum)
Encouraged they restart daily weights to help monitor fluid status. Discussed changing lasix to PRN given weight loss noted.

## 2019-06-19 NOTE — Assessment & Plan Note (Signed)
As evidenced by low albumin and protein and 10 lb (8%) weight loss over the last 5 months. [place] daughter endorses worsening appetite. I recommended boost or ensure supplement as orexigenic agent prior to lunch or dinner.

## 2019-06-20 ENCOUNTER — Telehealth: Payer: Self-pay | Admitting: Family Medicine

## 2019-06-20 ENCOUNTER — Telehealth: Payer: Self-pay | Admitting: Hospice

## 2019-06-20 NOTE — Telephone Encounter (Signed)
Patient's son-in-law,Graham,called.  Dr.G wanted to know the dates of patient's covid vaccine-03/21/19 and 04/18/19. She had Moderna shot. Patient had it done at [facility].

## 2019-06-20 NOTE — Telephone Encounter (Signed)
Noted. Updated pt's chart.  

## 2019-06-20 NOTE — Telephone Encounter (Signed)
Phone call placed to patient to offer to schedule a visit with [facility]. Phone rang, with no answer I left a voicemail for call back. 

## 2019-06-22 ENCOUNTER — Telehealth: Payer: Self-pay | Admitting: Hospice

## 2019-06-22 ENCOUNTER — Telehealth: Payer: Self-pay | Admitting: Family Medicine

## 2019-06-22 DIAGNOSIS — R296 Repeated falls: Secondary | ICD-10-CM

## 2019-06-22 DIAGNOSIS — R531 Weakness: Secondary | ICD-10-CM

## 2019-06-22 DIAGNOSIS — S81809A Unspecified open wound, unspecified lower leg, initial encounter: Secondary | ICD-10-CM

## 2019-06-22 DIAGNOSIS — S41112A Laceration without foreign body of left upper arm, initial encounter: Secondary | ICD-10-CM

## 2019-06-22 NOTE — Addendum Note (Signed)
Addended by: Ria Bush on: 06/22/2019 04:46 PM   Modules accepted: Orders

## 2019-06-22 NOTE — Telephone Encounter (Signed)
HH referral placed again.

## 2019-06-22 NOTE — Telephone Encounter (Signed)
Wound and new [place] referrals placed.

## 2019-06-22 NOTE — Telephone Encounter (Signed)
Scheduled [facility] visit for 07-12-19 at 11:00. 

## 2019-06-22 NOTE — Telephone Encounter (Signed)
Due to low staffing we could not find a [place] agency to accept our patient who needs Wound Care and PT. Please place a Wound Clinic Referral and separate [facility] PT only Referral. We have her scheduled for the Wound Clinic on Tuesday  6-1/21 at 2:15pm and her family has already been called. We can try to get [facility] for just PT/OT only without asking for the Nursing or Aide.

## 2019-06-26 ENCOUNTER — Other Ambulatory Visit: Payer: Self-pay

## 2019-06-26 ENCOUNTER — Encounter: Payer: Medicare Other | Attending: Physician Assistant | Admitting: Physician Assistant

## 2019-06-26 DIAGNOSIS — S81811A Laceration without foreign body, right lower leg, initial encounter: Secondary | ICD-10-CM | POA: Insufficient documentation

## 2019-06-26 DIAGNOSIS — I5032 Chronic diastolic (congestive) heart failure: Secondary | ICD-10-CM | POA: Insufficient documentation

## 2019-06-26 DIAGNOSIS — X58XXXA Exposure to other specified factors, initial encounter: Secondary | ICD-10-CM | POA: Diagnosis not present

## 2019-06-26 DIAGNOSIS — F0390 Unspecified dementia without behavioral disturbance: Secondary | ICD-10-CM | POA: Insufficient documentation

## 2019-06-26 DIAGNOSIS — I130 Hypertensive heart and chronic kidney disease with heart failure and stage 1 through stage 4 chronic kidney disease, or unspecified chronic kidney disease: Secondary | ICD-10-CM | POA: Diagnosis not present

## 2019-06-26 DIAGNOSIS — N183 Chronic kidney disease, stage 3 unspecified: Secondary | ICD-10-CM | POA: Insufficient documentation

## 2019-06-26 DIAGNOSIS — S51812A Laceration without foreign body of left forearm, initial encounter: Secondary | ICD-10-CM | POA: Insufficient documentation

## 2019-06-26 DIAGNOSIS — S51012A Laceration without foreign body of left elbow, initial encounter: Secondary | ICD-10-CM | POA: Insufficient documentation

## 2019-06-26 DIAGNOSIS — S81812A Laceration without foreign body, left lower leg, initial encounter: Secondary | ICD-10-CM | POA: Diagnosis not present

## 2019-06-26 DIAGNOSIS — S41112A Laceration without foreign body of left upper arm, initial encounter: Secondary | ICD-10-CM | POA: Diagnosis not present

## 2019-06-26 DIAGNOSIS — S41102A Unspecified open wound of left upper arm, initial encounter: Secondary | ICD-10-CM | POA: Diagnosis not present

## 2019-06-26 DIAGNOSIS — S80821A Blister (nonthermal), right lower leg, initial encounter: Secondary | ICD-10-CM | POA: Diagnosis not present

## 2019-06-26 DIAGNOSIS — S81802A Unspecified open wound, left lower leg, initial encounter: Secondary | ICD-10-CM | POA: Diagnosis not present

## 2019-06-26 DIAGNOSIS — S81801A Unspecified open wound, right lower leg, initial encounter: Secondary | ICD-10-CM | POA: Diagnosis not present

## 2019-06-27 ENCOUNTER — Telehealth: Payer: Self-pay | Admitting: *Deleted

## 2019-06-27 ENCOUNTER — Telehealth: Payer: Self-pay | Admitting: Family Medicine

## 2019-06-27 DIAGNOSIS — Z9181 History of falling: Secondary | ICD-10-CM | POA: Diagnosis not present

## 2019-06-27 DIAGNOSIS — I5032 Chronic diastolic (congestive) heart failure: Secondary | ICD-10-CM | POA: Diagnosis not present

## 2019-06-27 DIAGNOSIS — E039 Hypothyroidism, unspecified: Secondary | ICD-10-CM | POA: Diagnosis not present

## 2019-06-27 DIAGNOSIS — S51802A Unspecified open wound of left forearm, initial encounter: Secondary | ICD-10-CM | POA: Diagnosis not present

## 2019-06-27 DIAGNOSIS — Z85038 Personal history of other malignant neoplasm of large intestine: Secondary | ICD-10-CM | POA: Diagnosis not present

## 2019-06-27 DIAGNOSIS — S81811D Laceration without foreign body, right lower leg, subsequent encounter: Secondary | ICD-10-CM | POA: Diagnosis not present

## 2019-06-27 DIAGNOSIS — I130 Hypertensive heart and chronic kidney disease with heart failure and stage 1 through stage 4 chronic kidney disease, or unspecified chronic kidney disease: Secondary | ICD-10-CM | POA: Diagnosis not present

## 2019-06-27 DIAGNOSIS — S41112D Laceration without foreign body of left upper arm, subsequent encounter: Secondary | ICD-10-CM | POA: Diagnosis not present

## 2019-06-27 DIAGNOSIS — S81812D Laceration without foreign body, left lower leg, subsequent encounter: Secondary | ICD-10-CM | POA: Diagnosis not present

## 2019-06-27 DIAGNOSIS — Z86010 Personal history of colonic polyps: Secondary | ICD-10-CM | POA: Diagnosis not present

## 2019-06-27 DIAGNOSIS — N1832 Chronic kidney disease, stage 3b: Secondary | ICD-10-CM | POA: Diagnosis not present

## 2019-06-27 DIAGNOSIS — E559 Vitamin D deficiency, unspecified: Secondary | ICD-10-CM | POA: Diagnosis not present

## 2019-06-27 DIAGNOSIS — S81802A Unspecified open wound, left lower leg, initial encounter: Secondary | ICD-10-CM | POA: Diagnosis not present

## 2019-06-27 DIAGNOSIS — S41102A Unspecified open wound of left upper arm, initial encounter: Secondary | ICD-10-CM | POA: Diagnosis not present

## 2019-06-27 DIAGNOSIS — E440 Moderate protein-calorie malnutrition: Secondary | ICD-10-CM | POA: Diagnosis not present

## 2019-06-27 NOTE — Telephone Encounter (Signed)
Agree with both of these verbal orders. Thanks

## 2019-06-27 NOTE — Telephone Encounter (Signed)
[name], with [facility] called today She stated that there was a delay in the patient's physical therapy start of care/. They will be going out to see the patient today and wanted you to be aware

## 2019-06-27 NOTE — Telephone Encounter (Signed)
Noted  

## 2019-06-27 NOTE — Telephone Encounter (Addendum)
Lvm asking [name], PT with [facility], to call back.  Need to inform her Dr. Darnell Level is giving verbal orders for PT and nursing consult for wound care.

## 2019-06-27 NOTE — Telephone Encounter (Signed)
[name] with [facility] left a voicemail stating that she is trying to get verbal orders for physical therapy fore once a week for one week, twice a week for three weeks.  They are also  trying to get verbal orders for nursing consult for wound care.

## 2019-06-28 DIAGNOSIS — Z9181 History of falling: Secondary | ICD-10-CM | POA: Diagnosis not present

## 2019-06-28 DIAGNOSIS — I5032 Chronic diastolic (congestive) heart failure: Secondary | ICD-10-CM | POA: Diagnosis not present

## 2019-06-28 DIAGNOSIS — Z86010 Personal history of colonic polyps: Secondary | ICD-10-CM | POA: Diagnosis not present

## 2019-06-28 DIAGNOSIS — E440 Moderate protein-calorie malnutrition: Secondary | ICD-10-CM | POA: Diagnosis not present

## 2019-06-28 DIAGNOSIS — N1832 Chronic kidney disease, stage 3b: Secondary | ICD-10-CM | POA: Diagnosis not present

## 2019-06-28 DIAGNOSIS — E039 Hypothyroidism, unspecified: Secondary | ICD-10-CM | POA: Diagnosis not present

## 2019-06-28 DIAGNOSIS — Z85038 Personal history of other malignant neoplasm of large intestine: Secondary | ICD-10-CM | POA: Diagnosis not present

## 2019-06-28 DIAGNOSIS — S81812D Laceration without foreign body, left lower leg, subsequent encounter: Secondary | ICD-10-CM | POA: Diagnosis not present

## 2019-06-28 DIAGNOSIS — E559 Vitamin D deficiency, unspecified: Secondary | ICD-10-CM | POA: Diagnosis not present

## 2019-06-28 DIAGNOSIS — S41112D Laceration without foreign body of left upper arm, subsequent encounter: Secondary | ICD-10-CM | POA: Diagnosis not present

## 2019-06-28 DIAGNOSIS — I130 Hypertensive heart and chronic kidney disease with heart failure and stage 1 through stage 4 chronic kidney disease, or unspecified chronic kidney disease: Secondary | ICD-10-CM | POA: Diagnosis not present

## 2019-06-28 DIAGNOSIS — S81811D Laceration without foreign body, right lower leg, subsequent encounter: Secondary | ICD-10-CM | POA: Diagnosis not present

## 2019-06-28 NOTE — Telephone Encounter (Signed)
Spoke with [name] informing her Dr. Darnell Level is giving verbal orders for both PT and nursing consult for wound care.

## 2019-06-28 NOTE — Progress Notes (Signed)
Traci, Cook ([id number]) Visit Report for 06/26/2019 Allergy List Details Patient Name: [name], [name]. Date of Service: 06/26/2019 2:15 PM Medical Record Number: [id number] Patient Account Number: [id number] Date of Birth/Sex: 30-Nov-1920 (86 y.o. F) Treating RN: Army Melia Primary Care Gregori Abril: Ria Bush Other Clinician: Referring Drury Ardizzone: Ria Bush Treating Hallel Denherder/Extender: STONE III, HOYT Weeks in Treatment: 0 Allergies Active Allergies No Known Allergies Allergy Notes Electronic Signature(s) Signed: 06/28/2019 11:31:22 AM By: Army Melia Entered By: Army Melia on 06/26/2019 14:39:53 Staubs, [name] ([id number]) -------------------------------------------------------------------------------- Arrival Information Details Patient Name: [name] Date of Service: 06/26/2019 2:15 PM Medical Record Number: [id number] Patient Account Number: [id number] Date of Birth/Sex: [birth year] (86 y.o. F) Treating RN: Montey Hora Primary Care Kathleen Tamm: Ria Bush Other Clinician: Referring Darris Staiger: Ria Bush Treating Lesieli Bresee/Extender: Melburn Hake, HOYT Weeks in Treatment: 0 Visit Information Patient Arrived: Wheel Chair Arrival Time: 14:22 Accompanied By: granddaughter Transfer Assistance: Manual Patient Identification Verified: Yes Secondary Verification Process Completed: Yes History Since Last Visit Added or deleted any medications: No Any new allergies or adverse reactions: No Had a fall or experienced change in activities of daily living that may affect risk of falls: No Signs or symptoms of abuse/neglect since last visito No Hospitalized since last visit: No Implantable device outside of the clinic excluding cellular tissue based products placed in the center since last visit: No Pain Present Now: No Electronic Signature(s) Signed: 06/26/2019 4:34:14 PM By: Lorine Bears RCP, RRT, CHT Entered By: Lorine Bears on 06/26/2019 14:24:28 [name] ([id number]) -------------------------------------------------------------------------------- Clinic Level of Care Assessment Details Patient Name: [name] Date of Service: 06/26/2019 2:15 PM Medical Record Number: [id number] Patient Account Number: [id number] Date of Birth/Sex: [birth year] (86 y.o. F) Treating RN: Montey Hora Primary Care Deaven Barron: Ria Bush Other Clinician: Referring Phoenix Dresser: Ria Bush Treating Kataleyah Carducci/Extender: Melburn Hake, HOYT Weeks in Treatment: 0 Clinic Level of Care Assessment Items TOOL 1 Quantity Score []  - Use when EandM and Procedure is performed on INITIAL visit 0 ASSESSMENTS - Nursing Assessment / Reassessment X - General Physical Exam (combine w/ comprehensive assessment (listed just below) when performed on new 1 20 pt. evals) X- 1 25 Comprehensive Assessment (HX, ROS, Risk Assessments, Wounds Hx, etc.) ASSESSMENTS - Wound and Skin Assessment / Reassessment []  - Dermatologic / Skin Assessment (not related to wound area) 0 ASSESSMENTS - Ostomy and/or Continence Assessment and Care []  - Incontinence Assessment and Management 0 []  - 0 Ostomy Care Assessment and Management (repouching, etc.) PROCESS - Coordination of Care X - Simple Patient / Family Education for ongoing care 1 15 []  - 0 Complex (extensive) Patient / Family Education for ongoing care X- 1 10 Staff obtains Programmer, systems, Records, Test Results / Process Orders []  - 0 Staff telephones HHA, Nursing Homes / Clarify orders / etc []  - 0 Routine Transfer to another Facility (non-emergent condition) []  - 0 Routine Hospital Admission (non-emergent condition) X- 1 15 New Admissions / Biomedical engineer / Ordering NPWT, Apligraf, etc. []  - 0 Emergency Hospital Admission (emergent condition) PROCESS - Special Needs []  - Pediatric / Minor Patient Management 0 []  - 0 Isolation Patient Management []  - 0 Hearing /  Language / Visual special needs []  - 0 Assessment of Community assistance (transportation, D/C planning, etc.) []  - 0 Additional assistance / Altered mentation []  - 0 Support Surface(s) Assessment (bed, cushion, seat, etc.) INTERVENTIONS - Miscellaneous []  - External ear exam 0 []  - 0 Patient Transfer (multiple staff / Civil Service fast streamer / Similar  devices) []  - 0 Simple Staple / Suture removal (25 or less) []  - 0 Complex Staple / Suture removal (26 or more) []  - 0 Hypo/Hyperglycemic Management (do not check if billed separately) []  - 0 Ankle / Brachial Index (ABI) - do not check if billed separately Has the patient been seen at the hospital within the last three years: Yes Total Score: 85 Level Of Care: New/Established - Level 3 Cullens, Halaina W. ([id number]) Electronic Signature(s) Signed: 06/26/2019 4:28:28 PM By: Montey Hora Entered By: Montey Hora on 06/26/2019 15:41:26 Mcpherson, [name] ([id number]) -------------------------------------------------------------------------------- Lower Extremity Assessment Details Patient Name: [name] Date of Service: 06/26/2019 2:15 PM Medical Record Number: [id number] Patient Account Number: [id number] Date of Birth/Sex: 1921/01/01 (86 y.o. F) Treating RN: Army Melia Primary Care Kambri Dismore: Ria Bush Other Clinician: Referring Virgilia Quigg: Ria Bush Treating Cashae Weich/Extender: STONE III, HOYT Weeks in Treatment: 0 Edema Assessment Assessed: [Left: No] [Right: No] Edema: [Left: Yes] [Right: No] Calf Left: Right: Point of Measurement: 32 cm From Medial Instep 33 cm 32 cm Ankle Left: Right: Point of Measurement: 10 cm From Medial Instep 21 cm 20 cm Vascular Assessment Pulses: Dorsalis Pedis Palpable: [Left:Yes] [Right:Yes] Electronic Signature(s) Signed: 06/28/2019 11:31:22 AM By: Army Melia Entered By: Army Melia on 06/26/2019 14:39:49 Ceci, [name]  ([id number]) -------------------------------------------------------------------------------- Multi Wound Chart Details Patient Name: [name] Date of Service: 06/26/2019 2:15 PM Medical Record Number: [id number] Patient Account Number: [id number] Date of Birth/Sex: 1920/03/09 (86 y.o. F) Treating RN: Montey Hora Primary Care Jasreet Dickie: Ria Bush Other Clinician: Referring Santanna Olenik: Ria Bush Treating Emonte Dieujuste/Extender: STONE III, HOYT Weeks in Treatment: 0 Vital Signs Height(in): 62 Pulse(bpm): 86 Weight(lbs): 113 Blood Pressure(mmHg): 154/54 Body Mass Index(BMI): 21 Temperature(F): 98.3 Respiratory Rate(breaths/min): 16 Photos: Wound Location: Left Upper Arm Left Forearm Left, Posterior Lower Leg Wounding Event: Trauma Trauma Trauma Primary Etiology: Skin Tear Trauma, Other Trauma, Other Date Acquired: 06/12/2019 06/12/2019 06/12/2019 Weeks of Treatment: 0 0 0 Wound Status: Open Open Open Measurements L x W x D (cm) 0.7x3.5x0.1 1.8x5x0.1 1.9x1x0.1 Area (cm) : 1.924 7.069 1.492 Volume (cm) : 0.192 0.707 0.149 Classification: Full Thickness Without Exposed Full Thickness Without Exposed Full Thickness Without Exposed Support Structures Support Structures Support Structures Exudate Amount: Medium Medium Medium Exudate Type: Serosanguineous Serosanguineous Serosanguineous Exudate Color: red, brown red, brown red, brown Wound Margin: Flat and Intact N/A Flat and Intact Granulation Amount: Large (67-100%) Medium (34-66%) Medium (34-66%) Granulation Quality: Red Red Red Necrotic Amount: Small (1-33%) Medium (34-66%) Medium (34-66%) Exposed Structures: Fat Layer (Subcutaneous Tissue) Fat Layer (Subcutaneous Tissue) Fat Layer (Subcutaneous Tissue) Exposed: Yes Exposed: Yes Exposed: Yes Fascia: No Fascia: No Fascia: No Tendon: No Tendon: No Tendon: No Muscle: No Muscle: No Muscle: No Joint: No Joint: No Joint: No Bone: No Bone: No Bone:  No Epithelialization: None None None Wound Number: 7 N/A N/A Photos: No Photos N/A N/A Wound Location: Right, Lateral Lower Leg N/A N/A Wounding Event: Trauma N/A N/A Primary Etiology: Trauma, Other N/A N/A Date Acquired: 06/12/2019 N/A N/A Weeks of Treatment: 0 N/A N/A Wound Status: Open N/A N/A Measurements L x W x D (cm) 1.5x0.3x0.1 N/A N/A Area (cm) : 0.353 N/A N/A Volume (cm) : 0.035 N/A N/A Classification: Full Thickness Without Exposed N/A N/A Support Structures Exudate Amount: Medium N/A N/A Exudate Type: Serosanguineous N/A N/A Manninen, Deboraha W. ([id number]) Exudate Color: red, brown N/A N/A Wound Margin: Flat and Intact N/A N/A Granulation Amount: Medium (34-66%) N/A N/A Granulation Quality: Red N/A N/A Necrotic Amount: Medium (  34-66%) N/A N/A Exposed Structures: Fat Layer (Subcutaneous Tissue) N/A N/A Exposed: Yes Fascia: No Tendon: No Muscle: No Joint: No Bone: No Epithelialization: None N/A N/A Treatment Notes Electronic Signature(s) Signed: 06/26/2019 4:28:28 PM By: Montey Hora Entered By: Montey Hora on 06/26/2019 14:51:02 Neyra, [name] ([id number]) -------------------------------------------------------------------------------- [place] Details Patient Name: [name] Date of Service: 06/26/2019 2:15 PM Medical Record Number: [id number] Patient Account Number: [id number] Date of Birth/Sex: 1920-06-13 (86 y.o. F) Treating RN: Montey Hora Primary Care Carmyn Hamm: Ria Bush Other Clinician: Referring Shelitha Magley: Ria Bush Treating Latondra Gebhart/Extender: Melburn Hake, HOYT Weeks in Treatment: 0 Active Inactive Abuse / Safety / Falls / Self Care Management Nursing Diagnoses: Potential for falls Goals: Patient will remain injury free related to falls Date Initiated: 06/26/2019 Target Resolution Date: 09/29/2019 Goal Status: Active Interventions: Assess fall risk on admission and as needed Notes: Orientation to  the Wound Care Program Nursing Diagnoses: Knowledge deficit related to the wound healing center program Goals: Patient/caregiver will verbalize understanding of the [place] Program Date Initiated: 06/26/2019 Target Resolution Date: 09/29/2019 Goal Status: Active Interventions: Provide education on orientation to the wound center Notes: Wound/Skin Impairment Nursing Diagnoses: Impaired tissue integrity Goals: Ulcer/skin breakdown will heal within 14 weeks Date Initiated: 06/26/2019 Target Resolution Date: 09/29/2019 Goal Status: Active Interventions: Assess patient/caregiver ability to obtain necessary supplies Assess patient/caregiver ability to perform ulcer/skin care regimen upon admission and as needed Assess ulceration(s) every visit Notes: Electronic Signature(s) Signed: 06/26/2019 4:28:28 PM By: Montey Hora Entered By: Montey Hora on 06/26/2019 14:50:47 Dudas, [name] ([id number]) -------------------------------------------------------------------------------- Pain Assessment Details Patient Name: [name] Date of Service: 06/26/2019 2:15 PM Medical Record Number: [id number] Patient Account Number: [id number] Date of Birth/Sex: [birth year] (86 y.o. F) Treating RN: Montey Hora Primary Care Valma Rotenberg: Ria Bush Other Clinician: Referring Lamar Meter: Ria Bush Treating Mariane Burpee/Extender: Melburn Hake, HOYT Weeks in Treatment: 0 Active Problems Location of Pain Severity and Description of Pain Patient Has Paino No Site Locations Pain Management and Medication Current Pain Management: Electronic Signature(s) Signed: 06/26/2019 4:28:28 PM By: Montey Hora Signed: 06/26/2019 4:34:14 PM By: Lorine Bears RCP, RRT, CHT Entered By: Lorine Bears on 06/26/2019 14:24:37 Hovsepian, [name] ([id number]) -------------------------------------------------------------------------------- Patient/Caregiver Education  Details Patient Name: [name] Date of Service: 06/26/2019 2:15 PM Medical Record Number: [id number] Patient Account Number: [id number] Date of Birth/Gender: 04-23-1920 (86 y.o. F) Treating RN: Montey Hora Primary Care Physician: Ria Bush Other Clinician: Referring Physician: Ria Bush Treating Physician/Extender: Sharalyn Ink in Treatment: 0 Education Assessment Education Provided To: Caregiver Education Topics Provided Wound/Skin Impairment: Handouts: Other: wound care Methods: Demonstration, Explain/Verbal Responses: State content correctly Electronic Signature(s) Signed: 06/26/2019 4:28:28 PM By: Montey Hora Entered By: Montey Hora on 06/26/2019 15:42:32 Bily, [name] ([id number]) -------------------------------------------------------------------------------- Wound Assessment Details Patient Name: [name] Date of Service: 06/26/2019 2:15 PM Medical Record Number: [id number] Patient Account Number: [id number] Date of Birth/Sex: [birth year] (86 y.o. F) Treating RN: Army Melia Primary Care Beonka Amesquita: Ria Bush Other Clinician: Referring Sheba Whaling: Ria Bush Treating Aylani Spurlock/Extender: STONE III, HOYT Weeks in Treatment: 0 Wound Status Wound Number: 4 Primary Etiology: Skin Tear Wound Location: Left Upper Arm Wound Status: Open Wounding Event: Trauma Date Acquired: 06/12/2019 Weeks Of Treatment: 0 Clustered Wound: No Photos Wound Measurements Length: (cm) 0.7 Width: (cm) 3.5 Depth: (cm) 0.1 Area: (cm) 1.924 Volume: (cm) 0.192 % Reduction in Area: % Reduction in Volume: Epithelialization: None Tunneling: No Undermining: No Wound Description Classification: Full Thickness Without Exposed Support Structu  Wound Margin: Flat and Intact Exudate Amount: Medium Exudate Type: Serosanguineous Exudate Color: red, brown res Foul Odor After Cleansing: No Slough/Fibrino Yes Wound Bed Granulation  Amount: Large (67-100%) Exposed Structure Granulation Quality: Red Fascia Exposed: No Necrotic Amount: Small (1-33%) Fat Layer (Subcutaneous Tissue) Exposed: Yes Necrotic Quality: Adherent Slough Tendon Exposed: No Muscle Exposed: No Joint Exposed: No Bone Exposed: No Treatment Notes Wound #4 (Left Upper Arm) Notes xeroform, abd, conform Electronic Signature(s) Signed: 06/28/2019 11:31:22 AM By: Mannie Stabile ([id number]) Entered By: Army Melia on 06/26/2019 14:34:32 Natividad, [name] ([id number]) -------------------------------------------------------------------------------- Wound Assessment Details Patient Name: [name] Date of Service: 06/26/2019 2:15 PM Medical Record Number: [id number] Patient Account Number: [id number] Date of Birth/Sex: 04-Apr-1920 (86 y.o. F) Treating RN: Army Melia Primary Care Terria Deschepper: Ria Bush Other Clinician: Referring Tapanga Ottaway: Ria Bush Treating Trigger Frasier/Extender: STONE III, HOYT Weeks in Treatment: 0 Wound Status Wound Number: 5 Primary Etiology: Trauma, Other Wound Location: Left Forearm Wound Status: Open Wounding Event: Trauma Date Acquired: 06/12/2019 Weeks Of Treatment: 0 Clustered Wound: No Photos Wound Measurements Length: (cm) 1.8 Width: (cm) 5 Depth: (cm) 0.1 Area: (cm) 7.069 Volume: (cm) 0.707 % Reduction in Area: % Reduction in Volume: Epithelialization: None Tunneling: No Undermining: No Wound Description Classification: Full Thickness Without Exposed Support Structu Exudate Amount: Medium Exudate Type: Serosanguineous Exudate Color: red, brown res Foul Odor After Cleansing: No Wound Bed Granulation Amount: Medium (34-66%) Exposed Structure Granulation Quality: Red Fascia Exposed: No Necrotic Amount: Medium (34-66%) Fat Layer (Subcutaneous Tissue) Exposed: Yes Necrotic Quality: Adherent Slough Tendon Exposed: No Muscle Exposed: No Joint Exposed: No Bone Exposed:  No Treatment Notes Wound #5 (Left Forearm) Notes xeroform, abd, conform Electronic Signature(s) Signed: 06/28/2019 11:31:22 AM By: Mannie Stabile ([id number]) Entered By: Army Melia on 06/26/2019 14:35:54 Leh, [name] ([id number]) -------------------------------------------------------------------------------- Wound Assessment Details Patient Name: [name] Date of Service: 06/26/2019 2:15 PM Medical Record Number: [id number] Patient Account Number: [id number] Date of Birth/Sex: [birth year] (86 y.o. F) Treating RN: Army Melia Primary Care Rosamaria Donn: Ria Bush Other Clinician: Referring Alie Moudy: Ria Bush Treating Thanh Pomerleau/Extender: STONE III, HOYT Weeks in Treatment: 0 Wound Status Wound Number: 6 Primary Etiology: Trauma, Other Wound Location: Left, Posterior Lower Leg Wound Status: Open Wounding Event: Trauma Date Acquired: 06/12/2019 Weeks Of Treatment: 0 Clustered Wound: No Photos Wound Measurements Length: (cm) 1.9 Width: (cm) 1 Depth: (cm) 0.1 Area: (cm) 1.492 Volume: (cm) 0.149 % Reduction in Area: % Reduction in Volume: Epithelialization: None Tunneling: No Undermining: No Wound Description Classification: Full Thickness Without Exposed Support Structu Wound Margin: Flat and Intact Exudate Amount: Medium Exudate Type: Serosanguineous Exudate Color: red, brown res Foul Odor After Cleansing: No Slough/Fibrino Yes Wound Bed Granulation Amount: Medium (34-66%) Exposed Structure Granulation Quality: Red Fascia Exposed: No Necrotic Amount: Medium (34-66%) Fat Layer (Subcutaneous Tissue) Exposed: Yes Necrotic Quality: Adherent Slough Tendon Exposed: No Muscle Exposed: No Joint Exposed: No Bone Exposed: No Treatment Notes Wound #6 (Left, Posterior Lower Leg) Notes xeroform, abd, conform Electronic Signature(s) Signed: 06/28/2019 11:31:22 AM By: Mannie Stabile ([id number]) Entered By: Army Melia on 06/26/2019 14:36:53 Frigon, [name] ([id number]) -------------------------------------------------------------------------------- Wound Assessment Details Patient Name: [name] Date of Service: 06/26/2019 2:15 PM Medical Record Number: [id number] Patient Account Number: [id number] Date of Birth/Sex: 1920-10-11 (86 y.o. F) Treating RN: Army Melia Primary Care Cassandria Drew: Ria Bush Other Clinician: Referring Ettamae Barkett: Ria Bush Treating Josey Forcier/Extender: STONE III, HOYT Weeks in Treatment: 0 Wound Status Wound Number: 7 Primary  Etiology: Trauma, Other Wound Location: Right, Lateral Lower Leg Wound Status: Open Wounding Event: Trauma Date Acquired: 06/12/2019 Weeks Of Treatment: 0 Clustered Wound: No Wound Measurements Length: (cm) 1.5 Width: (cm) 0.3 Depth: (cm) 0.1 Area: (cm) 0.353 Volume: (cm) 0.035 % Reduction in Area: % Reduction in Volume: Epithelialization: None Tunneling: No Undermining: No Wound Description Classification: Full Thickness Without Exposed Support Structu Wound Margin: Flat and Intact Exudate Amount: Medium Exudate Type: Serosanguineous Exudate Color: red, brown res Foul Odor After Cleansing: No Slough/Fibrino Yes Wound Bed Granulation Amount: Medium (34-66%) Exposed Structure Granulation Quality: Red Fascia Exposed: No Necrotic Amount: Medium (34-66%) Fat Layer (Subcutaneous Tissue) Exposed: Yes Necrotic Quality: Adherent Slough Tendon Exposed: No Muscle Exposed: No Joint Exposed: No Bone Exposed: No Treatment Notes Wound #7 (Right, Lateral Lower Leg) Notes xeroform, abd, conform Electronic Signature(s) Signed: 06/28/2019 11:31:22 AM By: Army Melia Entered By: Army Melia on 06/26/2019 14:37:50 Melander, [name] ([id number]) -------------------------------------------------------------------------------- Vitals Details Patient Name: [name] Date of Service: 06/26/2019 2:15 PM Medical Record  Number: [id number] Patient Account Number: [id number] Date of Birth/Sex: March 18, 1920 (86 y.o. F) Treating RN: Montey Hora Primary Care Aladdin Kollmann: Ria Bush Other Clinician: Referring Shalea Tomczak: Ria Bush Treating Kailyn Vanderslice/Extender: STONE III, HOYT Weeks in Treatment: 0 Vital Signs Time Taken: 14:20 Temperature (F): 98.3 Height (in): 62 Pulse (bpm): 86 Source: Stated Respiratory Rate (breaths/min): 16 Weight (lbs): 113 Blood Pressure (mmHg): 154/54 Source: Stated Reference Range: 80 - 120 mg / dl Body Mass Index (BMI): 20.7 Electronic Signature(s) Signed: 06/26/2019 4:34:14 PM By: Lorine Bears RCP, RRT, CHT Entered By: Becky Sax, Amado Nash on 06/26/2019 14:26:23

## 2019-06-28 NOTE — Progress Notes (Signed)
[name], [name] ([id number]) Visit Report for 06/26/2019 Chief Complaint Document Details Patient Name: [name], [name]. Date of Service: 06/26/2019 2:15 PM Medical Record Number: [id number] Patient Account Number: [id number] Date of Birth/Sex: [birth year] (86 y.o. F) Treating RN: Montey Hora Primary Care Provider: Ria Bush Other Clinician: Referring Provider: Ria Bush Treating Provider/Extender: Melburn Hake, Lateefa Crosby Weeks in Treatment: 0 Information Obtained from: Patient Chief Complaint Skin tear on the left upper arm, left forearm, left posterior lower leg, and right lateral lower leg. Electronic Signature(s) Signed: 06/26/2019 2:47:19 PM By: Worthy Keeler PA-C Entered By: Worthy Keeler on 06/26/2019 14:47:19 [name], [name] ([id number]) -------------------------------------------------------------------------------- Debridement Details Patient Name: [name] Date of Service: 06/26/2019 2:15 PM Medical Record Number: [id number] Patient Account Number: [id number] Date of Birth/Sex: 1920/12/14 (86 y.o. F) Treating RN: Montey Hora Primary Care Provider: Ria Bush Other Clinician: Referring Provider: Ria Bush Treating Provider/Extender: Melburn Hake, Emmaleigh Longo Weeks in Treatment: 0 Debridement Performed for Wound #6 Left,Posterior Lower Leg Assessment: Performed By: Physician STONE III, Wilena Tyndall E., PA-C Debridement Type: Debridement Level of Consciousness (Pre- Awake and Alert procedure): Pre-procedure Verification/Time Out Yes - 14:54 Taken: Start Time: 14:54 Pain Control: Lidocaine 4% Topical Solution Total Area Debrided (L x W): 1.9 (cm) x 1 (cm) = 1.9 (cm) Tissue and other material Non-Viable, Skin: Dermis , Skin: Epidermis debrided: Level: Skin/Epidermis Debridement Description: Selective/Open Wound Instrument: Forceps, Scissors Bleeding: Minimum Hemostasis Achieved: Pressure End Time: 14:56 Procedural Pain: 0 Post Procedural  Pain: 0 Response to Treatment: Procedure was tolerated well Level of Consciousness (Post- Awake and Alert procedure): Post Debridement Measurements of Total Wound Length: (cm) 1.9 Width: (cm) 1 Depth: (cm) 0.1 Volume: (cm) 0.149 Character of Wound/Ulcer Post Debridement: Improved Post Procedure Diagnosis Same as Pre-procedure Electronic Signature(s) Signed: 06/26/2019 3:51:15 PM By: Worthy Keeler PA-C Signed: 06/26/2019 4:28:28 PM By: Montey Hora Entered By: Montey Hora on 06/26/2019 14:55:31 [name], [name] ([id number]) -------------------------------------------------------------------------------- Debridement Details Patient Name: [name] Date of Service: 06/26/2019 2:15 PM Medical Record Number: [id number] Patient Account Number: [id number] Date of Birth/Sex: 1920/12/22 (86 y.o. F) Treating RN: Montey Hora Primary Care Provider: Ria Bush Other Clinician: Referring Provider: Ria Bush Treating Provider/Extender: Melburn Hake, Marva Hendryx Weeks in Treatment: 0 Debridement Performed for Wound #4 Left Upper Arm Assessment: Performed By: Physician STONE III, Harshan Kearley E., PA-C Debridement Type: Chemical/Enzymatic/Mechanical Agent Used: saline and gauze Level of Consciousness (Pre- Awake and Alert procedure): Pre-procedure Verification/Time Out Yes - 14:56 Taken: Start Time: 14:56 Pain Control: Lidocaine 4% Topical Solution Instrument: Other : saline and gauze Bleeding: Minimum Hemostasis Achieved: Pressure End Time: 14:57 Procedural Pain: 0 Post Procedural Pain: 0 Response to Treatment: Procedure was tolerated well Level of Consciousness (Post- Awake and Alert procedure): Post Debridement Measurements of Total Wound Length: (cm) 0.7 Width: (cm) 3.5 Depth: (cm) 0.1 Volume: (cm) 0.192 Character of Wound/Ulcer Post Debridement: Improved Post Procedure Diagnosis Same as Pre-procedure Electronic Signature(s) Signed: 06/26/2019 3:51:15 PM By:  Worthy Keeler PA-C Signed: 06/26/2019 4:28:28 PM By: Montey Hora Entered By: Montey Hora on 06/26/2019 14:56:20 [name], [name] ([id number]) -------------------------------------------------------------------------------- Debridement Details Patient Name: [name] Date of Service: 06/26/2019 2:15 PM Medical Record Number: [id number] Patient Account Number: [id number] Date of Birth/Sex: 1920/09/09 (86 y.o. F) Treating RN: Montey Hora Primary Care Provider: Ria Bush Other Clinician: Referring Provider: Ria Bush Treating Provider/Extender: STONE III, Kimmarie Pascale Weeks in Treatment: 0 Debridement Performed for Wound #5 Left Forearm Assessment: Performed By: Physician STONE III, Chalonda Schlatter E., PA-C Debridement Type: Chemical/Enzymatic/Mechanical  Agent Used: saline and gauze Level of Consciousness (Pre- Awake and Alert procedure): Pre-procedure Verification/Time Out Yes - 14:57 Taken: Start Time: 14:57 Pain Control: Lidocaine 4% Topical Solution Instrument: Other : saline and gauze Bleeding: Minimum Hemostasis Achieved: Pressure End Time: 14:58 Procedural Pain: 0 Post Procedural Pain: 0 Response to Treatment: Procedure was tolerated well Level of Consciousness (Post- Awake and Alert procedure): Post Debridement Measurements of Total Wound Length: (cm) 1.8 Width: (cm) 5 Depth: (cm) 0.1 Volume: (cm) 0.707 Character of Wound/Ulcer Post Debridement: Improved Post Procedure Diagnosis Same as Pre-procedure Electronic Signature(s) Signed: 06/26/2019 3:51:15 PM By: Worthy Keeler PA-C Signed: 06/26/2019 4:28:28 PM By: Montey Hora Entered By: Montey Hora on 06/26/2019 14:57:09 [name], [name] ([id number]) -------------------------------------------------------------------------------- HPI Details Patient Name: [name] Date of Service: 06/26/2019 2:15 PM Medical Record Number: [id number] Patient Account Number: [id number] Date of Birth/Sex:  Jun 19, 1920 (86 y.o. F) Treating RN: Montey Hora Primary Care Provider: Ria Bush Other Clinician: Referring Provider: Ria Bush Treating Provider/Extender: Melburn Hake, Tyrihanna Wingert Weeks in Treatment: 0 History of Present Illness HPI Description: 12/15/2018 on evaluation today patient presents for initial evaluation here in our clinic concerning issues that she has been having at multiple locations with skin tears. She had a fall roughly 2 weeks ago where she injured her left upper arm and forearm at that time. Subsequently she ended up as well with a skin tear to her leg when her family members were trying to get her into the bed her skin is so fragile that as she was lifting her legs the skin on the back of the leg tore. That is on the right. Nonetheless they have been trying to manage these I did see her primary care provider earlier in the week who referred her to [name] for further evaluation and treatment. Currently there does not appear to be any signs of active infection at this time which is good news at any site. No fevers, chills, nausea, vomiting, or diarrhea. She does have pain but this is mainly on the leg ulcer the arms are really not to painful at this point. The patient does have a history of dementia, hypertension, congestive heart failure, and chronic kidney disease stage III. 12/29/2018 on evaluation today patient appears to be doing better in regard to her skin tears on the forearm both are healing quite nicely one is close the other is slightly hyper granulated which is probably preventing complete epithelization. With regard to her right lower extremity unfortunately she is still having a lot of dressing material/alginate which is stuck to the wound bed. This seems to be a result of unfortunately having a different type of alginate than what we actually ordered for her. We had ordered silver cell as this was more nonstick unfortunately she received a different alginate  which is breaking down and getting somewhat matted into the wound. In fact I would have to try to get some of this off today if at all possible. Other than that she seems to actually be healing nicely she has much less drainage than previously noted. 01/02/19 on evaluation today patient appears to be doing well with regard to her left forearm and right lower extremity ulcers. The left forearm appears to be completely healed which is great news the right leg is doing much better that there's still some eschar/dressing material stuck to the wound it appears to be healing underneath quite well. The Xeroform did much better for her. 01/09/2019 on evaluation today patient appears to be  doing very well with regard to her right lower extremity ulcer. She has been tolerating the dressing changes without complication. Unfortunately she still has a lot of the alginate dressing stuck to the wound bed that somewhat mad again. It looks like there may be some healing underneath some of these areas but nonetheless I feel like we really need to try to remove that today. This was discussed with the patient's daughter as well. The good news is the patient is really not having a lot of pain. 01/16/2019 upon evaluation today patient appears to be doing well with regard to her wound. In fact this is measuring quite a bit smaller and overall I am very pleased with how things seem to be progressing. The patient seems to be doing well although she does have dementia her family member who is with her today is very pleased with how things seem to be progressing. 01/30/2019 on evaluation today patient appears to be doing excellent in regard to her ulcer. She has been tolerating the dressing changes without complication. Fortunately there is no signs of anything remaining open at this point which is excellent.  Readmission:06/26/2019 upon evaluation today patient presents for reevaluation here in the clinic concerning issues  unfortunately that she has with new wounds to her left forearm, left upper arm, right lower extremity, and left lower extremity. She had 2 different falls which actually resulted in the skin tears as noted here. Fortunately there is no evidence of active infection. I previously have seen her for skin tears at that time as well. She responded extremely well with Xeroform at that point. Alginate did not do well at all for her in fact the alginate that the home health company used at that time got stuck into her wound on the leg and we had to have a fairly significant debridement to remove all this that definitely do not want to go that way again. I completely understand. Nonetheless I think she responded so well to the Xeroform at that time that would be my go to dressing of choice at this point as well in fact this point they have been using it already some of the wounds seem to be doing better. Her left forearm is actually the worst of all the wounds. Electronic Signature(s) Signed: 06/26/2019 3:00:37 PM By: Worthy Keeler PA-C Entered By: Worthy Keeler on 06/26/2019 15:00:36 Prestigiacomo, [name] ([id number]) -------------------------------------------------------------------------------- Physical Exam Details Patient Name: [name] Date of Service: 06/26/2019 2:15 PM Medical Record Number: [id number] Patient Account Number: [id number] Date of Birth/Sex: [birth year] (86 y.o. F) Treating RN: Montey Hora Primary Care Provider: Ria Bush Other Clinician: Referring Provider: Ria Bush Treating Provider/Extender: STONE III, Arissa Fagin Weeks in Treatment: 0 Constitutional patient is hypertensive.. pulse regular and within target range for patient.Marland Kitchen respirations regular, non-labored and within target range for patient.Marland Kitchen temperature within target range for patient.. Thin and well-hydrated in no acute distress. Eyes conjunctiva clear no eyelid edema noted. pupils equal round and  reactive to light and accommodation. Ears, Nose, Mouth, and Throat no gross abnormality of ear auricles or external auditory canals. normal hearing noted during conversation. mucus membranes moist. Respiratory normal breathing without difficulty. Cardiovascular 1+ dorsalis pedis/posterior tibialis pulses. no clubbing, cyanosis, significant edema, <3 sec cap refill. Musculoskeletal Patient unable to walk without assistance. Psychiatric Patient is not able to cooperate in decision making regarding care. Patient has dementia. pleasant and cooperative. Notes Upon inspection patient's wounds again appear to be fairly well at all locations  except for the left posterior lower extremity where she did have some rolled skin along one edge that needed to be cleared away in order to allow this to heal more appropriately. Fortunately she tolerated this debridement without any pain whatsoever and seem to be doing quite well post debridement. Electronic Signature(s) Signed: 06/26/2019 3:01:31 PM By: Worthy Keeler PA-C Entered By: Worthy Keeler on 06/26/2019 15:01:31 [name], [name] ([id number]) -------------------------------------------------------------------------------- Physician Orders Details Patient Name: [name] Date of Service: 06/26/2019 2:15 PM Medical Record Number: [id number] Patient Account Number: [id number] Date of Birth/Sex: 04-29-1920 (86 y.o. F) Treating RN: Montey Hora Primary Care Provider: Ria Bush Other Clinician: Referring Provider: Ria Bush Treating Provider/Extender: Melburn Hake, Lliam Hoh Weeks in Treatment: 0 Verbal / Phone Orders: No Diagnosis Coding ICD-10 Coding Code Description S41.102A Unspecified open wound of left upper arm, initial encounter S51.802A Unspecified open wound of left forearm, initial encounter S81.802A Unspecified open wound, left lower leg, initial encounter S81.801A Unspecified open wound, right lower leg, initial  encounter F03.90 Unspecified dementia without behavioral disturbance I10 Essential (primary) hypertension [id number] Chronic diastolic (congestive) heart failure N18.30 Chronic kidney disease, stage 3 unspecified Wound Cleansing Wound #4 Left Upper Arm o Clean wound with Normal Saline. o May Shower, gently pat wound dry prior to applying new dressing. Wound #5 Left Forearm o Clean wound with Normal Saline. o May Shower, gently pat wound dry prior to applying new dressing. Wound #6 Left,Posterior Lower Leg o Clean wound with Normal Saline. o May Shower, gently pat wound dry prior to applying new dressing. Wound #7 Right,Lateral Lower Leg o Clean wound with Normal Saline. o May Shower, gently pat wound dry prior to applying new dressing. Primary Wound Dressing Wound #4 Left Upper Arm o Xeroform Wound #5 Left Forearm o Xeroform Wound #6 Left,Posterior Lower Leg o Xeroform Wound #7 Right,Lateral Lower Leg o Xeroform Secondary Dressing Wound #4 Left Upper Arm o ABD and Kerlix/Conform - secure with netting Wound #5 Left Forearm o ABD and Kerlix/Conform - secure with netting Wound #6 Left,Posterior Lower Leg o ABD and Kerlix/Conform - secure with netting Wound #7 Right,Lateral Lower Leg o ABD and Kerlix/Conform - secure with netting Dressing Change Frequency Basham, Donovan W. ([id number]) Wound #4 Left Upper Arm o Change dressing every other day. Wound #5 Left Forearm o Change dressing every other day. Wound #6 Left,Posterior Lower Leg o Change dressing every other day. Wound #7 Right,Lateral Lower Leg o Change dressing every other day. Follow-up Appointments o Return Appointment in 1 week. Edema Control Wound #6 Left,Posterior Lower Leg o Elevate legs to the level of the heart and pump ankles as often as possible Wound #7 Right,Lateral Lower Leg o Elevate legs to the level of the heart and pump ankles as often as possible Home  Health Wound #4 Left Upper Arm o Continue Home Health Visits - [facility] please call [facility] with a fax number for orders o Gerty Nurse may visit PRN to address patientos wound care needs. o FACE TO FACE ENCOUNTER: MEDICARE and MEDICAID PATIENTS: I certify that this patient is under my care and that I had a face-to-face encounter that meets the physician face-to-face encounter requirements with this patient on this date. The encounter with the patient was in whole or in part for the following MEDICAL CONDITION: (primary reason for [place]) MEDICAL NECESSITY: I certify, that based on my findings, NURSING services are a medically necessary home health service. HOME BOUND STATUS: I certify that my clinical  findings support that this patient is homebound (i.e., Due to illness or injury, pt requires aid of supportive devices such as crutches, cane, wheelchairs, walkers, the use of special transportation or the assistance of another person to leave their place of residence. There is a normal inability to leave the home and doing so requires considerable and taxing effort. Other absences are for medical reasons / religious services and are infrequent or of short duration when for other reasons). o If current dressing causes regression in wound condition, may D/C ordered dressing product/s and apply Normal Saline Moist Dressing daily until next [place] / Other MD appointment. [place] of regression in wound condition at [phone]. o Please direct any NON-WOUND related issues/requests for orders to patient's Primary Care Physician Wound #5 Left Forearm o [place] Visits - [facility] please call [facility] with a fax number for orders o Home Health Nurse may visit PRN to address patientos wound care needs. o FACE TO FACE ENCOUNTER: MEDICARE and MEDICAID PATIENTS: I certify that this patient is under my care and that I had a face-to-face  encounter that meets the physician face-to-face encounter requirements with this patient on this date. The encounter with the patient was in whole or in part for the following MEDICAL CONDITION: (primary reason for [place]) MEDICAL NECESSITY: I certify, that based on my findings, NURSING services are a medically necessary home health service. HOME BOUND STATUS: I certify that my clinical findings support that this patient is homebound (i.e., Due to illness or injury, pt requires aid of supportive devices such as crutches, cane, wheelchairs, walkers, the use of special transportation or the assistance of another person to leave their place of residence. There is a normal inability to leave the home and doing so requires considerable and taxing effort. Other absences are for medical reasons / religious services and are infrequent or of short duration when for other reasons). o If current dressing causes regression in wound condition, may D/C ordered dressing product/s and apply Normal Saline Moist Dressing daily until next [place] / Other MD appointment. [place] of regression in wound condition at [phone]. o Please direct any NON-WOUND related issues/requests for orders to patient's Primary Care Physician Wound #6 [place] Visits - [facility] please call [facility] with a fax number for orders o Home Health Nurse may visit PRN to address patientos wound care needs. o FACE TO FACE ENCOUNTER: MEDICARE and MEDICAID PATIENTS: I certify that this patient is under my care and that I had a face-to-face encounter that meets the physician face-to-face encounter requirements with this patient on this date. The encounter with the patient was in whole or in part for the following MEDICAL CONDITION: (primary reason for [place]) MEDICAL NECESSITY: I certify, that based on my findings, NURSING services are a medically  necessary home health service. HOME BOUND STATUS: I certify that my clinical findings support that this patient is homebound (i.e., Due to illness or injury, pt requires aid of supportive devices such as crutches, cane, wheelchairs, walkers, the use of special transportation or the assistance of another person to leave their place of residence. There is a normal inability to leave the home and doing so requires considerable and taxing effort. Other absences are for medical reasons / religious services and are infrequent or of short duration when for other reasons). o If current dressing causes regression in wound condition, may D/C ordered  dressing product/s and apply Normal Saline Moist Dressing daily until next [place] / Other MD appointment. [place] of regression in wound condition at [phone]. o Please direct any NON-WOUND related issues/requests for orders to patient's Primary Care Physician Wound #7 [place] Visits - [facility] please call [facility] with a fax number for orders [name], [name] ([id number]) o [place] Nurse may visit PRN to address patientos wound care needs. o FACE TO FACE ENCOUNTER: MEDICARE and MEDICAID PATIENTS: I certify that this patient is under my care and that I had a face-to-face encounter that meets the physician face-to-face encounter requirements with this patient on this date. The encounter with the patient was in whole or in part for the following MEDICAL CONDITION: (primary reason for [place]) MEDICAL NECESSITY: I certify, that based on my findings, NURSING services are a medically necessary home health service. HOME BOUND STATUS: I certify that my clinical findings support that this patient is homebound (i.e., Due to illness or injury, pt requires aid of supportive devices such as crutches, cane, wheelchairs, walkers, the use of special transportation or the assistance  of another person to leave their place of residence. There is a normal inability to leave the home and doing so requires considerable and taxing effort. Other absences are for medical reasons / religious services and are infrequent or of short duration when for other reasons). o If current dressing causes regression in wound condition, may D/C ordered dressing product/s and apply Normal Saline Moist Dressing daily until next [place] / Other MD appointment. [place] of regression in wound condition at [phone]. o Please direct any NON-WOUND related issues/requests for orders to patient's Primary Care Physician Electronic Signature(s) Signed: 06/26/2019 3:51:15 PM By: Worthy Keeler PA-C Signed: 06/26/2019 4:28:28 PM By: Montey Hora Entered By: Montey Hora on 06/26/2019 15:00:09 [name], [name] ([id number]) -------------------------------------------------------------------------------- Problem List Details Patient Name: [name] Date of Service: 06/26/2019 2:15 PM Medical Record Number: [id number] Patient Account Number: [id number] Date of Birth/Sex: January 13, 1921 (86 y.o. F) Treating RN: Montey Hora Primary Care Provider: Ria Bush Other Clinician: Referring Provider: Ria Bush Treating Provider/Extender: Melburn Hake, Jermika Olden Weeks in Treatment: 0 Active Problems ICD-10 Encounter Code Description Active Date MDM Diagnosis S41.102A Unspecified open wound of left upper arm, initial encounter 06/26/2019 No Yes S51.802A Unspecified open wound of left forearm, initial encounter 06/26/2019 No Yes S81.802A Unspecified open wound, left lower leg, initial encounter 06/26/2019 No Yes S81.801A Unspecified open wound, right lower leg, initial encounter 06/26/2019 No Yes F03.90 Unspecified dementia without behavioral disturbance 06/26/2019 No Yes I10 Essential (primary) hypertension 06/26/2019 No Yes [id number] Chronic diastolic (congestive) heart  failure 06/26/2019 No Yes N18.30 Chronic kidney disease, stage 3 unspecified 06/26/2019 No Yes Inactive Problems Resolved Problems Electronic Signature(s) Signed: 06/26/2019 2:47:08 PM By: Worthy Keeler PA-C Entered By: Worthy Keeler on 06/26/2019 14:47:08 [name], [name] ([id number]) -------------------------------------------------------------------------------- Progress Note Details Patient Name: [name] Date of Service: 06/26/2019 2:15 PM Medical Record Number: [id number] Patient Account Number: [id number] Date of Birth/Sex: 1920/11/21 (86 y.o. F) Treating RN: Montey Hora Primary Care Provider: Ria Bush Other Clinician: Referring Provider: Ria Bush Treating Provider/Extender: Melburn Hake, Comfort Iversen Weeks in Treatment: 0 Subjective Chief Complaint Information obtained from Patient Skin tear on the left upper arm, left forearm, left posterior lower leg, and right lateral lower leg. History of Present Illness (HPI) 12/15/2018 on evaluation today patient presents for initial evaluation  here in our clinic concerning issues that she has been having at multiple locations with skin tears. She had a fall roughly 2 weeks ago where she injured her left upper arm and forearm at that time. Subsequently she ended up as well with a skin tear to her leg when her family members were trying to get her into the bed her skin is so fragile that as she was lifting her legs the skin on the back of the leg tore. That is on the right. Nonetheless they have been trying to manage these I did see her primary care provider earlier in the week who referred her to [name] for further evaluation and treatment. Currently there does not appear to be any signs of active infection at this time which is good news at any site. No fevers, chills, nausea, vomiting, or diarrhea. She does have pain but this is mainly on the leg ulcer the arms are really not to painful at this point. The patient does have a  history of dementia, hypertension, congestive heart failure, and chronic kidney disease stage III. 12/29/2018 on evaluation today patient appears to be doing better in regard to her skin tears on the forearm both are healing quite nicely one is close the other is slightly hyper granulated which is probably preventing complete epithelization. With regard to her right lower extremity unfortunately she is still having a lot of dressing material/alginate which is stuck to the wound bed. This seems to be a result of unfortunately having a different type of alginate than what we actually ordered for her. We had ordered silver cell as this was more nonstick unfortunately she received a different alginate which is breaking down and getting somewhat matted into the wound. In fact I would have to try to get some of this off today if at all possible. Other than that she seems to actually be healing nicely she has much less drainage than previously noted. 01/02/19 on evaluation today patient appears to be doing well with regard to her left forearm and right lower extremity ulcers. The left forearm appears to be completely healed which is great news the right leg is doing much better that there's still some eschar/dressing material stuck to the wound it appears to be healing underneath quite well. The Xeroform did much better for her. 01/09/2019 on evaluation today patient appears to be doing very well with regard to her right lower extremity ulcer. She has been tolerating the dressing changes without complication. Unfortunately she still has a lot of the alginate dressing stuck to the wound bed that somewhat mad again. It looks like there may be some healing underneath some of these areas but nonetheless I feel like we really need to try to remove that today. This was discussed with the patient's daughter as well. The good news is the patient is really not having a lot of pain. 01/16/2019 upon evaluation today  patient appears to be doing well with regard to her wound. In fact this is measuring quite a bit smaller and overall I am very pleased with how things seem to be progressing. The patient seems to be doing well although she does have dementia her family member who is with her today is very pleased with how things seem to be progressing. 01/30/2019 on evaluation today patient appears to be doing excellent in regard to her ulcer. She has been tolerating the dressing changes without complication. Fortunately there is no signs of anything remaining open at this point  which is excellent.  Readmission:06/26/2019 upon evaluation today patient presents for reevaluation here in the clinic concerning issues unfortunately that she has with new wounds to her left forearm, left upper arm, right lower extremity, and left lower extremity. She had 2 different falls which actually resulted in the skin tears as noted here. Fortunately there is no evidence of active infection. I previously have seen her for skin tears at that time as well. She responded extremely well with Xeroform at that point. Alginate did not do well at all for her in fact the alginate that the home health company used at that time got stuck into her wound on the leg and we had to have a fairly significant debridement to remove all this that definitely do not want to go that way again. I completely understand. Nonetheless I think she responded so well to the Xeroform at that time that would be my go to dressing of choice at this point as well in fact this point they have been using it already some of the wounds seem to be doing better. Her left forearm is actually the worst of all the wounds. Patient History Information obtained from Patient. Allergies No Known Allergies Family History Diabetes - Child, Hypertension - Child, Lung Disease - Child, No family history of Cancer, Heart Disease, Hereditary Spherocytosis, Kidney Disease, Seizures, Stroke,  Thyroid Problems, Tuberculosis. Social History Never smoker, Marital Status - Widowed, Alcohol Use - Never, Drug Use - No History, Caffeine Use - Rarely. Medical History Eyes [name], [name]. ([id number]) Denies history of Cataracts, Glaucoma, Optic Neuritis Ear/Nose/Mouth/Throat Denies history of Chronic sinus problems/congestion, Middle ear problems Hematologic/Lymphatic Denies history of Anemia, Hemophilia, Human Immunodeficiency Virus, Lymphedema, Sickle Cell Disease Respiratory Denies history of Aspiration, Asthma, Chronic Obstructive Pulmonary Disease (COPD), Pneumothorax, Sleep Apnea, Tuberculosis Cardiovascular Patient has history of Congestive Heart Failure Denies history of Angina, Arrhythmia, Coronary Artery Disease, Deep Vein Thrombosis, Hypertension, Hypotension, Myocardial Infarction, Peripheral Arterial Disease, Peripheral Venous Disease, Phlebitis, Vasculitis Gastrointestinal Denies history of Cirrhosis , Colitis, Crohn s, Hepatitis A, Hepatitis B, Hepatitis C Endocrine Denies history of Type I Diabetes, Type II Diabetes Genitourinary Denies history of End Stage Renal Disease Immunological Denies history of Lupus Erythematosus, Raynaud s, Scleroderma Integumentary (Skin) Denies history of History of Burn, History of pressure wounds Musculoskeletal Denies history of Gout, Rheumatoid Arthritis, Osteoarthritis, Osteomyelitis Neurologic Denies history of Dementia, Neuropathy, Quadriplegia, Paraplegia, Seizure Disorder Oncologic Denies history of Received Chemotherapy, Received Radiation Psychiatric Denies history of Anorexia/bulimia, Confinement Anxiety Review of Systems (ROS) Constitutional Symptoms (General Health) Denies complaints or symptoms of Fatigue, Fever, Chills, Marked Weight Change. Eyes Denies complaints or symptoms of Dry Eyes, Vision Changes, Glasses / Contacts. Ear/Nose/Mouth/Throat Denies complaints or symptoms of Difficult clearing ears,  Sinusitis. Hematologic/Lymphatic Denies complaints or symptoms of Bleeding / Clotting Disorders, Human Immunodeficiency Virus. Respiratory Denies complaints or symptoms of Chronic or frequent coughs, Shortness of Breath. Cardiovascular Denies complaints or symptoms of Chest pain, LE edema. Gastrointestinal Denies complaints or symptoms of Frequent diarrhea, Nausea, Vomiting. Endocrine Denies complaints or symptoms of Hepatitis, Thyroid disease, Polydypsia (Excessive Thirst). Genitourinary Denies complaints or symptoms of Kidney failure/ Dialysis, Incontinence/dribbling. Immunological Denies complaints or symptoms of Hives, Itching. Integumentary (Skin) Denies complaints or symptoms of Wounds, Bleeding or bruising tendency, Breakdown, Swelling. Musculoskeletal Denies complaints or symptoms of Muscle Pain, Muscle Weakness. Neurologic Denies complaints or symptoms of Numbness/parasthesias, Focal/Weakness. Psychiatric Denies complaints or symptoms of Anxiety, Claustrophobia. Objective Constitutional patient is hypertensive.. pulse regular and within target range for patient.Marland Kitchen  respirations regular, non-labored and within target range for patient.Marland Kitchen temperature within target range for patient.. Thin and well-hydrated in no acute distress. Vitals Time Taken: 2:20 PM, Height: 62 in, Source: Stated, Weight: 113 lbs, Source: Stated, BMI: 20.7, Temperature: 98.3 F, Pulse: 86 bpm, Respiratory Rate: 16 breaths/min, Blood Pressure: 154/54 mmHg. [name], [name] ([id number]) Eyes conjunctiva clear no eyelid edema noted. pupils equal round and reactive to light and accommodation. Ears, Nose, Mouth, and Throat no gross abnormality of ear auricles or external auditory canals. normal hearing noted during conversation. mucus membranes moist. Respiratory normal breathing without difficulty. Cardiovascular 1+ dorsalis pedis/posterior tibialis pulses. no clubbing, cyanosis, significant  edema, Musculoskeletal Patient unable to walk without assistance. Psychiatric Patient is not able to cooperate in decision making regarding care. Patient has dementia. pleasant and cooperative. General Notes: Upon inspection patient's wounds again appear to be fairly well at all locations except for the left posterior lower extremity where she did have some rolled skin along one edge that needed to be cleared away in order to allow this to heal more appropriately. Fortunately she tolerated this debridement without any pain whatsoever and seem to be doing quite well post debridement. Integumentary (Hair, Skin) Wound #4 status is Open. Original cause of wound was Trauma. The wound is located on the Left Upper Arm. The wound measures 0.7cm length x 3.5cm width x 0.1cm depth; 1.924cm^2 area and 0.192cm^3 volume. There is Fat Layer (Subcutaneous Tissue) Exposed exposed. There is no tunneling or undermining noted. There is a medium amount of serosanguineous drainage noted. The wound margin is flat and intact. There is large (67-100%) red granulation within the wound bed. There is a small (1-33%) amount of necrotic tissue within the wound bed including Adherent Slough. Wound #5 status is Open. Original cause of wound was Trauma. The wound is located on the Left Forearm. The wound measures 1.8cm length x 5cm width x 0.1cm depth; 7.069cm^2 area and 0.707cm^3 volume. There is Fat Layer (Subcutaneous Tissue) Exposed exposed. There is no tunneling or undermining noted. There is a medium amount of serosanguineous drainage noted. There is medium (34-66%) red granulation within the wound bed. There is a medium (34-66%) amount of necrotic tissue within the wound bed including Adherent Slough. Wound #6 status is Open. Original cause of wound was Trauma. The wound is located on the Left,Posterior Lower Leg. The wound measures 1.9cm length x 1cm width x 0.1cm depth; 1.492cm^2 area and 0.149cm^3 volume. There is Fat  Layer (Subcutaneous Tissue) Exposed exposed. There is no tunneling or undermining noted. There is a medium amount of serosanguineous drainage noted. The wound margin is flat and intact. There is medium (34-66%) red granulation within the wound bed. There is a medium (34-66%) amount of necrotic tissue within the wound bed including Adherent Slough. Wound #7 status is Open. Original cause of wound was Trauma. The wound is located on the Right,Lateral Lower Leg. The wound measures 1.5cm length x 0.3cm width x 0.1cm depth; 0.353cm^2 area and 0.035cm^3 volume. There is Fat Layer (Subcutaneous Tissue) Exposed exposed. There is no tunneling or undermining noted. There is a medium amount of serosanguineous drainage noted. The wound margin is flat and intact. There is medium (34-66%) red granulation within the wound bed. There is a medium (34-66%) amount of necrotic tissue within the wound bed including Adherent Slough. Assessment Active Problems ICD-10 Unspecified open wound of left upper arm, initial encounter Unspecified open wound of left forearm, initial encounter Unspecified open wound, left lower leg, initial encounter  Unspecified open wound, right lower leg, initial encounter Unspecified dementia without behavioral disturbance Essential (primary) hypertension Chronic diastolic (congestive) heart failure Chronic kidney disease, stage 3 unspecified Procedures Wound #4 Pre-procedure diagnosis of Wound #4 is a Skin Tear located on the Left Upper Arm . There was a Chemical/Enzymatic/Mechanical debridement performed by STONE III, Derril Franek E., PA-C. With the following instrument(s): saline and gauze to remove Non-Viable tissue/material. Material removed includes Skin: Dermis and Skin: Epidermis and after achieving pain control using Lidocaine 4% Topical Solution. Other agent used was saline and gauze. A time out was conducted at 14:56, prior to the start of the procedure. A Minimum amount of bleeding  was controlled with Pressure. The procedure was tolerated well with a pain level of 0 throughout and a pain level of 0 following the procedure. Post Debridement Measurements: 0.7cm length x 3.5cm width x 0.1cm depth; 0.192cm^3 volume. [name], [name] ([id number]) Character of Wound/Ulcer Post Debridement is improved. Post procedure Diagnosis Wound #4: Same as Pre-Procedure Wound #5 Pre-procedure diagnosis of Wound #5 is a Trauma, Other located on the Left Forearm . There was a Selective/Open Wound Skin/Epidermis Chemical/Enzymatic/Mechanical performed by STONE III, Khamari Yousuf E., PA-C. With the following instrument(s): saline and gauze to remove Non- Viable tissue/material. Material removed includes Skin: Dermis and Skin: Epidermis and after achieving pain control using Lidocaine 4% Topical Solution. Other agent used was saline and gauze. A time out was conducted at 14:57, prior to the start of the procedure. A Minimum amount of bleeding was controlled with Pressure. The procedure was tolerated well with a pain level of 0 throughout and a pain level of 0 following the procedure. Post Debridement Measurements: 1.8cm length x 5cm width x 0.1cm depth; 0.707cm^3 volume. Character of Wound/Ulcer Post Debridement is improved. Post procedure Diagnosis Wound #5: Same as Pre-Procedure Wound #6 Pre-procedure diagnosis of Wound #6 is a Trauma, Other located on the Left,Posterior Lower Leg . There was a Selective/Open Wound Skin/Epidermis Debridement with a total area of 1.9 sq cm performed by STONE III, Marce Charlesworth E., PA-C. With the following instrument(s): Forceps, and Scissors to remove Non-Viable tissue/material. Material removed includes Skin: Dermis and Skin: Epidermis and after achieving pain control using Lidocaine 4% Topical Solution. No specimens were taken. A time out was conducted at 14:54, prior to the start of the procedure. A Minimum amount of bleeding was controlled with Pressure. The procedure was  tolerated well with a pain level of 0 throughout and a pain level of 0 following the procedure. Post Debridement Measurements: 1.9cm length x 1cm width x 0.1cm depth; 0.149cm^3 volume. Character of Wound/Ulcer Post Debridement is improved. Post procedure Diagnosis Wound #6: Same as Pre-Procedure Plan Wound Cleansing: Wound #4 Left Upper Arm: Clean wound with Normal Saline. May Shower, gently pat wound dry prior to applying new dressing. Wound #5 Left Forearm: Clean wound with Normal Saline. May Shower, gently pat wound dry prior to applying new dressing. Wound #6 Left,Posterior Lower Leg: Clean wound with Normal Saline. May Shower, gently pat wound dry prior to applying new dressing. Wound #7 Right,Lateral Lower Leg: Clean wound with Normal Saline. May Shower, gently pat wound dry prior to applying new dressing. Primary Wound Dressing: Wound #4 Left Upper Arm: Xeroform Wound #5 Left Forearm: Xeroform Wound #6 Left,Posterior Lower Leg: Xeroform Wound #7 Right,Lateral Lower Leg: Xeroform Secondary Dressing: Wound #4 Left Upper Arm: ABD and Kerlix/Conform - secure with netting Wound #5 Left Forearm: ABD and Kerlix/Conform - secure with netting Wound #6 Left,Posterior Lower Leg: ABD  and Kerlix/Conform - secure with netting Wound #7 Right,Lateral Lower Leg: ABD and Kerlix/Conform - secure with netting Dressing Change Frequency: Wound #4 Left Upper Arm: Change dressing every other day. Wound #5 Left Forearm: Change dressing every other day. Wound #6 Left,Posterior Lower Leg: Change dressing every other day. Wound #7 Right,Lateral Lower Leg: Change dressing every other day. Follow-up Appointments: Return Appointment in 1 week. Edema Control: Wound #6 Left,Posterior Lower Leg: Elevate legs to the level of the heart and pump ankles as often as possible Wound #7 Right,Lateral Lower Leg: Blasco, Sibbie W. ([id number]) Elevate legs to the level of the heart and pump ankles as  often as possible Home Health: Wound #4 Left Upper Arm: Continue Home Health Visits - [facility] please call [facility] with a fax number for orders Home Health Nurse may visit PRN to address patient s wound care needs. FACE TO FACE ENCOUNTER: MEDICARE and MEDICAID PATIENTS: I certify that this patient is under my care and that I had a face-to-face encounter that meets the physician face-to-face encounter requirements with this patient on this date. The encounter with the patient was in whole or in part for the following MEDICAL CONDITION: (primary reason for [place]) MEDICAL NECESSITY: I certify, that based on my findings, NURSING services are a medically necessary home health service. HOME BOUND STATUS: I certify that my clinical findings support that this patient is homebound (i.e., Due to illness or injury, pt requires aid of supportive devices such as crutches, cane, wheelchairs, walkers, the use of special transportation or the assistance of another person to leave their place of residence. There is a normal inability to leave the home and doing so requires considerable and taxing effort. Other absences are for medical reasons / religious services and are infrequent or of short duration when for other reasons). If current dressing causes regression in wound condition, may D/C ordered dressing product/s and apply Normal Saline Moist Dressing daily until next [place] / Other MD appointment. [place] of regression in wound condition at [phone]. Please direct any NON-WOUND related issues/requests for orders to patient's Primary Care Physician Wound #5 Left Forearm: [place] Visits - [facility] please call [facility] with a fax number for orders Home Health Nurse may visit PRN to address patient s wound care needs. FACE TO FACE ENCOUNTER: MEDICARE and MEDICAID PATIENTS: I certify that this patient is under my care and that I had a face-to-face encounter  that meets the physician face-to-face encounter requirements with this patient on this date. The encounter with the patient was in whole or in part for the following MEDICAL CONDITION: (primary reason for [place]) MEDICAL NECESSITY: I certify, that based on my findings, NURSING services are a medically necessary home health service. HOME BOUND STATUS: I certify that my clinical findings support that this patient is homebound (i.e., Due to illness or injury, pt requires aid of supportive devices such as crutches, cane, wheelchairs, walkers, the use of special transportation or the assistance of another person to leave their place of residence. There is a normal inability to leave the home and doing so requires considerable and taxing effort. Other absences are for medical reasons / religious services and are infrequent or of short duration when for other reasons). If current dressing causes regression in wound condition, may D/C ordered dressing product/s and apply Normal Saline Moist Dressing daily until next [place] / Other MD appointment. [place] of regression in wound  condition at [phone]. Please direct any NON-WOUND related issues/requests for orders to patient's Primary Care Physician Wound #6 Left,Posterior Lower Leg: [place] Visits - [facility] please call [facility] with a fax number for orders Home Health Nurse may visit PRN to address patient s wound care needs. FACE TO FACE ENCOUNTER: MEDICARE and MEDICAID PATIENTS: I certify that this patient is under my care and that I had a face-to-face encounter that meets the physician face-to-face encounter requirements with this patient on this date. The encounter with the patient was in whole or in part for the following MEDICAL CONDITION: (primary reason for [place]) MEDICAL NECESSITY: I certify, that based on my findings, NURSING services are a medically necessary home health service.  HOME BOUND STATUS: I certify that my clinical findings support that this patient is homebound (i.e., Due to illness or injury, pt requires aid of supportive devices such as crutches, cane, wheelchairs, walkers, the use of special transportation or the assistance of another person to leave their place of residence. There is a normal inability to leave the home and doing so requires considerable and taxing effort. Other absences are for medical reasons / religious services and are infrequent or of short duration when for other reasons). If current dressing causes regression in wound condition, may D/C ordered dressing product/s and apply Normal Saline Moist Dressing daily until next [place] / Other MD appointment. [place] of regression in wound condition at [phone]. Please direct any NON-WOUND related issues/requests for orders to patient's Primary Care Physician Wound #7 Right,Lateral Lower Leg: [place] Visits - [facility] please call [facility] with a fax number for orders Home Health Nurse may visit PRN to address patient s wound care needs. FACE TO FACE ENCOUNTER: MEDICARE and MEDICAID PATIENTS: I certify that this patient is under my care and that I had a face-to-face encounter that meets the physician face-to-face encounter requirements with this patient on this date. The encounter with the patient was in whole or in part for the following MEDICAL CONDITION: (primary reason for [place]) MEDICAL NECESSITY: I certify, that based on my findings, NURSING services are a medically necessary home health service. HOME BOUND STATUS: I certify that my clinical findings support that this patient is homebound (i.e., Due to illness or injury, pt requires aid of supportive devices such as crutches, cane, wheelchairs, walkers, the use of special transportation or the assistance of another person to leave their place of residence. There is a normal inability to  leave the home and doing so requires considerable and taxing effort. Other absences are for medical reasons / religious services and are infrequent or of short duration when for other reasons). If current dressing causes regression in wound condition, may D/C ordered dressing product/s and apply Normal Saline Moist Dressing daily until next [place] / Other MD appointment. [place] of regression in wound condition at [phone]. Please direct any NON-WOUND related issues/requests for orders to patient's Primary Care Physician 1. My suggestion at this time is good to be that we go ahead and initiate treatment with a continuation of the Xeroform gauze dressing that the family is been using which was left over from the previous time she was seen here in the clinic. 2. I am also can recommend at this time that we continue to be very cautious and allowing her to get up and move around as again I do not want her to fall or sustain  further trauma. 3. With regard to infection I do not see any evidence of active infection at this time. Obviously I like to keep a close eye on things but nonetheless I think she seems to be doing well hopefully Xeroform to help this not only heal but prevent infection going forward as well. We will see patient back for reevaluation in 1 week here in the clinic. If anything worsens or changes patient will contact our office for additional recommendations. Electronic Signature(s) Signed: 06/26/2019 3:03:01 PM By: Worthy Keeler PA-C Entered By: Worthy Keeler on 06/26/2019 15:03:01 Bagnall, [name] ([id number]) -------------------------------------------------------------------------------- ROS/PFSH Details Patient Name: [name] Date of Service: 06/26/2019 2:15 PM Medical Record Number: [id number] Patient Account Number: [id number] Date of Birth/Sex: November 05, 1920 (86 y.o. F) Treating RN: Army Melia Primary Care Provider: Ria Bush Other Clinician: Referring Provider: Ria Bush Treating Provider/Extender: Melburn Hake, Maricela Schreur Weeks in Treatment: 0 Information Obtained From Patient Constitutional Symptoms (General Health) Complaints and Symptoms: Negative for: Fatigue; Fever; Chills; Marked Weight Change Eyes Complaints and Symptoms: Negative for: Dry Eyes; Vision Changes; Glasses / Contacts Medical History: Negative for: Cataracts; Glaucoma; Optic Neuritis Ear/Nose/Mouth/Throat Complaints and Symptoms: Negative for: Difficult clearing ears; Sinusitis Medical History: Negative for: Chronic sinus problems/congestion; Middle ear problems Hematologic/Lymphatic Complaints and Symptoms: Negative for: Bleeding / Clotting Disorders; Human Immunodeficiency Virus Medical History: Negative for: Anemia; Hemophilia; Human Immunodeficiency Virus; Lymphedema; Sickle Cell Disease Respiratory Complaints and Symptoms: Negative for: Chronic or frequent coughs; Shortness of Breath Medical History: Negative for: Aspiration; Asthma; Chronic Obstructive Pulmonary Disease (COPD); Pneumothorax; Sleep Apnea; Tuberculosis Cardiovascular Complaints and Symptoms: Negative for: Chest pain; LE edema Medical History: Positive for: Congestive Heart Failure Negative for: Angina; Arrhythmia; Coronary Artery Disease; Deep Vein Thrombosis; Hypertension; Hypotension; Myocardial Infarction; Peripheral Arterial Disease; Peripheral Venous Disease; Phlebitis; Vasculitis Gastrointestinal Complaints and Symptoms: Negative for: Frequent diarrhea; Nausea; Vomiting Medical History: Negative for: Cirrhosis ; Colitis; Crohnos; Hepatitis A; Hepatitis B; Hepatitis C Endocrine Azam, Rhodie W. ([id number]) Complaints and Symptoms: Negative for: Hepatitis; Thyroid disease; Polydypsia (Excessive Thirst) Medical History: Negative for: Type I Diabetes; Type II Diabetes Genitourinary Complaints and Symptoms: Negative for: Kidney failure/  Dialysis; Incontinence/dribbling Medical History: Negative for: End Stage Renal Disease Immunological Complaints and Symptoms: Negative for: Hives; Itching Medical History: Negative for: Lupus Erythematosus; Raynaudos; Scleroderma Integumentary (Skin) Complaints and Symptoms: Negative for: Wounds; Bleeding or bruising tendency; Breakdown; Swelling Medical History: Negative for: History of Burn; History of pressure wounds Musculoskeletal Complaints and Symptoms: Negative for: Muscle Pain; Muscle Weakness Medical History: Negative for: Gout; Rheumatoid Arthritis; Osteoarthritis; Osteomyelitis Neurologic Complaints and Symptoms: Negative for: Numbness/parasthesias; Focal/Weakness Medical History: Negative for: Dementia; Neuropathy; Quadriplegia; Paraplegia; Seizure Disorder Psychiatric Complaints and Symptoms: Negative for: Anxiety; Claustrophobia Medical History: Negative for: Anorexia/bulimia; Confinement Anxiety Oncologic Medical History: Negative for: Received Chemotherapy; Received Radiation Immunizations Pneumococcal Vaccine: Received Pneumococcal Vaccination: Yes Implantable Devices None Family and Social History Cancer: No; Diabetes: Yes - Child; Heart Disease: No; Hereditary Spherocytosis: No; Hypertension: Yes - Child; Kidney Disease: No; Lung Disease: Yes - Child; Seizures: No; Stroke: No; Thyroid Problems: No; Tuberculosis: No; Never smoker; Marital Status - Widowed; Alcohol Use: KILA, LANGNESS. ([id number]) Never; Drug Use: No History; Caffeine Use: Rarely; Financial Concerns: No; Food, Clothing or Shelter Needs: No; Support System Lacking: No; Transportation Concerns: No Electronic Signature(s) Signed: 06/26/2019 3:51:15 PM By: Worthy Keeler PA-C Signed: 06/28/2019 11:31:22 AM By: Army Melia Entered By: Army Melia on 06/26/2019 14:41:07 [name], [name] ([id number]) -------------------------------------------------------------------------------- SuperBill  Details Patient Name:  [name] Date of Service: 06/26/2019 Medical Record Number: [id number] Patient Account Number: [id number] Date of Birth/Sex: 05/29/1920 (86 y.o. F) Treating RN: Montey Hora Primary Care Provider: Ria Bush Other Clinician: Referring Provider: Ria Bush Treating Provider/Extender: Melburn Hake, Trypp Heckmann Weeks in Treatment: 0 Diagnosis Coding ICD-10 Codes Code Description [id number] Unspecified open wound of left upper arm, initial encounter S51.802A Unspecified open wound of left forearm, initial encounter S81.802A Unspecified open wound, left lower leg, initial encounter S81.801A Unspecified open wound, right lower leg, initial encounter F03.90 Unspecified dementia without behavioral disturbance I10 Essential (primary) hypertension [id number] Chronic diastolic (congestive) heart failure N18.30 Chronic kidney disease, stage 3 unspecified Facility Procedures CPT4 Code: [id number] Description: 99213 - WOUND CARE VISIT-LEV 3 EST PT Modifier: Quantity: 1 CPT4 Code: [id number] Description: [id number] - DEBRIDE WOUND 1ST 20 SQ CM OR < Modifier: Quantity: 1 CPT4 Code: Description: ICD-10 Diagnosis Description S81.802A Unspecified open wound, left lower leg, initial encounter Modifier: Quantity: Physician Procedures CPT4 [id number] Description: 99213 - WC PHYS LEVEL 3 - EST PT Modifier: 25 Quantity: 1 CPT4 Code: Description: ICD-10 Diagnosis Description S41.102A Unspecified open wound of left upper arm, initial encounter S51.802A Unspecified open wound of left forearm, initial encounter S81.802A Unspecified open wound, left lower leg, initial encounter S81.801A  Unspecified open wound, right lower leg, initial encounter Modifier: Quantity: CPT4 Code: [id number] Description: 97597 - WC PHYS DEBR WO ANESTH 20 SQ CM Modifier: Quantity: 1 CPT4 Code: Description: ICD-10 Diagnosis Description S81.802A Unspecified open wound, left lower leg, initial  encounter Modifier: Quantity: Electronic Signature(s) Signed: 06/26/2019 3:41:52 PM By: Montey Hora Signed: 06/26/2019 3:51:15 PM By: Worthy Keeler PA-C Previous Signature: 06/26/2019 3:03:40 PM Version By: Worthy Keeler PA-C Previous Signature: 06/26/2019 3:03:27 PM Version By: Worthy Keeler PA-C Entered By: Montey Hora on 06/26/2019 15:41:51

## 2019-06-28 NOTE — Telephone Encounter (Signed)
[name] returned your call in regards to Verbal orders

## 2019-06-28 NOTE — Progress Notes (Signed)
LUMINA, GIANNUZZI ([id number]) Visit Report for 06/26/2019 Abuse/Suicide Risk Screen Details Patient Name: [name], [name] Date of Service: 06/26/2019 2:15 PM Medical Record Number: [id number] Patient Account Number: [id number] Date of Birth/Sex: 02/28/1920 (86 y.o. F) Treating RN: Army Melia Primary Care Asheley Hellberg: Ria Bush Other Clinician: Referring Tailor Lucking: Ria Bush Treating Saunders [place]/Extender: STONE III, HOYT Weeks in Treatment: 0 Abuse/Suicide Risk Screen Items Answer ABUSE RISK SCREEN: Has anyone close to you tried to hurt or harm you recentlyo No Do you feel uncomfortable with anyone in your familyo No Has anyone forced you do things that you didnot want to doo No Electronic Signature(s) Signed: 06/28/2019 11:31:22 AM By: Army Melia Entered By: Army Melia on 06/26/2019 14:41:18 [place], [name] ([id number]) -------------------------------------------------------------------------------- Activities of Daily Living Details Patient Name: [name] Date of Service: 06/26/2019 2:15 PM Medical Record Number: [id number] Patient Account Number: [id number] Date of Birth/Sex: 1920/10/13 (86 y.o. F) Treating RN: Army Melia Primary Care Shaquanna Lycan: Ria Bush Other Clinician: Referring Lachrisha Ziebarth: Ria Bush Treating Sohan Potvin/Extender: Melburn Hake, HOYT Weeks in Treatment: 0 Activities of Daily Living Items Answer Activities of Daily Living (Please select one for each item) Drive Automobile Not Able Take Medications Need Assistance Use Telephone Need Assistance Care for Appearance Need Assistance Use Toilet Need Assistance Bath / Shower Need Assistance Dress Self Need Assistance Feed Self Need Assistance Walk Need Assistance Get In / Out Bed Need Assistance Housework Need Assistance Prepare Meals Need Assistance Handle Money Need Assistance Shop for Self Need Assistance Electronic Signature(s) Signed: 06/28/2019 11:31:22 AM By: Army Melia Entered By: Army Melia on 06/26/2019 14:41:30 [place]cl[place], [name] ([id number]) -------------------------------------------------------------------------------- Education Screening Details Patient Name: [name] Date of Service: 06/26/2019 2:15 PM Medical Record Number: [id number] Patient Account Number: [id number] Date of Birth/Sex: January 31, 1920 (86 y.o. F) Treating RN: Army Melia Primary Care Amar Sippel: Ria Bush Other Clinician: Referring Jaysion Ramseyer: Ria Bush Treating Lash Matulich/Extender: Melburn Hake, HOYT Weeks in Treatment: 0 Primary Learner Assessed: Patient Learning Preferences/Education Level/Primary Language Learning Preference: Explanation, Demonstration Highest Education Level: Grade School Preferred Language: English Cognitive Barrier Language Barrier: No Translator Needed: No Memory Deficit: No Emotional Barrier: No Cultural/Religious Beliefs Affecting Medical Care: No Physical Barrier Impaired Vision: No Impaired Hearing: No Decreased Hand dexterity: No Knowledge/Comprehension Knowledge Level: High Comprehension Level: High Ability to understand written instructions: High Ability to understand verbal instructions: High Motivation Anxiety Level: Calm Cooperation: Cooperative Education Importance: Acknowledges Need Interest in Health Problems: Asks Questions Perception: Coherent Willingness to Engage in Self-Management High Activities: Readiness to Engage in Self-Management High Activities: Electronic Signature(s) Signed: 06/28/2019 11:31:22 AM By: Army Melia Entered By: Army Melia on 06/26/2019 14:41:51 Treichler, [name] ([id number]) -------------------------------------------------------------------------------- Fall Risk Assessment Details Patient Name: [name] Date of Service: 06/26/2019 2:15 PM Medical Record Number: [id number] Patient Account Number: [id number] Date of Birth/Sex: Aug 21, 1920 (86 y.o. F) Treating RN:  Army Melia Primary Care Chala Gul: Ria Bush Other Clinician: Referring Aldrin Engelhard: Ria Bush Treating Arica Bevilac[place]/Extender: Melburn Hake, HOYT Weeks in Treatment: 0 Fall Risk Assessment Items Have you had 2 or more falls in the last 12 monthso 0 No Have you had any fall that resulted in injury in the last 12 monthso 0 No FALLS RISK SCREEN History of falling - immediate or within 3 months 0 No Secondary diagnosis (Do you have 2 or more medical diagnoseso) 0 No Ambulatory aid None/bed rest/wheelchair/nurse 0 No Crutches/cane/walker 0 No Furniture 0 No Intravenous therapy Access/Saline/Heparin Lock 0 No Gait/Transferring Normal/ bed rest/ wheelchair 0 No Weak (short steps  with or without shuffle, stooped but able to lift head while walking, may 0 No seek support from furniture) Impaired (short steps with shuffle, may have difficulty arising from chair, head down, impaired 0 No balance) Mental Status Oriented to own ability 0 No Electronic Signature(s) Signed: 06/28/2019 11:31:22 AM By: Army Melia Entered By: Army Melia on 06/26/2019 14:41:56 [place], [name] ([id number]) -------------------------------------------------------------------------------- Foot Assessment Details Patient Name: [name] Date of Service: 06/26/2019 2:15 PM Medical Record Number: [id number] Patient Account Number: [id number] Date of Birth/Sex: 1921-01-25 (86 y.o. F) Treating RN: Army Melia Primary Care Abri Vacca: Ria Bush Other Clinician: Referring Shariyah Eland: Ria Bush Treating Ameah Chanda/Extender: Melburn Hake, HOYT Weeks in Treatment: 0 Foot Assessment Items [x]  Unable to perform due to altered mental status Site Locations + = Sensation present, - = Sensation absent, C = Callus, U = Ulcer R = Redness, W = Warmth, M = Maceration, PU = Pre-ulcerative lesion F = Fissure, S = Swelling, D = Dryness Assessment Right: Left: Other Deformity: No No Prior Foot Ulcer: No  No Prior Amputation: No No Charcot Joint: No No Ambulatory Status: Gait: Electronic Signature(s) Signed: 06/28/2019 11:31:22 AM By: Army Melia Entered By: Army Melia on 06/26/2019 14:42:12 Watkinson, [name] ([id number]) -------------------------------------------------------------------------------- Nutrition Risk Screening Details Patient Name: [name] Date of Service: 06/26/2019 2:15 PM Medical Record Number: [id number] Patient Account Number: [id number] Date of Birth/Sex: 1920-04-26 (86 y.o. F) Treating RN: Army Melia Primary Care Joniel Graumann: Ria Bush Other Clinician: Referring Annalisa Colonna: Ria Bush Treating Shuna Tabor/Extender: STONE III, HOYT Weeks in Treatment: 0 Height (in): 62 Weight (lbs): 113 Body Mass Index (BMI): 20.7 Nutrition Risk Screening Items Score Screening NUTRITION RISK SCREEN: I have an illness or condition that made me change the kind and/or amount of food I eat 0 No I eat fewer than two meals per day 0 No I eat few fruits and vegetables, or milk products 0 No I have three or more drinks of beer, liquor or wine almost every day 0 No I have tooth or mouth problems that make it hard for me to eat 0 No I don't always have enough money to buy the food I need 0 No I eat alone most of the time 0 No I take three or more different prescribed or over-the-counter drugs a day 0 No Without wanting to, I have lost or gained 10 pounds in the last six months 0 No I am not always physically able to shop, cook and/or feed myself 0 No Nutrition Protocols Good Risk Protocol 0 No interventions needed Moderate Risk Protocol High Risk Proctocol Risk Level: Good Risk Score: 0 Electronic Signature(s) Signed: 06/28/2019 11:31:22 AM By: Army Melia Entered By: Army Melia on 06/26/2019 14:42:02

## 2019-06-29 ENCOUNTER — Telehealth: Payer: Self-pay

## 2019-06-29 NOTE — Telephone Encounter (Signed)
Spoke with Amy informing her Dr. Darnell Level is giving verbal orders for services requested.

## 2019-06-29 NOTE — Telephone Encounter (Signed)
Amy nurse with [facility] left v/m requesting verbal orders for [place] for wound care 1 x a wk for 5 wks and then Amy will reevaluate.

## 2019-06-29 NOTE — Telephone Encounter (Signed)
Agree with this. Thank you.  

## 2019-07-02 ENCOUNTER — Encounter: Payer: Medicare Other | Admitting: Physician Assistant

## 2019-07-02 ENCOUNTER — Other Ambulatory Visit: Payer: Self-pay

## 2019-07-02 DIAGNOSIS — S81802A Unspecified open wound, left lower leg, initial encounter: Secondary | ICD-10-CM | POA: Diagnosis not present

## 2019-07-02 DIAGNOSIS — S80821A Blister (nonthermal), right lower leg, initial encounter: Secondary | ICD-10-CM | POA: Diagnosis not present

## 2019-07-02 DIAGNOSIS — S41112D Laceration without foreign body of left upper arm, subsequent encounter: Secondary | ICD-10-CM | POA: Diagnosis not present

## 2019-07-02 DIAGNOSIS — S81812D Laceration without foreign body, left lower leg, subsequent encounter: Secondary | ICD-10-CM | POA: Diagnosis not present

## 2019-07-02 DIAGNOSIS — S41102A Unspecified open wound of left upper arm, initial encounter: Secondary | ICD-10-CM | POA: Diagnosis not present

## 2019-07-02 DIAGNOSIS — S81811A Laceration without foreign body, right lower leg, initial encounter: Secondary | ICD-10-CM | POA: Diagnosis not present

## 2019-07-02 DIAGNOSIS — S81811D Laceration without foreign body, right lower leg, subsequent encounter: Secondary | ICD-10-CM | POA: Diagnosis not present

## 2019-07-02 DIAGNOSIS — S51802A Unspecified open wound of left forearm, initial encounter: Secondary | ICD-10-CM | POA: Diagnosis not present

## 2019-07-02 DIAGNOSIS — Z85038 Personal history of other malignant neoplasm of large intestine: Secondary | ICD-10-CM | POA: Diagnosis not present

## 2019-07-02 DIAGNOSIS — I5032 Chronic diastolic (congestive) heart failure: Secondary | ICD-10-CM | POA: Diagnosis not present

## 2019-07-02 DIAGNOSIS — S81812A Laceration without foreign body, left lower leg, initial encounter: Secondary | ICD-10-CM | POA: Diagnosis not present

## 2019-07-02 DIAGNOSIS — E039 Hypothyroidism, unspecified: Secondary | ICD-10-CM | POA: Diagnosis not present

## 2019-07-02 DIAGNOSIS — N1832 Chronic kidney disease, stage 3b: Secondary | ICD-10-CM | POA: Diagnosis not present

## 2019-07-02 DIAGNOSIS — S81801A Unspecified open wound, right lower leg, initial encounter: Secondary | ICD-10-CM | POA: Diagnosis not present

## 2019-07-02 DIAGNOSIS — Z86010 Personal history of colonic polyps: Secondary | ICD-10-CM | POA: Diagnosis not present

## 2019-07-02 DIAGNOSIS — E559 Vitamin D deficiency, unspecified: Secondary | ICD-10-CM | POA: Diagnosis not present

## 2019-07-02 DIAGNOSIS — E440 Moderate protein-calorie malnutrition: Secondary | ICD-10-CM | POA: Diagnosis not present

## 2019-07-02 DIAGNOSIS — S51812A Laceration without foreign body of left forearm, initial encounter: Secondary | ICD-10-CM | POA: Diagnosis not present

## 2019-07-02 DIAGNOSIS — I130 Hypertensive heart and chronic kidney disease with heart failure and stage 1 through stage 4 chronic kidney disease, or unspecified chronic kidney disease: Secondary | ICD-10-CM | POA: Diagnosis not present

## 2019-07-02 DIAGNOSIS — S41112A Laceration without foreign body of left upper arm, initial encounter: Secondary | ICD-10-CM | POA: Diagnosis not present

## 2019-07-02 DIAGNOSIS — S51012A Laceration without foreign body of left elbow, initial encounter: Secondary | ICD-10-CM | POA: Diagnosis not present

## 2019-07-02 DIAGNOSIS — N183 Chronic kidney disease, stage 3 unspecified: Secondary | ICD-10-CM | POA: Diagnosis not present

## 2019-07-02 DIAGNOSIS — Z9181 History of falling: Secondary | ICD-10-CM | POA: Diagnosis not present

## 2019-07-02 NOTE — Progress Notes (Addendum)
Traci, Cook ([id number]) Visit Report for 07/02/2019 Chief Complaint Document Details Patient Name: [name], [name] Date of Service: 07/02/2019 2:45 PM Medical Record Number: [id number] Patient Account Number: [id number] Date of Birth/Sex: 24-Nov-1920 (86 y.o. F) Treating RN: Army Melia Primary Care Provider: Ria Bush Other Clinician: Referring Provider: Ria Bush Treating Provider/Extender: Melburn Hake, Aurilla Coulibaly Weeks in Treatment: 0 Information Obtained from: Patient Chief Complaint Skin tear on the left upper arm, left forearm, left posterior lower leg, and right lateral lower leg. Electronic Signature(s) Signed: 07/02/2019 3:03:05 PM By: Worthy Keeler PA-C Entered By: Worthy Keeler on 07/02/2019 15:03:05 Mroczka, [name] ([id number]) -------------------------------------------------------------------------------- HPI Details Patient Name: [name] Date of Service: 07/02/2019 2:45 PM Medical Record Number: [id number] Patient Account Number: [id number] Date of Birth/Sex: 03/10/1920 (86 y.o. F) Treating RN: Army Melia Primary Care Provider: Ria Bush Other Clinician: Referring Provider: Ria Bush Treating Provider/Extender: Melburn Hake, Latesa Fratto Weeks in Treatment: 0 History of Present Illness HPI Description: 12/15/2018 on evaluation today patient presents for initial evaluation here in our clinic concerning issues that she has been having at multiple locations with skin tears. She had a fall roughly 2 weeks ago where she injured her left upper arm and forearm at that time. Subsequently she ended up as well with a skin tear to her leg when her family members were trying to get her into the bed her skin is so fragile that as she was lifting her legs the skin on the back of the leg tore. That is on the right. Nonetheless they have been trying to manage these I did see her primary care provider earlier in the week who referred her to [name] for further  evaluation and treatment. Currently there does not appear to be any signs of active infection at this time which is good news at any site. No fevers, chills, nausea, vomiting, or diarrhea. She does have pain but this is mainly on the leg ulcer the arms are really not to painful at this point. The patient does have a history of dementia, hypertension, congestive heart failure, and chronic kidney disease stage III. 12/29/2018 on evaluation today patient appears to be doing better in regard to her skin tears on the forearm both are healing quite nicely one is close the other is slightly hyper granulated which is probably preventing complete epithelization. With regard to her right lower extremity unfortunately she is still having a lot of dressing material/alginate which is stuck to the wound bed. This seems to be a result of unfortunately having a different type of alginate than what we actually ordered for her. We had ordered silver cell as this was more nonstick unfortunately she received a different alginate which is breaking down and getting somewhat matted into the wound. In fact I would have to try to get some of this off today if at all possible. Other than that she seems to actually be healing nicely she has much less drainage than previously noted. 01/02/19 on evaluation today patient appears to be doing well with regard to her left forearm and right lower extremity ulcers. The left forearm appears to be completely healed which is great news the right leg is doing much better that there's still some eschar/dressing material stuck to the wound it appears to be healing underneath quite well. The Xeroform did much better for her. 01/09/2019 on evaluation today patient appears to be doing very well with regard to her right lower extremity ulcer. She has been tolerating the  dressing changes without complication. Unfortunately she still has a lot of the alginate dressing stuck to the wound bed that  somewhat mad again. It looks like there may be some healing underneath some of these areas but nonetheless I feel like we really need to try to remove that today. This was discussed with the patient's daughter as well. The good news is the patient is really not having a lot of pain. 01/16/2019 upon evaluation today patient appears to be doing well with regard to her wound. In fact this is measuring quite a bit smaller and overall I am very pleased with how things seem to be progressing. The patient seems to be doing well although she does have dementia her family member who is with her today is very pleased with how things seem to be progressing. 01/30/2019 on evaluation today patient appears to be doing excellent in regard to her ulcer. She has been tolerating the dressing changes without complication. Fortunately there is no signs of anything remaining open at this point which is excellent.  Readmission:06/26/2019 upon evaluation today patient presents for reevaluation here in the clinic concerning issues unfortunately that she has with new wounds to her left forearm, left upper arm, right lower extremity, and left lower extremity. She had 2 different falls which actually resulted in the skin tears as noted here. Fortunately there is no evidence of active infection. I previously have seen her for skin tears at that time as well. She responded extremely well with Xeroform at that point. Alginate did not do well at all for her in fact the alginate that the home health company used at that time got stuck into her wound on the leg and we had to have a fairly significant debridement to remove all this that definitely do not want to go that way again. I completely understand. Nonetheless I think she responded so well to the Xeroform at that time that would be my go to dressing of choice at this point as well in fact this point they have been using it already some of the wounds seem to be doing better. Her  left forearm is actually the worst of all the wounds. 07/02/2019 upon evaluation today patient appears to be doing better in regard to most of her original wounds that she had upon last evaluation. Unfortunately the left elbow is a new skin tear that appears to be at least a day old may be 2 but again the skin has not reattached to the point that I think we cannot try to stretch this out and see if we get it reattached. This is not a fresh wound does not right here in the office is not bleeding enough for that and I showed that to the patient's family member as well as he question whether or not getting her into the chair because the skin tear. Again I do not believe there is any way possible that is the case with the way its not actively bleeding at this time. Again I explained that even pulling the skin over there is no guarantee that we can be able to get this to completely reattached but we will give it our best. Electronic Signature(s) Signed: 07/02/2019 3:54:52 PM By: Worthy Keeler PA-C Entered By: Worthy Keeler on 07/02/2019 15:54:51 Geist, [name] ([id number]) -------------------------------------------------------------------------------- Physical Exam Details Patient Name: [name] Date of Service: 07/02/2019 2:45 PM Medical Record Number: [id number] Patient Account Number: [id number] Date of Birth/Sex: [birth year] (86 y.o. F)  Treating RN: Army Melia Primary Care Provider: Ria Bush Other Clinician: Referring Provider: Ria Bush Treating Provider/Extender: STONE III, Martina Brodbeck Weeks in Treatment: 0 Constitutional Well-nourished and well-hydrated in no acute distress. Respiratory normal breathing without difficulty. Psychiatric this patient is able to make decisions and demonstrates good insight into disease process. Alert and Oriented x 3. pleasant and cooperative. Notes patient's wounds in general appear to be doing quite well which is excellent news  there is no signs of active infection at this time and overall very pleased with where things stand. The patient has been tolerating the dressing changes without complication and overall I am very pleased at this time. New skin tear I did attempt to stabilize with Steri-Strips to hold the skin flap in place and hopefully we can get this reattached and prevent her from having undergo complete epithelization of the whole wound. With that being said is left to be determined whether or not the skin flap will survive. Electronic Signature(s) Signed: 07/02/2019 3:55:32 PM By: Worthy Keeler PA-C Entered By: Worthy Keeler on 07/02/2019 15:55:32 Ryland, [name] ([id number]) -------------------------------------------------------------------------------- Physician Orders Details Patient Name: [name] Date of Service: 07/02/2019 2:45 PM Medical Record Number: [id number] Patient Account Number: [id number] Date of Birth/Sex: 12/15/1920 (86 y.o. F) Treating RN: Army Melia Primary Care Provider: Ria Bush Other Clinician: Referring Provider: Ria Bush Treating Provider/Extender: Melburn Hake, Micha Erck Weeks in Treatment: 0 Verbal / Phone Orders: No Diagnosis Coding ICD-10 Coding Code Description S41.102A Unspecified open wound of left upper arm, initial encounter S51.802A Unspecified open wound of left forearm, initial encounter S81.802A Unspecified open wound, left lower leg, initial encounter S81.801A Unspecified open wound, right lower leg, initial encounter F03.90 Unspecified dementia without behavioral disturbance I10 Essential (primary) hypertension B52.08 Chronic diastolic (congestive) heart failure N18.30 Chronic kidney disease, stage 3 unspecified Wound Cleansing Wound #4 Left Upper Arm o Clean wound with Normal Saline. o May Shower, gently pat wound dry prior to applying new dressing. Wound #5 Left Forearm o Clean wound with Normal Saline. o May Shower,  gently pat wound dry prior to applying new dressing. Wound #6 Left,Posterior Lower Leg o Clean wound with Normal Saline. o May Shower, gently pat wound dry prior to applying new dressing. Wound #7 Right,Lateral Lower Leg o Clean wound with Normal Saline. o May Shower, gently pat wound dry prior to applying new dressing. Wound #8 Left Elbow o Clean wound with Normal Saline. o May Shower, gently pat wound dry prior to applying new dressing. Primary Wound Dressing Wound #4 Left Upper Arm o Xeroform Wound #5 Left Forearm o Xeroform Wound #6 Left,Posterior Lower Leg o Xeroform Wound #7 Right,Lateral Lower Leg o Xeroform Wound #8 Left Elbow o Xeroform Secondary Dressing Wound #4 Left Upper Arm o ABD and Kerlix/Conform - secure with netting Wound #5 Left Forearm o ABD and Kerlix/Conform - secure with netting Wound #6 Left,Posterior Lower Leg Halliwell, Deaira W. ([id number]) o ABD and Kerlix/Conform - secure with netting Wound #7 Right,Lateral Lower Leg o ABD and Kerlix/Conform - secure with netting Wound #8 Left Elbow o ABD and Kerlix/Conform - secure with netting Dressing Change Frequency Wound #4 Left Upper Arm o Change dressing every other day. Wound #5 Left Forearm o Change dressing every other day. Wound #6 Left,Posterior Lower Leg o Change dressing every other day. Wound #7 Right,Lateral Lower Leg o Change dressing every other day. Wound #8 Left Elbow o Change dressing every other day. Follow-up Appointments Wound #4 Left Upper Arm o Return  Appointment in 1 week. Wound #5 Left Forearm o Return Appointment in 1 week. Wound #6 Left,Posterior Lower Leg o Return Appointment in 1 week. Wound #7 Right,Lateral Lower Leg o Return Appointment in 1 week. Wound #8 Left Elbow o Return Appointment in 1 week. Edema Control Wound #4 Left Upper Arm o Elevate legs to the level of the heart and pump ankles as often as  possible Wound #5 Left Forearm o Elevate legs to the level of the heart and pump ankles as often as possible Wound #6 Left,Posterior Lower Leg o Elevate legs to the level of the heart and pump ankles as often as possible Wound #7 Right,Lateral Lower Leg o Elevate legs to the level of the heart and pump ankles as often as possible Wound #8 Left Elbow o Elevate legs to the level of the heart and pump ankles as often as possible Home Health Wound #4 Left Upper Arm o [place] Visits - [facility] please call [facility] with a fax number for orders o Home Health Nurse may visit PRN to address patientos wound care needs. o FACE TO FACE ENCOUNTER: MEDICARE and MEDICAID PATIENTS: I certify that this patient is under my care and that I had a face-to-face encounter that meets the physician face-to-face encounter requirements with this patient on this date. The encounter with the patient was in whole or in part for the following MEDICAL CONDITION: (primary reason for [place]) MEDICAL NECESSITY: I certify, that based on my findings, NURSING services are a medically necessary home health service. HOME BOUND STATUS: I certify that my clinical findings support that this patient is homebound (i.e., Due to illness or injury, pt requires aid of supportive devices such as crutches, cane, wheelchairs, walkers, the use of special transportation or the assistance of another person to leave their place of residence. There is a normal inability to leave the home and doing so requires considerable and taxing effort. Other absences are for medical reasons / religious services and are infrequent or of short duration when for other reasons). o If current dressing causes regression in wound condition, may D/C ordered dressing product/s and apply Normal Saline Moist Dressing daily until next [place] / Other MD appointment. [place] of regression in wound condition  at [phone]. o Please direct any NON-WOUND related issues/requests for orders to patient's Primary Care Physician KEYATTA, TOLLES ([id number]) Wound #5 Left Forearm o [place] Visits - [facility] please call [facility] with a fax number for orders o Home Health Nurse may visit PRN to address patientos wound care needs. o FACE TO FACE ENCOUNTER: MEDICARE and MEDICAID PATIENTS: I certify that this patient is under my care and that I had a face-to-face encounter that meets the physician face-to-face encounter requirements with this patient on this date. The encounter with the patient was in whole or in part for the following MEDICAL CONDITION: (primary reason for [place]) MEDICAL NECESSITY: I certify, that based on my findings, NURSING services are a medically necessary home health service. HOME BOUND STATUS: I certify that my clinical findings support that this patient is homebound (i.e., Due to illness or injury, pt requires aid of supportive devices such as crutches, cane, wheelchairs, walkers, the use of special transportation or the assistance of another person to leave their place of residence. There is a normal inability to leave the home and doing so requires considerable and taxing effort. Other absences are for medical reasons / religious services  and are infrequent or of short duration when for other reasons). o If current dressing causes regression in wound condition, may D/C ordered dressing product/s and apply Normal Saline Moist Dressing daily until next [place] / Other MD appointment. [place] of regression in wound condition at [phone]. o Please direct any NON-WOUND related issues/requests for orders to patient's Primary Care Physician Wound #6 [place] Visits - [facility] please call [facility] with a fax number for orders o Home Health Nurse may visit PRN to address patientos  wound care needs. o FACE TO FACE ENCOUNTER: MEDICARE and MEDICAID PATIENTS: I certify that this patient is under my care and that I had a face-to-face encounter that meets the physician face-to-face encounter requirements with this patient on this date. The encounter with the patient was in whole or in part for the following MEDICAL CONDITION: (primary reason for [place]) MEDICAL NECESSITY: I certify, that based on my findings, NURSING services are a medically necessary home health service. HOME BOUND STATUS: I certify that my clinical findings support that this patient is homebound (i.e., Due to illness or injury, pt requires aid of supportive devices such as crutches, cane, wheelchairs, walkers, the use of special transportation or the assistance of another person to leave their place of residence. There is a normal inability to leave the home and doing so requires considerable and taxing effort. Other absences are for medical reasons / religious services and are infrequent or of short duration when for other reasons). o If current dressing causes regression in wound condition, may D/C ordered dressing product/s and apply Normal Saline Moist Dressing daily until next [place] / Other MD appointment. [place] of regression in wound condition at [phone]. o Please direct any NON-WOUND related issues/requests for orders to patient's Primary Care Physician Wound #7 [place] Visits - [facility] please call [facility] with a fax number for orders o Home Health Nurse may visit PRN to address patientos wound care needs. o FACE TO FACE ENCOUNTER: MEDICARE and MEDICAID PATIENTS: I certify that this patient is under my care and that I had a face-to-face encounter that meets the physician face-to-face encounter requirements with this patient on this date. The encounter with the patient was in whole or in part for the  following MEDICAL CONDITION: (primary reason for [place]) MEDICAL NECESSITY: I certify, that based on my findings, NURSING services are a medically necessary home health service. HOME BOUND STATUS: I certify that my clinical findings support that this patient is homebound (i.e., Due to illness or injury, pt requires aid of supportive devices such as crutches, cane, wheelchairs, walkers, the use of special transportation or the assistance of another person to leave their place of residence. There is a normal inability to leave the home and doing so requires considerable and taxing effort. Other absences are for medical reasons / religious services and are infrequent or of short duration when for other reasons). o If current dressing causes regression in wound condition, may D/C ordered dressing product/s and apply Normal Saline Moist Dressing daily until next [place] / Other MD appointment. [place] of regression in wound condition at [phone]. o Please direct any NON-WOUND related issues/requests for orders to patient's Primary Care Physician Wound #8 Left Elbow o [place] Visits - [facility] please call [facility] with a fax number for orders o [place]  Nurse may visit PRN to address patientos wound care needs. o FACE TO FACE ENCOUNTER: MEDICARE and MEDICAID PATIENTS: I certify that this patient is under my care and that I had a face-to-face encounter that meets the physician face-to-face encounter requirements with this patient on this date. The encounter with the patient was in whole or in part for the following MEDICAL CONDITION: (primary reason for [place]) MEDICAL NECESSITY: I certify, that based on my findings, NURSING services are a medically necessary home health service. HOME BOUND STATUS: I certify that my clinical findings support that this patient is homebound (i.e., Due to illness or injury, pt requires aid of  supportive devices such as crutches, cane, wheelchairs, walkers, the use of special transportation or the assistance of another person to leave their place of residence. There is a normal inability to leave the home and doing so requires considerable and taxing effort. Other absences are for medical reasons / religious services and are infrequent or of short duration when for other reasons). o If current dressing causes regression in wound condition, may D/C ordered dressing product/s and apply Normal Saline Moist Dressing daily until next [place] / Other MD appointment. [place] of regression in wound condition at [phone]. o Please direct any NON-WOUND related issues/requests for orders to patient's Primary Care Physician Electronic Signature(s) Signed: 07/02/2019 4:06:31 PM By: Army Melia Signed: 07/02/2019 4:20:46 PM By: Worthy Keeler PA-C Entered By: Army Melia on 07/02/2019 15:27:09 Mallette, [name] ([id number]) -------------------------------------------------------------------------------- Problem List Details Patient Name: [name] Date of Service: 07/02/2019 2:45 PM Medical Record Number: [id number] Patient Account Number: [id number] Date of Birth/Sex: Mar 19, 1920 (86 y.o. F) Treating RN: Army Melia Primary Care Provider: Ria Bush Other Clinician: Referring Provider: Ria Bush Treating Provider/Extender: Melburn Hake, Grayson White Weeks in Treatment: 0 Active Problems ICD-10 Encounter Code Description Active Date MDM Diagnosis S41.102A Unspecified open wound of left upper arm, initial encounter 06/26/2019 No Yes S51.802A Unspecified open wound of left forearm, initial encounter 06/26/2019 No Yes S81.802A Unspecified open wound, left lower leg, initial encounter 06/26/2019 No Yes S81.801A Unspecified open wound, right lower leg, initial encounter 06/26/2019 No Yes F03.90 Unspecified dementia without behavioral disturbance  06/26/2019 No Yes I10 Essential (primary) hypertension 06/26/2019 No Yes V49.44 Chronic diastolic (congestive) heart failure 06/26/2019 No Yes N18.30 Chronic kidney disease, stage 3 unspecified 06/26/2019 No Yes Inactive Problems Resolved Problems Electronic Signature(s) Signed: 07/02/2019 3:02:57 PM By: Worthy Keeler PA-C Entered By: Worthy Keeler on 07/02/2019 15:02:57 [place], [name] ([id number]) -------------------------------------------------------------------------------- Progress Note Details Patient Name: [name] Date of Service: 07/02/2019 2:45 PM Medical Record Number: [id number] Patient Account Number: [id number] Date of Birth/Sex: Sep 01, 1920 (86 y.o. F) Treating RN: Army Melia Primary Care Provider: Ria Bush Other Clinician: Referring Provider: Ria Bush Treating Provider/Extender: Melburn Hake, Larkyn Greenberger Weeks in Treatment: 0 Subjective Chief Complaint Information obtained from Patient Skin tear on the left upper arm, left forearm, left posterior lower leg, and right lateral lower leg. History of Present Illness (HPI) 12/15/2018 on evaluation today patient presents for initial evaluation here in our clinic concerning issues that she has been having at multiple locations with skin tears. She had a fall roughly 2 weeks ago where she injured her left upper arm and forearm at that time. Subsequently she ended up as well with a skin tear to her leg when her family members were trying to get her into the bed her skin is so fragile that as she was lifting  her legs the skin on the back of the leg tore. That is on the right. Nonetheless they have been trying to manage these I did see her primary care provider earlier in the week who referred her to [name] for further evaluation and treatment. Currently there does not appear to be any signs of active infection at this time which is good news at any site. No fevers, chills, nausea, vomiting, or diarrhea. She does have pain  but this is mainly on the leg ulcer the arms are really not to painful at this point. The patient does have a history of dementia, hypertension, congestive heart failure, and chronic kidney disease stage III. 12/29/2018 on evaluation today patient appears to be doing better in regard to her skin tears on the forearm both are healing quite nicely one is close the other is slightly hyper granulated which is probably preventing complete epithelization. With regard to her right lower extremity unfortunately she is still having a lot of dressing material/alginate which is stuck to the wound bed. This seems to be a result of unfortunately having a different type of alginate than what we actually ordered for her. We had ordered silver cell as this was more nonstick unfortunately she received a different alginate which is breaking down and getting somewhat matted into the wound. In fact I would have to try to get some of this off today if at all possible. Other than that she seems to actually be healing nicely she has much less drainage than previously noted. 01/02/19 on evaluation today patient appears to be doing well with regard to her left forearm and right lower extremity ulcers. The left forearm appears to be completely healed which is great news the right leg is doing much better that there's still some eschar/dressing material stuck to the wound it appears to be healing underneath quite well. The Xeroform did much better for her. 01/09/2019 on evaluation today patient appears to be doing very well with regard to her right lower extremity ulcer. She has been tolerating the dressing changes without complication. Unfortunately she still has a lot of the alginate dressing stuck to the wound bed that somewhat mad again. It looks like there may be some healing underneath some of these areas but nonetheless I feel like we really need to try to remove that today. This was discussed with the patient's daughter  as well. The good news is the patient is really not having a lot of pain. 01/16/2019 upon evaluation today patient appears to be doing well with regard to her wound. In fact this is measuring quite a bit smaller and overall I am very pleased with how things seem to be progressing. The patient seems to be doing well although she does have dementia her family member who is with her today is very pleased with how things seem to be progressing. 01/30/2019 on evaluation today patient appears to be doing excellent in regard to her ulcer. She has been tolerating the dressing changes without complication. Fortunately there is no signs of anything remaining open at this point which is excellent.  Readmission:06/26/2019 upon evaluation today patient presents for reevaluation here in the clinic concerning issues unfortunately that she has with new wounds to her left forearm, left upper arm, right lower extremity, and left lower extremity. She had 2 different falls which actually resulted in the skin tears as noted here. Fortunately there is no evidence of active infection. I previously have seen her for skin tears at that  time as well. She responded extremely well with Xeroform at that point. Alginate did not do well at all for her in fact the alginate that the home health company used at that time got stuck into her wound on the leg and we had to have a fairly significant debridement to remove all this that definitely do not want to go that way again. I completely understand. Nonetheless I think she responded so well to the Xeroform at that time that would be my go to dressing of choice at this point as well in fact this point they have been using it already some of the wounds seem to be doing better. Her left forearm is actually the worst of all the wounds. 07/02/2019 upon evaluation today patient appears to be doing better in regard to most of her original wounds that she had upon last evaluation. Unfortunately  the left elbow is a new skin tear that appears to be at least a day old may be 2 but again the skin has not reattached to the point that I think we cannot try to stretch this out and see if we get it reattached. This is not a fresh wound does not right here in the office is not bleeding enough for that and I showed that to the patient's family member as well as he question whether or not getting her into the chair because the skin tear. Again I do not believe there is any way possible that is the case with the way its not actively bleeding at this time. Again I explained that even pulling the skin over there is no guarantee that we can be able to get this to completely reattached but we will give it our best. Bredeson, Mateya W. ([id number]) Objective Constitutional Well-nourished and well-hydrated in no acute distress. Vitals Time Taken: 2:50 PM, Height: 62 in, Weight: 113 lbs, BMI: 20.7, Temperature: 98.1 F, Pulse: 91 bpm, Respiratory Rate: 16 breaths/min, Blood Pressure: 155/65 mmHg. Respiratory normal breathing without difficulty. Psychiatric this patient is able to make decisions and demonstrates good insight into disease process. Alert and Oriented x 3. pleasant and cooperative. General Notes: patient's wounds in general appear to be doing quite well which is excellent news there is no signs of active infection at this time and overall very pleased with where things stand. The patient has been tolerating the dressing changes without complication and overall I am very pleased at this time. New skin tear I did attempt to stabilize with Steri-Strips to hold the skin flap in place and hopefully we can get this reattached and prevent her from having undergo complete epithelization of the whole wound. With that being said is left to be determined whether or not the skin flap will survive. Integumentary (Hair, Skin) Wound #4 status is Open. Original cause of wound was Trauma. The wound is located  on the Left Upper Arm. The wound measures 0.5cm length x 2cm width x 0.1cm depth; 0.785cm^2 area and 0.079cm^3 volume. There is Fat Layer (Subcutaneous Tissue) Exposed exposed. There is a medium amount of serosanguineous drainage noted. The wound margin is flat and intact. There is large (67-100%) red granulation within the wound bed. There is no necrotic tissue within the wound bed. Wound #5 status is Open. Original cause of wound was Trauma. The wound is located on the Left Forearm. The wound measures 1.5cm length x 6cm width x 0.1cm depth; 7.069cm^2 area and 0.707cm^3 volume. There is Fat Layer (Subcutaneous Tissue) Exposed exposed. There is  no tunneling or undermining noted. There is a large amount of serosanguineous drainage noted. The wound margin is flat and intact. There is medium (34-66%) red granulation within the wound bed. There is a small (1-33%) amount of necrotic tissue within the wound bed including Adherent Slough. Wound #6 status is Open. Original cause of wound was Trauma. The wound is located on the Left,Posterior Lower Leg. The wound measures 0.2cm length x 0.7cm width x 0.1cm depth; 0.11cm^2 area and 0.011cm^3 volume. There is Fat Layer (Subcutaneous Tissue) Exposed exposed. There is no tunneling or undermining noted. There is a medium amount of serosanguineous drainage noted. The wound margin is flat and intact. There is medium (34-66%) red granulation within the wound bed. There is a small (1-33%) amount of necrotic tissue within the wound bed including Adherent Slough. Wound #7 status is Open. Original cause of wound was Trauma. The wound is located on the Right,Lateral Lower Leg. The wound measures 0.1cm length x 0.1cm width x 0.1cm depth; 0.008cm^2 area and 0.001cm^3 volume. Wound #8 status is Open. Original cause of wound was Trauma. The wound is located on the Left Elbow. The wound measures 2cm length x 0.8cm width x 0.1cm depth; 1.257cm^2 area and 0.126cm^3 volume.  There is Fat Layer (Subcutaneous Tissue) Exposed exposed. There is a medium amount of serous drainage noted. The wound margin is flat and intact. There is large (67-100%) red granulation within the wound bed. Assessment Active Problems ICD-10 Unspecified open wound of left upper arm, initial encounter Unspecified open wound of left forearm, initial encounter Unspecified open wound, left lower leg, initial encounter Unspecified open wound, right lower leg, initial encounter Unspecified dementia without behavioral disturbance Essential (primary) hypertension Chronic diastolic (congestive) heart failure Chronic kidney disease, stage 3 unspecified Plan Wound Cleansing: Wound #4 Left Upper Arm: Clean wound with Normal Saline. May Shower, gently pat wound dry prior to applying new dressing. LETITIA, SABALA ([id number]) Wound #5 Left Forearm: Clean wound with Normal Saline. May Shower, gently pat wound dry prior to applying new dressing. Wound #6 Left,Posterior Lower Leg: Clean wound with Normal Saline. May Shower, gently pat wound dry prior to applying new dressing. Wound #7 Right,Lateral Lower Leg: Clean wound with Normal Saline. May Shower, gently pat wound dry prior to applying new dressing. Wound #8 Left Elbow: Clean wound with Normal Saline. May Shower, gently pat wound dry prior to applying new dressing. Primary Wound Dressing: Wound #4 Left Upper Arm: Xeroform Wound #5 Left Forearm: Xeroform Wound #6 Left,Posterior Lower Leg: Xeroform Wound #7 Right,Lateral Lower Leg: Xeroform Wound #8 Left Elbow: Xeroform Secondary Dressing: Wound #4 Left Upper Arm: ABD and Kerlix/Conform - secure with netting Wound #5 Left Forearm: ABD and Kerlix/Conform - secure with netting Wound #6 Left,Posterior Lower Leg: ABD and Kerlix/Conform - secure with netting Wound #7 Right,Lateral Lower Leg: ABD and Kerlix/Conform - secure with netting Wound #8 Left Elbow: ABD and Kerlix/Conform -  secure with netting Dressing Change Frequency: Wound #4 Left Upper Arm: Change dressing every other day. Wound #5 Left Forearm: Change dressing every other day. Wound #6 Left,Posterior Lower Leg: Change dressing every other day. Wound #7 Right,Lateral Lower Leg: Change dressing every other day. Wound #8 Left Elbow: Change dressing every other day. Follow-up Appointments: Wound #4 Left Upper Arm: Return Appointment in 1 week. Wound #5 Left Forearm: Return Appointment in 1 week. Wound #6 Left,Posterior Lower Leg: Return Appointment in 1 week. Wound #7 Right,Lateral Lower Leg: Return Appointment in 1 week. Wound #8 Left Elbow:  Return Appointment in 1 week. Edema Control: Wound #4 Left Upper Arm: Elevate legs to the level of the heart and pump ankles as often as possible Wound #5 Left Forearm: Elevate legs to the level of the heart and pump ankles as often as possible Wound #6 Left,Posterior Lower Leg: Elevate legs to the level of the heart and pump ankles as often as possible Wound #7 Right,Lateral Lower Leg: Elevate legs to the level of the heart and pump ankles as often as possible Wound #8 Left Elbow: Elevate legs to the level of the heart and pump ankles as often as possible Home Health: Wound #4 Left Upper Arm: Continue Home Health Visits - [facility] please call [facility] with a fax number for orders Home Health Nurse may visit PRN to address patient s wound care needs. FACE TO FACE ENCOUNTER: MEDICARE and MEDICAID PATIENTS: I certify that this patient is under my care and that I had a face-to-face encounter that meets the physician face-to-face encounter requirements with this patient on this date. The encounter with the patient was in whole or in part for the following MEDICAL CONDITION: (primary reason for [place]) MEDICAL NECESSITY: I certify, that based on my findings, NURSING services are a medically necessary home health service. HOME BOUND STATUS: I certify that  my clinical findings support that this patient is homebound (i.e., Due to illness or injury, pt requires aid of supportive devices such as crutches, cane, wheelchairs, walkers, the use of special transportation or the assistance of another person to leave their place of residence. There is a normal inability to leave the home and doing so requires considerable and taxing effort. Other absences are for medical reasons / religious services and are infrequent or of short duration when for other reasons). If current dressing causes regression in wound condition, may D/C ordered dressing product/s and apply Normal Saline Moist Dressing daily until NUALA, CHILES ([id number]) next [place] / Other MD appointment. [place] of regression in wound condition at [phone]. Please direct any NON-WOUND related issues/requests for orders to patient's Primary Care Physician Wound #5 Left Forearm: [place] Visits - [facility] please call [facility] with a fax number for orders Home Health Nurse may visit PRN to address patient s wound care needs. FACE TO FACE ENCOUNTER: MEDICARE and MEDICAID PATIENTS: I certify that this patient is under my care and that I had a face-to-face encounter that meets the physician face-to-face encounter requirements with this patient on this date. The encounter with the patient was in whole or in part for the following MEDICAL CONDITION: (primary reason for [place]) MEDICAL NECESSITY: I certify, that based on my findings, NURSING services are a medically necessary home health service. HOME BOUND STATUS: I certify that my clinical findings support that this patient is homebound (i.e., Due to illness or injury, pt requires aid of supportive devices such as crutches, cane, wheelchairs, walkers, the use of special transportation or the assistance of another person to leave their place of residence. There is a normal inability to leave the home  and doing so requires considerable and taxing effort. Other absences are for medical reasons / religious services and are infrequent or of short duration when for other reasons). If current dressing causes regression in wound condition, may D/C ordered dressing product/s and apply Normal Saline Moist Dressing daily until next [place] / Other MD appointment. [place] of regression in wound condition at [phone]. Please  direct any NON-WOUND related issues/requests for orders to patient's Primary Care Physician Wound #6 Left,Posterior Lower Leg: [place] Visits - [facility] please call [facility] with a fax number for orders Home Health Nurse may visit PRN to address patient s wound care needs. FACE TO FACE ENCOUNTER: MEDICARE and MEDICAID PATIENTS: I certify that this patient is under my care and that I had a face-to-face encounter that meets the physician face-to-face encounter requirements with this patient on this date. The encounter with the patient was in whole or in part for the following MEDICAL CONDITION: (primary reason for [place]) MEDICAL NECESSITY: I certify, that based on my findings, NURSING services are a medically necessary home health service. HOME BOUND STATUS: I certify that my clinical findings support that this patient is homebound (i.e., Due to illness or injury, pt requires aid of supportive devices such as crutches, cane, wheelchairs, walkers, the use of special transportation or the assistance of another person to leave their place of residence. There is a normal inability to leave the home and doing so requires considerable and taxing effort. Other absences are for medical reasons / religious services and are infrequent or of short duration when for other reasons). If current dressing causes regression in wound condition, may D/C ordered dressing product/s and apply Normal Saline Moist Dressing daily until next [place] / Other MD appointment. [place] of regression in wound condition at [phone]. Please direct any NON-WOUND related issues/requests for orders to patient's Primary Care Physician Wound #7 Right,Lateral Lower Leg: [place] Visits - [facility] please call [facility] with a fax number for orders Home Health Nurse may visit PRN to address patient s wound care needs. FACE TO FACE ENCOUNTER: MEDICARE and MEDICAID PATIENTS: I certify that this patient is under my care and that I had a face-to-face encounter that meets the physician face-to-face encounter requirements with this patient on this date. The encounter with the patient was in whole or in part for the following MEDICAL CONDITION: (primary reason for [place]) MEDICAL NECESSITY: I certify, that based on my findings, NURSING services are a medically necessary home health service. HOME BOUND STATUS: I certify that my clinical findings support that this patient is homebound (i.e., Due to illness or injury, pt requires aid of supportive devices such as crutches, cane, wheelchairs, walkers, the use of special transportation or the assistance of another person to leave their place of residence. There is a normal inability to leave the home and doing so requires considerable and taxing effort. Other absences are for medical reasons / religious services and are infrequent or of short duration when for other reasons). If current dressing causes regression in wound condition, may D/C ordered dressing product/s and apply Normal Saline Moist Dressing daily until next [place] / Other MD appointment. [place] of regression in wound condition at [phone]. Please direct any NON-WOUND related issues/requests for orders to patient's Primary Care Physician Wound #8 Left Elbow: [place] Visits - [facility] please call [facility] with a fax number for orders Home Health Nurse may visit PRN  to address patient s wound care needs. FACE TO FACE ENCOUNTER: MEDICARE and MEDICAID PATIENTS: I certify that this patient is under my care and that I had a face-to-face encounter that meets the physician face-to-face encounter requirements with this patient on this date. The encounter with the patient was in whole or in part for the following MEDICAL CONDITION: (  primary reason for Home Healthcare) MEDICAL NECESSITY: I certify, that based on my findings, NURSING services are a medically necessary home health service. HOME BOUND STATUS: I certify that my clinical findings support that this patient is homebound (i.e., Due to illness or injury, pt requires aid of supportive devices such as crutches, cane, wheelchairs, walkers, the use of special transportation or the assistance of another person to leave their place of residence. There is a normal inability to leave the home and doing so requires considerable and taxing effort. Other absences are for medical reasons / religious services and are infrequent or of short duration when for other reasons). If current dressing causes regression in wound condition, may D/C ordered dressing product/s and apply Normal Saline Moist Dressing daily until next [place] / Other MD appointment. [place] of regression in wound condition at [phone]. Please direct any NON-WOUND related issues/requests for orders to patient's Primary Care Physician 1. I would recommend currently that we continue with Xeroform to all wound locations. 2. I am also can recommend we continue to monitor for infection though I see no evidence of infection at this time that is definitely something to be cognizant of going forward. We will see patient back for reevaluation in 1 week here in the clinic. If anything worsens or changes patient will contact our office for additional recommendations. Electronic Signature(s) Signed: 07/02/2019 3:55:53 PM By: Worthy Keeler PA-C Entered By: Worthy Keeler on 07/02/2019 15:55:52 Pring, [name] ([id number]) -------------------------------------------------------------------------------- SuperBill Details Patient Name: [name] Date of Service: 07/02/2019 Medical Record Number: [id number] Patient Account Number: [id number] Date of Birth/Sex: 1920/06/09 (86 y.o. F) Treating RN: Army Melia Primary Care Provider: Ria Bush Other Clinician: Referring Provider: Ria Bush Treating Provider/Extender: Melburn Hake, Corday Wyka Weeks in Treatment: 0 Diagnosis Coding ICD-10 Codes Code Description S41.102A Unspecified open wound of left upper arm, initial encounter S51.802A Unspecified open wound of left forearm, initial encounter S81.802A Unspecified open wound, left lower leg, initial encounter S81.801A Unspecified open wound, right lower leg, initial encounter F03.90 Unspecified dementia without behavioral disturbance I10 Essential (primary) hypertension T53.20 Chronic diastolic (congestive) heart failure N18.30 Chronic kidney disease, stage 3 unspecified Physician Procedures CPT4 Code: [id number] Description: 99214 - WC PHYS LEVEL 4 - EST PT Modifier: Quantity: 1 CPT4 Code: Description: ICD-10 Diagnosis Description S41.102A Unspecified open wound of left upper arm, initial encounter S51.802A Unspecified open wound of left forearm, initial encounter S81.801A Unspecified open wound, right lower leg, initial encounter S81.802A  Unspecified open wound, left lower leg, initial encounter Modifier: Quantity: Electronic Signature(s) Signed: 07/02/2019 3:56:14 PM By: Worthy Keeler PA-C Entered By: Worthy Keeler on 07/02/2019 15:56:13

## 2019-07-04 DIAGNOSIS — Z85038 Personal history of other malignant neoplasm of large intestine: Secondary | ICD-10-CM | POA: Diagnosis not present

## 2019-07-04 DIAGNOSIS — F0280 Dementia in other diseases classified elsewhere without behavioral disturbance: Secondary | ICD-10-CM

## 2019-07-04 DIAGNOSIS — I130 Hypertensive heart and chronic kidney disease with heart failure and stage 1 through stage 4 chronic kidney disease, or unspecified chronic kidney disease: Secondary | ICD-10-CM | POA: Diagnosis not present

## 2019-07-04 DIAGNOSIS — I5032 Chronic diastolic (congestive) heart failure: Secondary | ICD-10-CM | POA: Diagnosis not present

## 2019-07-04 DIAGNOSIS — S81812D Laceration without foreign body, left lower leg, subsequent encounter: Secondary | ICD-10-CM

## 2019-07-04 DIAGNOSIS — E440 Moderate protein-calorie malnutrition: Secondary | ICD-10-CM | POA: Diagnosis not present

## 2019-07-04 DIAGNOSIS — S81811D Laceration without foreign body, right lower leg, subsequent encounter: Secondary | ICD-10-CM | POA: Diagnosis not present

## 2019-07-04 DIAGNOSIS — Z86010 Personal history of colonic polyps: Secondary | ICD-10-CM

## 2019-07-04 DIAGNOSIS — Z9181 History of falling: Secondary | ICD-10-CM | POA: Diagnosis not present

## 2019-07-04 DIAGNOSIS — N1832 Chronic kidney disease, stage 3b: Secondary | ICD-10-CM | POA: Diagnosis not present

## 2019-07-04 DIAGNOSIS — E039 Hypothyroidism, unspecified: Secondary | ICD-10-CM | POA: Diagnosis not present

## 2019-07-04 DIAGNOSIS — E559 Vitamin D deficiency, unspecified: Secondary | ICD-10-CM

## 2019-07-04 DIAGNOSIS — S41112D Laceration without foreign body of left upper arm, subsequent encounter: Secondary | ICD-10-CM | POA: Diagnosis not present

## 2019-07-04 NOTE — Progress Notes (Signed)
GLORISTINE, TURRUBIATES ([id number]) Visit Report for 07/02/2019 Arrival Information Details Patient Name: [name] Date of Service: 07/02/2019 2:45 PM Medical Record Number: [id number] Patient Account Number: [id number] Date of Birth/Sex: Sep 14, 1920 (86 y.o. F) Treating RN: Army Melia Primary Care Foy Mungia: Ria Bush Other Clinician: Referring Shelbey Spindler: Ria Bush Treating Charlei Ramsaran/Extender: Melburn Hake, HOYT Weeks in Treatment: 0 Visit Information History Since Last Visit Added or deleted any medications: No Patient Arrived: Wheel Chair Any new allergies or adverse reactions: No Arrival Time: 14:54 Had a fall or experienced change in No Accompanied By: son in law activities of daily living that may affect Transfer Assistance: Manual risk of falls: Patient Identification Verified: Yes Signs or symptoms of abuse/neglect since last visito No Secondary Verification Process Completed: Yes Hospitalized since last visit: No Implantable device outside of the clinic excluding No cellular tissue based products placed in the center since last visit: Has Dressing in Place as Prescribed: Yes Pain Present Now: No Electronic Signature(s) Signed: 07/02/2019 4:25:54 PM By: Lorine Bears RCP, RRT, CHT Entered By: Lorine Bears on 07/02/2019 14:56:35 [name] ([id number]) -------------------------------------------------------------------------------- Clinic Level of Care Assessment Details Patient Name: [name] Date of Service: 07/02/2019 2:45 PM Medical Record Number: [id number] Patient Account Number: [id number] Date of Birth/Sex: 1920-03-02 (86 y.o. F) Treating RN: Army Melia Primary Care Eshan Trupiano: Ria Bush Other Clinician: Referring Oria Klimas: Ria Bush Treating Tracy Kinner/Extender: Melburn Hake, HOYT Weeks in Treatment: 0 Clinic Level of Care Assessment Items TOOL 4 Quantity Score []  - Use when only an EandM is  performed on FOLLOW-UP visit 0 ASSESSMENTS - Nursing Assessment / Reassessment X - Reassessment of Co-morbidities (includes updates in patient status) 1 10 X- 1 5 Reassessment of Adherence to Treatment Plan ASSESSMENTS - Wound and Skin Assessment / Reassessment []  - Simple Wound Assessment / Reassessment - one wound 0 X- 5 5 Complex Wound Assessment / Reassessment - multiple wounds []  - 0 Dermatologic / Skin Assessment (not related to wound area) ASSESSMENTS - Focused Assessment []  - Circumferential Edema Measurements - multi extremities 0 []  - 0 Nutritional Assessment / Counseling / Intervention []  - 0 Lower Extremity Assessment (monofilament, tuning fork, pulses) []  - 0 Peripheral Arterial Disease Assessment (using hand held doppler) ASSESSMENTS - Ostomy and/or Continence Assessment and Care []  - Incontinence Assessment and Management 0 []  - 0 Ostomy Care Assessment and Management (repouching, etc.) PROCESS - Coordination of Care X - Simple Patient / Family Education for ongoing care 1 15 []  - 0 Complex (extensive) Patient / Family Education for ongoing care []  - 0 Staff obtains Programmer, systems, Records, Test Results / Process Orders []  - 0 Staff telephones HHA, Nursing Homes / Clarify orders / etc []  - 0 Routine Transfer to another Facility (non-emergent condition) []  - 0 Routine Hospital Admission (non-emergent condition) []  - 0 New Admissions / Biomedical engineer / Ordering NPWT, Apligraf, etc. []  - 0 Emergency Hospital Admission (emergent condition) X- 1 10 Simple Discharge Coordination []  - 0 Complex (extensive) Discharge Coordination PROCESS - Special Needs []  - Pediatric / Minor Patient Management 0 []  - 0 Isolation Patient Management []  - 0 Hearing / Language / Visual special needs []  - 0 Assessment of Community assistance (transportation, D/C planning, etc.) []  - 0 Additional assistance / Altered mentation []  - 0 Support Surface(s) Assessment (bed,  cushion, seat, etc.) INTERVENTIONS - Wound Cleansing / Measurement [name], [name] ([id number]) []  - 0 Simple Wound Cleansing - one wound X- 5 5 Complex Wound Cleansing - multiple  wounds X- 1 5 Wound Imaging (photographs - any number of wounds) []  - 0 Wound Tracing (instead of photographs) []  - 0 Simple Wound Measurement - one wound X- 5 5 Complex Wound Measurement - multiple wounds INTERVENTIONS - Wound Dressings []  - Small Wound Dressing one or multiple wounds 0 X- 5 15 Medium Wound Dressing one or multiple wounds []  - 0 Large Wound Dressing one or multiple wounds []  - 0 Application of Medications - topical []  - 0 Application of Medications - injection INTERVENTIONS - Miscellaneous []  - External ear exam 0 []  - 0 Specimen Collection (cultures, biopsies, blood, body fluids, etc.) []  - 0 Specimen(s) / Culture(s) sent or taken to Lab for analysis []  - 0 Patient Transfer (multiple staff / Civil Service fast streamer / Similar devices) []  - 0 Simple Staple / Suture removal (25 or less) []  - 0 Complex Staple / Suture removal (26 or more) []  - 0 Hypo / Hyperglycemic Management (close monitor of Blood Glucose) []  - 0 Ankle / Brachial Index (ABI) - do not check if billed separately X- 1 5 Vital Signs Has the patient been seen at the hospital within the last three years: Yes Total Score: 200 Level Of Care: New/Established - Level 5 Electronic Signature(s) Signed: 07/02/2019 4:06:31 PM By: Army Melia Entered By: Army Melia on 07/02/2019 15:29:16 [name] ([id number]) -------------------------------------------------------------------------------- Encounter Discharge Information Details Patient Name: [name] Date of Service: 07/02/2019 2:45 PM Medical Record Number: [id number] Patient Account Number: [id number] Date of Birth/Sex: [birth year] (86 y.o. F) Treating RN: Army Melia Primary Care Kanyon Bunn: Ria Bush Other Clinician: Referring Rayyan Orsborn: Ria Bush Treating Aarika Moon/Extender: Melburn Hake, HOYT Weeks in Treatment: 0 Encounter Discharge Information Items Discharge Condition: Stable Ambulatory Status: Wheelchair Discharge Destination: Home Transportation: Private Auto Accompanied By: family Schedule Follow-up Appointment: Yes Clinical Summary of Care: Electronic Signature(s) Signed: 07/02/2019 4:06:31 PM By: Army Melia Entered By: Army Melia on 07/02/2019 15:30:55 Arteaga, [name] ([id number]) -------------------------------------------------------------------------------- Lower Extremity Assessment Details Patient Name: [name] Date of Service: 07/02/2019 2:45 PM Medical Record Number: [id number] Patient Account Number: [id number] Date of Birth/Sex: 21-Jun-1920 (86 y.o. F) Treating RN: Cornell Barman Primary Care Shaylene Paganelli: Ria Bush Other Clinician: Referring Tad Fancher: Ria Bush Treating Devinn Voshell/Extender: Melburn Hake, HOYT Weeks in Treatment: 0 Edema Assessment Assessed: [Left: No] [Right: No] Edema: [Left: Yes] [Right: Yes] Calf Left: Right: Point of Measurement: 32 cm From Medial Instep 32.1 cm 32 cm Ankle Left: Right: Point of Measurement: 10 cm From Medial Instep 21 cm 21 cm Vascular Assessment Pulses: Dorsalis Pedis Palpable: [Left:Yes] [Right:Yes] Electronic Signature(s) Signed: 07/04/2019 7:21:37 AM By: Gretta Cool, BSN, RN, CWS, Kim RN, BSN Entered By: Gretta Cool, BSN, RN, CWS, Kim on 07/02/2019 15:11:42 Mura, [name] ([id number]) -------------------------------------------------------------------------------- Multi Wound Chart Details Patient Name: [name] Date of Service: 07/02/2019 2:45 PM Medical Record Number: [id number] Patient Account Number: [id number] Date of Birth/Sex: 03-15-1920 (86 y.o. F) Treating RN: Army Melia Primary Care Hiba Garry: Ria Bush Other Clinician: Referring Joan Herschberger: Ria Bush Treating Kalah Pflum/Extender: STONE III, HOYT Weeks in  Treatment: 0 Vital Signs Height(in): 62 Pulse(bpm): 91 Weight(lbs): 113 Blood Pressure(mmHg): 155/65 Body Mass Index(BMI): 21 Temperature(F): 98.1 Respiratory Rate(breaths/min): 16 Photos: [4:No Photos] [5:No Photos] [6:No Photos] Wound Location: [4:Left Upper Arm] [5:Left Forearm] [6:Left, Posterior Lower Leg] Wounding Event: [4:Trauma] [5:Trauma] [6:Trauma] Primary Etiology: [4:Skin Tear] [5:Trauma, Other] [6:Trauma, Other] Comorbid History: [4:Congestive Heart Failure] [5:Congestive Heart Failure] [6:Congestive Heart Failure] Date Acquired: [4:06/12/2019] [5:06/12/2019] [6:06/12/2019] Weeks of Treatment: [4:0] [5:0] [6:0] Wound Status: [4:Open] [  5:Open] [6:Open] Measurements L x W x D (cm) [4:0.5x2x0.1] [5:1.5x6x0.1] [6:0.2x0.7x0.1] Area (cm) : [4:0.785] [5:7.069] [6:0.11] Volume (cm) : [4:0.079] [5:0.707] [6:0.011] % Reduction in Area: [4:59.20%] [5:0.00%] [6:92.60%] % Reduction in Volume: [4:58.90%] [5:0.00%] [6:92.60%] Classification: [4:Full Thickness Without Exposed Support Structures] [5:Full Thickness Without Exposed Support Structures] [6:Full Thickness Without Exposed Support Structures] Exudate Amount: [4:Medium] [5:Large] [6:Medium] Exudate Type: [4:Serosanguineous] [5:Serosanguineous] [6:Serosanguineous] Exudate Color: [4:red, brown] [5:red, brown] [6:red, brown] Wound Margin: [4:Flat and Intact] [5:Flat and Intact] [6:Flat and Intact] Granulation Amount: [4:Large (67-100%)] [5:Medium (34-66%)] [6:Medium (34-66%)] Granulation Quality: [4:Red] [5:Red] [6:Red] Necrotic Amount: [4:None Present (0%)] [5:Small (1-33%)] [6:Small (1-33%)] Exposed Structures: [4:Fat Layer (Subcutaneous Tissue) Exposed: Yes Fascia: No Tendon: No Muscle: No Joint: No Bone: No None] [5:Fat Layer (Subcutaneous Tissue) Exposed: Yes Fascia: No Tendon: No Muscle: No Joint: No Bone: No None] [6:Fat Layer (Subcutaneous Tissue)  Exposed: Yes Fascia: No Tendon: No Muscle: No Joint: No Bone: No Small  (1-33%)] Wound Number: 7 8 N/A Photos: No Photos No Photos N/A Wound Location: Right, Lateral Lower Leg Left Elbow N/A Wounding Event: Trauma Trauma N/A Primary Etiology: Trauma, Other Trauma, Other N/A Comorbid History: N/A Congestive Heart Failure N/A Date Acquired: 06/12/2019 07/01/2019 N/A Weeks of Treatment: 0 0 N/A Wound Status: Open Open N/A Measurements L x W x D (cm) 0.1x0.1x0.1 2x0.8x0.1 N/A Area (cm) : 0.008 1.257 N/A Volume (cm) : 0.001 0.126 N/A % Reduction in Area: 97.70% 0.00% N/A % Reduction in Volume: 97.10% 0.00% N/A Classification: Full Thickness Without Exposed Full Thickness Without Exposed N/A Support Structures Support Structures Exudate Amount: N/A Medium N/A Exudate Type: N/A Serous N/A Exudate Color: N/A amber N/A Wound Margin: N/A Flat and Intact N/A Granulation Amount: N/A Large (67-100%) N/A Haslem, Alsha W. ([id number]) Granulation Quality: N/A Red N/A Necrotic Amount: N/A N/A N/A Exposed Structures: N/A Fat Layer (Subcutaneous Tissue) N/A Exposed: Yes Fascia: No Tendon: No Muscle: No Joint: No Bone: No Epithelialization: N/A None N/A Treatment Notes Electronic Signature(s) Signed: 07/02/2019 4:06:31 PM By: Army Melia Entered By: Army Melia on 07/02/2019 15:22:31 Luna, [name] ([id number]) -------------------------------------------------------------------------------- [facility] Details Patient Name: [name] Date of Service: 07/02/2019 2:45 PM Medical Record Number: [id number] Patient Account Number: [id number] Date of Birth/Sex: 03-Apr-1920 (86 y.o. F) Treating RN: Army Melia Primary Care Tima Curet: Ria Bush Other Clinician: Referring Nairi Oswald: Ria Bush Treating Parris Signer/Extender: Melburn Hake, HOYT Weeks in Treatment: 0 Active Inactive Abuse / Safety / Falls / Self Care Management Nursing Diagnoses: Potential for falls Goals: Patient will remain injury free related to falls Date  Initiated: 06/26/2019 Target Resolution Date: 09/29/2019 Goal Status: Active Interventions: Assess fall risk on admission and as needed Notes: Orientation to the Wound Care Program Nursing Diagnoses: Knowledge deficit related to the wound healing center program Goals: Patient/caregiver will verbalize understanding of the [facility] Program Date Initiated: 06/26/2019 Target Resolution Date: 09/29/2019 Goal Status: Active Interventions: Provide education on orientation to the wound center Notes: Wound/Skin Impairment Nursing Diagnoses: Impaired tissue integrity Goals: Ulcer/skin breakdown will heal within 14 weeks Date Initiated: 06/26/2019 Target Resolution Date: 09/29/2019 Goal Status: Active Interventions: Assess patient/caregiver ability to obtain necessary supplies Assess patient/caregiver ability to perform ulcer/skin care regimen upon admission and as needed Assess ulceration(s) every visit Notes: Electronic Signature(s) Signed: 07/02/2019 4:06:31 PM By: Army Melia Entered By: Army Melia on 07/02/2019 15:21:45 Quinton, [name] ([id number]) -------------------------------------------------------------------------------- Pain Assessment Details Patient Name: [name] Date of Service: 07/02/2019 2:45 PM Medical Record Number: [id number] Patient Account Number: [id number]  Date of Birth/Sex: Sep 29, 1920 (86 y.o. F) Treating RN: Cornell Barman Primary Care Dee Paden: Ria Bush Other Clinician: Referring Jaqueline Uber: Ria Bush Treating Cari Burgo/Extender: Melburn Hake, HOYT Weeks in Treatment: 0 Active Problems Location of Pain Severity and Description of Pain Patient Has Paino No Site Locations Pain Management and Medication Current Pain Management: Notes Patient denies pain at this time. Electronic Signature(s) Signed: 07/04/2019 7:21:37 AM By: Gretta Cool, BSN, RN, CWS, Kim RN, BSN Entered By: Gretta Cool, BSN, RN, CWS, Kim on 07/02/2019 14:58:58 [name]  ([id number]) -------------------------------------------------------------------------------- Patient/Caregiver Education Details Patient Name: [name] Date of Service: 07/02/2019 2:45 PM Medical Record Number: [id number] Patient Account Number: [id number] Date of Birth/Gender: [birth year] (86 y.o. F) Treating RN: Army Melia Primary Care Physician: Ria Bush Other Clinician: Referring Physician: Ria Bush Treating Physician/Extender: Sharalyn Ink in Treatment: 0 Education Assessment Education Provided To: Patient Education Topics Provided Wound/Skin Impairment: Handouts: Caring for Your Ulcer Methods: Demonstration, Explain/Verbal Responses: State content correctly Electronic Signature(s) Signed: 07/02/2019 4:06:31 PM By: Army Melia Entered By: Army Melia on 07/02/2019 15:29:51 Azam, [name] ([id number]) -------------------------------------------------------------------------------- Wound Assessment Details Patient Name: [name] Date of Service: 07/02/2019 2:45 PM Medical Record Number: [id number] Patient Account Number: [id number] Date of Birth/Sex: 03-07-1920 (86 y.o. F) Treating RN: Cornell Barman Primary Care Gaylin Bulthuis: Ria Bush Other Clinician: Referring Tamala Manzer: Ria Bush Treating Kelcee Bjorn/Extender: Melburn Hake, HOYT Weeks in Treatment: 0 Wound Status Wound Number: 4 Primary Etiology: Skin Tear Wound Location: Left Upper Arm Wound Status: Open Wounding Event: Trauma Comorbid History: Congestive Heart Failure Date Acquired: 06/12/2019 Weeks Of Treatment: 0 Clustered Wound: No Photos Photo Uploaded By: Gretta Cool, BSN, RN, CWS, Kim on 07/03/2019 10:31:51 Wound Measurements Length: (cm) 0.5 Width: (cm) 2 Depth: (cm) 0.1 Area: (cm) 0.785 Volume: (cm) 0.079 % Reduction in Area: 59.2% % Reduction in Volume: 58.9% Epithelialization: None Wound Description Classification: Full Thickness Without Exposed Support  Structu Wound Margin: Flat and Intact Exudate Amount: Medium Exudate Type: Serosanguineous Exudate Color: red, brown res Foul Odor After Cleansing: No Slough/Fibrino Yes Wound Bed Granulation Amount: Large (67-100%) Exposed Structure Granulation Quality: Red Fascia Exposed: No Necrotic Amount: None Present (0%) Fat Layer (Subcutaneous Tissue) Exposed: Yes Tendon Exposed: No Muscle Exposed: No Joint Exposed: No Bone Exposed: No Treatment Notes Wound #4 (Left Upper Arm) Notes xeroform, ABD, conform Electronic Signature(s) RUARI, DUGGAN ([id number]) Signed: 07/04/2019 7:21:37 AM By: Gretta Cool, BSN, RN, CWS, Kim RN, BSN Entered By: Gretta Cool, BSN, RN, CWS, Kim on 07/02/2019 15:05:00 Winker, [name] ([id number]) -------------------------------------------------------------------------------- Wound Assessment Details Patient Name: [name] Date of Service: 07/02/2019 2:45 PM Medical Record Number: [id number] Patient Account Number: [id number] Date of Birth/Sex: [birth year] (86 y.o. F) Treating RN: Cornell Barman Primary Care Mishael Krysiak: Ria Bush Other Clinician: Referring Areya Lemmerman: Ria Bush Treating Jenavi Beedle/Extender: STONE III, HOYT Weeks in Treatment: 0 Wound Status Wound Number: 5 Primary Etiology: Trauma, Other Wound Location: Left Forearm Wound Status: Open Wounding Event: Trauma Comorbid History: Congestive Heart Failure Date Acquired: 06/12/2019 Weeks Of Treatment: 0 Clustered Wound: No Photos Photo Uploaded By: Gretta Cool, BSN, RN, CWS, Kim on 07/03/2019 10:31:51 Wound Measurements Length: (cm) 1.5 Width: (cm) 6 Depth: (cm) 0.1 Area: (cm) 7.069 Volume: (cm) 0.707 % Reduction in Area: 0% % Reduction in Volume: 0% Epithelialization: None Tunneling: No Undermining: No Wound Description Classification: Full Thickness Without Exposed Support Structu Wound Margin: Flat and Intact Exudate Amount: Large Exudate Type: Serosanguineous Exudate Color:  red, brown res Foul Odor After Cleansing: No Slough/Fibrino Yes Wound Bed Granulation Amount:  Medium (34-66%) Exposed Structure Granulation Quality: Red Fascia Exposed: No Necrotic Amount: Small (1-33%) Fat Layer (Subcutaneous Tissue) Exposed: Yes Necrotic Quality: Adherent Slough Tendon Exposed: No Muscle Exposed: No Joint Exposed: No Bone Exposed: No Treatment Notes Wound #5 (Left Forearm) Notes xeroform, ABD, conform Electronic Signature(s) OLUWASEYI, TULL ([id number]) Signed: 07/04/2019 7:21:37 AM By: Gretta Cool, BSN, RN, CWS, Kim RN, BSN Entered By: Gretta Cool, BSN, RN, CWS, Kim on 07/02/2019 15:05:53 Taulbee, [name] ([id number]) -------------------------------------------------------------------------------- Wound Assessment Details Patient Name: [name] Date of Service: 07/02/2019 2:45 PM Medical Record Number: [id number] Patient Account Number: [id number] Date of Birth/Sex: 12/29/1920 (86 y.o. F) Treating RN: Cornell Barman Primary Care Urbano Milhouse: Ria Bush Other Clinician: Referring Karrine Kluttz: Ria Bush Treating Donavon Kimrey/Extender: Melburn Hake, HOYT Weeks in Treatment: 0 Wound Status Wound Number: 6 Primary Etiology: Trauma, Other Wound Location: Left, Posterior Lower Leg Wound Status: Open Wounding Event: Trauma Comorbid History: Congestive Heart Failure Date Acquired: 06/12/2019 Weeks Of Treatment: 0 Clustered Wound: No Photos Photo Uploaded By: Gretta Cool, BSN, RN, CWS, Kim on 07/03/2019 10:33:20 Wound Measurements Length: (cm) 0.2 Width: (cm) 0.7 Depth: (cm) 0.1 Area: (cm) 0.11 Volume: (cm) 0.011 % Reduction in Area: 92.6% % Reduction in Volume: 92.6% Epithelialization: Small (1-33%) Tunneling: No Undermining: No Wound Description Classification: Full Thickness Without Exposed Support Structu Wound Margin: Flat and Intact Exudate Amount: Medium Exudate Type: Serosanguineous Exudate Color: red, brown res Foul Odor After Cleansing:  No Slough/Fibrino Yes Wound Bed Granulation Amount: Medium (34-66%) Exposed Structure Granulation Quality: Red Fascia Exposed: No Necrotic Amount: Small (1-33%) Fat Layer (Subcutaneous Tissue) Exposed: Yes Necrotic Quality: Adherent Slough Tendon Exposed: No Muscle Exposed: No Joint Exposed: No Bone Exposed: No Treatment Notes Wound #6 (Left, Posterior Lower Leg) Notes xeroform, ABD, conform Electronic Signature(s) LEOMA, FOLDS ([id number]) Signed: 07/04/2019 7:21:37 AM By: Gretta Cool, BSN, RN, CWS, Kim RN, BSN Entered By: Gretta Cool, BSN, RN, CWS, Kim on 07/02/2019 15:06:59 Enderle, [name] ([id number]) -------------------------------------------------------------------------------- Wound Assessment Details Patient Name: [name] Date of Service: 07/02/2019 2:45 PM Medical Record Number: [id number] Patient Account Number: [id number] Date of Birth/Sex: March 24, 1920 (86 y.o. F) Treating RN: Cornell Barman Primary Care Nylah Butkus: Ria Bush Other Clinician: Referring Amyiah Gaba: Ria Bush Treating Bianka Liberati/Extender: STONE III, HOYT Weeks in Treatment: 0 Wound Status Wound Number: 7 Primary Etiology: Trauma, Other Wound Location: Right, Lateral Lower Leg Wound Status: Open Wounding Event: Trauma Date Acquired: 06/12/2019 Weeks Of Treatment: 0 Clustered Wound: No Photos Photo Uploaded By: Gretta Cool, BSN, RN, CWS, Kim on 07/03/2019 10:33:21 Wound Measurements Length: (cm) 0.1 Width: (cm) 0.1 Depth: (cm) 0.1 Area: (cm) 0.008 Volume: (cm) 0.001 % Reduction in Area: 97.7% % Reduction in Volume: 97.1% Wound Description Classification: Full Thickness Without Exposed Support Structu res Treatment Notes Wound #7 (Right, Lateral Lower Leg) Notes xeroform, ABD, conform Electronic Signature(s) Signed: 07/04/2019 7:21:37 AM By: Gretta Cool, BSN, RN, CWS, Kim RN, BSN Entered By: Gretta Cool, BSN, RN, CWS, Kim on 07/02/2019 15:07:32 Tankard, [name]  ([id number]) -------------------------------------------------------------------------------- Wound Assessment Details Patient Name: [name] Date of Service: 07/02/2019 2:45 PM Medical Record Number: [id number] Patient Account Number: [id number] Date of Birth/Sex: [birth year] (86 y.o. F) Treating RN: Cornell Barman Primary Care Sanna Porcaro: Ria Bush Other Clinician: Referring Trevone Prestwood: Ria Bush Treating Yeshua Stryker/Extender: STONE III, HOYT Weeks in Treatment: 0 Wound Status Wound Number: 8 Primary Etiology: Trauma, Other Wound Location: Left Elbow Wound Status: Open Wounding Event: Trauma Comorbid History: Congestive Heart Failure Date Acquired: 07/01/2019 Weeks Of Treatment: 0 Clustered Wound: No Photos Photo Uploaded By: Gretta Cool, BSN,  RN, CWS, Kim on 07/03/2019 10:33:44 Wound Measurements Length: (cm) 2 Width: (cm) 0.8 Depth: (cm) 0.1 Area: (cm) 1.257 Volume: (cm) 0.126 % Reduction in Area: 0% % Reduction in Volume: 0% Epithelialization: None Wound Description Classification: Full Thickness Without Exposed Support Structu Wound Margin: Flat and Intact Exudate Amount: Medium Exudate Type: Serous Exudate Color: amber res Foul Odor After Cleansing: No Slough/Fibrino No Wound Bed Granulation Amount: Large (67-100%) Exposed Structure Granulation Quality: Red Fascia Exposed: No Fat Layer (Subcutaneous Tissue) Exposed: Yes Tendon Exposed: No Muscle Exposed: No Joint Exposed: No Bone Exposed: No Treatment Notes Wound #8 (Left Elbow) Notes xeroform, ABD, conform Electronic Signature(s) KECIA, SWOBODA ([id number]) Signed: 07/04/2019 7:21:37 AM By: Gretta Cool, BSN, RN, CWS, Kim RN, BSN Entered By: Gretta Cool, BSN, RN, CWS, Kim on 07/02/2019 15:04:17 Jacobowitz, [name] ([id number]) -------------------------------------------------------------------------------- Vitals Details Patient Name: [name] Date of Service: 07/02/2019 2:45 PM Medical Record Number:  [id number] Patient Account Number: [id number] Date of Birth/Sex: [birth year] (86 y.o. F) Treating RN: Army Melia Primary Care Boris Engelmann: Ria Bush Other Clinician: Referring Millissa Deese: Ria Bush Treating Antwane Grose/Extender: STONE III, HOYT Weeks in Treatment: 0 Vital Signs Time Taken: 14:50 Temperature (F): 98.1 Height (in): 62 Pulse (bpm): 91 Weight (lbs): 113 Respiratory Rate (breaths/min): 16 Body Mass Index (BMI): 20.7 Blood Pressure (mmHg): 155/65 Reference Range: 80 - 120 mg / dl Electronic Signature(s) Signed: 07/02/2019 4:25:54 PM By: Lorine Bears RCP, RRT, CHT Entered By: Lorine Bears on 07/02/2019 14:57:09

## 2019-07-06 DIAGNOSIS — S81811D Laceration without foreign body, right lower leg, subsequent encounter: Secondary | ICD-10-CM | POA: Diagnosis not present

## 2019-07-06 DIAGNOSIS — S81812D Laceration without foreign body, left lower leg, subsequent encounter: Secondary | ICD-10-CM | POA: Diagnosis not present

## 2019-07-06 DIAGNOSIS — I5032 Chronic diastolic (congestive) heart failure: Secondary | ICD-10-CM | POA: Diagnosis not present

## 2019-07-06 DIAGNOSIS — Z86010 Personal history of colonic polyps: Secondary | ICD-10-CM | POA: Diagnosis not present

## 2019-07-06 DIAGNOSIS — N1832 Chronic kidney disease, stage 3b: Secondary | ICD-10-CM | POA: Diagnosis not present

## 2019-07-06 DIAGNOSIS — E440 Moderate protein-calorie malnutrition: Secondary | ICD-10-CM | POA: Diagnosis not present

## 2019-07-06 DIAGNOSIS — E039 Hypothyroidism, unspecified: Secondary | ICD-10-CM | POA: Diagnosis not present

## 2019-07-06 DIAGNOSIS — E559 Vitamin D deficiency, unspecified: Secondary | ICD-10-CM | POA: Diagnosis not present

## 2019-07-06 DIAGNOSIS — I130 Hypertensive heart and chronic kidney disease with heart failure and stage 1 through stage 4 chronic kidney disease, or unspecified chronic kidney disease: Secondary | ICD-10-CM | POA: Diagnosis not present

## 2019-07-06 DIAGNOSIS — Z85038 Personal history of other malignant neoplasm of large intestine: Secondary | ICD-10-CM | POA: Diagnosis not present

## 2019-07-06 DIAGNOSIS — S41112D Laceration without foreign body of left upper arm, subsequent encounter: Secondary | ICD-10-CM | POA: Diagnosis not present

## 2019-07-06 DIAGNOSIS — Z9181 History of falling: Secondary | ICD-10-CM | POA: Diagnosis not present

## 2019-07-09 DIAGNOSIS — S41112D Laceration without foreign body of left upper arm, subsequent encounter: Secondary | ICD-10-CM | POA: Diagnosis not present

## 2019-07-09 DIAGNOSIS — Z86010 Personal history of colonic polyps: Secondary | ICD-10-CM | POA: Diagnosis not present

## 2019-07-09 DIAGNOSIS — I130 Hypertensive heart and chronic kidney disease with heart failure and stage 1 through stage 4 chronic kidney disease, or unspecified chronic kidney disease: Secondary | ICD-10-CM | POA: Diagnosis not present

## 2019-07-09 DIAGNOSIS — Z9181 History of falling: Secondary | ICD-10-CM | POA: Diagnosis not present

## 2019-07-09 DIAGNOSIS — E039 Hypothyroidism, unspecified: Secondary | ICD-10-CM | POA: Diagnosis not present

## 2019-07-09 DIAGNOSIS — N1832 Chronic kidney disease, stage 3b: Secondary | ICD-10-CM | POA: Diagnosis not present

## 2019-07-09 DIAGNOSIS — S81812D Laceration without foreign body, left lower leg, subsequent encounter: Secondary | ICD-10-CM | POA: Diagnosis not present

## 2019-07-09 DIAGNOSIS — I5032 Chronic diastolic (congestive) heart failure: Secondary | ICD-10-CM | POA: Diagnosis not present

## 2019-07-09 DIAGNOSIS — S81811D Laceration without foreign body, right lower leg, subsequent encounter: Secondary | ICD-10-CM | POA: Diagnosis not present

## 2019-07-09 DIAGNOSIS — Z85038 Personal history of other malignant neoplasm of large intestine: Secondary | ICD-10-CM | POA: Diagnosis not present

## 2019-07-09 DIAGNOSIS — E559 Vitamin D deficiency, unspecified: Secondary | ICD-10-CM | POA: Diagnosis not present

## 2019-07-09 DIAGNOSIS — E440 Moderate protein-calorie malnutrition: Secondary | ICD-10-CM | POA: Diagnosis not present

## 2019-07-10 ENCOUNTER — Telehealth: Payer: Self-pay | Admitting: Hospice

## 2019-07-10 ENCOUNTER — Other Ambulatory Visit: Payer: Self-pay

## 2019-07-10 ENCOUNTER — Encounter: Payer: Medicare Other | Admitting: Physician Assistant

## 2019-07-10 DIAGNOSIS — S80821A Blister (nonthermal), right lower leg, initial encounter: Secondary | ICD-10-CM | POA: Diagnosis not present

## 2019-07-10 DIAGNOSIS — Z9181 History of falling: Secondary | ICD-10-CM | POA: Diagnosis not present

## 2019-07-10 DIAGNOSIS — S81811A Laceration without foreign body, right lower leg, initial encounter: Secondary | ICD-10-CM | POA: Diagnosis not present

## 2019-07-10 DIAGNOSIS — N1832 Chronic kidney disease, stage 3b: Secondary | ICD-10-CM | POA: Diagnosis not present

## 2019-07-10 DIAGNOSIS — S81812A Laceration without foreign body, left lower leg, initial encounter: Secondary | ICD-10-CM | POA: Diagnosis not present

## 2019-07-10 DIAGNOSIS — I5032 Chronic diastolic (congestive) heart failure: Secondary | ICD-10-CM | POA: Diagnosis not present

## 2019-07-10 DIAGNOSIS — Z85038 Personal history of other malignant neoplasm of large intestine: Secondary | ICD-10-CM | POA: Diagnosis not present

## 2019-07-10 DIAGNOSIS — S81812D Laceration without foreign body, left lower leg, subsequent encounter: Secondary | ICD-10-CM | POA: Diagnosis not present

## 2019-07-10 DIAGNOSIS — I130 Hypertensive heart and chronic kidney disease with heart failure and stage 1 through stage 4 chronic kidney disease, or unspecified chronic kidney disease: Secondary | ICD-10-CM | POA: Diagnosis not present

## 2019-07-10 DIAGNOSIS — S51802A Unspecified open wound of left forearm, initial encounter: Secondary | ICD-10-CM | POA: Diagnosis not present

## 2019-07-10 DIAGNOSIS — S51012A Laceration without foreign body of left elbow, initial encounter: Secondary | ICD-10-CM | POA: Diagnosis not present

## 2019-07-10 DIAGNOSIS — S81811D Laceration without foreign body, right lower leg, subsequent encounter: Secondary | ICD-10-CM | POA: Diagnosis not present

## 2019-07-10 DIAGNOSIS — E440 Moderate protein-calorie malnutrition: Secondary | ICD-10-CM | POA: Diagnosis not present

## 2019-07-10 DIAGNOSIS — E559 Vitamin D deficiency, unspecified: Secondary | ICD-10-CM | POA: Diagnosis not present

## 2019-07-10 DIAGNOSIS — S41112A Laceration without foreign body of left upper arm, initial encounter: Secondary | ICD-10-CM | POA: Diagnosis not present

## 2019-07-10 DIAGNOSIS — Z86010 Personal history of colonic polyps: Secondary | ICD-10-CM | POA: Diagnosis not present

## 2019-07-10 DIAGNOSIS — S51812A Laceration without foreign body of left forearm, initial encounter: Secondary | ICD-10-CM | POA: Diagnosis not present

## 2019-07-10 DIAGNOSIS — S41112D Laceration without foreign body of left upper arm, subsequent encounter: Secondary | ICD-10-CM | POA: Diagnosis not present

## 2019-07-10 DIAGNOSIS — E039 Hypothyroidism, unspecified: Secondary | ICD-10-CM | POA: Diagnosis not present

## 2019-07-10 DIAGNOSIS — N183 Chronic kidney disease, stage 3 unspecified: Secondary | ICD-10-CM | POA: Diagnosis not present

## 2019-07-10 NOTE — Telephone Encounter (Signed)
Rec'd call back from [place] (son-in-law) and explained to him why I needed to reschedule the Palliative Consult and he relayed this information to the daughter that was present during our conversation. I asked if there were any urgent issues with the patient and the daughter said she seemed to be doing fine and daughter was in agreement with scheduling appointment after the NP returned.  Appointment was rescheduled for 08/02/19 @ 11 AM.

## 2019-07-10 NOTE — Telephone Encounter (Signed)
Called to reschedule the In-home Palliative Consult scheduled for 07/12/19 (due to NP had family emergency), no answer - left message with reason for call along with my contact information.

## 2019-07-10 NOTE — Progress Notes (Addendum)
TYNA, HUERTAS ([id number]) Visit Report for 07/10/2019 Chief Complaint Document Details Patient Name: [name], [name]. Date of Service: 07/10/2019 3:00 PM Medical Record Number: [id number] Patient Account Number: [id number] Date of Birth/Sex: 05-14-1920 (86 y.o. F) Treating RN: Montey Hora Primary Care Provider: Ria Bush Other Clinician: Referring Provider: Ria Bush Treating Provider/Extender: Melburn Hake, Lasha Echeverria Weeks in Treatment: 2 Information Obtained from: Patient Chief Complaint Skin tear on the left upper arm, left forearm, left posterior lower leg, and right lateral lower leg. Electronic Signature(s) Signed: 07/10/2019 3:18:28 PM By: Worthy Keeler PA-C Entered By: Worthy Keeler on 07/10/2019 15:18:27 Ferrone, [name] ([id number]) -------------------------------------------------------------------------------- HPI Details Patient Name: [name] Date of Service: 07/10/2019 3:00 PM Medical Record Number: [id number] Patient Account Number: [id number] Date of Birth/Sex: 05-Mar-1920 (86 y.o. F) Treating RN: Montey Hora Primary Care Provider: Ria Bush Other Clinician: Referring Provider: Ria Bush Treating Provider/Extender: Melburn Hake, Barrington Worley Weeks in Treatment: 2 History of Present Illness HPI Description: 12/15/2018 on evaluation today patient presents for initial evaluation here in our clinic concerning issues that she has been having at multiple locations with skin tears. She had a fall roughly 2 weeks ago where she injured her left upper arm and forearm at that time. Subsequently she ended up as well with a skin tear to her leg when her family members were trying to get her into the bed her skin is so fragile that as she was lifting her legs the skin on the back of the leg tore. That is on the right. Nonetheless they have been trying to manage these I did see her primary care provider earlier in the week who referred her to [name] for  further evaluation and treatment. Currently there does not appear to be any signs of active infection at this time which is good news at any site. No fevers, chills, nausea, vomiting, or diarrhea. She does have pain but this is mainly on the leg ulcer the arms are really not to painful at this point. The patient does have a history of dementia, hypertension, congestive heart failure, and chronic kidney disease stage III. 12/29/2018 on evaluation today patient appears to be doing better in regard to her skin tears on the forearm both are healing quite nicely one is close the other is slightly hyper granulated which is probably preventing complete epithelization. With regard to her right lower extremity unfortunately she is still having a lot of dressing material/alginate which is stuck to the wound bed. This seems to be a result of unfortunately having a different type of alginate than what we actually ordered for her. We had ordered silver cell as this was more nonstick unfortunately she received a different alginate which is breaking down and getting somewhat matted into the wound. In fact I would have to try to get some of this off today if at all possible. Other than that she seems to actually be healing nicely she has much less drainage than previously noted. 01/02/19 on evaluation today patient appears to be doing well with regard to her left forearm and right lower extremity ulcers. The left forearm appears to be completely healed which is great news the right leg is doing much better that there's still some eschar/dressing material stuck to the wound it appears to be healing underneath quite well. The Xeroform did much better for her. 01/09/2019 on evaluation today patient appears to be doing very well with regard to her right lower extremity ulcer. She has been tolerating the  dressing changes without complication. Unfortunately she still has a lot of the alginate dressing stuck to the wound bed  that somewhat mad again. It looks like there may be some healing underneath some of these areas but nonetheless I feel like we really need to try to remove that today. This was discussed with the patient's daughter as well. The good news is the patient is really not having a lot of pain. 01/16/2019 upon evaluation today patient appears to be doing well with regard to her wound. In fact this is measuring quite a bit smaller and overall I am very pleased with how things seem to be progressing. The patient seems to be doing well although she does have dementia her family member who is with her today is very pleased with how things seem to be progressing. 01/30/2019 on evaluation today patient appears to be doing excellent in regard to her ulcer. She has been tolerating the dressing changes without complication. Fortunately there is no signs of anything remaining open at this point which is excellent.  Readmission:06/26/2019 upon evaluation today patient presents for reevaluation here in the clinic concerning issues unfortunately that she has with new wounds to her left forearm, left upper arm, right lower extremity, and left lower extremity. She had 2 different falls which actually resulted in the skin tears as noted here. Fortunately there is no evidence of active infection. I previously have seen her for skin tears at that time as well. She responded extremely well with Xeroform at that point. Alginate did not do well at all for her in fact the alginate that the home health company used at that time got stuck into her wound on the leg and we had to have a fairly significant debridement to remove all this that definitely do not want to go that way again. I completely understand. Nonetheless I think she responded so well to the Xeroform at that time that would be my go to dressing of choice at this point as well in fact this point they have been using it already some of the wounds seem to be doing better.  Her left forearm is actually the worst of all the wounds. 07/02/2019 upon evaluation today patient appears to be doing better in regard to most of her original wounds that she had upon last evaluation. Unfortunately the left elbow is a new skin tear that appears to be at least a day old may be 2 but again the skin has not reattached to the point that I think we cannot try to stretch this out and see if we get it reattached. This is not a fresh wound does not right here in the office is not bleeding enough for that and I showed that to the patient's family member as well as he question whether or not getting her into the chair because the skin tear. Again I do not believe there is any way possible that is the case with the way its not actively bleeding at this time. Again I explained that even pulling the skin over there is no guarantee that we can be able to get this to completely reattached but we will give it our best. 07/10/2019 upon evaluation today patient actually appears to be doing much better and a lot of her wounds are actually improving quite significantly which is great news. There does not appear to be any evidence of active infection which is also great news. Overall I am extremely pleased with how she is doing.  She does have a new skin tear however which occurred sometime over the weekend according to her family member who is with her today. Nonetheless this is minimal working to keep an eye on this it appears to be pretty stable and we will see how it appears next week. Electronic Signature(s) Signed: 07/10/2019 5:16:45 PM By: Worthy Keeler PA-C Entered By: Worthy Keeler on 07/10/2019 17:16:45 Florea, [name] ([id number]) -------------------------------------------------------------------------------- Otelia Sergeant TISS Details Patient Name: [name] Date of Service: 07/10/2019 3:00 PM Medical Record Number: [id number] Patient Account Number: [id number] Date of  Birth/Sex: August 31, 1920 (86 y.o. F) Treating RN: Montey Hora Primary Care Provider: Ria Bush Other Clinician: Referring Provider: Ria Bush Treating Provider/Extender: Melburn Hake, Garnetta Fedrick Weeks in Treatment: 2 Procedure Performed for: Wound #5 Left Forearm Performed By: Physician STONE III, Toryn Dewalt E., PA-C Post Procedure Diagnosis Same as Pre-procedure Notes 1 stick of silver nitrate used Electronic Signature(s) Signed: 07/10/2019 4:03:08 PM By: Montey Hora Entered By: Montey Hora on 07/10/2019 16:03:07 Wedel, [name] ([id number]) -------------------------------------------------------------------------------- Physical Exam Details Patient Name: [name] Date of Service: 07/10/2019 3:00 PM Medical Record Number: [id number] Patient Account Number: [id number] Date of Birth/Sex: Dec 11, 1920 (86 y.o. F) Treating RN: Montey Hora Primary Care Provider: Ria Bush Other Clinician: Referring Provider: Ria Bush Treating Provider/Extender: STONE III, Estefanny Moler Weeks in Treatment: 2 Constitutional Thin and well-hydrated in no acute distress. Respiratory normal breathing without difficulty. Psychiatric Patient is not able to cooperate in decision making regarding care. Patient has dementia. pleasant and cooperative. Notes Upon inspection patient's wound bed actually showed signs of good granulation at this time there does not appear to be any evidence of active infection and overall I am very pleased with how things seem to be progressing. Her family member is happy she does have some hyper granular tissue over the left forearm I did perform chemical cauterization today at the site which she tolerated with no significant evidence of pain. Electronic Signature(s) Signed: 07/10/2019 5:17:09 PM By: Worthy Keeler PA-C Entered By: Worthy Keeler on 07/10/2019 17:17:09 Vanschaick, [name]  ([id number]) -------------------------------------------------------------------------------- Physician Orders Details Patient Name: [name] Date of Service: 07/10/2019 3:00 PM Medical Record Number: [id number] Patient Account Number: [id number] Date of Birth/Sex: 1920/12/27 (86 y.o. F) Treating RN: Montey Hora Primary Care Provider: Ria Bush Other Clinician: Referring Provider: Ria Bush Treating Provider/Extender: Melburn Hake, Nadiya Pieratt Weeks in Treatment: 2 Verbal / Phone Orders: No Diagnosis Coding ICD-10 Coding Code Description S41.102A Unspecified open wound of left upper arm, initial encounter S51.802A Unspecified open wound of left forearm, initial encounter S81.802A Unspecified open wound, left lower leg, initial encounter S81.801A Unspecified open wound, right lower leg, initial encounter F03.90 Unspecified dementia without behavioral disturbance I10 Essential (primary) hypertension O84.16 Chronic diastolic (congestive) heart failure N18.30 Chronic kidney disease, stage 3 unspecified Wound Cleansing Wound #5 Left Forearm o Clean wound with Normal Saline. o May Shower, gently pat wound dry prior to applying new dressing. Wound #8 Left Elbow o Clean wound with Normal Saline. o May Shower, gently pat wound dry prior to applying new dressing. Primary Wound Dressing Wound #5 Left Forearm o Xeroform Wound #8 Left Elbow o Xeroform Secondary Dressing Wound #5 Left Forearm o ABD and Kerlix/Conform - secure with netting Wound #8 Left Elbow o ABD and Kerlix/Conform - secure with netting Dressing Change Frequency Wound #5 Left Forearm o Change dressing every other day. Wound #8 Left Elbow o Change dressing every other day. Follow-up Appointments o Return Appointment  in 2 weeks. Edema Control Wound #5 Left Forearm o Elevate legs to the level of the heart and pump ankles as often as possible Wound #8 Left Elbow o Elevate  legs to the level of the heart and pump ankles as often as possible Daus, Nashay W. ([id number]) Frio #5 Left Forearm o [place] Visits - [facility] please call [facility] with a fax number for orders o [place] Nurse may visit PRN to address patientos wound care needs. o FACE TO FACE ENCOUNTER: MEDICARE and MEDICAID PATIENTS: I certify that this patient is under my care and that I had a face-to-face encounter that meets the physician face-to-face encounter requirements with this patient on this date. The encounter with the patient was in whole or in part for the following MEDICAL CONDITION: (primary reason for [place]) MEDICAL NECESSITY: I certify, that based on my findings, NURSING services are a medically necessary home health service. HOME BOUND STATUS: I certify that my clinical findings support that this patient is homebound (i.e., Due to illness or injury, pt requires aid of supportive devices such as crutches, cane, wheelchairs, walkers, the use of special transportation or the assistance of another person to leave their place of residence. There is a normal inability to leave the home and doing so requires considerable and taxing effort. Other absences are for medical reasons / religious services and are infrequent or of short duration when for other reasons). o If current dressing causes regression in wound condition, may D/C ordered dressing product/s and apply Normal Saline Moist Dressing daily until next [place] / Other MD appointment. [place] of regression in wound condition at [phone]. o Please direct any NON-WOUND related issues/requests for orders to patient's Primary Care Physician Wound #8 Left Elbow o [place] Visits - [facility] please call [facility] with a fax number for orders o [place] Nurse may visit PRN to address patientos wound care needs. o FACE TO FACE ENCOUNTER: MEDICARE  and MEDICAID PATIENTS: I certify that this patient is under my care and that I had a face-to-face encounter that meets the physician face-to-face encounter requirements with this patient on this date. The encounter with the patient was in whole or in part for the following MEDICAL CONDITION: (primary reason for [place]) MEDICAL NECESSITY: I certify, that based on my findings, NURSING services are a medically necessary home health service. HOME BOUND STATUS: I certify that my clinical findings support that this patient is homebound (i.e., Due to illness or injury, pt requires aid of supportive devices such as crutches, cane, wheelchairs, walkers, the use of special transportation or the assistance of another person to leave their place of residence. There is a normal inability to leave the home and doing so requires considerable and taxing effort. Other absences are for medical reasons / religious services and are infrequent or of short duration when for other reasons). o If current dressing causes regression in wound condition, may D/C ordered dressing product/s and apply Normal Saline Moist Dressing daily until next [place] / Other MD appointment. [place] of regression in wound condition at [phone]. o Please direct any NON-WOUND related issues/requests for orders to patient's Primary Care Physician Electronic Signature(s) Signed: 07/10/2019 4:29:48 PM By: Montey Hora Signed: 07/10/2019 6:10:04 PM By: Worthy Keeler PA-C Previous Signature: 07/10/2019 4:01:45 PM Version By: Montey Hora Entered By: Montey Hora on 07/10/2019 16:03:38 Amon, [name] ([id number]) -------------------------------------------------------------------------------- Problem List  Details Patient Name: JAELYN, BOURGOIN Date of Service: 07/10/2019 3:00 PM Medical Record Number: [id number] Patient Account Number: [id number] Date of Birth/Sex: [birth year] (86 y.o.  F) Treating RN: Montey Hora Primary Care Provider: Ria Bush Other Clinician: Referring Provider: Ria Bush Treating Provider/Extender: Melburn Hake, Laniah Grimm Weeks in Treatment: 2 Active Problems ICD-10 Encounter Code Description Active Date MDM Diagnosis S41.102A Unspecified open wound of left upper arm, initial encounter 06/26/2019 No Yes S51.802A Unspecified open wound of left forearm, initial encounter 06/26/2019 No Yes S81.802A Unspecified open wound, left lower leg, initial encounter 06/26/2019 No Yes S81.801A Unspecified open wound, right lower leg, initial encounter 06/26/2019 No Yes F03.90 Unspecified dementia without behavioral disturbance 06/26/2019 No Yes I10 Essential (primary) hypertension 06/26/2019 No Yes W96.75 Chronic diastolic (congestive) heart failure 06/26/2019 No Yes N18.30 Chronic kidney disease, stage 3 unspecified 06/26/2019 No Yes Inactive Problems Resolved Problems Electronic Signature(s) Signed: 07/10/2019 3:18:22 PM By: Worthy Keeler PA-C Entered By: Worthy Keeler on 07/10/2019 15:18:21 Kuklinski, [name] ([id number]) -------------------------------------------------------------------------------- Progress Note Details Patient Name: [name] Date of Service: 07/10/2019 3:00 PM Medical Record Number: [id number] Patient Account Number: [id number] Date of Birth/Sex: 03-19-1920 (86 y.o. F) Treating RN: Montey Hora Primary Care Provider: Ria Bush Other Clinician: Referring Provider: Ria Bush Treating Provider/Extender: Melburn Hake, Rickie Gutierres Weeks in Treatment: 2 Subjective Chief Complaint Information obtained from Patient Skin tear on the left upper arm, left forearm, left posterior lower leg, and right lateral lower leg. History of Present Illness (HPI) 12/15/2018 on evaluation today patient presents for initial evaluation here in our clinic concerning issues that she has been having at multiple locations with skin tears. She had  a fall roughly 2 weeks ago where she injured her left upper arm and forearm at that time. Subsequently she ended up as well with a skin tear to her leg when her family members were trying to get her into the bed her skin is so fragile that as she was lifting her legs the skin on the back of the leg tore. That is on the right. Nonetheless they have been trying to manage these I did see her primary care provider earlier in the week who referred her to [name] for further evaluation and treatment. Currently there does not appear to be any signs of active infection at this time which is good news at any site. No fevers, chills, nausea, vomiting, or diarrhea. She does have pain but this is mainly on the leg ulcer the arms are really not to painful at this point. The patient does have a history of dementia, hypertension, congestive heart failure, and chronic kidney disease stage III. 12/29/2018 on evaluation today patient appears to be doing better in regard to her skin tears on the forearm both are healing quite nicely one is close the other is slightly hyper granulated which is probably preventing complete epithelization. With regard to her right lower extremity unfortunately she is still having a lot of dressing material/alginate which is stuck to the wound bed. This seems to be a result of unfortunately having a different type of alginate than what we actually ordered for her. We had ordered silver cell as this was more nonstick unfortunately she received a different alginate which is breaking down and getting somewhat matted into the wound. In fact I would have to try to get some of this off today if at all possible. Other than that she seems to actually be healing nicely she has much less drainage than previously noted.  01/02/19 on evaluation today patient appears to be doing well with regard to her left forearm and right lower extremity ulcers. The left forearm appears to be completely healed which is great  news the right leg is doing much better that there's still some eschar/dressing material stuck to the wound it appears to be healing underneath quite well. The Xeroform did much better for her. 01/09/2019 on evaluation today patient appears to be doing very well with regard to her right lower extremity ulcer. She has been tolerating the dressing changes without complication. Unfortunately she still has a lot of the alginate dressing stuck to the wound bed that somewhat mad again. It looks like there may be some healing underneath some of these areas but nonetheless I feel like we really need to try to remove that today. This was discussed with the patient's daughter as well. The good news is the patient is really not having a lot of pain. 01/16/2019 upon evaluation today patient appears to be doing well with regard to her wound. In fact this is measuring quite a bit smaller and overall I am very pleased with how things seem to be progressing. The patient seems to be doing well although she does have dementia her family member who is with her today is very pleased with how things seem to be progressing. 01/30/2019 on evaluation today patient appears to be doing excellent in regard to her ulcer. She has been tolerating the dressing changes without complication. Fortunately there is no signs of anything remaining open at this point which is excellent.  Readmission:06/26/2019 upon evaluation today patient presents for reevaluation here in the clinic concerning issues unfortunately that she has with new wounds to her left forearm, left upper arm, right lower extremity, and left lower extremity. She had 2 different falls which actually resulted in the skin tears as noted here. Fortunately there is no evidence of active infection. I previously have seen her for skin tears at that time as well. She responded extremely well with Xeroform at that point. Alginate did not do well at all for her in fact the alginate  that the home health company used at that time got stuck into her wound on the leg and we had to have a fairly significant debridement to remove all this that definitely do not want to go that way again. I completely understand. Nonetheless I think she responded so well to the Xeroform at that time that would be my go to dressing of choice at this point as well in fact this point they have been using it already some of the wounds seem to be doing better. Her left forearm is actually the worst of all the wounds. 07/02/2019 upon evaluation today patient appears to be doing better in regard to most of her original wounds that she had upon last evaluation. Unfortunately the left elbow is a new skin tear that appears to be at least a day old may be 2 but again the skin has not reattached to the point that I think we cannot try to stretch this out and see if we get it reattached. This is not a fresh wound does not right here in the office is not bleeding enough for that and I showed that to the patient's family member as well as he question whether or not getting her into the chair because the skin tear. Again I do not believe there is any way possible that is the case with the way its not  actively bleeding at this time. Again I explained that even pulling the skin over there is no guarantee that we can be able to get this to completely reattached but we will give it our best. 07/10/2019 upon evaluation today patient actually appears to be doing much better and a lot of her wounds are actually improving quite significantly which is great news. There does not appear to be any evidence of active infection which is also great news. Overall I am extremely pleased with how she is doing. She does have a new skin tear however which occurred sometime over the weekend according to her family member who is with her today. Nonetheless this is minimal working to keep an eye on this it appears to be pretty stable and we  will see how it appears next week. CHENNEL, OLIVOS ([id number]) Objective Constitutional Thin and well-hydrated in no acute distress. Vitals Time Taken: 3:15 PM, Height: 62 in, Weight: 113 lbs, BMI: 20.7, Temperature: 98.3 F, Pulse: 85 bpm, Respiratory Rate: 16 breaths/min, Blood Pressure: 157/68 mmHg. Respiratory normal breathing without difficulty. Psychiatric Patient is not able to cooperate in decision making regarding care. Patient has dementia. pleasant and cooperative. General Notes: Upon inspection patient's wound bed actually showed signs of good granulation at this time there does not appear to be any evidence of active infection and overall I am very pleased with how things seem to be progressing. Her family member is happy she does have some hyper granular tissue over the left forearm I did perform chemical cauterization today at the site which she tolerated with no significant evidence of pain. Integumentary (Hair, Skin) Wound #4 status is Open. Original cause of wound was Trauma. The wound is located on the Left Upper Arm. The wound measures 0cm length x 0cm width x 0cm depth; 0cm^2 area and 0cm^3 volume. Wound #5 status is Open. Original cause of wound was Trauma. The wound is located on the Left Forearm. The wound measures 1.3cm length x 4cm width x 0.1cm depth; 4.084cm^2 area and 0.408cm^3 volume. Wound #6 status is Healed - Epithelialized. Original cause of wound was Trauma. The wound is located on the Left,Posterior Lower Leg. The wound measures 0cm length x 0cm width x 0cm depth; 0cm^2 area and 0cm^3 volume. Wound #7 status is Healed - Epithelialized. Original cause of wound was Trauma. The wound is located on the Right,Lateral Lower Leg. The wound measures 0cm length x 0cm width x 0cm depth; 0cm^2 area and 0cm^3 volume. Wound #8 status is Open. Original cause of wound was Trauma. The wound is located on the Left Elbow. The wound measures 0.3cm length x 0.3cm width x  0.1cm depth; 0.071cm^2 area and 0.007cm^3 volume. Assessment Active Problems ICD-10 Unspecified open wound of left upper arm, initial encounter Unspecified open wound of left forearm, initial encounter Unspecified open wound, left lower leg, initial encounter Unspecified open wound, right lower leg, initial encounter Unspecified dementia without behavioral disturbance Essential (primary) hypertension Chronic diastolic (congestive) heart failure Chronic kidney disease, stage 3 unspecified Procedures Wound #5 Pre-procedure diagnosis of Wound #5 is a Trauma, Other located on the Left Forearm . An CHEM CAUT GRANULATION TISS procedure was performed by STONE III, Temekia Caskey E., PA-C. Post procedure Diagnosis Wound #5: Same as Pre-Procedure Notes: 1 stick of silver nitrate used Keshishyan, Taydem W. ([id number]) Plan Wound Cleansing: Wound #5 Left Forearm: Clean wound with Normal Saline. May Shower, gently pat wound dry prior to applying new dressing. Wound #8 Left Elbow: Clean wound with  Normal Saline. May Shower, gently pat wound dry prior to applying new dressing. Primary Wound Dressing: Wound #5 Left Forearm: Xeroform Wound #8 Left Elbow: Xeroform Secondary Dressing: Wound #5 Left Forearm: ABD and Kerlix/Conform - secure with netting Wound #8 Left Elbow: ABD and Kerlix/Conform - secure with netting Dressing Change Frequency: Wound #5 Left Forearm: Change dressing every other day. Wound #8 Left Elbow: Change dressing every other day. Follow-up Appointments: Return Appointment in 2 weeks. Edema Control: Wound #5 Left Forearm: Elevate legs to the level of the heart and pump ankles as often as possible Wound #8 Left Elbow: Elevate legs to the level of the heart and pump ankles as often as possible Home Health: Wound #5 Left Forearm: Continue Home Health Visits - [place] please call [facility] with a fax number for orders Home Health Nurse may visit PRN to address patient s wound care  needs. FACE TO FACE ENCOUNTER: MEDICARE and MEDICAID PATIENTS: I certify that this patient is under my care and that I had a face-to-face encounter that meets the physician face-to-face encounter requirements with this patient on this date. The encounter with the patient was in whole or in part for the following MEDICAL CONDITION: (primary reason for [place]) MEDICAL NECESSITY: I certify, that based on my findings, NURSING services are a medically necessary home health service. HOME BOUND STATUS: I certify that my clinical findings support that this patient is homebound (i.e., Due to illness or injury, pt requires aid of supportive devices such as crutches, cane, wheelchairs, walkers, the use of special transportation or the assistance of another person to leave their place of residence. There is a normal inability to leave the home and doing so requires considerable and taxing effort. Other absences are for medical reasons / religious services and are infrequent or of short duration when for other reasons). If current dressing causes regression in wound condition, may D/C ordered dressing product/s and apply Normal Saline Moist Dressing daily until next [place] / Other MD appointment. [place] of regression in wound condition at [phone]. Please direct any NON-WOUND related issues/requests for orders to patient's Primary Care Physician Wound #8 Left Elbow: [place] Visits - [facility] please call [facility] with a fax number for orders Home Health Nurse may visit PRN to address patient s wound care needs. FACE TO FACE ENCOUNTER: MEDICARE and MEDICAID PATIENTS: I certify that this patient is under my care and that I had a face-to-face encounter that meets the physician face-to-face encounter requirements with this patient on this date. The encounter with the patient was in whole or in part for the following MEDICAL CONDITION: (primary reason for [place]) MEDICAL NECESSITY: I certify, that based on my findings, NURSING services are a medically necessary home health service. HOME BOUND STATUS: I certify that my clinical findings support that this patient is homebound (i.e., Due to illness or injury, pt requires aid of supportive devices such as crutches, cane, wheelchairs, walkers, the use of special transportation or the assistance of another person to leave their place of residence. There is a normal inability to leave the home and doing so requires considerable and taxing effort. Other absences are for medical reasons / religious services and are infrequent or of short duration when for other reasons). If current dressing causes regression in wound condition, may D/C ordered dressing product/s and apply Normal Saline Moist Dressing daily until next [place] / Other MD appointment. [place]  of regression in wound condition at [phone]. Please direct any NON-WOUND related issues/requests for orders to patient's Primary Care Physician 1. I would recommend currently that we going to continue with the wound care measures as before specifically with regard to the Xeroform gauze dressing which I think has done a good job for her. 2. I am also can recommend that we continue to cover this with roll gauze and secure with stretch netting I would not recommend any tape on her skin. 3. Her family does need to continue to monitor for any signs of infection and at the same time watch out for new bumps and hits as she seems to be quite prone to injury. With that being said there could be more conscientious of this going forward as well. We will see patient back for reevaluation in 2 weeks here in the clinic. If anything worsens or changes patient will contact our office for additional recommendations. IVETTE, CASTRONOVA ([id number]) Electronic Signature(s) Signed: 07/10/2019 5:18:03 PM By: Worthy Keeler PA-C Entered  By: Worthy Keeler on 07/10/2019 17:18:03 [place], [name] ([id number]) -------------------------------------------------------------------------------- SuperBill Details Patient Name: [name] Date of Service: 07/10/2019 Medical Record Number: [id number] Patient Account Number: [id number] Date of Birth/Sex: [birth year] (86 y.o. F) Treating RN: Montey Hora Primary Care Provider: Ria Bush Other Clinician: Referring Provider: Ria Bush Treating Provider/Extender: Melburn Hake, Sharaya Boruff Weeks in Treatment: 2 Diagnosis Coding ICD-10 Codes Code Description S41.102A Unspecified open wound of left upper arm, initial encounter S51.802A Unspecified open wound of left forearm, initial encounter S81.802A Unspecified open wound, left lower leg, initial encounter S81.801A Unspecified open wound, right lower leg, initial encounter F03.90 Unspecified dementia without behavioral disturbance I10 Essential (primary) hypertension N79.72 Chronic diastolic (congestive) heart failure N18.30 Chronic kidney disease, stage 3 unspecified Facility Procedures CPT4 Code: 82060156 Description: 15379 - CHEM CAUT GRANULATION TISS Modifier: Quantity: 1 CPT4 Code: Description: ICD-10 Diagnosis Description S51.802A Unspecified open wound of left forearm, initial encounter Modifier: Quantity: Physician Procedures CPT4 Code: 4327614 Description: 70929 - WC PHYS CHEM CAUT GRAN TISSUE Modifier: Quantity: 1 CPT4 Code: Description: ICD-10 Diagnosis Description S51.802A Unspecified open wound of left forearm, initial encounter Modifier: Quantity: Electronic Signature(s) Signed: 07/10/2019 5:18:17 PM By: Worthy Keeler PA-C Entered By: Worthy Keeler on 07/10/2019 17:18:17

## 2019-07-11 NOTE — Progress Notes (Signed)
DRUE, CAMERA ([id number]) Visit Report for 07/10/2019 Arrival Information Details Patient Name: [name] Date of Service: 07/10/2019 3:00 PM Medical Record Number: [id number] Patient Account Number: [id number] Date of Birth/Sex: [birth year] (86 y.o. F) Treating RN: Montey Hora Primary Care Hagan Vanauken: Ria Bush Other Clinician: Referring Kimya Mccahill: Ria Bush Treating Gray Maugeri/Extender: Melburn Hake, HOYT Weeks in Treatment: 2 Visit Information History Since Last Visit Added or deleted any medications: No Patient Arrived: Wheel Chair Any new allergies or adverse reactions: No Arrival Time: 15:17 Had a fall or experienced change in No Accompanied By: son activities of daily living that may affect Transfer Assistance: Manual risk of falls: Patient Identification Verified: Yes Signs or symptoms of abuse/neglect since last visito No Secondary Verification Process Completed: Yes Hospitalized since last visit: No Implantable device outside of the clinic excluding No cellular tissue based products placed in the center since last visit: Has Dressing in Place as Prescribed: Yes Pain Present Now: No Electronic Signature(s) Signed: 07/10/2019 4:17:10 PM By: Lorine Bears RCP, RRT, CHT Entered By: Lorine Bears on 07/10/2019 15:18:22 [name] ([id number]) -------------------------------------------------------------------------------- Encounter Discharge Information Details Patient Name: [name] Date of Service: 07/10/2019 3:00 PM Medical Record Number: [id number] Patient Account Number: [id number] Date of Birth/Sex: 05/17/1920 (86 y.o. F) Treating RN: Montey Hora Primary Care Mattheus Rauls: Ria Bush Other Clinician: Referring Murry Diaz: Ria Bush Treating Mickie Kozikowski/Extender: Melburn Hake, HOYT Weeks in Treatment: 2 Encounter Discharge Information Items Discharge Condition: Stable Ambulatory Status:  Wheelchair Discharge Destination: Home Transportation: Private Auto Accompanied By: daughter Schedule Follow-up Appointment: Yes Clinical Summary of Care: Electronic Signature(s) Signed: 07/10/2019 4:04:52 PM By: Montey Hora Entered By: Montey Hora on 07/10/2019 16:04:51 [name] ([id number]) -------------------------------------------------------------------------------- Lower Extremity Assessment Details Patient Name: [name] Date of Service: 07/10/2019 3:00 PM Medical Record Number: [id number] Patient Account Number: [id number] Date of Birth/Sex: 1921-01-11 (86 y.o. F) Treating RN: Cornell Barman Primary Care Jester Klingberg: Ria Bush Other Clinician: Referring Genieve Ramaswamy: Ria Bush Treating Jamila Slatten/Extender: Melburn Hake, HOYT Weeks in Treatment: 2 Edema Assessment Assessed: [Left: No] [Right: No] [Left: Edema] [Right: :] Calf Left: Right: Point of Measurement: 32 cm From Medial Instep 32 cm cm Ankle Left: Right: Point of Measurement: 10 cm From Medial Instep 20.5 cm cm Vascular Assessment Pulses: Dorsalis Pedis Palpable: [Left:Yes] Electronic Signature(s) Signed: 07/11/2019 5:04:05 PM By: Gretta Cool, BSN, RN, CWS, Kim RN, BSN Entered By: Gretta Cool, BSN, RN, CWS, Kim on 07/10/2019 15:32:22 [name] ([id number]) -------------------------------------------------------------------------------- Multi Wound Chart Details Patient Name: [name] Date of Service: 07/10/2019 3:00 PM Medical Record Number: [id number] Patient Account Number: [id number] Date of Birth/Sex: 1920-12-30 (86 y.o. F) Treating RN: Montey Hora Primary Care Tomma Ehinger: Ria Bush Other Clinician: Referring Jadelynn Boylan: Ria Bush Treating Aeson Sawyers/Extender: STONE III, HOYT Weeks in Treatment: 2 Vital Signs Height(in): 62 Pulse(bpm): 85 Weight(lbs): 113 Blood Pressure(mmHg): 157/68 Body Mass Index(BMI): 21 Temperature(F): 98.3 Respiratory  Rate(breaths/min): 16 Photos: Wound Location: Left Upper Arm Left Forearm Left, Posterior Lower Leg Wounding Event: Trauma Trauma Trauma Primary Etiology: Skin Tear Trauma, Other Trauma, Other Date Acquired: 06/12/2019 06/12/2019 06/12/2019 Weeks of Treatment: 2 2 2  Wound Status: Open Open Healed - Epithelialized Measurements L x W x D (cm) 0x0x0 1.3x4x0.1 0x0x0 Area (cm) : 0 4.084 0 Volume (cm) : 0 0.408 0 % Reduction in Area: 100.00% 42.20% 100.00% % Reduction in Volume: 100.00% 42.30% 100.00% Classification: Full Thickness Without Exposed Full Thickness Without Exposed Full Thickness Without Exposed Support Structures Support Structures Support Structures Wound Number: 7 8 N/A  Photos: N/A Wound Location: Right, Lateral Lower Leg Left Elbow N/A Wounding Event: Trauma Trauma N/A Primary Etiology: Trauma, Other Trauma, Other N/A Date Acquired: 06/12/2019 07/01/2019 N/A Weeks of Treatment: 2 1 N/A Wound Status: Healed - Epithelialized Open N/A Measurements L x W x D (cm) 0x0x0 0.3x0.3x0.1 N/A Area (cm) : 0 0.071 N/A Volume (cm) : 0 0.007 N/A % Reduction in Area: 100.00% 94.40% N/A % Reduction in Volume: 100.00% 94.40% N/A Classification: Full Thickness Without Exposed Full Thickness Without Exposed N/A Support Structures Support Structures Treatment Notes [name] ([id number]) Electronic Signature(s) Signed: 07/10/2019 4:01:10 PM By: Montey Hora Entered By: Montey Hora on 07/10/2019 16:01:10 [name] ([id number]) -------------------------------------------------------------------------------- Multi-Disciplinary Care Plan Details Patient Name: [name] Date of Service: 07/10/2019 3:00 PM Medical Record Number: [id number] Patient Account Number: [id number] Date of Birth/Sex: 1920-02-25 (86 y.o. F) Treating RN: Cornell Barman Primary Care Kyan Giannone: Ria Bush Other Clinician: Referring Markesha Hannig: Ria Bush Treating Carlie Corpus/Extender: Melburn Hake, HOYT Weeks in Treatment: 2 Active Inactive Abuse / Safety / Falls / Self Care Management Nursing Diagnoses: Potential for falls Goals: Patient will remain injury free related to falls Date Initiated: 06/26/2019 Target Resolution Date: 09/29/2019 Goal Status: Active Interventions: Assess fall risk on admission and as needed Notes: Orientation to the Wound Care Program Nursing Diagnoses: Knowledge deficit related to the wound healing center program Goals: Patient/caregiver will verbalize understanding of the [place] Program Date Initiated: 06/26/2019 Target Resolution Date: 09/29/2019 Goal Status: Active Interventions: Provide education on orientation to the wound center Notes: Wound/Skin Impairment Nursing Diagnoses: Impaired tissue integrity Goals: Ulcer/skin breakdown will heal within 14 weeks Date Initiated: 06/26/2019 Target Resolution Date: 09/29/2019 Goal Status: Active Interventions: Assess patient/caregiver ability to obtain necessary supplies Assess patient/caregiver ability to perform ulcer/skin care regimen upon admission and as needed Assess ulceration(s) every visit Notes: Electronic Signature(s) Signed: 07/11/2019 5:04:05 PM By: Gretta Cool, BSN, RN, CWS, Kim RN, BSN Entered By: Gretta Cool, BSN, RN, CWS, Kim on 07/10/2019 15:32:42 Manuelito, Waynard Cook ([id number]) -------------------------------------------------------------------------------- Pain Assessment Details Patient Name: [name] Date of Service: 07/10/2019 3:00 PM Medical Record Number: [id number] Patient Account Number: [id number] Date of Birth/Sex: Dec 19, 1920 (86 y.o. F) Treating RN: Cornell Barman Primary Care Karsyn Rochin: Ria Bush Other Clinician: Referring Jamecia Lerman: Ria Bush Treating Annamaria Salah/Extender: Melburn Hake, HOYT Weeks in Treatment: 2 Active Problems Location of Pain Severity and Description of Pain Patient Has Paino No Site Locations Pain Management and  Medication Current Pain Management: Notes Patient denies pain at this time. Electronic Signature(s) Signed: 07/11/2019 5:04:05 PM By: Gretta Cool, BSN, RN, CWS, Kim RN, BSN Entered By: Gretta Cool, BSN, RN, CWS, Kim on 07/10/2019 15:21:27 [name] ([id number]) -------------------------------------------------------------------------------- Patient/Caregiver Education Details Patient Name: [name] Date of Service: 07/10/2019 3:00 PM Medical Record Number: [id number] Patient Account Number: [id number] Date of Birth/Gender: 1920/06/24 (86 y.o. F) Treating RN: Montey Hora Primary Care Physician: Ria Bush Other Clinician: Referring Physician: Ria Bush Treating Physician/Extender: Sharalyn Ink in Treatment: 2 Education Assessment Education Provided To: Patient and Caregiver Education Topics Provided Wound/Skin Impairment: Handouts: Other: wound care as ordered Methods: Demonstration, Explain/Verbal Responses: State content correctly Electronic Signature(s) Signed: 07/10/2019 4:29:48 PM By: Montey Hora Entered By: Montey Hora on 07/10/2019 16:04:13 Bristow, Waynard Cook ([id number]) -------------------------------------------------------------------------------- Wound Assessment Details Patient Name: [name] Date of Service: 07/10/2019 3:00 PM Medical Record Number: [id number] Patient Account Number: [id number] Date of Birth/Sex: 01-Mar-1920 (86 y.o. F) Treating RN: Cornell Barman Primary Care Kevonna Nolte: Ria Bush Other Clinician: Referring  Afiya Ferrebee: Ria Bush Treating Lum Stillinger/Extender: STONE III, HOYT Weeks in Treatment: 2 Wound Status Wound Number: 4 Primary Etiology: Skin Tear Wound Location: Left Upper Arm Wound Status: Open Wounding Event: Trauma Date Acquired: 06/12/2019 Weeks Of Treatment: 2 Clustered Wound: No Photos Photo Uploaded By: Gretta Cool, BSN, RN, CWS, Kim on 07/10/2019 15:33:50 Wound Measurements Length:  (cm) Width: (cm) Depth: (cm) Area: (cm) Volume: (cm) 0 % Reduction in Area: 100% 0 % Reduction in Volume: 100% 0 0 0 Wound Description Classification: Full Thickness Without Exposed Support Structu res Engineer, maintenance) Signed: 07/11/2019 5:04:05 PM By: Gretta Cool, BSN, RN, CWS, Kim RN, BSN Entered By: Gretta Cool, BSN, RN, CWS, Kim on 07/10/2019 15:27:09 Barreira, Waynard Cook ([id number]) -------------------------------------------------------------------------------- Wound Assessment Details Patient Name: [name] Date of Service: 07/10/2019 3:00 PM Medical Record Number: [id number] Patient Account Number: [id number] Date of Birth/Sex: 03-27-1920 (86 y.o. F) Treating RN: Cornell Barman Primary Care Donise Woodle: Ria Bush Other Clinician: Referring Tearia Gibbs: Ria Bush Treating Amarissa Koerner/Extender: Melburn Hake, HOYT Weeks in Treatment: 2 Wound Status Wound Number: 5 Primary Etiology: Trauma, Other Wound Location: Left Forearm Wound Status: Open Wounding Event: Trauma Date Acquired: 06/12/2019 Weeks Of Treatment: 2 Clustered Wound: No Photos Photo Uploaded By: Gretta Cool, BSN, RN, CWS, Kim on 07/10/2019 15:33:51 Wound Measurements Length: (cm) 1.3 Width: (cm) 4 Depth: (cm) 0.1 Area: (cm) 4.084 Volume: (cm) 0.408 % Reduction in Area: 42.2% % Reduction in Volume: 42.3% Wound Description Classification: Full Thickness Without Exposed Support Structu res Treatment Notes Wound #5 (Left Forearm) Notes xeroform, ABD, conform Electronic Signature(s) Signed: 07/11/2019 5:04:05 PM By: Gretta Cool, BSN, RN, CWS, Kim RN, BSN Entered By: Gretta Cool, BSN, RN, CWS, Kim on 07/10/2019 15:27:10 Vonstein, Waynard Cook ([id number]) -------------------------------------------------------------------------------- Wound Assessment Details Patient Name: [name] Date of Service: 07/10/2019 3:00 PM Medical Record Number: [id number] Patient Account Number: [id number] Date of Birth/Sex:  December 01, 1920 (86 y.o. F) Treating RN: Cornell Barman Primary Care Traves Majchrzak: Ria Bush Other Clinician: Referring Zalaya Astarita: Ria Bush Treating Akyah Lagrange/Extender: STONE III, HOYT Weeks in Treatment: 2 Wound Status Wound Number: 6 Primary Etiology: Trauma, Other Wound Location: Left, Posterior Lower Leg Wound Status: Healed - Epithelialized Wounding Event: Trauma Date Acquired: 06/12/2019 Weeks Of Treatment: 2 Clustered Wound: No Photos Photo Uploaded By: Gretta Cool, BSN, RN, CWS, Kim on 07/10/2019 15:34:31 Wound Measurements Length: (cm) Width: (cm) Depth: (cm) Area: (cm) Volume: (cm) 0 % Reduction in Area: 100% 0 % Reduction in Volume: 100% 0 0 0 Wound Description Classification: Full Thickness Without Exposed Support Structu res Engineer, maintenance) Signed: 07/11/2019 5:04:05 PM By: Gretta Cool, BSN, RN, CWS, Kim RN, BSN Entered By: Gretta Cool, BSN, RN, CWS, Kim on 07/10/2019 15:27:36 Lebow, Waynard Cook ([id number]) -------------------------------------------------------------------------------- Wound Assessment Details Patient Name: [name] Date of Service: 07/10/2019 3:00 PM Medical Record Number: [id number] Patient Account Number: [id number] Date of Birth/Sex: 08/14/1920 (86 y.o. F) Treating RN: Cornell Barman Primary Care Deandrae Wajda: Ria Bush Other Clinician: Referring Mariusz Jubb: Ria Bush Treating Morell Mears/Extender: STONE III, HOYT Weeks in Treatment: 2 Wound Status Wound Number: 7 Primary Etiology: Trauma, Other Wound Location: Right, Lateral Lower Leg Wound Status: Healed - Epithelialized Wounding Event: Trauma Date Acquired: 06/12/2019 Weeks Of Treatment: 2 Clustered Wound: No Photos Photo Uploaded By: Gretta Cool, BSN, RN, CWS, Kim on 07/10/2019 15:34:32 Wound Measurements Length: (cm) Width: (cm) Depth: (cm) Area: (cm) Volume: (cm) 0 % Reduction in Area: 100% 0 % Reduction in Volume: 100% 0 0 0 Wound Description Classification:  Full Thickness Without Exposed Support Structu res Electronic Signature(s) Signed: 07/11/2019 5:04:05 PM By: Gretta Cool,  BSN, RN, CWS, Kim RN, BSN Entered By: Gretta Cool, BSN, RN, CWS, Kim on 07/10/2019 15:21:49 [name] ([id number]) -------------------------------------------------------------------------------- Wound Assessment Details Patient Name: [name] Date of Service: 07/10/2019 3:00 PM Medical Record Number: [id number] Patient Account Number: [id number] Date of Birth/Sex: [birth year] (86 y.o. F) Treating RN: Cornell Barman Primary Care Regan Mcbryar: Ria Bush Other Clinician: Referring Sharis Keeran: Ria Bush Treating Heli Dino/Extender: Melburn Hake, HOYT Weeks in Treatment: 2 Wound Status Wound Number: 8 Primary Etiology: Trauma, Other Wound Location: Left Elbow Wound Status: Open Wounding Event: Trauma Date Acquired: 07/01/2019 Weeks Of Treatment: 1 Clustered Wound: No Photos Photo Uploaded By: Gretta Cool, BSN, RN, CWS, Kim on 07/10/2019 15:34:47 Wound Measurements Length: (cm) 0.3 Width: (cm) 0.3 Depth: (cm) 0.1 Area: (cm) 0.071 Volume: (cm) 0.007 % Reduction in Area: 94.4% % Reduction in Volume: 94.4% Wound Description Classification: Full Thickness Without Exposed Support Structu res Treatment Notes Wound #8 (Left Elbow) Notes xeroform, ABD, conform Electronic Signature(s) Signed: 07/11/2019 5:04:05 PM By: Gretta Cool, BSN, RN, CWS, Kim RN, BSN Entered By: Gretta Cool, BSN, RN, CWS, Kim on 07/10/2019 15:29:07 Vidana, Waynard Cook ([id number]) -------------------------------------------------------------------------------- Vitals Details Patient Name: [name] Date of Service: 07/10/2019 3:00 PM Medical Record Number: [id number] Patient Account Number: [id number] Date of Birth/Sex: July 08, 1920 (86 y.o. F) Treating RN: Montey Hora Primary Care Lio Wehrly: Ria Bush Other Clinician: Referring Rubby Barbary: Ria Bush Treating Keiaira Donlan/Extender:  STONE III, HOYT Weeks in Treatment: 2 Vital Signs Time Taken: 15:15 Temperature (F): 98.3 Height (in): 62 Pulse (bpm): 85 Weight (lbs): 113 Respiratory Rate (breaths/min): 16 Body Mass Index (BMI): 20.7 Blood Pressure (mmHg): 157/68 Reference Range: 80 - 120 mg / dl Electronic Signature(s) Signed: 07/10/2019 4:17:10 PM By: Lorine Bears RCP, RRT, CHT Entered By: Lorine Bears on 07/10/2019 15:20:01

## 2019-07-12 ENCOUNTER — Other Ambulatory Visit: Payer: Self-pay | Admitting: Hospice

## 2019-07-12 DIAGNOSIS — Z9181 History of falling: Secondary | ICD-10-CM | POA: Diagnosis not present

## 2019-07-12 DIAGNOSIS — E440 Moderate protein-calorie malnutrition: Secondary | ICD-10-CM | POA: Diagnosis not present

## 2019-07-12 DIAGNOSIS — E559 Vitamin D deficiency, unspecified: Secondary | ICD-10-CM | POA: Diagnosis not present

## 2019-07-12 DIAGNOSIS — S81811D Laceration without foreign body, right lower leg, subsequent encounter: Secondary | ICD-10-CM | POA: Diagnosis not present

## 2019-07-12 DIAGNOSIS — Z86010 Personal history of colonic polyps: Secondary | ICD-10-CM | POA: Diagnosis not present

## 2019-07-12 DIAGNOSIS — I130 Hypertensive heart and chronic kidney disease with heart failure and stage 1 through stage 4 chronic kidney disease, or unspecified chronic kidney disease: Secondary | ICD-10-CM | POA: Diagnosis not present

## 2019-07-12 DIAGNOSIS — N1832 Chronic kidney disease, stage 3b: Secondary | ICD-10-CM | POA: Diagnosis not present

## 2019-07-12 DIAGNOSIS — S41112D Laceration without foreign body of left upper arm, subsequent encounter: Secondary | ICD-10-CM | POA: Diagnosis not present

## 2019-07-12 DIAGNOSIS — S81812D Laceration without foreign body, left lower leg, subsequent encounter: Secondary | ICD-10-CM | POA: Diagnosis not present

## 2019-07-12 DIAGNOSIS — Z85038 Personal history of other malignant neoplasm of large intestine: Secondary | ICD-10-CM | POA: Diagnosis not present

## 2019-07-12 DIAGNOSIS — I5032 Chronic diastolic (congestive) heart failure: Secondary | ICD-10-CM | POA: Diagnosis not present

## 2019-07-12 DIAGNOSIS — E039 Hypothyroidism, unspecified: Secondary | ICD-10-CM | POA: Diagnosis not present

## 2019-07-17 DIAGNOSIS — E440 Moderate protein-calorie malnutrition: Secondary | ICD-10-CM | POA: Diagnosis not present

## 2019-07-17 DIAGNOSIS — I5032 Chronic diastolic (congestive) heart failure: Secondary | ICD-10-CM | POA: Diagnosis not present

## 2019-07-17 DIAGNOSIS — S81811D Laceration without foreign body, right lower leg, subsequent encounter: Secondary | ICD-10-CM | POA: Diagnosis not present

## 2019-07-17 DIAGNOSIS — I130 Hypertensive heart and chronic kidney disease with heart failure and stage 1 through stage 4 chronic kidney disease, or unspecified chronic kidney disease: Secondary | ICD-10-CM | POA: Diagnosis not present

## 2019-07-17 DIAGNOSIS — E039 Hypothyroidism, unspecified: Secondary | ICD-10-CM | POA: Diagnosis not present

## 2019-07-17 DIAGNOSIS — E559 Vitamin D deficiency, unspecified: Secondary | ICD-10-CM | POA: Diagnosis not present

## 2019-07-17 DIAGNOSIS — Z86010 Personal history of colonic polyps: Secondary | ICD-10-CM | POA: Diagnosis not present

## 2019-07-17 DIAGNOSIS — S41112D Laceration without foreign body of left upper arm, subsequent encounter: Secondary | ICD-10-CM | POA: Diagnosis not present

## 2019-07-17 DIAGNOSIS — Z9181 History of falling: Secondary | ICD-10-CM | POA: Diagnosis not present

## 2019-07-17 DIAGNOSIS — Z85038 Personal history of other malignant neoplasm of large intestine: Secondary | ICD-10-CM | POA: Diagnosis not present

## 2019-07-17 DIAGNOSIS — N1832 Chronic kidney disease, stage 3b: Secondary | ICD-10-CM | POA: Diagnosis not present

## 2019-07-17 DIAGNOSIS — S81812D Laceration without foreign body, left lower leg, subsequent encounter: Secondary | ICD-10-CM | POA: Diagnosis not present

## 2019-07-19 DIAGNOSIS — S81811D Laceration without foreign body, right lower leg, subsequent encounter: Secondary | ICD-10-CM | POA: Diagnosis not present

## 2019-07-19 DIAGNOSIS — I5032 Chronic diastolic (congestive) heart failure: Secondary | ICD-10-CM | POA: Diagnosis not present

## 2019-07-19 DIAGNOSIS — N1832 Chronic kidney disease, stage 3b: Secondary | ICD-10-CM | POA: Diagnosis not present

## 2019-07-19 DIAGNOSIS — Z85038 Personal history of other malignant neoplasm of large intestine: Secondary | ICD-10-CM | POA: Diagnosis not present

## 2019-07-19 DIAGNOSIS — S81812D Laceration without foreign body, left lower leg, subsequent encounter: Secondary | ICD-10-CM | POA: Diagnosis not present

## 2019-07-19 DIAGNOSIS — I130 Hypertensive heart and chronic kidney disease with heart failure and stage 1 through stage 4 chronic kidney disease, or unspecified chronic kidney disease: Secondary | ICD-10-CM | POA: Diagnosis not present

## 2019-07-19 DIAGNOSIS — E440 Moderate protein-calorie malnutrition: Secondary | ICD-10-CM | POA: Diagnosis not present

## 2019-07-19 DIAGNOSIS — Z86010 Personal history of colonic polyps: Secondary | ICD-10-CM | POA: Diagnosis not present

## 2019-07-19 DIAGNOSIS — Z9181 History of falling: Secondary | ICD-10-CM | POA: Diagnosis not present

## 2019-07-19 DIAGNOSIS — E559 Vitamin D deficiency, unspecified: Secondary | ICD-10-CM | POA: Diagnosis not present

## 2019-07-19 DIAGNOSIS — E039 Hypothyroidism, unspecified: Secondary | ICD-10-CM | POA: Diagnosis not present

## 2019-07-19 DIAGNOSIS — S41112D Laceration without foreign body of left upper arm, subsequent encounter: Secondary | ICD-10-CM | POA: Diagnosis not present

## 2019-07-20 DIAGNOSIS — Z86010 Personal history of colonic polyps: Secondary | ICD-10-CM | POA: Diagnosis not present

## 2019-07-20 DIAGNOSIS — S81811D Laceration without foreign body, right lower leg, subsequent encounter: Secondary | ICD-10-CM | POA: Diagnosis not present

## 2019-07-20 DIAGNOSIS — E039 Hypothyroidism, unspecified: Secondary | ICD-10-CM | POA: Diagnosis not present

## 2019-07-20 DIAGNOSIS — E559 Vitamin D deficiency, unspecified: Secondary | ICD-10-CM | POA: Diagnosis not present

## 2019-07-20 DIAGNOSIS — Z9181 History of falling: Secondary | ICD-10-CM | POA: Diagnosis not present

## 2019-07-20 DIAGNOSIS — S81812D Laceration without foreign body, left lower leg, subsequent encounter: Secondary | ICD-10-CM | POA: Diagnosis not present

## 2019-07-20 DIAGNOSIS — I130 Hypertensive heart and chronic kidney disease with heart failure and stage 1 through stage 4 chronic kidney disease, or unspecified chronic kidney disease: Secondary | ICD-10-CM | POA: Diagnosis not present

## 2019-07-20 DIAGNOSIS — N1832 Chronic kidney disease, stage 3b: Secondary | ICD-10-CM | POA: Diagnosis not present

## 2019-07-20 DIAGNOSIS — E440 Moderate protein-calorie malnutrition: Secondary | ICD-10-CM | POA: Diagnosis not present

## 2019-07-20 DIAGNOSIS — I5032 Chronic diastolic (congestive) heart failure: Secondary | ICD-10-CM | POA: Diagnosis not present

## 2019-07-20 DIAGNOSIS — Z85038 Personal history of other malignant neoplasm of large intestine: Secondary | ICD-10-CM | POA: Diagnosis not present

## 2019-07-20 DIAGNOSIS — S41112D Laceration without foreign body of left upper arm, subsequent encounter: Secondary | ICD-10-CM | POA: Diagnosis not present

## 2019-07-24 ENCOUNTER — Other Ambulatory Visit: Payer: Self-pay

## 2019-07-24 ENCOUNTER — Encounter: Payer: Medicare Other | Admitting: Physician Assistant

## 2019-07-24 DIAGNOSIS — S81812A Laceration without foreign body, left lower leg, initial encounter: Secondary | ICD-10-CM | POA: Diagnosis not present

## 2019-07-24 DIAGNOSIS — S81801A Unspecified open wound, right lower leg, initial encounter: Secondary | ICD-10-CM | POA: Diagnosis not present

## 2019-07-24 DIAGNOSIS — S81811A Laceration without foreign body, right lower leg, initial encounter: Secondary | ICD-10-CM | POA: Diagnosis not present

## 2019-07-24 DIAGNOSIS — S41102A Unspecified open wound of left upper arm, initial encounter: Secondary | ICD-10-CM | POA: Diagnosis not present

## 2019-07-24 DIAGNOSIS — I5032 Chronic diastolic (congestive) heart failure: Secondary | ICD-10-CM | POA: Diagnosis not present

## 2019-07-24 DIAGNOSIS — N183 Chronic kidney disease, stage 3 unspecified: Secondary | ICD-10-CM | POA: Diagnosis not present

## 2019-07-24 DIAGNOSIS — I130 Hypertensive heart and chronic kidney disease with heart failure and stage 1 through stage 4 chronic kidney disease, or unspecified chronic kidney disease: Secondary | ICD-10-CM | POA: Diagnosis not present

## 2019-07-24 DIAGNOSIS — S81802A Unspecified open wound, left lower leg, initial encounter: Secondary | ICD-10-CM | POA: Diagnosis not present

## 2019-07-24 DIAGNOSIS — S41112A Laceration without foreign body of left upper arm, initial encounter: Secondary | ICD-10-CM | POA: Diagnosis not present

## 2019-07-24 DIAGNOSIS — S80821A Blister (nonthermal), right lower leg, initial encounter: Secondary | ICD-10-CM | POA: Diagnosis not present

## 2019-07-24 DIAGNOSIS — S51012A Laceration without foreign body of left elbow, initial encounter: Secondary | ICD-10-CM | POA: Diagnosis not present

## 2019-07-24 DIAGNOSIS — S51802A Unspecified open wound of left forearm, initial encounter: Secondary | ICD-10-CM | POA: Diagnosis not present

## 2019-07-24 DIAGNOSIS — S51812A Laceration without foreign body of left forearm, initial encounter: Secondary | ICD-10-CM | POA: Diagnosis not present

## 2019-07-24 NOTE — Progress Notes (Addendum)
[name], [name] ([id number]) Visit Report for 07/24/2019 Chief Complaint Document Details Patient Name: [name], [name]. Date of Service: 07/24/2019 2:15 PM Medical Record Number: [id number] Patient Account Number: [id number] Date of Birth/Sex: Apr 16, 1920 (86 y.o. F) Treating RN: Cornell Barman Primary Care Provider: Ria Bush Other Clinician: Referring Provider: Ria Bush Treating Provider/Extender: Melburn Hake, Binyamin Nelis Weeks in Treatment: 4 Information Obtained from: Patient Chief Complaint Skin tear on the left upper arm, left forearm, left posterior lower leg, and right lateral lower leg. Electronic Signature(s) Signed: 07/24/2019 2:13:59 PM By: Worthy Keeler PA-C Entered By: Worthy Keeler on 07/24/2019 14:13:59 Hickok, [name] ([id number]) -------------------------------------------------------------------------------- HPI Details Patient Name: [name] Date of Service: 07/24/2019 2:15 PM Medical Record Number: [id number] Patient Account Number: [id number] Date of Birth/Sex: 1920-07-28 (86 y.o. F) Treating RN: Cornell Barman Primary Care Provider: Ria Bush Other Clinician: Referring Provider: Ria Bush Treating Provider/Extender: Melburn Hake, Yvonne Petite Weeks in Treatment: 4 History of Present Illness HPI Description: 12/15/2018 on evaluation today patient presents for initial evaluation here in our clinic concerning issues that she has been having at multiple locations with skin tears. She had a fall roughly 2 weeks ago where she injured her left upper arm and forearm at that time. Subsequently she ended up as well with a skin tear to her leg when her family members were trying to get her into the bed her skin is so fragile that as she was lifting her legs the skin on the back of the leg tore. That is on the right. Nonetheless they have been trying to manage these I did see her primary care provider earlier in the week who referred her to [name] for further  evaluation and treatment. Currently there does not appear to be any signs of active infection at this time which is good news at any site. No fevers, chills, nausea, vomiting, or diarrhea. She does have pain but this is mainly on the leg ulcer the arms are really not to painful at this point. The patient does have a history of dementia, hypertension, congestive heart failure, and chronic kidney disease stage III. 12/29/2018 on evaluation today patient appears to be doing better in regard to her skin tears on the forearm both are healing quite nicely one is close the other is slightly hyper granulated which is probably preventing complete epithelization. With regard to her right lower extremity unfortunately she is still having a lot of dressing material/alginate which is stuck to the wound bed. This seems to be a result of unfortunately having a different type of alginate than what we actually ordered for her. We had ordered silver cell as this was more nonstick unfortunately she received a different alginate which is breaking down and getting somewhat matted into the wound. In fact I would have to try to get some of this off today if at all possible. Other than that she seems to actually be healing nicely she has much less drainage than previously noted. 01/02/19 on evaluation today patient appears to be doing well with regard to her left forearm and right lower extremity ulcers. The left forearm appears to be completely healed which is great news the right leg is doing much better that there's still some eschar/dressing material stuck to the wound it appears to be healing underneath quite well. The Xeroform did much better for her. 01/09/2019 on evaluation today patient appears to be doing very well with regard to her right lower extremity ulcer. She has been tolerating the  dressing changes without complication. Unfortunately she still has a lot of the alginate dressing stuck to the wound bed that  somewhat mad again. It looks like there may be some healing underneath some of these areas but nonetheless I feel like we really need to try to remove that today. This was discussed with the patient's daughter as well. The good news is the patient is really not having a lot of pain. 01/16/2019 upon evaluation today patient appears to be doing well with regard to her wound. In fact this is measuring quite a bit smaller and overall I am very pleased with how things seem to be progressing. The patient seems to be doing well although she does have dementia her family member who is with her today is very pleased with how things seem to be progressing. 01/30/2019 on evaluation today patient appears to be doing excellent in regard to her ulcer. She has been tolerating the dressing changes without complication. Fortunately there is no signs of anything remaining open at this point which is excellent.  Readmission:06/26/2019 upon evaluation today patient presents for reevaluation here in the clinic concerning issues unfortunately that she has with new wounds to her left forearm, left upper arm, right lower extremity, and left lower extremity. She had 2 different falls which actually resulted in the skin tears as noted here. Fortunately there is no evidence of active infection. I previously have seen her for skin tears at that time as well. She responded extremely well with Xeroform at that point. Alginate did not do well at all for her in fact the alginate that the home health company used at that time got stuck into her wound on the leg and we had to have a fairly significant debridement to remove all this that definitely do not want to go that way again. I completely understand. Nonetheless I think she responded so well to the Xeroform at that time that would be my go to dressing of choice at this point as well in fact this point they have been using it already some of the wounds seem to be doing better. Her  left forearm is actually the worst of all the wounds. 07/02/2019 upon evaluation today patient appears to be doing better in regard to most of her original wounds that she had upon last evaluation. Unfortunately the left elbow is a new skin tear that appears to be at least a day old may be 2 but again the skin has not reattached to the point that I think we cannot try to stretch this out and see if we get it reattached. This is not a fresh wound does not right here in the office is not bleeding enough for that and I showed that to the patient's family member as well as he question whether or not getting her into the chair because the skin tear. Again I do not believe there is any way possible that is the case with the way its not actively bleeding at this time. Again I explained that even pulling the skin over there is no guarantee that we can be able to get this to completely reattached but we will give it our best. 07/10/2019 upon evaluation today patient actually appears to be doing much better and a lot of her wounds are actually improving quite significantly which is great news. There does not appear to be any evidence of active infection which is also great news. Overall I am extremely pleased with how she is doing.  She does have a new skin tear however which occurred sometime over the weekend according to her family member who is with her today. Nonetheless this is minimal working to keep an eye on this it appears to be pretty stable and we will see how it appears next week. 07/24/2019 upon evaluation today patient appears to be doing well with regard to her elbow which in fact appears to be completely healed. Her arm ulcer is showing hyper granulation I think getting to switch out the dressing as this is much too wet. She also apparently has a blister on her right leg which is new due to her having hit this likely though nobody seems to know when this occurred. Apparently the home health nurse  was to contact us concerning this although I have not heard from them up to this point. Electronic Signature(s) DENNETTE, FAULCONER ([id number]) Signed: 07/24/2019 2:47:30 PM By: Worthy Keeler PA-C Entered By: Worthy Keeler on 07/24/2019 14:47:29 Lwin, [name] ([id number]) -------------------------------------------------------------------------------- Otelia Sergeant TISS Details Patient Name: [name] Date of Service: 07/24/2019 2:15 PM Medical Record Number: [id number] Patient Account Number: [id number] Date of Birth/Sex: [birth year] (86 y.o. F) Treating RN: Cornell Barman Primary Care Provider: Ria Bush Other Clinician: Referring Provider: Ria Bush Treating Provider/Extender: Melburn Hake, Yazmyne Sara Weeks in Treatment: 4 Procedure Performed for: Wound #5 Left Forearm Performed By: Physician STONE III, Davelle Anselmi E., PA-C Post Procedure Diagnosis Same as Pre-procedure Notes 1 stick of silver nitrate used Electronic Signature(s) Signed: 07/24/2019 2:50:55 PM By: Worthy Keeler PA-C Entered By: Worthy Keeler on 07/24/2019 14:50:55 Hase, [name] ([id number]) -------------------------------------------------------------------------------- Physical Exam Details Patient Name: [name] Date of Service: 07/24/2019 2:15 PM Medical Record Number: [id number] Patient Account Number: [id number] Date of Birth/Sex: 09/09/1920 (86 y.o. F) Treating RN: Cornell Barman Primary Care Provider: Ria Bush Other Clinician: Referring Provider: Ria Bush Treating Provider/Extender: STONE III, Dilynn Munroe Weeks in Treatment: 4 Constitutional Well-nourished and well-hydrated in no acute distress. Respiratory normal breathing without difficulty. Psychiatric Patient is not able to cooperate in decision making regarding care. Patient is oriented to person only. pleasant and cooperative. Notes Upon inspection patient's wound bed actually showed signs of good  granulation at this time. There does not appear to be any evidence of active infection which is great news. No fevers, chills, nausea, vomiting, or diarrhea. Overall I am very pleased with how things seem to be progressing in general with the elbow healed and the arm though not doing quite as well still showing signs of not being infected which is great news. With regard to the patient's forearm this is hyper granulated and I am going to go ahead and perform chemical cauterization with silver nitrate today which she tolerated without complication and tolerated this quite well in fact. Lastly she does have a blister on the lateral portion of her leg which is going need to be watched closely there is no signs of infection at this point and I am not going to open this up currently as it is actually sealed up quite nicely at this point. Hopefully this will help maintain it from becoming infected. Electronic Signature(s) Signed: 07/24/2019 2:51:35 PM By: Worthy Keeler PA-C Entered By: Worthy Keeler on 07/24/2019 14:51:35 Huntley, [name] ([id number]) -------------------------------------------------------------------------------- Physician Orders Details Patient Name: [name] Date of Service: 07/24/2019 2:15 PM Medical Record Number: [id number] Patient Account Number: [id number] Date of Birth/Sex: [birth year] (86 y.o. F) Treating RN: Cornell Barman Primary Care Provider:  Ria Bush Other Clinician: Referring Provider: Ria Bush Treating Provider/Extender: Melburn Hake, Arvin Abello Weeks in Treatment: 4 Verbal / Phone Orders: No Diagnosis Coding ICD-10 Coding Code Description P10.258N Unspecified open wound of left upper arm, initial encounter S51.802A Unspecified open wound of left forearm, initial encounter S81.802A Unspecified open wound, left lower leg, initial encounter S81.801A Unspecified open wound, right lower leg, initial encounter F03.90 Unspecified dementia without  behavioral disturbance I10 Essential (primary) hypertension I77.82 Chronic diastolic (congestive) heart failure N18.30 Chronic kidney disease, stage 3 unspecified Wound Cleansing Wound #5 Left Forearm o Clean wound with Normal Saline. o May Shower, gently pat wound dry prior to applying new dressing. Primary Wound Dressing Wound #5 Left Forearm o Hydrafera Blue Ready Transfer - zinc surrounding periwound to prevent sticking Secondary Dressing Wound #5 Left Forearm o ABD and Kerlix/Conform - secure with netting o Other - ABD pad to the RIGHT leg hematoma/blister followed by roll gauze and tubigrip F Dressing Change Frequency Wound #5 Left Forearm o Change dressing every other day. Follow-up Appointments o Return Appointment in 3 weeks. Edema Control o Elevate legs to the level of the heart and pump ankles as often as possible o Other: - ABD pad to the RIGHT leg hematoma/blister followed by roll gauze and Jordan #5 Left Forearm o [place] Visits - [facility] please call [facility] with a fax number for orders o [place] Nurse may visit PRN to address patientos wound care needs. o FACE TO FACE ENCOUNTER: MEDICARE and MEDICAID PATIENTS: I certify that this patient is under my care and that I had a face-to-face encounter that meets the physician face-to-face encounter requirements with this patient on this date. The encounter with the patient was in whole or in part for the following MEDICAL CONDITION: (primary reason for [place]) MEDICAL NECESSITY: I certify, that based on my findings, NURSING services are a medically necessary home health service. HOME BOUND STATUS: I certify that my clinical findings support that this patient is homebound (i.e., Due to illness or injury, pt requires aid of supportive devices such as crutches, cane, wheelchairs, walkers, the use of special transportation or the assistance of another person to  leave their place of residence. There is a normal inability to leave the home and doing so requires considerable and taxing effort. Other absences are for medical reasons / religious services and are infrequent or of short duration when for other reasons). o If current dressing causes regression in wound condition, may D/C ordered dressing product/s and apply Normal Saline Moist Dressing daily until next [place] / Other MD appointment. [place] of regression in wound condition at [phone]. SMITH, MCNICHOLAS ([id number]) o Please direct any NON-WOUND related issues/requests for orders to patient's Primary Care Physician Electronic Signature(s) Signed: 07/24/2019 5:22:40 PM By: Worthy Keeler PA-C Entered By: Worthy Keeler on 07/24/2019 14:34:58 Pick, [name] ([id number]) -------------------------------------------------------------------------------- Problem List Details Patient Name: [name] Date of Service: 07/24/2019 2:15 PM Medical Record Number: [id number] Patient Account Number: [id number] Date of Birth/Sex: [birth year] (86 y.o. F) Treating RN: Cornell Barman Primary Care Provider: Ria Bush Other Clinician: Referring Provider: Ria Bush Treating Provider/Extender: Melburn Hake, Hilliard Borges Weeks in Treatment: 4 Active Problems ICD-10 Encounter Code Description Active Date MDM Diagnosis S41.102A Unspecified open wound of left upper arm, initial encounter 06/26/2019 No Yes S51.802A Unspecified open wound of left forearm, initial encounter 06/26/2019 No Yes S81.802A Unspecified open wound, left lower leg, initial encounter  06/26/2019 No Yes S81.801A Unspecified open wound, right lower leg, initial encounter 06/26/2019 No Yes F03.90 Unspecified dementia without behavioral disturbance 06/26/2019 No Yes I10 Essential (primary) hypertension 06/26/2019 No Yes B76.28 Chronic diastolic (congestive) heart failure 06/26/2019 No Yes N18.30 Chronic  kidney disease, stage 3 unspecified 06/26/2019 No Yes Inactive Problems Resolved Problems Electronic Signature(s) Signed: 07/24/2019 2:13:53 PM By: Worthy Keeler PA-C Entered By: Worthy Keeler on 07/24/2019 14:13:52 Tates, [name] ([id number]) -------------------------------------------------------------------------------- Progress Note Details Patient Name: [name] Date of Service: 07/24/2019 2:15 PM Medical Record Number: [id number] Patient Account Number: [id number] Date of Birth/Sex: 1920-03-10 (86 y.o. F) Treating RN: Cornell Barman Primary Care Provider: Ria Bush Other Clinician: Referring Provider: Ria Bush Treating Provider/Extender: Melburn Hake, Ellaina Schuler Weeks in Treatment: 4 Subjective Chief Complaint Information obtained from Patient Skin tear on the left upper arm, left forearm, left posterior lower leg, and right lateral lower leg. History of Present Illness (HPI) 12/15/2018 on evaluation today patient presents for initial evaluation here in our clinic concerning issues that she has been having at multiple locations with skin tears. She had a fall roughly 2 weeks ago where she injured her left upper arm and forearm at that time. Subsequently she ended up as well with a skin tear to her leg when her family members were trying to get her into the bed her skin is so fragile that as she was lifting her legs the skin on the back of the leg tore. That is on the right. Nonetheless they have been trying to manage these I did see her primary care provider earlier in the week who referred her to [name] for further evaluation and treatment. Currently there does not appear to be any signs of active infection at this time which is good news at any site. No fevers, chills, nausea, vomiting, or diarrhea. She does have pain but this is mainly on the leg ulcer the arms are really not to painful at this point. The patient does have a history of dementia, hypertension,  congestive heart failure, and chronic kidney disease stage III. 12/29/2018 on evaluation today patient appears to be doing better in regard to her skin tears on the forearm both are healing quite nicely one is close the other is slightly hyper granulated which is probably preventing complete epithelization. With regard to her right lower extremity unfortunately she is still having a lot of dressing material/alginate which is stuck to the wound bed. This seems to be a result of unfortunately having a different type of alginate than what we actually ordered for her. We had ordered silver cell as this was more nonstick unfortunately she received a different alginate which is breaking down and getting somewhat matted into the wound. In fact I would have to try to get some of this off today if at all possible. Other than that she seems to actually be healing nicely she has much less drainage than previously noted. 01/02/19 on evaluation today patient appears to be doing well with regard to her left forearm and right lower extremity ulcers. The left forearm appears to be completely healed which is great news the right leg is doing much better that there's still some eschar/dressing material stuck to the wound it appears to be healing underneath quite well. The Xeroform did much better for her. 01/09/2019 on evaluation today patient appears to be doing very well with regard to her right lower extremity ulcer. She has been tolerating the dressing changes without complication. Unfortunately she  still has a lot of the alginate dressing stuck to the wound bed that somewhat mad again. It looks like there may be some healing underneath some of these areas but nonetheless I feel like we really need to try to remove that today. This was discussed with the patient's daughter as well. The good news is the patient is really not having a lot of pain. 01/16/2019 upon evaluation today patient appears to be doing well with  regard to her wound. In fact this is measuring quite a bit smaller and overall I am very pleased with how things seem to be progressing. The patient seems to be doing well although she does have dementia her family member who is with her today is very pleased with how things seem to be progressing. 01/30/2019 on evaluation today patient appears to be doing excellent in regard to her ulcer. She has been tolerating the dressing changes without complication. Fortunately there is no signs of anything remaining open at this point which is excellent.  Readmission:06/26/2019 upon evaluation today patient presents for reevaluation here in the clinic concerning issues unfortunately that she has with new wounds to her left forearm, left upper arm, right lower extremity, and left lower extremity. She had 2 different falls which actually resulted in the skin tears as noted here. Fortunately there is no evidence of active infection. I previously have seen her for skin tears at that time as well. She responded extremely well with Xeroform at that point. Alginate did not do well at all for her in fact the alginate that the home health company used at that time got stuck into her wound on the leg and we had to have a fairly significant debridement to remove all this that definitely do not want to go that way again. I completely understand. Nonetheless I think she responded so well to the Xeroform at that time that would be my go to dressing of choice at this point as well in fact this point they have been using it already some of the wounds seem to be doing better. Her left forearm is actually the worst of all the wounds. 07/02/2019 upon evaluation today patient appears to be doing better in regard to most of her original wounds that she had upon last evaluation. Unfortunately the left elbow is a new skin tear that appears to be at least a day old may be 2 but again the skin has not reattached to the point that I think we  cannot try to stretch this out and see if we get it reattached. This is not a fresh wound does not right here in the office is not bleeding enough for that and I showed that to the patient's family member as well as he question whether or not getting her into the chair because the skin tear. Again I do not believe there is any way possible that is the case with the way its not actively bleeding at this time. Again I explained that even pulling the skin over there is no guarantee that we can be able to get this to completely reattached but we will give it our best. 07/10/2019 upon evaluation today patient actually appears to be doing much better and a lot of her wounds are actually improving quite significantly which is great news. There does not appear to be any evidence of active infection which is also great news. Overall I am extremely pleased with how she is doing. She does have a new skin  tear however which occurred sometime over the weekend according to her family member who is with her today. Nonetheless this is minimal working to keep an eye on this it appears to be pretty stable and we will see how it appears next week. 07/24/2019 upon evaluation today patient appears to be doing well with regard to her elbow which in fact appears to be completely healed. Her arm ulcer is showing hyper granulation I think getting to switch out the dressing as this is much too wet. She also apparently has a blister on her right leg which is new due to her having hit this likely though nobody seems to know when this occurred. Apparently the home health nurse was [name], [name]. ([id number]) to contact us concerning this although I have not heard from them up to this point. Objective Constitutional Well-nourished and well-hydrated in no acute distress. Vitals Time Taken: 2:15 PM, Height: 62 in, Weight: 113 lbs, BMI: 20.7, Temperature: 98.1 F, Pulse: 85 bpm, Respiratory Rate: 16 breaths/min, Blood Pressure:  128/74 mmHg. Respiratory normal breathing without difficulty. Psychiatric Patient is not able to cooperate in decision making regarding care. Patient is oriented to person only. pleasant and cooperative. General Notes: Upon inspection patient's wound bed actually showed signs of good granulation at this time. There does not appear to be any evidence of active infection which is great news. No fevers, chills, nausea, vomiting, or diarrhea. Overall I am very pleased with how things seem to be progressing in general with the elbow healed and the arm though not doing quite as well still showing signs of not being infected which is great news. With regard to the patient's forearm this is hyper granulated and I am going to go ahead and perform chemical cauterization with silver nitrate today which she tolerated without complication and tolerated this quite well in fact. Lastly she does have a blister on the lateral portion of her leg which is going need to be watched closely there is no signs of infection at this point and I am not going to open this up currently as it is actually sealed up quite nicely at this point. Hopefully this will help maintain it from becoming infected. Integumentary (Hair, Skin) Wound #5 status is Open. Original cause of wound was Trauma. The wound is located on the Left Forearm. The wound measures 1.3cm length x 4cm width x 0.1cm depth; 4.084cm^2 area and 0.408cm^3 volume. There is Fat Layer (Subcutaneous Tissue) Exposed exposed. There is no tunneling or undermining noted. There is a small amount of serous drainage noted. The wound margin is distinct with the outline attached to the wound base. There is large (67-100%) hyper - granulation within the wound bed. There is no necrotic tissue within the wound bed. Wound #8 status is Healed - Epithelialized. Original cause of wound was Trauma. The wound is located on the Left Elbow. The wound measures 0cm length x 0cm width x 0cm  depth; 0cm^2 area and 0cm^3 volume. Other Condition(s) Patient presents with Other Dermatologic Condition located on the Right Leg. General Notes: 4.5 x 4.8 fluctuant hematoma Assessment Active Problems ICD-10 Unspecified open wound of left upper arm, initial encounter Unspecified open wound of left forearm, initial encounter Unspecified open wound, left lower leg, initial encounter Unspecified open wound, right lower leg, initial encounter Unspecified dementia without behavioral disturbance Essential (primary) hypertension Chronic diastolic (congestive) heart failure Chronic kidney disease, stage 3 unspecified Procedures Wound #5 Pre-procedure diagnosis of Wound #5 is a Trauma, Other located  on the Left Forearm . An CHEM CAUT GRANULATION TISS procedure was performed by STONE III, Tyke Outman E., PA-C. Post procedure Diagnosis Wound #5: Same as Pre-Procedure Bowker, Naylah W. ([id number]) Notes: 1 stick of silver nitrate used Plan Wound Cleansing: Wound #5 Left Forearm: Clean wound with Normal Saline. May Shower, gently pat wound dry prior to applying new dressing. Primary Wound Dressing: Wound #5 Left Forearm: Hydrafera Blue Ready Transfer - zinc surrounding periwound to prevent sticking Secondary Dressing: Wound #5 Left Forearm: ABD and Kerlix/Conform - secure with netting Other - ABD pad to the RIGHT leg hematoma/blister followed by roll gauze and tubigrip F Dressing Change Frequency: Wound #5 Left Forearm: Change dressing every other day. Follow-up Appointments: Return Appointment in 3 weeks. Edema Control: Elevate legs to the level of the heart and pump ankles as often as possible Other: - ABD pad to the RIGHT leg hematoma/blister followed by roll gauze and tubigrip F Home Health: Wound #5 Left Forearm: Continue Home Health Visits - [facility] please call [facility] with a fax number for orders Home Health Nurse may visit PRN to address patient s wound care needs. FACE TO FACE  ENCOUNTER: MEDICARE and MEDICAID PATIENTS: I certify that this patient is under my care and that I had a face-to-face encounter that meets the physician face-to-face encounter requirements with this patient on this date. The encounter with the patient was in whole or in part for the following MEDICAL CONDITION: (primary reason for [place]) MEDICAL NECESSITY: I certify, that based on my findings, NURSING services are a medically necessary home health service. HOME BOUND STATUS: I certify that my clinical findings support that this patient is homebound (i.e., Due to illness or injury, pt requires aid of supportive devices such as crutches, cane, wheelchairs, walkers, the use of special transportation or the assistance of another person to leave their place of residence. There is a normal inability to leave the home and doing so requires considerable and taxing effort. Other absences are for medical reasons / religious services and are infrequent or of short duration when for other reasons). If current dressing causes regression in wound condition, may D/C ordered dressing product/s and apply Normal Saline Moist Dressing daily until next [place] / Other MD appointment. [place] of regression in wound condition at [phone]. Please direct any NON-WOUND related issues/requests for orders to patient's Primary Care Physician 1. I would recommend currently that we switch to [facility] dressing at this time and I do think that the patient seems to be doing decently well. Hopefully she will continue to show signs of good improvement. 2. I am also can recommend at this time that we use an ABD pad along with Tubigrip on the right leg in order to cover the blister area where she has a blister/hematoma to hopefully protect this while it reabsorbs. This will better than opening as it helps to prevent infection. 3. I am going recommend that we continue to monitor for any  signs of infection or worsening anything changes they will contact the office and let me know. We will see patient back for reevaluation in 3 weeks here in the clinic. If anything worsens or changes patient will contact our office for additional recommendations. Electronic Signature(s) Signed: 07/24/2019 2:56:57 PM By: Worthy Keeler PA-C Entered By: Worthy Keeler on 07/24/2019 14:56:57 Spratley, [name] ([id number]) -------------------------------------------------------------------------------- SuperBill Details Patient Name: [name] Date of Service: 07/24/2019 Medical Record Number: [id number] Patient Account Number:  [id number] Date of Birth/Sex: Sep 10, 1920 (86 y.o. F) Treating RN: Cornell Barman Primary Care Provider: Ria Bush Other Clinician: Referring Provider: Ria Bush Treating Provider/Extender: Melburn Hake, Tiron Suski Weeks in Treatment: 4 Diagnosis Coding ICD-10 Codes Code Description W54.627O Unspecified open wound of left upper arm, initial encounter S51.802A Unspecified open wound of left forearm, initial encounter S81.802A Unspecified open wound, left lower leg, initial encounter S81.801A Unspecified open wound, right lower leg, initial encounter F03.90 Unspecified dementia without behavioral disturbance I10 Essential (primary) hypertension J50.09 Chronic diastolic (congestive) heart failure N18.30 Chronic kidney disease, stage 3 unspecified Facility Procedures CPT4 Code: 38182993 Description: 71696 - CHEM CAUT GRANULATION TISS Modifier: Quantity: 1 CPT4 Code: Description: ICD-10 Diagnosis Description S51.802A Unspecified open wound of left forearm, initial encounter Modifier: Quantity: Physician Procedures CPT4 Code: 7893810 Description: 99213 - WC PHYS LEVEL 3 - EST PT Modifier: 25 Quantity: 1 CPT4 Code: Description: ICD-10 Diagnosis Description S81.801A Unspecified open wound, right lower leg, initial encounter S41.102A Unspecified open  wound of left upper arm, initial encounter I10 Essential (primary) hypertension F75.10 Chronic diastolic (congestive)  heart failure Modifier: Quantity: CPT4 Code: 2585277 Description: 82423 - WC PHYS CHEM CAUT GRAN TISSUE Modifier: Quantity: 1 CPT4 Code: Description: ICD-10 Diagnosis Description S51.802A Unspecified open wound of left forearm, initial encounter Modifier: Quantity: Electronic Signature(s) Signed: 07/24/2019 2:58:27 PM By: Worthy Keeler PA-C Entered By: Worthy Keeler on 07/24/2019 14:58:26

## 2019-07-25 NOTE — Progress Notes (Signed)
AUDELIA, KNAPE ([id number]) Visit Report for 07/24/2019 Arrival Information Details Patient Name: [name], [name] Date of Service: 07/24/2019 2:15 PM Medical Record Number: [id number] Patient Account Number: [id number] Date of Birth/Sex: February 25, 1920 (86 y.o. F) Treating RN: Cornell Barman Primary Care Darneisha Windhorst: Ria Bush Other Clinician: Referring Keanan Melander: Ria Bush Treating Carden Teel/Extender: Melburn Hake, HOYT Weeks in Treatment: 4 Visit Information History Since Last Visit Added or deleted any medications: No Patient Arrived: Wheel Chair Any new allergies or adverse reactions: No Arrival Time: 14:11 Had a fall or experienced change in No Accompanied By: son activities of daily living that may affect Transfer Assistance: None risk of falls: Patient Identification Verified: Yes Signs or symptoms of abuse/neglect since last visito No Secondary Verification Process Completed: Yes Hospitalized since last visit: No Implantable device outside of the clinic excluding No cellular tissue based products placed in the center since last visit: Has Dressing in Place as Prescribed: Yes Pain Present Now: No Electronic Signature(s) Signed: 07/24/2019 4:50:25 PM By: Lorine Bears RCP, RRT, CHT Entered By: Lorine Bears on 07/24/2019 14:12:01 Banfill, [name] ([id number]) -------------------------------------------------------------------------------- Encounter Discharge Information Details Patient Name: [name] Date of Service: 07/24/2019 2:15 PM Medical Record Number: [id number] Patient Account Number: [id number] Date of Birth/Sex: February 02, 1920 (86 y.o. F) Treating RN: Cornell Barman Primary Care Loic Hobin: Ria Bush Other Clinician: Referring Janyiah Silveri: Ria Bush Treating Muna Demers/Extender: Melburn Hake, HOYT Weeks in Treatment: 4 Encounter Discharge Information Items Discharge Condition: Stable Ambulatory Status:  Wheelchair Discharge Destination: Home Transportation: Private Auto Accompanied By: son in law Schedule Follow-up Appointment: Yes Clinical Summary of Care: Electronic Signature(s) Signed: 07/24/2019 5:10:33 PM By: Gretta Cool, BSN, RN, CWS, Kim RN, BSN Entered By: Gretta Cool, BSN, RN, CWS, Kim on 07/24/2019 17:10:33 Finkel, [name] ([id number]) -------------------------------------------------------------------------------- Multi Wound Chart Details Patient Name: [name] Date of Service: 07/24/2019 2:15 PM Medical Record Number: [id number] Patient Account Number: [id number] Date of Birth/Sex: 1920-04-08 (86 y.o. F) Treating RN: Cornell Barman Primary Care Loic Hobin: Ria Bush Other Clinician: Referring Rasheka Denard: Ria Bush Treating Avier Jech/Extender: Melburn Hake, HOYT Weeks in Treatment: 4 Vital Signs Height(in): 62 Pulse(bpm): 85 Weight(lbs): 113 Blood Pressure(mmHg): 128/74 Body Mass Index(BMI): 21 Temperature(F): 98.1 Respiratory Rate(breaths/min): 16 Photos: [8:No Photos] [N/A:N/A] Wound Location: Left Forearm Left Elbow N/A Wounding Event: Trauma Trauma N/A Primary Etiology: Trauma, Other Trauma, Other N/A Comorbid History: Congestive Heart Failure N/A N/A Date Acquired: 06/12/2019 07/01/2019 N/A Weeks of Treatment: 4 3 N/A Wound Status: Open Healed - Epithelialized N/A Measurements L x W x D (cm) 1.3x4x0.1 0x0x0 N/A Area (cm) : 4.084 0 N/A Volume (cm) : 0.408 0 N/A % Reduction in Area: 42.20% 100.00% N/A % Reduction in Volume: 42.30% 100.00% N/A Classification: Full Thickness Without Exposed Full Thickness Without Exposed N/A Support Structures Support Structures Exudate Amount: Small N/A N/A Exudate Type: Serous N/A N/A Exudate Color: amber N/A N/A Wound Margin: Distinct, outline attached N/A N/A Granulation Amount: Large (67-100%) N/A N/A Necrotic Amount: None Present (0%) N/A N/A Exposed Structures: Fat Layer (Subcutaneous Tissue) N/A N/A Exposed:  Yes Procedures Performed: CHEM CAUT GRANULATION TISS N/A N/A Treatment Notes Electronic Signature(s) Signed: 07/24/2019 5:04:49 PM By: Gretta Cool, BSN, RN, CWS, Kim RN, BSN Entered By: Gretta Cool, BSN, RN, CWS, Kim on 07/24/2019 17:04:48 Cavendish, [name] ([id number]) -------------------------------------------------------------------------------- [place] Details Patient Name: [name] Date of Service: 07/24/2019 2:15 PM Medical Record Number: [id number] Patient Account Number: [id number] Date of Birth/Sex: [birth year] (86 y.o. F) Treating RN: Cornell Barman Primary Care Johann Santone: Ria Bush Other  Clinician: Referring Marquee Fuchs: Ria Bush Treating Ciarah Peace/Extender: Melburn Hake, HOYT Weeks in Treatment: 4 Active Inactive Abuse / Safety / Falls / Self Care Management Nursing Diagnoses: Potential for falls Goals: Patient will remain injury free related to falls Date Initiated: 06/26/2019 Target Resolution Date: 09/29/2019 Goal Status: Active Interventions: Assess fall risk on admission and as needed Notes: Orientation to the Wound Care Program Nursing Diagnoses: Knowledge deficit related to the wound healing center program Goals: Patient/caregiver will verbalize understanding of the [place] Program Date Initiated: 06/26/2019 Target Resolution Date: 09/29/2019 Goal Status: Active Interventions: Provide education on orientation to the wound center Notes: Wound/Skin Impairment Nursing Diagnoses: Impaired tissue integrity Goals: Ulcer/skin breakdown will heal within 14 weeks Date Initiated: 06/26/2019 Target Resolution Date: 09/29/2019 Goal Status: Active Interventions: Assess patient/caregiver ability to obtain necessary supplies Assess patient/caregiver ability to perform ulcer/skin care regimen upon admission and as needed Assess ulceration(s) every visit Notes: Electronic Signature(s) Signed: 07/24/2019 5:04:37 PM By: Gretta Cool, BSN, RN, CWS,  Kim RN, BSN Entered By: Gretta Cool, BSN, RN, CWS, Kim on 07/24/2019 17:04:36 Godek, [name] ([id number]) -------------------------------------------------------------------------------- Non-Wound Condition Assessment Details Patient Name: [name] Date of Service: 07/24/2019 2:15 PM Medical Record Number: [id number] Patient Account Number: [id number] Date of Birth/Sex: 01/21/1921 (86 y.o. F) Treating RN: Cornell Barman Primary Care Donyel Nester: Ria Bush Other Clinician: Referring Wes Lezotte: Ria Bush Treating Murrell Dome/Extender: STONE III, HOYT Weeks in Treatment: 4 Non-Wound Condition: Condition: Other Dermatologic Condition Location: Leg Side: Right Notes: Hematoma Notes 4.5 x 4.8 fluctuant hematoma Electronic Signature(s) Signed: 07/24/2019 5:22:40 PM By: Worthy Keeler PA-C Signed: 07/24/2019 5:48:04 PM By: Gretta Cool, BSN, RN, CWS, Kim RN, BSN Entered By: Worthy Keeler on 07/24/2019 14:30:45 Heidler, [name] ([id number]) -------------------------------------------------------------------------------- Pain Assessment Details Patient Name: [name] Date of Service: 07/24/2019 2:15 PM Medical Record Number: [id number] Patient Account Number: [id number] Date of Birth/Sex: 1920-06-24 (86 y.o. F) Treating RN: Cornell Barman Primary Care Tesneem Dufrane: Ria Bush Other Clinician: Referring Rani Sisney: Ria Bush Treating Anjolina Byrer/Extender: Melburn Hake, HOYT Weeks in Treatment: 4 Active Problems Location of Pain Severity and Description of Pain Patient Has Paino No Site Locations Pain Management and Medication Current Pain Management: Electronic Signature(s) Signed: 07/24/2019 4:29:39 PM By: Sandre Kitty Signed: 07/24/2019 5:48:04 PM By: Gretta Cool, BSN, RN, CWS, Kim RN, BSN Entered By: Sandre Kitty on 07/24/2019 14:17:13 Golladay, [name] ([id number]) -------------------------------------------------------------------------------- Patient/Caregiver  Education Details Patient Name: [name] Date of Service: 07/24/2019 2:15 PM Medical Record Number: [id number] Patient Account Number: [id number] Date of Birth/Gender: Feb 27, 1920 (86 y.o. F) Treating RN: Cornell Barman Primary Care Physician: Ria Bush Other Clinician: Referring Physician: Ria Bush Treating Physician/Extender: Sharalyn Ink in Treatment: 4 Education Assessment Education Provided To: Patient Education Topics Provided Venous: Controlling Swelling with Compression Stockings , Controlling Swelling with Multilayered Compression Wraps, Other: right stockings, Handouts: left velcro wrap Wound/Skin Impairment: Handouts: Caring for Your Ulcer Methods: Demonstration, Explain/Verbal Responses: State content correctly Electronic Signature(s) Signed: 07/24/2019 5:48:04 PM By: Gretta Cool, BSN, RN, CWS, Kim RN, BSN Entered By: Gretta Cool, BSN, RN, CWS, Kim on 07/24/2019 17:05:57 Panek, [name] ([id number]) -------------------------------------------------------------------------------- Wound Assessment Details Patient Name: [name] Date of Service: 07/24/2019 2:15 PM Medical Record Number: [id number] Patient Account Number: [id number] Date of Birth/Sex: 03-09-1920 (86 y.o. F) Treating RN: Cornell Barman Primary Care Kizzie Cotten: Ria Bush Other Clinician: Referring Jyren Cerasoli: Ria Bush Treating Kaylan Friedmann/Extender: STONE III, HOYT Weeks in Treatment: 4 Wound Status Wound Number: 5 Primary Etiology: Trauma, Other Wound Location: Left Forearm Wound Status: Open  Wounding Event: Trauma Comorbid History: Congestive Heart Failure Date Acquired: 06/12/2019 Weeks Of Treatment: 4 Clustered Wound: No Photos Wound Measurements Length: (cm) 1.3 Width: (cm) 4 Depth: (cm) 0.1 Area: (cm) 4.084 Volume: (cm) 0.408 % Reduction in Area: 42.2% % Reduction in Volume: 42.3% Tunneling: No Undermining: No Wound Description Classification: Full  Thickness Without Exposed Support Structu Wound Margin: Distinct, outline attached Exudate Amount: Small Exudate Type: Serous Exudate Color: amber res Foul Odor After Cleansing: No Slough/Fibrino No Wound Bed Granulation Amount: Large (67-100%) Exposed Structure Granulation Quality: Hyper-granulation Fat Layer (Subcutaneous Tissue) Exposed: Yes Necrotic Amount: None Present (0%) Treatment Notes Wound #5 (Left Forearm) Notes contact layer, gauze, conform, Stretch net Electronic Signature(s) Signed: 07/24/2019 5:22:40 PM By: Worthy Keeler PA-C Signed: 07/24/2019 5:48:04 PM By: Gretta Cool, BSN, RN, CWS, Kim RN, BSN Entered By: Worthy Keeler on 07/24/2019 14:24:24 Mckiddy, [name] ([id number]) Wieman, [name] ([id number]) -------------------------------------------------------------------------------- Wound Assessment Details Patient Name: [name] Date of Service: 07/24/2019 2:15 PM Medical Record Number: [id number] Patient Account Number: [id number] Date of Birth/Sex: 1920/04/24 (86 y.o. F) Treating RN: Cornell Barman Primary Care Honora Searson: Ria Bush Other Clinician: Referring Jahlia Omura: Ria Bush Treating Raymondo Garcialopez/Extender: Melburn Hake, HOYT Weeks in Treatment: 4 Wound Status Wound Number: 8 Primary Etiology: Trauma, Other Wound Location: Left Elbow Wound Status: Healed - Epithelialized Wounding Event: Trauma Date Acquired: 07/01/2019 Weeks Of Treatment: 3 Clustered Wound: No Wound Measurements Length: (cm) Width: (cm) Depth: (cm) Area: (cm) Volume: (cm) 0 % Reduction in Area: 100% 0 % Reduction in Volume: 100% 0 0 0 Wound Description Classification: Full Thickness Without Exposed Support Structu res Electronic Signature(s) Signed: 07/24/2019 4:29:39 PM By: Sandre Kitty Signed: 07/24/2019 5:48:04 PM By: Gretta Cool, BSN, RN, CWS, Kim RN, BSN Entered By: Sandre Kitty on 07/24/2019 14:20:58 Shear, [name]  ([id number]) -------------------------------------------------------------------------------- Vitals Details Patient Name: [name] Date of Service: 07/24/2019 2:15 PM Medical Record Number: [id number] Patient Account Number: [id number] Date of Birth/Sex: 07/28/1920 (86 y.o. F) Treating RN: Cornell Barman Primary Care Leanndra Pember: Ria Bush Other Clinician: Referring Aubrionna Istre: Ria Bush Treating Dustine Stickler/Extender: Melburn Hake, HOYT Weeks in Treatment: 4 Vital Signs Time Taken: 14:15 Temperature (F): 98.1 Height (in): 62 Pulse (bpm): 85 Weight (lbs): 113 Respiratory Rate (breaths/min): 16 Body Mass Index (BMI): 20.7 Blood Pressure (mmHg): 128/74 Reference Range: 80 - 120 mg / dl Electronic Signature(s) Signed: 07/24/2019 4:50:25 PM By: Lorine Bears RCP, RRT, CHT Entered By: Lorine Bears on 07/24/2019 14:16:29

## 2019-07-26 DIAGNOSIS — E440 Moderate protein-calorie malnutrition: Secondary | ICD-10-CM | POA: Diagnosis not present

## 2019-07-26 DIAGNOSIS — E039 Hypothyroidism, unspecified: Secondary | ICD-10-CM | POA: Diagnosis not present

## 2019-07-26 DIAGNOSIS — N1832 Chronic kidney disease, stage 3b: Secondary | ICD-10-CM | POA: Diagnosis not present

## 2019-07-26 DIAGNOSIS — I130 Hypertensive heart and chronic kidney disease with heart failure and stage 1 through stage 4 chronic kidney disease, or unspecified chronic kidney disease: Secondary | ICD-10-CM | POA: Diagnosis not present

## 2019-07-26 DIAGNOSIS — E559 Vitamin D deficiency, unspecified: Secondary | ICD-10-CM | POA: Diagnosis not present

## 2019-07-26 DIAGNOSIS — Z85038 Personal history of other malignant neoplasm of large intestine: Secondary | ICD-10-CM | POA: Diagnosis not present

## 2019-07-26 DIAGNOSIS — S41112D Laceration without foreign body of left upper arm, subsequent encounter: Secondary | ICD-10-CM | POA: Diagnosis not present

## 2019-07-26 DIAGNOSIS — Z9181 History of falling: Secondary | ICD-10-CM | POA: Diagnosis not present

## 2019-07-26 DIAGNOSIS — I5032 Chronic diastolic (congestive) heart failure: Secondary | ICD-10-CM | POA: Diagnosis not present

## 2019-07-26 DIAGNOSIS — Z86010 Personal history of colonic polyps: Secondary | ICD-10-CM | POA: Diagnosis not present

## 2019-07-26 DIAGNOSIS — S81811D Laceration without foreign body, right lower leg, subsequent encounter: Secondary | ICD-10-CM | POA: Diagnosis not present

## 2019-07-26 DIAGNOSIS — S81812D Laceration without foreign body, left lower leg, subsequent encounter: Secondary | ICD-10-CM | POA: Diagnosis not present

## 2019-08-02 ENCOUNTER — Other Ambulatory Visit: Payer: Self-pay

## 2019-08-02 ENCOUNTER — Other Ambulatory Visit: Payer: Medicare Other | Admitting: Hospice

## 2019-08-02 DIAGNOSIS — Z515 Encounter for palliative care: Secondary | ICD-10-CM

## 2019-08-02 DIAGNOSIS — I5032 Chronic diastolic (congestive) heart failure: Secondary | ICD-10-CM

## 2019-08-02 DIAGNOSIS — E46 Unspecified protein-calorie malnutrition: Secondary | ICD-10-CM

## 2019-08-02 NOTE — Progress Notes (Signed)
[facility] Consult Note Telephone: [phone]  Fax: [phone]  PATIENT NAME: [name] DOB: 1920/05/01 MRN: [id number]  PRIMARY CARE PROVIDER:   Ria Bush, MD  REFERRING PROVIDER: Ria Bush, MD  RESPONSIBLE PARTY: Self - [name] daughter- [phone] Contact: [name] [phone]     RECOMMENDATIONS/PLAN:   Advance Care Planning/Goals of Care: Visit at the request of Traci Bush, MD  for palliative consult. Visit consisted of building trust and discussions on Palliative Medicine as specialized medical care for people living with serious illness, aimed at facilitating better quality of life through symptoms relief, assisting with advance care plan and establishing goals of care.  Extensive discussion on CODE STATUS as it relates to advance care planning.  [name] elected DO NOT RESUSCITATE.  DNR form signed and left at home; same uploaded to epic today.  Goals of care include to maximize quality of life and symptom management.  Visit consisted of counseling and education dealing with the complex and emotionally intense issues of symptom management and palliative care in the setting of serious and potentially life-threatening illness.  Strong family network/support noted.  Palliative care team will continue to support patient, patient's family, and medical team.  In person visit for further discussion on  goals of care clarification scheduled for 10/04/2019. Symptom management: No recent fall. No hospitalization. [facility] nurse visit once a week for wound dressing changes to left wrist. Patient ambulates with her rolling walker within her home, stand by assist. She is incontinent of bowel and bladder. [name] and spouse at home with patient; no complaints/concerns.  Patient's appetite is fair/good, improved from poor. Appointment with PCP 08/27/2019.  Patient enjoys doing puzzles.  Patient is compliant with her  medication; continues on Amlodipine, furosemide Levothyroxine, Omeprazole.  Patient denies cough,, shortness of breath, pain or discomfort.  Patient in no acute distress.  Encouraged ongoing care. Follow up: Palliative care will continue to follow patient for goals of care clarification and symptom management. I spent 1 hour and 20 minutes providing this initial consultation; time iincludes time spent with patient/family, chart review, provider coordination,  and documentation. More than 50% of the time in this consultation was spent on coordinating communication  HISTORY OF PRESENT ILLNESS:  [name] is a 86 y.o. year old female with multiple medical problems including Protein caloric malnutrition, HTN Hypothyroidism, hx of malignant neoplasm of GI tract.  Palliative Care was asked to help address goals of care.   CODE STATUS: DNR  PPS: 40% HOSPICE ELIGIBILITY/DIAGNOSIS: TBD  PAST MEDICAL HISTORY:  Past Medical History:  Diagnosis Date  . Acute hemorrhagic cystitis 12/04/2014  . Anemia 12/30-01/25/2006   [facility]- CHF- started lasix  . CKD (chronic kidney disease), stage III 03/04/2010  . COPD (chronic obstructive pulmonary disease) ([place]) 02/2014   by CXR and VQ scan  . Diastolic CHF ([place])   . Gross hematuria 12/31/2014  . History of colon cancer    s/p partial colectomy  . History of ETT 03/15/2006   adenosine myoview low risk  . Hyperlipidemia 12/2002  . Hypertension 10/1998  . Hypothyroidism   . Left displaced femoral neck fracture ([place]) 02/01/2014  . Microhematuria 03/2013   watchful waiting ([place])  . Right lower lobe pneumonia 03/10/2011  . Vaginal delivery    x4, at home    SOCIAL HX:  Social History   Tobacco Use  . Smoking status: Never Smoker  . Smokeless tobacco: Never Used  Substance Use Topics  . Alcohol use: No    ALLERGIES: No Known Allergies   PERTINENT MEDICATIONS:  Outpatient Encounter Medications as of 08/02/2019  Medication Sig  . acetaminophen  (TYLENOL) 325 MG tablet Take 2 tablets (650 mg total) by mouth every 6 (six) hours as needed for mild pain or moderate pain (or Fever >/= 101).  Marland Kitchen amLODipine (NORVASC) 5 MG tablet Take 1 tablet (5 mg total) by mouth daily. For blood pressure  . Cholecalciferol (VITAMIN D3) 1000 UNITS CAPS Take by mouth 2 (two) times daily.    . Cyanocobalamin (VITAMIN B-12) 500 MCG TABS Take 500 mcg by mouth daily.  . ferrous sulfate 325 (65 FE) MG tablet Take 1 tablet (325 mg total) by mouth every Monday, Wednesday, and Friday.  . furosemide (LASIX) 20 MG tablet Take 0.5 tablets (10 mg total) by mouth daily as needed for fluid (leg swelling).  Marland Kitchen levothyroxine (SYNTHROID) 25 MCG tablet TAKE 1 TABLET BY MOUTH DAILY BEFORE BREAKFAST for thyroid.  . Multiple Vitamins-Minerals (EYE VITAMINS & MINERALS PO) Take 1 tablet by mouth daily. [facility]  . omeprazole (PRILOSEC OTC) 20 MG tablet Take 1 tablet (20 mg total) by mouth daily as needed (reflux/heartburn).   No facility-administered encounter medications on file as of 08/02/2019.    PHYSICAL EXAM/ROS:  General: NAD, frail appearing, thin Cardiovascular: regular rate and rhythm; denies chest pain Pulmonary: clear ant/post fields; no adventitious lung sounds auscultated, no coughing, no shortness of breath Abdomen: soft, nontender, + bowel sounds GU: no suprapubic tenderness Extremities: Dressing to wound on left wrist and right calf; dressing clean dry and intact Skin: no rashes to exposed skin Neurological: Weakness but otherwise nonfocal  Teodoro Spray, NP

## 2019-08-03 DIAGNOSIS — E440 Moderate protein-calorie malnutrition: Secondary | ICD-10-CM | POA: Diagnosis not present

## 2019-08-03 DIAGNOSIS — I130 Hypertensive heart and chronic kidney disease with heart failure and stage 1 through stage 4 chronic kidney disease, or unspecified chronic kidney disease: Secondary | ICD-10-CM | POA: Diagnosis not present

## 2019-08-03 DIAGNOSIS — N1832 Chronic kidney disease, stage 3b: Secondary | ICD-10-CM | POA: Diagnosis not present

## 2019-08-03 DIAGNOSIS — S81812D Laceration without foreign body, left lower leg, subsequent encounter: Secondary | ICD-10-CM | POA: Diagnosis not present

## 2019-08-03 DIAGNOSIS — Z85038 Personal history of other malignant neoplasm of large intestine: Secondary | ICD-10-CM | POA: Diagnosis not present

## 2019-08-03 DIAGNOSIS — S41112D Laceration without foreign body of left upper arm, subsequent encounter: Secondary | ICD-10-CM | POA: Diagnosis not present

## 2019-08-03 DIAGNOSIS — S81811D Laceration without foreign body, right lower leg, subsequent encounter: Secondary | ICD-10-CM | POA: Diagnosis not present

## 2019-08-03 DIAGNOSIS — E039 Hypothyroidism, unspecified: Secondary | ICD-10-CM | POA: Diagnosis not present

## 2019-08-03 DIAGNOSIS — E559 Vitamin D deficiency, unspecified: Secondary | ICD-10-CM | POA: Diagnosis not present

## 2019-08-03 DIAGNOSIS — I5032 Chronic diastolic (congestive) heart failure: Secondary | ICD-10-CM | POA: Diagnosis not present

## 2019-08-03 DIAGNOSIS — Z86010 Personal history of colonic polyps: Secondary | ICD-10-CM | POA: Diagnosis not present

## 2019-08-03 DIAGNOSIS — Z9181 History of falling: Secondary | ICD-10-CM | POA: Diagnosis not present

## 2019-08-08 DIAGNOSIS — N1832 Chronic kidney disease, stage 3b: Secondary | ICD-10-CM | POA: Diagnosis not present

## 2019-08-08 DIAGNOSIS — I5032 Chronic diastolic (congestive) heart failure: Secondary | ICD-10-CM | POA: Diagnosis not present

## 2019-08-08 DIAGNOSIS — E039 Hypothyroidism, unspecified: Secondary | ICD-10-CM | POA: Diagnosis not present

## 2019-08-08 DIAGNOSIS — Z9181 History of falling: Secondary | ICD-10-CM | POA: Diagnosis not present

## 2019-08-08 DIAGNOSIS — Z86010 Personal history of colonic polyps: Secondary | ICD-10-CM | POA: Diagnosis not present

## 2019-08-08 DIAGNOSIS — I130 Hypertensive heart and chronic kidney disease with heart failure and stage 1 through stage 4 chronic kidney disease, or unspecified chronic kidney disease: Secondary | ICD-10-CM | POA: Diagnosis not present

## 2019-08-08 DIAGNOSIS — S41112D Laceration without foreign body of left upper arm, subsequent encounter: Secondary | ICD-10-CM | POA: Diagnosis not present

## 2019-08-08 DIAGNOSIS — E559 Vitamin D deficiency, unspecified: Secondary | ICD-10-CM | POA: Diagnosis not present

## 2019-08-08 DIAGNOSIS — S81812D Laceration without foreign body, left lower leg, subsequent encounter: Secondary | ICD-10-CM | POA: Diagnosis not present

## 2019-08-08 DIAGNOSIS — Z85038 Personal history of other malignant neoplasm of large intestine: Secondary | ICD-10-CM | POA: Diagnosis not present

## 2019-08-08 DIAGNOSIS — E440 Moderate protein-calorie malnutrition: Secondary | ICD-10-CM | POA: Diagnosis not present

## 2019-08-08 DIAGNOSIS — S81811D Laceration without foreign body, right lower leg, subsequent encounter: Secondary | ICD-10-CM | POA: Diagnosis not present

## 2019-08-14 ENCOUNTER — Encounter: Payer: Medicare Other | Attending: Physician Assistant | Admitting: Physician Assistant

## 2019-08-14 ENCOUNTER — Other Ambulatory Visit: Payer: Self-pay

## 2019-08-14 DIAGNOSIS — N183 Chronic kidney disease, stage 3 unspecified: Secondary | ICD-10-CM | POA: Diagnosis not present

## 2019-08-14 DIAGNOSIS — I130 Hypertensive heart and chronic kidney disease with heart failure and stage 1 through stage 4 chronic kidney disease, or unspecified chronic kidney disease: Secondary | ICD-10-CM | POA: Insufficient documentation

## 2019-08-14 DIAGNOSIS — W19XXXA Unspecified fall, initial encounter: Secondary | ICD-10-CM | POA: Insufficient documentation

## 2019-08-14 DIAGNOSIS — I5032 Chronic diastolic (congestive) heart failure: Secondary | ICD-10-CM | POA: Diagnosis not present

## 2019-08-14 DIAGNOSIS — S51802A Unspecified open wound of left forearm, initial encounter: Secondary | ICD-10-CM | POA: Diagnosis not present

## 2019-08-14 DIAGNOSIS — F0390 Unspecified dementia without behavioral disturbance: Secondary | ICD-10-CM | POA: Insufficient documentation

## 2019-08-14 DIAGNOSIS — S80821A Blister (nonthermal), right lower leg, initial encounter: Secondary | ICD-10-CM | POA: Insufficient documentation

## 2019-08-14 DIAGNOSIS — S81801A Unspecified open wound, right lower leg, initial encounter: Secondary | ICD-10-CM | POA: Diagnosis not present

## 2019-08-14 DIAGNOSIS — S51812A Laceration without foreign body of left forearm, initial encounter: Secondary | ICD-10-CM | POA: Insufficient documentation

## 2019-08-14 NOTE — Progress Notes (Addendum)
[name] ([id number]) Visit Report for 08/14/2019 Chief Complaint Document Details Patient Name: [name], [name] Date of Service: 08/14/2019 3:15 PM Medical Record Number: [id number] Patient Account Number: [id number] Date of Birth/Sex: 20-Apr-1920 (86 y.o. F) Treating RN: Cornell Barman Primary Care Provider: Ria Bush Other Clinician: Referring Provider: Ria Bush Treating Provider/Extender: Melburn Hake, Dailey Alberson Weeks in Treatment: 7 Information Obtained from: Patient Chief Complaint Skin tear on the left forearm and right lower leg. Electronic Signature(s) Signed: 08/14/2019 3:31:21 PM By: Worthy Keeler PA-C Entered By: Worthy Keeler on 08/14/2019 15:31:21 [name], [name] ([id number]) -------------------------------------------------------------------------------- HPI Details Patient Name: [name] Date of Service: 08/14/2019 3:15 PM Medical Record Number: [id number] Patient Account Number: [id number] Date of Birth/Sex: [birth year] (86 y.o. F) Treating RN: Cornell Barman Primary Care Provider: Ria Bush Other Clinician: Referring Provider: Ria Bush Treating Provider/Extender: Melburn Hake, Belkys Henault Weeks in Treatment: 7 History of Present Illness HPI Description: 12/15/2018 on evaluation today patient presents for initial evaluation here in our clinic concerning issues that she has been having at multiple locations with skin tears. She had a fall roughly 2 weeks ago where she injured her left upper arm and forearm at that time. Subsequently she ended up as well with a skin tear to her leg when her family members were trying to get her into the bed her skin is so fragile that as she was lifting her legs the skin on the back of the leg tore. That is on the right. Nonetheless they have been trying to manage these I did see her primary care provider earlier in the week who referred her to [name] for further evaluation and treatment. Currently there does not  appear to be any signs of active infection at this time which is good news at any site. No fevers, chills, nausea, vomiting, or diarrhea. She does have pain but this is mainly on the leg ulcer the arms are really not to painful at this point. The patient does have a history of dementia, hypertension, congestive heart failure, and chronic kidney disease stage III. 12/29/2018 on evaluation today patient appears to be doing better in regard to her skin tears on the forearm both are healing quite nicely one is close the other is slightly hyper granulated which is probably preventing complete epithelization. With regard to her right lower extremity unfortunately she is still having a lot of dressing material/alginate which is stuck to the wound bed. This seems to be a result of unfortunately having a different type of alginate than what we actually ordered for her. We had ordered silver cell as this was more nonstick unfortunately she received a different alginate which is breaking down and getting somewhat matted into the wound. In fact I would have to try to get some of this off today if at all possible. Other than that she seems to actually be healing nicely she has much less drainage than previously noted. 01/02/19 on evaluation today patient appears to be doing well with regard to her left forearm and right lower extremity ulcers. The left forearm appears to be completely healed which is great news the right leg is doing much better that there's still some eschar/dressing material stuck to the wound it appears to be healing underneath quite well. The Xeroform did much better for her. 01/09/2019 on evaluation today patient appears to be doing very well with regard to her right lower extremity ulcer. She has been tolerating the dressing changes without complication. Unfortunately she still has  a lot of the alginate dressing stuck to the wound bed that somewhat mad again. It looks like there may be some  healing underneath some of these areas but nonetheless I feel like we really need to try to remove that today. This was discussed with the patient's daughter as well. The good news is the patient is really not having a lot of pain. 01/16/2019 upon evaluation today patient appears to be doing well with regard to her wound. In fact this is measuring quite a bit smaller and overall I am very pleased with how things seem to be progressing. The patient seems to be doing well although she does have dementia her family member who is with her today is very pleased with how things seem to be progressing. 01/30/2019 on evaluation today patient appears to be doing excellent in regard to her ulcer. She has been tolerating the dressing changes without complication. Fortunately there is no signs of anything remaining open at this point which is excellent.  Readmission:06/26/2019 upon evaluation today patient presents for reevaluation here in the clinic concerning issues unfortunately that she has with new wounds to her left forearm, left upper arm, right lower extremity, and left lower extremity. She had 2 different falls which actually resulted in the skin tears as noted here. Fortunately there is no evidence of active infection. I previously have seen her for skin tears at that time as well. She responded extremely well with Xeroform at that point. Alginate did not do well at all for her in fact the alginate that the home health company used at that time got stuck into her wound on the leg and we had to have a fairly significant debridement to remove all this that definitely do not want to go that way again. I completely understand. Nonetheless I think she responded so well to the Xeroform at that time that would be my go to dressing of choice at this point as well in fact this point they have been using it already some of the wounds seem to be doing better. Her left forearm is actually the worst of all the  wounds. 07/02/2019 upon evaluation today patient appears to be doing better in regard to most of her original wounds that she had upon last evaluation. Unfortunately the left elbow is a new skin tear that appears to be at least a day old may be 2 but again the skin has not reattached to the point that I think we cannot try to stretch this out and see if we get it reattached. This is not a fresh wound does not right here in the office is not bleeding enough for that and I showed that to the patient's family member as well as he question whether or not getting her into the chair because the skin tear. Again I do not believe there is any way possible that is the case with the way its not actively bleeding at this time. Again I explained that even pulling the skin over there is no guarantee that we can be able to get this to completely reattached but we will give it our best. 07/10/2019 upon evaluation today patient actually appears to be doing much better and a lot of her wounds are actually improving quite significantly which is great news. There does not appear to be any evidence of active infection which is also great news. Overall I am extremely pleased with how she is doing. She does have a new skin tear however  which occurred sometime over the weekend according to her family member who is with her today. Nonetheless this is minimal working to keep an eye on this it appears to be pretty stable and we will see how it appears next week. 07/24/2019 upon evaluation today patient appears to be doing well with regard to her elbow which in fact appears to be completely healed. Her arm ulcer is showing hyper granulation I think getting to switch out the dressing as this is much too wet. She also apparently has a blister on her right leg which is new due to her having hit this likely though nobody seems to know when this occurred. Apparently the home health nurse was to contact us concerning this although I  have not heard from them up to this point. 08/14/2019 on evaluation today patient appears to be doing well in regard to her forearm ulcer which is measuring much better and does seem to be doing excellent currently. In regard to her leg where she had a hematoma that has opened and actually appears to be healing quite nicely. Fortunately there is no signs of active infection at this time. No fevers, chills, nausea, vomiting, or diarrhea. [name], [name] ([id number]) Electronic Signature(s) Signed: 08/14/2019 3:52:47 PM By: Worthy Keeler PA-C Entered By: Worthy Keeler on 08/14/2019 15:52:47 Schiff, [name] ([id number]) -------------------------------------------------------------------------------- Physical Exam Details Patient Name: [name] Date of Service: 08/14/2019 3:15 PM Medical Record Number: [id number] Patient Account Number: [id number] Date of Birth/Sex: 1920-12-07 (86 y.o. F) Treating RN: Cornell Barman Primary Care Provider: Ria Bush Other Clinician: Referring Provider: Ria Bush Treating Provider/Extender: STONE III, Jadarian Mckay Weeks in Treatment: 7 Constitutional Well-nourished and well-hydrated in no acute distress. Respiratory normal breathing without difficulty. Psychiatric this patient is able to make decisions and demonstrates good insight into disease process. Alert and Oriented x 3. pleasant and cooperative. Notes Upon inspection patient's wound bed actually showed signs of both locations of good granulation and epithelization there is no evidence of active infection overall very pleased with where things stand today. No fevers, chills, nausea, vomiting, or diarrhea. Electronic Signature(s) Signed: 08/14/2019 3:53:02 PM By: Worthy Keeler PA-C Entered By: Worthy Keeler on 08/14/2019 15:53:02 Tibbetts, [name] ([id number]) -------------------------------------------------------------------------------- Physician Orders Details Patient Name:  [name] Date of Service: 08/14/2019 3:15 PM Medical Record Number: [id number] Patient Account Number: [id number] Date of Birth/Sex: May 29, 1920 (86 y.o. F) Treating RN: Cornell Barman Primary Care Provider: Ria Bush Other Clinician: Referring Provider: Ria Bush Treating Provider/Extender: Melburn Hake, Khyrin Trevathan Weeks in Treatment: 7 Verbal / Phone Orders: No Diagnosis Coding ICD-10 Coding Code Description S51.802A Unspecified open wound of left forearm, initial encounter S81.801A Unspecified open wound, right lower leg, initial encounter F03.90 Unspecified dementia without behavioral disturbance I10 Essential (primary) hypertension I01.65 Chronic diastolic (congestive) heart failure N18.30 Chronic kidney disease, stage 3 unspecified Wound Cleansing Wound #5 Left Forearm o Clean wound with Normal Saline. o May Shower, gently pat wound dry prior to applying new dressing. Primary Wound Dressing Wound #5 Left Forearm o Xeroform Wound #9 Right,Lateral Lower Leg o Xeroform Secondary Dressing Wound #5 Left Forearm o ABD and Kerlix/Conform Wound #9 Right,Lateral Lower Leg o ABD and Kerlix/Conform Dressing Change Frequency Wound #5 Left Forearm o Change dressing every other day. Wound #9 Right,Lateral Lower Leg o Change dressing every other day. Follow-up Appointments Wound #5 Left Forearm o Return Appointment in 3 weeks. Wound #9 Right,Lateral Lower Leg o Return Appointment in 3 weeks. Edema Control o  Elevate legs to the level of the heart and pump ankles as often as possible o Other: - Toad Hop Wound #5 Left Forearm o [place] Visits - [place] Nurse may visit PRN to address patientos wound care needs. o FACE TO FACE ENCOUNTER: MEDICARE and MEDICAID PATIENTS: I certify that this patient is under my care and that I had a face-to-face encounter that meets the physician face-to-face encounter  requirements with this patient on this date. The ZUNAIRA, LAMY ([id number]) encounter with the patient was in whole or in part for the following MEDICAL CONDITION: (primary reason for [place]) MEDICAL NECESSITY: I certify, that based on my findings, NURSING services are a medically necessary home health service. HOME BOUND STATUS: I certify that my clinical findings support that this patient is homebound (i.e., Due to illness or injury, pt requires aid of supportive devices such as crutches, cane, wheelchairs, walkers, the use of special transportation or the assistance of another person to leave their place of residence. There is a normal inability to leave the home and doing so requires considerable and taxing effort. Other absences are for medical reasons / religious services and are infrequent or of short duration when for other reasons). o If current dressing causes regression in wound condition, may D/C ordered dressing product/s and apply Normal Saline Moist Dressing daily until next [place] / Other MD appointment. [place] of regression in wound condition at [phone]. o Please direct any NON-WOUND related issues/requests for orders to patient's Primary Care Physician Electronic Signature(s) Signed: 08/14/2019 4:27:08 PM By: Worthy Keeler PA-C Signed: 08/16/2019 6:25:29 PM By: Gretta Cool, BSN, RN, CWS, Kim RN, BSN Entered By: Gretta Cool, BSN, RN, CWS, Kim on 08/14/2019 15:39:17 Cleverly, [name] ([id number]) -------------------------------------------------------------------------------- Problem List Details Patient Name: [name] Date of Service: 08/14/2019 3:15 PM Medical Record Number: [id number] Patient Account Number: [id number] Date of Birth/Sex: 05/09/1920 (86 y.o. F) Treating RN: Cornell Barman Primary Care Provider: Ria Bush Other Clinician: Referring Provider: Ria Bush Treating Provider/Extender: Melburn Hake,  Lajoy Vanamburg Weeks in Treatment: 7 Active Problems ICD-10 Encounter Code Description Active Date MDM Diagnosis S51.802A Unspecified open wound of left forearm, initial encounter 06/26/2019 No Yes S81.801A Unspecified open wound, right lower leg, initial encounter 06/26/2019 No Yes F03.90 Unspecified dementia without behavioral disturbance 06/26/2019 No Yes I10 Essential (primary) hypertension 06/26/2019 No Yes A63.01 Chronic diastolic (congestive) heart failure 06/26/2019 No Yes N18.30 Chronic kidney disease, stage 3 unspecified 06/26/2019 No Yes Inactive Problems Resolved Problems ICD-10 Code Description Active Date Resolved Date S41.102A Unspecified open wound of left upper arm, initial encounter 06/26/2019 06/26/2019 S81.802A Unspecified open wound, left lower leg, initial encounter 06/26/2019 06/26/2019 Electronic Signature(s) Signed: 08/14/2019 3:30:54 PM By: Worthy Keeler PA-C Entered By: Worthy Keeler on 08/14/2019 15:30:54 Deblois, [name] ([id number]) -------------------------------------------------------------------------------- Progress Note Details Patient Name: [name] Date of Service: 08/14/2019 3:15 PM Medical Record Number: [id number] Patient Account Number: [id number] Date of Birth/Sex: July 24, 1920 (86 y.o. F) Treating RN: Cornell Barman Primary Care Provider: Ria Bush Other Clinician: Referring Provider: Ria Bush Treating Provider/Extender: Melburn Hake, Maryruth Apple Weeks in Treatment: 7 Subjective Chief Complaint Information obtained from Patient Skin tear on the left forearm and right lower leg. History of Present Illness (HPI) 12/15/2018 on evaluation today patient presents for initial evaluation here in our clinic concerning issues that she has been having at multiple locations with skin tears. She had a fall roughly 2 weeks ago where she  injured her left upper arm and forearm at that time. Subsequently she ended up as well with a skin tear to her leg when her family  members were trying to get her into the bed her skin is so fragile that as she was lifting her legs the skin on the back of the leg tore. That is on the right. Nonetheless they have been trying to manage these I did see her primary care provider earlier in the week who referred her to [name] for further evaluation and treatment. Currently there does not appear to be any signs of active infection at this time which is good news at any site. No fevers, chills, nausea, vomiting, or diarrhea. She does have pain but this is mainly on the leg ulcer the arms are really not to painful at this point. The patient does have a history of dementia, hypertension, congestive heart failure, and chronic kidney disease stage III. 12/29/2018 on evaluation today patient appears to be doing better in regard to her skin tears on the forearm both are healing quite nicely one is close the other is slightly hyper granulated which is probably preventing complete epithelization. With regard to her right lower extremity unfortunately she is still having a lot of dressing material/alginate which is stuck to the wound bed. This seems to be a result of unfortunately having a different type of alginate than what we actually ordered for her. We had ordered silver cell as this was more nonstick unfortunately she received a different alginate which is breaking down and getting somewhat matted into the wound. In fact I would have to try to get some of this off today if at all possible. Other than that she seems to actually be healing nicely she has much less drainage than previously noted. 01/02/19 on evaluation today patient appears to be doing well with regard to her left forearm and right lower extremity ulcers. The left forearm appears to be completely healed which is great news the right leg is doing much better that there's still some eschar/dressing material stuck to the wound it appears to be healing underneath quite well. The Xeroform  did much better for her. 01/09/2019 on evaluation today patient appears to be doing very well with regard to her right lower extremity ulcer. She has been tolerating the dressing changes without complication. Unfortunately she still has a lot of the alginate dressing stuck to the wound bed that somewhat mad again. It looks like there may be some healing underneath some of these areas but nonetheless I feel like we really need to try to remove that today. This was discussed with the patient's daughter as well. The good news is the patient is really not having a lot of pain. 01/16/2019 upon evaluation today patient appears to be doing well with regard to her wound. In fact this is measuring quite a bit smaller and overall I am very pleased with how things seem to be progressing. The patient seems to be doing well although she does have dementia her family member who is with her today is very pleased with how things seem to be progressing. 01/30/2019 on evaluation today patient appears to be doing excellent in regard to her ulcer. She has been tolerating the dressing changes without complication. Fortunately there is no signs of anything remaining open at this point which is excellent.  Readmission:06/26/2019 upon evaluation today patient presents for reevaluation here in the clinic concerning issues unfortunately that she has with new wounds to her  left forearm, left upper arm, right lower extremity, and left lower extremity. She had 2 different falls which actually resulted in the skin tears as noted here. Fortunately there is no evidence of active infection. I previously have seen her for skin tears at that time as well. She responded extremely well with Xeroform at that point. Alginate did not do well at all for her in fact the alginate that the home health company used at that time got stuck into her wound on the leg and we had to have a fairly significant debridement to remove all this that definitely  do not want to go that way again. I completely understand. Nonetheless I think she responded so well to the Xeroform at that time that would be my go to dressing of choice at this point as well in fact this point they have been using it already some of the wounds seem to be doing better. Her left forearm is actually the worst of all the wounds. 07/02/2019 upon evaluation today patient appears to be doing better in regard to most of her original wounds that she had upon last evaluation. Unfortunately the left elbow is a new skin tear that appears to be at least a day old may be 2 but again the skin has not reattached to the point that I think we cannot try to stretch this out and see if we get it reattached. This is not a fresh wound does not right here in the office is not bleeding enough for that and I showed that to the patient's family member as well as he question whether or not getting her into the chair because the skin tear. Again I do not believe there is any way possible that is the case with the way its not actively bleeding at this time. Again I explained that even pulling the skin over there is no guarantee that we can be able to get this to completely reattached but we will give it our best. 07/10/2019 upon evaluation today patient actually appears to be doing much better and a lot of her wounds are actually improving quite significantly which is great news. There does not appear to be any evidence of active infection which is also great news. Overall I am extremely pleased with how she is doing. She does have a new skin tear however which occurred sometime over the weekend according to her family member who is with her today. Nonetheless this is minimal working to keep an eye on this it appears to be pretty stable and we will see how it appears next week. 07/24/2019 upon evaluation today patient appears to be doing well with regard to her elbow which in fact appears to be completely healed.  Her arm ulcer is showing hyper granulation I think getting to switch out the dressing as this is much too wet. She also apparently has a blister on her right leg which is new due to her having hit this likely though nobody seems to know when this occurred. Apparently the home health nurse was [name], [name]. ([id number]) to contact us concerning this although I have not heard from them up to this point. 08/14/2019 on evaluation today patient appears to be doing well in regard to her forearm ulcer which is measuring much better and does seem to be doing excellent currently. In regard to her leg where she had a hematoma that has opened and actually appears to be healing quite nicely. Fortunately there is no signs  of active infection at this time. No fevers, chills, nausea, vomiting, or diarrhea. Objective Constitutional Well-nourished and well-hydrated in no acute distress. Vitals Time Taken: 3:10 PM, Height: 62 in, Weight: 113 lbs, BMI: 20.7, Temperature: 98.3 F, Pulse: 78 bpm, Respiratory Rate: 16 breaths/min, Blood Pressure: 120/64 mmHg. Respiratory normal breathing without difficulty. Psychiatric this patient is able to make decisions and demonstrates good insight into disease process. Alert and Oriented x 3. pleasant and cooperative. General Notes: Upon inspection patient's wound bed actually showed signs of both locations of good granulation and epithelization there is no evidence of active infection overall very pleased with where things stand today. No fevers, chills, nausea, vomiting, or diarrhea. Integumentary (Hair, Skin) Wound #5 status is Open. Original cause of wound was Trauma. The wound is located on the Left Forearm. The wound measures 0.4cm length x 0.5cm width x 0.1cm depth; 0.157cm^2 area and 0.016cm^3 volume. There is Fat Layer (Subcutaneous Tissue) Exposed exposed. There is a small amount of serous drainage noted. The wound margin is distinct with the outline attached to  the wound base. There is large (67-100%) hyper - granulation within the wound bed. There is no necrotic tissue within the wound bed. Wound #9 status is Open. Original cause of wound was Blister. The wound is located on the Right,Lateral Lower Leg. The wound measures 3.5cm length x 3cm width x 0.1cm depth; 8.247cm^2 area and 0.825cm^3 volume. Assessment Active Problems ICD-10 Unspecified open wound of left forearm, initial encounter Unspecified open wound, right lower leg, initial encounter Unspecified dementia without behavioral disturbance Essential (primary) hypertension Chronic diastolic (congestive) heart failure Chronic kidney disease, stage 3 unspecified Plan Wound Cleansing: Wound #5 Left Forearm: Clean wound with Normal Saline. May Shower, gently pat wound dry prior to applying new dressing. Primary Wound Dressing: Wound #5 Left Forearm: Xeroform Wound #9 Right,Lateral Lower Leg: Xeroform Secondary Dressing: Wound #5 Left Forearm: Cook, Traci W. ([id number]) ABD and Kerlix/Conform Wound #9 Right,Lateral Lower Leg: ABD and Kerlix/Conform Dressing Change Frequency: Wound #5 Left Forearm: Change dressing every other day. Wound #9 Right,Lateral Lower Leg: Change dressing every other day. Follow-up Appointments: Wound #5 Left Forearm: Return Appointment in 3 weeks. Wound #9 Right,Lateral Lower Leg: Return Appointment in 3 weeks. Edema Control: Elevate legs to the level of the heart and pump ankles as often as possible Other: - tubigrip F Home Health: Wound #5 Left Forearm: Continue Home Health Visits - [place] Nurse may visit PRN to address patient s wound care needs. FACE TO FACE ENCOUNTER: MEDICARE and MEDICAID PATIENTS: I certify that this patient is under my care and that I had a face-to-face encounter that meets the physician face-to-face encounter requirements with this patient on this date. The encounter with the patient was in whole or in part  for the following MEDICAL CONDITION: (primary reason for [place]) MEDICAL NECESSITY: I certify, that based on my findings, NURSING services are a medically necessary home health service. HOME BOUND STATUS: I certify that my clinical findings support that this patient is homebound (i.e., Due to illness or injury, pt requires aid of supportive devices such as crutches, cane, wheelchairs, walkers, the use of special transportation or the assistance of another person to leave their place of residence. There is a normal inability to leave the home and doing so requires considerable and taxing effort. Other absences are for medical reasons / religious services and are infrequent or of short duration when for other reasons). If current dressing causes regression in wound condition,  may D/C ordered dressing product/s and apply Normal Saline Moist Dressing daily until next [place] / Other MD appointment. [place] of regression in wound condition at [phone]. Please direct any NON-WOUND related issues/requests for orders to patient's Primary Care Physician 1. I would recommend currently that were going to go ahead and continue with Xeroform to both wound locations I think this would be a good option. 2. Also can recommend that we have ABD and roll gauze to secure in place along with stretch net to hold this without using any adhesive as that seemed to do best for the patient. 3. I am also going to suggest that we go ahead and continue with the monitoring for any signs of infection she seems to be doing quite well right now and for that reason I am going say that we hold off on any cultures or otherwise I do not think there is any evidence of infection right now. We will see patient back for reevaluation in 3 weeks here in the clinic. If anything worsens or changes patient will contact our office for additional recommendations. Electronic Signature(s) Signed:  08/14/2019 3:54:00 PM By: Worthy Keeler PA-C Entered By: Worthy Keeler on 08/14/2019 15:54:00 G[place]ay, [name] ([id number]) -------------------------------------------------------------------------------- SuperBill Details Patient Name: [name] Date of Service: 08/14/2019 Medical Record Number: [id number] Patient Account Number: [id number] Date of Birth/Sex: February 22, 1920 (86 y.o. F) Treating RN: Cornell Barman Primary Care Provider: Ria Bush Other Clinician: Referring Provider: Ria Bush Treating Provider/Extender: Melburn Hake, Nakai Yard Weeks in Treatment: 7 Diagnosis Coding ICD-10 Codes Code Description S51.802A Unspecified open wound of left forearm, initial encounter S81.801A Unspecified open wound, right lower leg, initial encounter F03.90 Unspecified dementia without behavioral disturbance I10 Essential (primary) hypertension B34.35 Chronic diastolic (congestive) heart failure N18.30 Chronic kidney disease, stage 3 unspecified Facility Procedures CPT4 Code: 68616837 Description: 99213 - WOUND CARE VISIT-LEV 3 EST PT Modifier: Quantity: 1 Physician Procedures CPT4 Code: 2902111 Description: 55208 - WC PHYS LEVEL 3 - EST PT Modifier: Quantity: 1 CPT4 Code: Description: ICD-10 Diagnosis Description S51.802A Unspecified open wound of left forearm, initial encounter S81.801A Unspecified open wound, right lower leg, initial encounter F03.90 Unspecified dementia without behavioral disturbance I10 Essential  (primary) hypertension Modifier: Quantity: Electronic Signature(s) Signed: 08/14/2019 3:54:18 PM By: Worthy Keeler PA-C Entered By: Worthy Keeler on 08/14/2019 15:54:18

## 2019-08-16 DIAGNOSIS — I130 Hypertensive heart and chronic kidney disease with heart failure and stage 1 through stage 4 chronic kidney disease, or unspecified chronic kidney disease: Secondary | ICD-10-CM | POA: Diagnosis not present

## 2019-08-16 DIAGNOSIS — E559 Vitamin D deficiency, unspecified: Secondary | ICD-10-CM | POA: Diagnosis not present

## 2019-08-16 DIAGNOSIS — I5032 Chronic diastolic (congestive) heart failure: Secondary | ICD-10-CM | POA: Diagnosis not present

## 2019-08-16 DIAGNOSIS — E039 Hypothyroidism, unspecified: Secondary | ICD-10-CM | POA: Diagnosis not present

## 2019-08-16 DIAGNOSIS — S81812D Laceration without foreign body, left lower leg, subsequent encounter: Secondary | ICD-10-CM | POA: Diagnosis not present

## 2019-08-16 DIAGNOSIS — S81811D Laceration without foreign body, right lower leg, subsequent encounter: Secondary | ICD-10-CM | POA: Diagnosis not present

## 2019-08-16 DIAGNOSIS — Z9181 History of falling: Secondary | ICD-10-CM | POA: Diagnosis not present

## 2019-08-16 DIAGNOSIS — Z86010 Personal history of colonic polyps: Secondary | ICD-10-CM | POA: Diagnosis not present

## 2019-08-16 DIAGNOSIS — Z85038 Personal history of other malignant neoplasm of large intestine: Secondary | ICD-10-CM | POA: Diagnosis not present

## 2019-08-16 DIAGNOSIS — N1832 Chronic kidney disease, stage 3b: Secondary | ICD-10-CM | POA: Diagnosis not present

## 2019-08-16 DIAGNOSIS — S41112D Laceration without foreign body of left upper arm, subsequent encounter: Secondary | ICD-10-CM | POA: Diagnosis not present

## 2019-08-16 DIAGNOSIS — E440 Moderate protein-calorie malnutrition: Secondary | ICD-10-CM | POA: Diagnosis not present

## 2019-08-17 NOTE — Progress Notes (Signed)
[name] ([id number]) Visit Report for 08/14/2019 Arrival Information Details Patient Name: [name], [name] Date of Service: 08/14/2019 3:15 PM Medical Record Number: [id number] Patient Account Number: [id number] Date of Birth/Sex: 28-Jan-1920 (86 y.o. F) Treating RN: Cornell Barman Primary Care Ersie Savino: Ria Bush Other Clinician: Referring Kamela Blansett: Ria Bush Treating Luqman Perrelli/Extender: Melburn Hake, HOYT Weeks in Treatment: 7 Visit Information History Since Last Visit Added or deleted any medications: No Patient Arrived: Wheel Chair Any new allergies or adverse reactions: No Arrival Time: 15:15 Had a fall or experienced change in No Accompanied By: son activities of daily living that may affect Transfer Assistance: None risk of falls: Patient Identification Verified: Yes Signs or symptoms of abuse/neglect since last visito No Secondary Verification Process Completed: Yes Hospitalized since last visit: No Implantable device outside of the clinic excluding No cellular tissue based products placed in the center since last visit: Has Dressing in Place as Prescribed: Yes Pain Present Now: No Electronic Signature(s) Signed: 08/14/2019 4:26:07 PM By: Lorine Bears RCP, RRT, CHT Entered By: Lorine Bears on 08/14/2019 15:16:27 [place], [name] ([id number]) -------------------------------------------------------------------------------- Clinic Level of Care Assessment Details Patient Name: [name] Date of Service: 08/14/2019 3:15 PM Medical Record Number: [id number] Patient Account Number: [id number] Date of Birth/Sex: 21-Nov-1920 (86 y.o. F) Treating RN: Cornell Barman Primary Care Tura Roller: Ria Bush Other Clinician: Referring Fortunata Betty: Ria Bush Treating Yaacov Koziol/Extender: Melburn Hake, HOYT Weeks in Treatment: 7 Clinic Level of Care Assessment Items TOOL 4 Quantity Score []  - Use when only an EandM is performed  on FOLLOW-UP visit 0 ASSESSMENTS - Nursing Assessment / Reassessment X - Reassessment of Co-morbidities (includes updates in patient status) 1 10 X- 1 5 Reassessment of Adherence to Treatment Plan ASSESSMENTS - Wound and Skin Assessment / Reassessment []  - Simple Wound Assessment / Reassessment - one wound 0 X- 2 5 Complex Wound Assessment / Reassessment - multiple wounds []  - 0 Dermatologic / Skin Assessment (not related to wound area) ASSESSMENTS - Focused Assessment []  - Circumferential Edema Measurements - multi extremities 0 []  - 0 Nutritional Assessment / Counseling / Intervention []  - 0 Lower Extremity Assessment (monofilament, tuning fork, pulses) []  - 0 Peripheral Arterial Disease Assessment (using hand held doppler) ASSESSMENTS - Ostomy and/or Continence Assessment and Care []  - Incontinence Assessment and Management 0 []  - 0 Ostomy Care Assessment and Management (repouching, etc.) PROCESS - Coordination of Care X - Simple Patient / Family Education for ongoing care 1 15 []  - 0 Complex (extensive) Patient / Family Education for ongoing care []  - 0 Staff obtains Programmer, systems, Records, Test Results / Process Orders []  - 0 Staff telephones HHA, Nursing Homes / Clarify orders / etc []  - 0 Routine Transfer to another Facility (non-emergent condition) []  - 0 Routine Hospital Admission (non-emergent condition) []  - 0 New Admissions / Biomedical engineer / Ordering NPWT, Apligraf, etc. []  - 0 Emergency Hospital Admission (emergent condition) X- 1 10 Simple Discharge Coordination []  - 0 Complex (extensive) Discharge Coordination PROCESS - Special Needs []  - Pediatric / Minor Patient Management 0 []  - 0 Isolation Patient Management []  - 0 Hearing / Language / Visual special needs []  - 0 Assessment of Community assistance (transportation, D/C planning, etc.) []  - 0 Additional assistance / Altered mentation []  - 0 Support Surface(s) Assessment (bed, cushion, seat,  etc.) INTERVENTIONS - Wound Cleansing / Measurement Sloniker, Maevis W. ([id number]) []  - 0 Simple Wound Cleansing - one wound X- 2 5 Complex Wound Cleansing - multiple wounds X-  1 5 Wound Imaging (photographs - any number of wounds) []  - 0 Wound Tracing (instead of photographs) []  - 0 Simple Wound Measurement - one wound X- 2 5 Complex Wound Measurement - multiple wounds INTERVENTIONS - Wound Dressings X - Small Wound Dressing one or multiple wounds 1 10 []  - 0 Medium Wound Dressing one or multiple wounds X- 1 20 Large Wound Dressing one or multiple wounds []  - 0 Application of Medications - topical []  - 0 Application of Medications - injection INTERVENTIONS - Miscellaneous []  - External ear exam 0 []  - 0 Specimen Collection (cultures, biopsies, blood, body fluids, etc.) []  - 0 Specimen(s) / Culture(s) sent or taken to Lab for analysis []  - 0 Patient Transfer (multiple staff / Civil Service fast streamer / Similar devices) []  - 0 Simple Staple / Suture removal (25 or less) []  - 0 Complex Staple / Suture removal (26 or more) []  - 0 Hypo / Hyperglycemic Management (close monitor of Blood Glucose) []  - 0 Ankle / Brachial Index (ABI) - do not check if billed separately X- 1 5 Vital Signs Has the patient been seen at the hospital within the last three years: Yes Total Score: 110 Level Of Care: New/Established - Level 3 Electronic Signature(s) Signed: 08/16/2019 6:25:29 PM By: Gretta Cool, BSN, RN, CWS, Kim RN, BSN Entered By: Gretta Cool, BSN, RN, CWS, Kim on 08/14/2019 15:36:28 [name] ([id number]) -------------------------------------------------------------------------------- Encounter Discharge Information Details Patient Name: [name] Date of Service: 08/14/2019 3:15 PM Medical Record Number: [id number] Patient Account Number: [id number] Date of Birth/Sex: 09-21-1920 (86 y.o. F) Treating RN: Cornell Barman Primary Care Livie Vanderhoof: Ria Bush Other Clinician: Referring  Sherrill Buikema: Ria Bush Treating Katrinka Herbison/Extender: Melburn Hake, HOYT Weeks in Treatment: 7 Encounter Discharge Information Items Discharge Condition: Stable Ambulatory Status: Ambulatory Discharge Destination: Home Transportation: Private Auto Accompanied By: son in law Schedule Follow-up Appointment: Yes Clinical Summary of Care: Electronic Signature(s) Signed: 08/16/2019 6:25:29 PM By: Gretta Cool, BSN, RN, CWS, Kim RN, BSN Entered By: Gretta Cool, BSN, RN, CWS, Kim on 08/14/2019 15:37:43 [place], [name] ([id number]) -------------------------------------------------------------------------------- Lower Extremity Assessment Details Patient Name: [name] Date of Service: 08/14/2019 3:15 PM Medical Record Number: [id number] Patient Account Number: [id number] Date of Birth/Sex: 17-Feb-1920 (86 y.o. F) Treating RN: Cornell Barman Primary Care Randeep Biondolillo: Ria Bush Other Clinician: Referring Atonya Templer: Ria Bush Treating Yolette Hastings/Extender: Melburn Hake, HOYT Weeks in Treatment: 7 Vascular Assessment Pulses: Dorsalis Pedis Palpable: [Left:Yes] Electronic Signature(s) Signed: 08/16/2019 6:25:29 PM By: Gretta Cool, BSN, RN, CWS, Kim RN, BSN Entered By: Gretta Cool, BSN, RN, CWS, Kim on 08/14/2019 15:33:04 [place], [name] ([id number]) -------------------------------------------------------------------------------- Multi Wound Chart Details Patient Name: [name] Date of Service: 08/14/2019 3:15 PM Medical Record Number: [id number] Patient Account Number: [id number] Date of Birth/Sex: [birth year] (86 y.o. F) Treating RN: Cornell Barman Primary Care Dayannara Pascal: Ria Bush Other Clinician: Referring Larenz Frasier: Ria Bush Treating Kyah Buesing/Extender: Melburn Hake, HOYT Weeks in Treatment: 7 Vital Signs Height(in): 62 Pulse(bpm): 78 Weight(lbs): 113 Blood Pressure(mmHg): 120/64 Body Mass Index(BMI): 21 Temperature(F): 98.3 Respiratory Rate(breaths/min): 16 Photos:  [N/A:N/A] Wound Location: Left Forearm Right, Lateral Lower Leg N/A Wounding Event: Trauma Blister N/A Primary Etiology: Trauma, Other To be determined N/A Comorbid History: Congestive Heart Failure Congestive Heart Failure N/A Date Acquired: 06/12/2019 08/07/2019 N/A Weeks of Treatment: 7 0 N/A Wound Status: Open Open N/A Measurements L x W x D (cm) 0.4x0.5x0.1 3.5x3x0.1 N/A Area (cm) : 0.157 8.247 N/A Volume (cm) : 0.016 0.825 N/A % Reduction in Area: 97.80% 0.00% N/A % Reduction in Volume:  97.70% 0.00% N/A Classification: Full Thickness Without Exposed Full Thickness Without Exposed N/A Support Structures Support Structures Exudate Amount: Small N/A N/A Exudate Type: Serous N/A N/A Exudate Color: amber N/A N/A Wound Margin: Distinct, outline attached N/A N/A Granulation Amount: Large (67-100%) N/A N/A Necrotic Amount: None Present (0%) N/A N/A Exposed Structures: Fat Layer (Subcutaneous Tissue) N/A N/A Exposed: Yes Treatment Notes Electronic Signature(s) Signed: 08/16/2019 6:25:29 PM By: Gretta Cool, BSN, RN, CWS, Kim RN, BSN Entered By: Gretta Cool, BSN, RN, CWS, Kim on 08/14/2019 15:33:25 [place], [name] ([id number]) -------------------------------------------------------------------------------- Multi-Disciplinary Care Plan Details Patient Name: [name] Date of Service: 08/14/2019 3:15 PM Medical Record Number: [id number] Patient Account Number: [id number] Date of Birth/Sex: 12/04/1920 (86 y.o. F) Treating RN: Cornell Barman Primary Care Geno Sydnor: Ria Bush Other Clinician: Referring Boneta Standre: Ria Bush Treating Burdell Peed/Extender: Melburn Hake, HOYT Weeks in Treatment: 7 Active Inactive Abuse / Safety / Falls / Self Care Management Nursing Diagnoses: Potential for falls Goals: Patient will remain injury free related to falls Date Initiated: 06/26/2019 Target Resolution Date: 09/29/2019 Goal Status: Active Interventions: Assess fall risk on admission and as  needed Notes: Orientation to the Wound Care Program Nursing Diagnoses: Knowledge deficit related to the wound healing center program Goals: Patient/caregiver will verbalize understanding of the [place] Program Date Initiated: 06/26/2019 Target Resolution Date: 09/29/2019 Goal Status: Active Interventions: Provide education on orientation to the wound center Notes: Wound/Skin Impairment Nursing Diagnoses: Impaired tissue integrity Goals: Ulcer/skin breakdown will heal within 14 weeks Date Initiated: 06/26/2019 Target Resolution Date: 09/29/2019 Goal Status: Active Interventions: Assess patient/caregiver ability to obtain necessary supplies Assess patient/caregiver ability to perform ulcer/skin care regimen upon admission and as needed Assess ulceration(s) every visit Notes: Electronic Signature(s) Signed: 08/16/2019 6:25:29 PM By: Gretta Cool, BSN, RN, CWS, Kim RN, BSN Entered By: Gretta Cool, BSN, RN, CWS, Kim on 08/14/2019 15:33:18 [place], [name] ([id number]) -------------------------------------------------------------------------------- Pain Assessment Details Patient Name: [name] Date of Service: 08/14/2019 3:15 PM Medical Record Number: [id number] Patient Account Number: [id number] Date of Birth/Sex: [birth year] (86 y.o. F) Treating RN: Cornell Barman Primary Care Kooper Chriswell: Ria Bush Other Clinician: Referring Merari Pion: Ria Bush Treating Lila Lufkin/Extender: Melburn Hake, HOYT Weeks in Treatment: 7 Active Problems Location of Pain Severity and Description of Pain Patient Has Paino No Site Locations Pain Management and Medication Current Pain Management: Electronic Signature(s) Signed: 08/14/2019 4:02:12 PM By: Sandre Kitty Signed: 08/16/2019 6:25:29 PM By: Gretta Cool, BSN, RN, CWS, Kim RN, BSN Entered By: Sandre Kitty on 08/14/2019 15:19:24 [name]  ([id number]) -------------------------------------------------------------------------------- Patient/Caregiver Education Details Patient Name: [name] Date of Service: 08/14/2019 3:15 PM Medical Record Number: [id number] Patient Account Number: [id number] Date of Birth/Gender: August 06, 1920 (86 y.o. F) Treating RN: Cornell Barman Primary Care Physician: Ria Bush Other Clinician: Referring Physician: Ria Bush Treating Physician/Extender: Sharalyn Ink in Treatment: 7 Education Assessment Education Provided To: Patient Education Topics Provided Wound/Skin Impairment: Handouts: Caring for Your Ulcer Methods: Demonstration, Explain/Verbal Responses: State content correctly Electronic Signature(s) Signed: 08/16/2019 6:25:29 PM By: Gretta Cool, BSN, RN, CWS, Kim RN, BSN Entered By: Gretta Cool, BSN, RN, CWS, Kim on 08/14/2019 15:36:46 [place], [name] ([id number]) -------------------------------------------------------------------------------- Wound Assessment Details Patient Name: [name] Date of Service: 08/14/2019 3:15 PM Medical Record Number: [id number] Patient Account Number: [id number] Date of Birth/Sex: 1920-06-23 (86 y.o. F) Treating RN: Cornell Barman Primary Care Teela Narducci: Ria Bush Other Clinician: Referring Sic[place] Zaragoza: Ria Bush Treating Shannia Jacuinde/Extender: STONE III, HOYT Weeks in Treatment: 7 Wound Status Wound Number: 5 Primary Etiology: Trauma, Other Wound Location:  Left Forearm Wound Status: Open Wounding Event: Trauma Comorbid History: Congestive Heart Failure Date Acquired: 06/12/2019 Weeks Of Treatment: 7 Clustered Wound: No Photos Wound Measurements Length: (cm) 0.4 Width: (cm) 0.5 Depth: (cm) 0.1 Area: (cm) 0.157 Volume: (cm) 0.016 % Reduction in Area: 97.8% % Reduction in Volume: 97.7% Wound Description Classification: Full Thickness Without Exposed Support Structu Wound Margin: Distinct, outline  attached Exudate Amount: Small Exudate Type: Serous Exudate Color: amber res Foul Odor After Cleansing: No Slough/Fibrino No Wound Bed Granulation Amount: Large (67-100%) Exposed Structure Granulation Quality: Hyper-granulation Fat Layer (Subcutaneous Tissue) Exposed: Yes Necrotic Amount: None Present (0%) Treatment Notes Wound #5 (Left Forearm) Notes xeroform gauze, conform, Stretch net Electronic Signature(s) Signed: 08/14/2019 4:02:12 PM By: Sandre Kitty Signed: 08/16/2019 6:25:29 PM By: Gretta Cool, BSN, RN, CWS, Kim RN, BSN Entered By: Sandre Kitty on 08/14/2019 15:24:44 [place], [name] ([id number]) [name] ([id number]) -------------------------------------------------------------------------------- Wound Assessment Details Patient Name: [name] Date of Service: 08/14/2019 3:15 PM Medical Record Number: [id number] Patient Account Number: [id number] Date of Birth/Sex: [birth year] (86 y.o. F) Treating RN: Cornell Barman Primary Care Josalyn Dettmann: Ria Bush Other Clinician: Referring Arabella [name]: Ria Bush Treating Briann Sarchet/Extender: STONE III, HOYT Weeks in Treatment: 7 Wound Status Wound Number: 9 Primary Etiology: To be determined Wound Location: Right, Lateral Lower Leg Wound Status: Open Wounding Event: Blister Comorbid History: Congestive Heart Failure Date Acquired: 08/07/2019 Weeks Of Treatment: 0 Clustered Wound: No Photos Wound Measurements Length: (cm) 3.5 Width: (cm) 3 Depth: (cm) 0.1 Area: (cm) 8.247 Volume: (cm) 0.825 % Reduction in Area: 0% % Reduction in Volume: 0% Wound Description Classification: Full Thickness Without Exposed Support Structu res Treatment Notes Wound #9 (Right, Lateral Lower Leg) Notes xeroform gauze, conform, Stretch net Electronic Signature(s) Signed: 08/14/2019 4:02:12 PM By: Sandre Kitty Signed: 08/16/2019 6:25:29 PM By: Gretta Cool, BSN, RN, CWS, Kim RN, BSN Entered By: Sandre Kitty on  08/14/2019 15:25:08 [place], [name] ([id number]) -------------------------------------------------------------------------------- Vitals Details Patient Name: [name] Date of Service: 08/14/2019 3:15 PM Medical Record Number: [id number] Patient Account Number: [id number] Date of Birth/Sex: [birth year] (86 y.o. F) Treating RN: Cornell Barman Primary Care Ameen Mostafa: Ria Bush Other Clinician: Referring Skylie Hiott: Ria Bush Treating Zemira Zehring/Extender: Melburn Hake, HOYT Weeks in Treatment: 7 Vital Signs Time Taken: 15:10 Temperature (F): 98.3 Height (in): 62 Pulse (bpm): 78 Weight (lbs): 113 Respiratory Rate (breaths/min): 16 Body Mass Index (BMI): 20.7 Blood Pressure (mmHg): 120/64 Reference Range: 80 - 120 mg / dl Electronic Signature(s) Signed: 08/14/2019 4:26:07 PM By: Lorine Bears RCP, RRT, CHT Entered By: Lorine Bears on 08/14/2019 15:16:51

## 2019-08-20 DIAGNOSIS — S41112D Laceration without foreign body of left upper arm, subsequent encounter: Secondary | ICD-10-CM | POA: Diagnosis not present

## 2019-08-20 DIAGNOSIS — Z86010 Personal history of colonic polyps: Secondary | ICD-10-CM | POA: Diagnosis not present

## 2019-08-20 DIAGNOSIS — E039 Hypothyroidism, unspecified: Secondary | ICD-10-CM | POA: Diagnosis not present

## 2019-08-20 DIAGNOSIS — I130 Hypertensive heart and chronic kidney disease with heart failure and stage 1 through stage 4 chronic kidney disease, or unspecified chronic kidney disease: Secondary | ICD-10-CM | POA: Diagnosis not present

## 2019-08-20 DIAGNOSIS — Z9181 History of falling: Secondary | ICD-10-CM | POA: Diagnosis not present

## 2019-08-20 DIAGNOSIS — I5032 Chronic diastolic (congestive) heart failure: Secondary | ICD-10-CM | POA: Diagnosis not present

## 2019-08-20 DIAGNOSIS — S81811D Laceration without foreign body, right lower leg, subsequent encounter: Secondary | ICD-10-CM | POA: Diagnosis not present

## 2019-08-20 DIAGNOSIS — S81812D Laceration without foreign body, left lower leg, subsequent encounter: Secondary | ICD-10-CM | POA: Diagnosis not present

## 2019-08-20 DIAGNOSIS — N1832 Chronic kidney disease, stage 3b: Secondary | ICD-10-CM | POA: Diagnosis not present

## 2019-08-20 DIAGNOSIS — E440 Moderate protein-calorie malnutrition: Secondary | ICD-10-CM | POA: Diagnosis not present

## 2019-08-20 DIAGNOSIS — E559 Vitamin D deficiency, unspecified: Secondary | ICD-10-CM | POA: Diagnosis not present

## 2019-08-20 DIAGNOSIS — Z85038 Personal history of other malignant neoplasm of large intestine: Secondary | ICD-10-CM | POA: Diagnosis not present

## 2019-08-23 ENCOUNTER — Telehealth: Payer: Self-pay

## 2019-08-23 NOTE — Telephone Encounter (Signed)
Agree with extension.

## 2019-08-23 NOTE — Telephone Encounter (Signed)
Spoke with Amy to give verbal orders to extend [facility] for 4 weeks.  She states there has been an update since she called in. She states her supervisor reviewed Ms. Nakajima pictures and her arm is healed and her left leg area is almost healed so they don't think Ms. calderone insurance will cover the extension.  Amy states so for right now she is going to close out this referral. She will go back out and look at those areas again next week and if she feels HH to still needed she will call us back or the wound center back for another referral. FYI to Dr. Danise Mina.

## 2019-08-23 NOTE — Telephone Encounter (Signed)
Amy left a message on Triage Line asking to extend her [place] 4 weeks. States the patient is doing better. She has been working on a hematoma that is improving. Can leave verbal orders at [phone].

## 2019-08-24 NOTE — Telephone Encounter (Signed)
Noted! Thank you

## 2019-08-27 ENCOUNTER — Other Ambulatory Visit: Payer: Self-pay

## 2019-08-27 ENCOUNTER — Ambulatory Visit (INDEPENDENT_AMBULATORY_CARE_PROVIDER_SITE_OTHER): Payer: Medicare Other | Admitting: Family Medicine

## 2019-08-27 ENCOUNTER — Encounter: Payer: Self-pay | Admitting: Family Medicine

## 2019-08-27 VITALS — BP 134/70 | HR 94 | Temp 97.5°F | Ht 63.5 in | Wt 112.2 lb

## 2019-08-27 DIAGNOSIS — H6123 Impacted cerumen, bilateral: Secondary | ICD-10-CM | POA: Diagnosis not present

## 2019-08-27 DIAGNOSIS — F0390 Unspecified dementia without behavioral disturbance: Secondary | ICD-10-CM

## 2019-08-27 DIAGNOSIS — R531 Weakness: Secondary | ICD-10-CM

## 2019-08-27 DIAGNOSIS — I5032 Chronic diastolic (congestive) heart failure: Secondary | ICD-10-CM | POA: Diagnosis not present

## 2019-08-27 DIAGNOSIS — S81801D Unspecified open wound, right lower leg, subsequent encounter: Secondary | ICD-10-CM

## 2019-08-27 DIAGNOSIS — I10 Essential (primary) hypertension: Secondary | ICD-10-CM | POA: Diagnosis not present

## 2019-08-27 DIAGNOSIS — S81809D Unspecified open wound, unspecified lower leg, subsequent encounter: Secondary | ICD-10-CM

## 2019-08-27 DIAGNOSIS — R296 Repeated falls: Secondary | ICD-10-CM

## 2019-08-27 DIAGNOSIS — E46 Unspecified protein-calorie malnutrition: Secondary | ICD-10-CM

## 2019-08-27 NOTE — Assessment & Plan Note (Addendum)
Chronic, progressive. Stable period. Will refer to palliative care to establish care. Spoke with daughter Bonnita Nasuti about this.

## 2019-08-27 NOTE — Assessment & Plan Note (Addendum)
Seems euvolemic today.  Managed with PRN lasix 10mg  daily

## 2019-08-27 NOTE — Addendum Note (Signed)
Addended by: Ria Bush on: 08/27/2019 04:03 PM   Modules accepted: Level of Service

## 2019-08-27 NOTE — Assessment & Plan Note (Signed)
L leg wound has healed. Continues R leg bandage with compression stocking, [name] says wound healing great.

## 2019-08-27 NOTE — Assessment & Plan Note (Signed)
Chronic, stable period on amlodipine - continue.  °

## 2019-08-27 NOTE — Assessment & Plan Note (Signed)
Stable period.  HH completing care period. Will transition to palliative care - referral placed today.

## 2019-08-27 NOTE — Patient Instructions (Signed)
Good to see you today Ear irrigation and wax removal performed today.  Return as needed or in 4 months for wellness visit.

## 2019-08-27 NOTE — Assessment & Plan Note (Signed)
No recent fall.  Encouraged regular supervision and walker use to help prevent recurrent fall.

## 2019-08-27 NOTE — Assessment & Plan Note (Signed)
Weight loss has stabilized. Continue nutritional support through protein supplements.

## 2019-08-27 NOTE — Assessment & Plan Note (Addendum)
Bilateral irrigation performed then cerumen disimpaction performed- significant wax removed bilaterally. Pt tolerated well.

## 2019-08-27 NOTE — Progress Notes (Addendum)
This visit was conducted in person.  BP 134/70 (BP Location: Right Arm, Patient Position: Sitting, Cuff Size: Normal)   Pulse 94   Temp (!) 97.5 F (36.4 C) (Temporal)   Ht 5' 3.5" (1.613 m)   Wt 112 lb 3 oz (50.9 kg)   SpO2 97%   BMI 19.56 kg/m    CC: 3 mo f/u visit  Subjective:    Patient ID: [name], female    DOB: December 19, 1920, 86 y.o.   MRN: [id number]  HPI: [name] is a 86 y.o. female presenting on 08/27/2019 for Follow-up (Here to check left arm, right leg and bilateral feet. Pt accompanied by daughter, Bonnita Nasuti- temp 97.7.) and Nasal Congestion (Wants to discuss taking [facility]s [facility] for runny nose. )   Regularly followed by wound clinic for large leg wounds after fall at home (05/2019) these have healed well.   To complete HH wound care as well.  [facility] palliative care to restart   Weight loss has stabilized. Appetite stays steady.  No more falls since last visit. Regularly uses walker.  HTN - only on amlodipine 5mg  daily and lasix 10mg  PRN.   No diarrhea or constipation.  No urinary incontinence.      Relevant past medical, surgical, family and social history reviewed and updated as indicated. Interim medical history since our last visit reviewed. Allergies and medications reviewed and updated. Outpatient Medications Prior to Visit  Medication Sig Dispense Refill  . acetaminophen (TYLENOL) 325 MG tablet Take 2 tablets (650 mg total) by mouth every 6 (six) hours as needed for mild pain or moderate pain (or Fever >/= 101). 80 tablet 0  . amLODipine (NORVASC) 5 MG tablet Take 1 tablet (5 mg total) by mouth daily. For blood pressure    . Cholecalciferol (VITAMIN D3) 1000 UNITS CAPS Take by mouth 2 (two) times daily.      . Cyanocobalamin (VITAMIN B-12) 500 MCG TABS Take 500 mcg by mouth daily.    . ferrous sulfate 325 (65 FE) MG tablet Take 1 tablet (325 mg total) by mouth every Monday, Wednesday, and Friday.    . furosemide (LASIX) 20 MG tablet  Take 0.5 tablets (10 mg total) by mouth daily as needed for fluid (leg swelling).    Marland Kitchen levothyroxine (SYNTHROID) 25 MCG tablet TAKE 1 TABLET BY MOUTH DAILY BEFORE BREAKFAST for thyroid.    . Multiple Vitamins-Minerals (EYE VITAMINS & MINERALS PO) Take 1 tablet by mouth daily. [facility]    . omeprazole (PRILOSEC OTC) 20 MG tablet Take 1 tablet (20 mg total) by mouth daily as needed (reflux/heartburn).     No facility-administered medications prior to visit.     Per HPI unless specifically indicated in ROS section below Review of Systems Objective:  BP 134/70 (BP Location: Right Arm, Patient Position: Sitting, Cuff Size: Normal)   Pulse 94   Temp (!) 97.5 F (36.4 C) (Temporal)   Ht 5' 3.5" (1.613 m)   Wt 112 lb 3 oz (50.9 kg)   SpO2 97%   BMI 19.56 kg/m   Wt Readings from Last 3 Encounters:  08/27/19 112 lb 3 oz (50.9 kg)  06/19/19 113 lb 3 oz (51.3 kg)  12/26/18 123 lb (55.8 kg)      Physical Exam Vitals and nursing note reviewed.  Constitutional:      Appearance: Normal appearance. She is not ill-appearing.     Comments: Ambulates with walker  HENT:     Right Ear: Decreased hearing  noted. There is impacted cerumen.     Left Ear: Decreased hearing noted. There is impacted cerumen.     Ears:     Comments: Irrigation performed then cerumen removed with plastic curette and  Cardiovascular:     Rate and Rhythm: Normal rate and regular rhythm.     Pulses: Normal pulses.     Heart sounds: Normal heart sounds. No murmur heard.   Pulmonary:     Effort: Pulmonary effort is normal. No respiratory distress.     Breath sounds: Normal breath sounds. No wheezing, rhonchi or rales.     Comments: LLL crackles Musculoskeletal:        General: Normal range of motion.     Right lower leg: No edema.     Left lower leg: No edema.  Skin:    General: Skin is warm and dry.     Comments: L arm and RLE wrap present  Neurological:     Mental Status: She is alert.  Psychiatric:         Mood and Affect: Mood normal.        Behavior: Behavior normal.       Results for orders placed or performed during the hospital encounter of 06/14/19  Comprehensive metabolic panel  Result Value Ref Range   Sodium 140 135 - 145 mmol/L   Potassium 3.7 3.5 - 5.1 mmol/L   Chloride 106 98 - 111 mmol/L   CO2 25 22 - 32 mmol/L   Glucose, Bld 101 (H) 70 - 99 mg/dL   BUN 35 (H) 8 - 23 mg/dL   Creatinine, Ser 1.63 (H) 0.44 - 1.00 mg/dL   Calcium 9.3 8.9 - 10.3 mg/dL   Total Protein 5.7 (L) 6.5 - 8.1 g/dL   Albumin 3.0 (L) 3.5 - 5.0 g/dL   AST 15 15 - 41 U/L   ALT 15 0 - 44 U/L   Alkaline Phosphatase 44 38 - 126 U/L   Total Bilirubin 1.3 (H) 0.3 - 1.2 mg/dL   GFR calc non Af Amer 26 (L) >60 mL/min   GFR calc Af Amer 31 (L) >60 mL/min   Anion gap 9 5 - 15  CBC  Result Value Ref Range   WBC 10.8 (H) 4.0 - 10.5 K/uL   RBC 3.73 (L) 3.87 - 5.11 MIL/uL   Hemoglobin 11.7 (L) 12.0 - 15.0 g/dL   HCT 36.9 36 - 46 %   MCV 98.9 80.0 - 100.0 fL   MCH 31.4 26.0 - 34.0 pg   MCHC 31.7 30.0 - 36.0 g/dL   RDW 12.6 11.5 - 15.5 %   Platelets PLATELET CLUMPS NOTED ON SMEAR, UNABLE TO ESTIMATE 150 - 400 K/uL   nRBC 0.0 0.0 - 0.2 %   Assessment & Plan:  This visit occurred during the SARS-CoV-2 public health emergency.  Safety protocols were in place, including screening questions prior to the visit, additional usage of staff PPE, and extensive cleaning of exam room while observing appropriate contact time as indicated for disinfecting solutions.   Problem List Items Addressed This Visit    Senile dementia ([place])    Stable period.  HH completing care period. Will transition to palliative care - referral placed today.       Relevant Orders   Amb Referral to Palliative Care   Recurrent falls    No recent fall.  Encouraged regular supervision and walker use to help prevent recurrent fall.       Protein-calorie malnutrition ([place])  Weight loss has stabilized. Continue nutritional support through  protein supplements.       Open leg wound    L leg wound has healed. Continues R leg bandage with compression stocking, Bonnita Nasuti says wound healing great.       Hearing loss due to cerumen impaction    Bilateral irrigation performed then cerumen disimpaction performed- significant wax removed bilaterally. Pt tolerated well.        General weakness - Primary    Chronic, progressive. Stable period. Will refer to palliative care to establish care. Spoke with daughter Bonnita Nasuti about this.       Relevant Orders   Amb Referral to Palliative Care   Essential hypertension    Chronic, stable period on amlodipine - continue.       Chronic diastolic CHF (congestive heart failure) (HCC)    Seems euvolemic today.  Managed with PRN lasix 10mg  daily          No orders of the defined types were placed in this encounter.  Orders Placed This Encounter  Procedures  . Amb Referral to Palliative Care    Referral Priority:   Routine    Referral Type:   Consultation    Number of Visits Requested:   1    Patient Instructions  Good to see you today Ear irrigation and wax removal performed today.  Return as needed or in 4 months for wellness visit.    Follow up plan: Return in about 4 months (around 12/27/2019) for annual exam, prior fasting for blood work.  Ria Bush, MD

## 2019-09-04 ENCOUNTER — Ambulatory Visit: Payer: Medicare Other | Admitting: Physician Assistant

## 2019-09-10 ENCOUNTER — Other Ambulatory Visit: Payer: Self-pay

## 2019-09-10 ENCOUNTER — Encounter: Payer: Medicare Other | Attending: Physician Assistant | Admitting: Physician Assistant

## 2019-09-10 DIAGNOSIS — I130 Hypertensive heart and chronic kidney disease with heart failure and stage 1 through stage 4 chronic kidney disease, or unspecified chronic kidney disease: Secondary | ICD-10-CM | POA: Insufficient documentation

## 2019-09-10 DIAGNOSIS — L97909 Non-pressure chronic ulcer of unspecified part of unspecified lower leg with unspecified severity: Secondary | ICD-10-CM | POA: Diagnosis not present

## 2019-09-10 DIAGNOSIS — F0390 Unspecified dementia without behavioral disturbance: Secondary | ICD-10-CM | POA: Diagnosis not present

## 2019-09-10 DIAGNOSIS — S80821A Blister (nonthermal), right lower leg, initial encounter: Secondary | ICD-10-CM | POA: Insufficient documentation

## 2019-09-10 DIAGNOSIS — X58XXXA Exposure to other specified factors, initial encounter: Secondary | ICD-10-CM | POA: Insufficient documentation

## 2019-09-10 DIAGNOSIS — S51812A Laceration without foreign body of left forearm, initial encounter: Secondary | ICD-10-CM | POA: Diagnosis not present

## 2019-09-10 DIAGNOSIS — I5032 Chronic diastolic (congestive) heart failure: Secondary | ICD-10-CM | POA: Insufficient documentation

## 2019-09-10 DIAGNOSIS — N183 Chronic kidney disease, stage 3 unspecified: Secondary | ICD-10-CM | POA: Diagnosis not present

## 2019-09-10 DIAGNOSIS — S81801A Unspecified open wound, right lower leg, initial encounter: Secondary | ICD-10-CM | POA: Diagnosis not present

## 2019-09-10 DIAGNOSIS — S51802A Unspecified open wound of left forearm, initial encounter: Secondary | ICD-10-CM | POA: Diagnosis not present

## 2019-09-10 NOTE — Progress Notes (Addendum)
TASMIA, BLUMER ([id number]) Visit Report for 09/10/2019 Arrival Information Details Patient Name: [name], [name] Date of Service: 09/10/2019 2:30 PM Medical Record Number: [id number] Patient Account Number: [id number] Date of Birth/Sex: July 28, 1920 (86 y.o. F) Treating RN: Grover Canavan Primary Care Raul Torrance: Ria Bush Other Clinician: Referring Cutberto Winfree: Ria Bush Treating Latera Mclin/Extender: Melburn Hake, HOYT Weeks in Treatment: 10 Visit Information History Since Last Visit Added or deleted any medications: No Patient Arrived: Wheel Chair Had a fall or experienced change in No Arrival Time: 14:44 activities of daily living that may affect Accompanied By: son risk of falls: Transfer Assistance: Manual Hospitalized since last visit: No Pain Present Now: No Electronic Signature(s) Signed: 09/10/2019 4:17:57 PM By: Grover Canavan Entered By: Grover Canavan on 09/10/2019 14:45:13 Dinino, [name] ([id number]) -------------------------------------------------------------------------------- Clinic Level of Care Assessment Details Patient Name: [name] Date of Service: 09/10/2019 2:30 PM Medical Record Number: [id number] Patient Account Number: [id number] Date of Birth/Sex: 19-Jul-1920 (86 y.o. F) Treating RN: Army Melia Primary Care Nollie Shiflett: Ria Bush Other Clinician: Referring Girtrude Enslin: Ria Bush Treating Keyondra Lagrand/Extender: Melburn Hake, HOYT Weeks in Treatment: 10 Clinic Level of Care Assessment Items TOOL 4 Quantity Score []  - Use when only an EandM is performed on FOLLOW-UP visit 0 ASSESSMENTS - Nursing Assessment / Reassessment X - Reassessment of Co-morbidities (includes updates in patient status) 1 10 X- 1 5 Reassessment of Adherence to Treatment Plan ASSESSMENTS - Wound and Skin Assessment / Reassessment []  - Simple Wound Assessment / Reassessment - one wound 0 []  - 0 Complex Wound Assessment / Reassessment - multiple  wounds []  - 0 Dermatologic / Skin Assessment (not related to wound area) ASSESSMENTS - Focused Assessment X - Circumferential Edema Measurements - multi extremities 1 5 []  - 0 Nutritional Assessment / Counseling / Intervention X- 1 5 Lower Extremity Assessment (monofilament, tuning fork, pulses) []  - 0 Peripheral Arterial Disease Assessment (using hand held doppler) ASSESSMENTS - Ostomy and/or Continence Assessment and Care []  - Incontinence Assessment and Management 0 []  - 0 Ostomy Care Assessment and Management (repouching, etc.) PROCESS - Coordination of Care X - Simple Patient / Family Education for ongoing care 1 15 []  - 0 Complex (extensive) Patient / Family Education for ongoing care X- 1 10 Staff obtains Programmer, systems, Records, Test Results / Process Orders []  - 0 Staff telephones HHA, Nursing Homes / Clarify orders / etc []  - 0 Routine Transfer to another Facility (non-emergent condition) []  - 0 Routine Hospital Admission (non-emergent condition) []  - 0 New Admissions / Biomedical engineer / Ordering NPWT, Apligraf, etc. []  - 0 Emergency Hospital Admission (emergent condition) X- 1 10 Simple Discharge Coordination []  - 0 Complex (extensive) Discharge Coordination PROCESS - Special Needs []  - Pediatric / Minor Patient Management 0 []  - 0 Isolation Patient Management []  - 0 Hearing / Language / Visual special needs []  - 0 Assessment of Community assistance (transportation, D/C planning, etc.) []  - 0 Additional assistance / Altered mentation []  - 0 Support Surface(s) Assessment (bed, cushion, seat, etc.) INTERVENTIONS - Wound Cleansing / Measurement Hufstetler, Angelize W. ([id number]) []  - 0 Simple Wound Cleansing - one wound X- 2 5 Complex Wound Cleansing - multiple wounds X- 1 5 Wound Imaging (photographs - any number of wounds) []  - 0 Wound Tracing (instead of photographs) []  - 0 Simple Wound Measurement - one wound X- 2 5 Complex Wound Measurement -  multiple wounds INTERVENTIONS - Wound Dressings []  - Small Wound Dressing one or multiple wounds 0 []  - 0 Medium Wound  Dressing one or multiple wounds []  - 0 Large Wound Dressing one or multiple wounds []  - 0 Application of Medications - topical []  - 0 Application of Medications - injection INTERVENTIONS - Miscellaneous []  - External ear exam 0 []  - 0 Specimen Collection (cultures, biopsies, blood, body fluids, etc.) []  - 0 Specimen(s) / Culture(s) sent or taken to Lab for analysis []  - 0 Patient Transfer (multiple staff / Civil Service fast streamer / Similar devices) []  - 0 Simple Staple / Suture removal (25 or less) []  - 0 Complex Staple / Suture removal (26 or more) []  - 0 Hypo / Hyperglycemic Management (close monitor of Blood Glucose) []  - 0 Ankle / Brachial Index (ABI) - do not check if billed separately X- 1 5 Vital Signs Has the patient been seen at the hospital within the last three years: Yes Total Score: 90 Level Of Care: New/Established - Level 3 Electronic Signature(s) Signed: 09/10/2019 3:21:14 PM By: Army Melia Entered By: Army Melia on 09/10/2019 14:54:50 Vanoverbeke, [name] ([id number]) -------------------------------------------------------------------------------- Encounter Discharge Information Details Patient Name: [name] Date of Service: 09/10/2019 2:30 PM Medical Record Number: [id number] Patient Account Number: [id number] Date of Birth/Sex: 01/24/1921 (86 y.o. F) Treating RN: Army Melia Primary Care Jovani Colquhoun: Ria Bush Other Clinician: Referring Rulon Abdalla: Ria Bush Treating Flornce Record/Extender: Melburn Hake, HOYT Weeks in Treatment: 10 Encounter Discharge Information Items Discharge Condition: Stable Ambulatory Status: Wheelchair Discharge Destination: Home Transportation: Private Auto Accompanied By: family Schedule Follow-up Appointment: Yes Clinical Summary of Care: Electronic Signature(s) Signed: 09/10/2019 3:21:14 PM By:  Army Melia Entered By: Army Melia on 09/10/2019 14:55:22 Zumbro, [name] ([id number]) -------------------------------------------------------------------------------- Lower Extremity Assessment Details Patient Name: [name] Date of Service: 09/10/2019 2:30 PM Medical Record Number: [id number] Patient Account Number: [id number] Date of Birth/Sex: 07-15-1920 (86 y.o. F) Treating RN: Grover Canavan Primary Care Falicity Sheets: Ria Bush Other Clinician: Referring Dayan Kreis: Ria Bush Treating Kristeena Meineke/Extender: STONE III, HOYT Weeks in Treatment: 10 Edema Assessment Assessed: [Left: Yes] [Right: Yes] Edema: [Left: No] [Right: No] Electronic Signature(s) Signed: 09/10/2019 4:17:57 PM By: Grover Canavan Entered By: Grover Canavan on 09/10/2019 14:48:24 Butters, [name] ([id number]) -------------------------------------------------------------------------------- Multi Wound Chart Details Patient Name: [name] Date of Service: 09/10/2019 2:30 PM Medical Record Number: [id number] Patient Account Number: [id number] Date of Birth/Sex: [birth year] (86 y.o. F) Treating RN: Army Melia Primary Care Decorian Schuenemann: Ria Bush Other Clinician: Referring Tamie Minteer: Ria Bush Treating Quinley Nesler/Extender: Melburn Hake, HOYT Weeks in Treatment: 10 Vital Signs Height(in): 62 Pulse(bpm): 80 Weight(lbs): 113 Blood Pressure(mmHg): 132/60 Body Mass Index(BMI): 21 Temperature(F): 98 Respiratory Rate(breaths/min): 14 Photos: [5:No Photos] [9:No Photos] [N/A:N/A] Wound Location: [5:Left Forearm] [9:Right, Lateral Lower Leg] [N/A:N/A] Wounding Event: [5:Trauma] [9:Blister] [N/A:N/A] Primary Etiology: [5:Trauma, Other] [9:Trauma, Other] [N/A:N/A] Date Acquired: [5:06/12/2019] [9:08/07/2019] [N/A:N/A] Weeks of Treatment: [5:10] [9:3] [N/A:N/A] Wound Status: [5:Healed - Epithelialized] [9:Healed - Epithelialized] [N/A:N/A] Measurements L x W x D (cm) [5:0x0x0]  [9:0x0x0] [N/A:N/A] Area (cm) : [5:0] [9:0] [N/A:N/A] Volume (cm) : [5:0] [9:0] [N/A:N/A] % Reduction in Area: [5:100.00%] [9:100.00%] [N/A:N/A] % Reduction in Volume: [5:100.00%] [9:100.00%] [N/A:N/A] Classification: [5:Full Thickness Without Exposed Support Structures] [9:Full Thickness Without Exposed Support Structures] [N/A:N/A] Treatment Notes Electronic Signature(s) Signed: 09/10/2019 3:21:14 PM By: Army Melia Entered By: Army Melia on 09/10/2019 14:51:48 Sherrod, [name] ([id number]) -------------------------------------------------------------------------------- [place] Details Patient Name: [name] Date of Service: 09/10/2019 2:30 PM Medical Record Number: [id number] Patient Account Number: [id number] Date of Birth/Sex: [birth year] (86 y.o. F) Treating RN: Army Melia Primary Care Jerilee Space: Ria Bush  Other Clinician: Referring Tiyonna Sardinha: Ria Bush Treating Cordarious Zeek/Extender: Melburn Hake, HOYT Weeks in Treatment: 10 Active Inactive Electronic Signature(s) Signed: 09/10/2019 3:21:14 PM By: Army Melia Entered By: Army Melia on 09/10/2019 14:52:23 Szostak, [name] ([id number]) -------------------------------------------------------------------------------- Pain Assessment Details Patient Name: [name] Date of Service: 09/10/2019 2:30 PM Medical Record Number: [id number] Patient Account Number: [id number] Date of Birth/Sex: 1920-11-19 (86 y.o. F) Treating RN: Grover Canavan Primary Care Jillisa Harris: Ria Bush Other Clinician: Referring Francois Elk: Ria Bush Treating Ashritha Desrosiers/Extender: Melburn Hake, HOYT Weeks in Treatment: 10 Active Problems Location of Pain Severity and Description of Pain Patient Has Paino No Site Locations Pain Management and Medication Current Pain Management: Electronic Signature(s) Signed: 09/10/2019 4:17:57 PM By: Grover Canavan Entered By: Grover Canavan on 09/10/2019  14:45:51 Keagle, [name] ([id number]) -------------------------------------------------------------------------------- Patient/Caregiver Education Details Patient Name: [name] Date of Service: 09/10/2019 2:30 PM Medical Record Number: [id number] Patient Account Number: [id number] Date of Birth/Gender: 07-Jan-1921 (86 y.o. F) Treating RN: Army Melia Primary Care Physician: Ria Bush Other Clinician: Referring Physician: Ria Bush Treating Physician/Extender: Sharalyn Ink in Treatment: 10 Education Assessment Education Provided To: Patient Education Topics Provided Wound/Skin Impairment: Handouts: Caring for Your Ulcer Methods: Demonstration, Explain/Verbal Responses: State content correctly Electronic Signature(s) Signed: 09/10/2019 3:21:14 PM By: Army Melia Entered By: Army Melia on 09/10/2019 14:55:02 Cartier, [name] ([id number]) -------------------------------------------------------------------------------- Wound Assessment Details Patient Name: [name] Date of Service: 09/10/2019 2:30 PM Medical Record Number: [id number] Patient Account Number: [id number] Date of Birth/Sex: 1920-08-26 (86 y.o. F) Treating RN: Grover Canavan Primary Care Ruairi Stutsman: Ria Bush Other Clinician: Referring Maurisa Tesmer: Ria Bush Treating Arbutus Nelligan/Extender: Melburn Hake, HOYT Weeks in Treatment: 10 Wound Status Wound Number: 5 Primary Etiology: Trauma, Other Wound Location: Left Forearm Wound Status: Healed - Epithelialized Wounding Event: Trauma Date Acquired: 06/12/2019 Weeks Of Treatment: 10 Clustered Wound: No Wound Measurements Length: (cm) Width: (cm) Depth: (cm) Area: (cm) Volume: (cm) 0 % Reduction in Area: 100% 0 % Reduction in Volume: 100% 0 0 0 Wound Description Classification: Full Thickness Without Exposed Support Structu res Electronic Signature(s) Signed: 09/10/2019 4:17:57 PM By: Grover Canavan Entered By: Grover Canavan on 09/10/2019 14:48:13 Septer, [name] ([id number]) -------------------------------------------------------------------------------- Wound Assessment Details Patient Name: [name] Date of Service: 09/10/2019 2:30 PM Medical Record Number: [id number] Patient Account Number: [id number] Date of Birth/Sex: 02-28-1920 (86 y.o. F) Treating RN: Grover Canavan Primary Care Clarinda Obi: Ria Bush Other Clinician: Referring Jaclin Finks: Ria Bush Treating Evann Koelzer/Extender: Melburn Hake, HOYT Weeks in Treatment: 10 Wound Status Wound Number: 9 Primary Etiology: Trauma, Other Wound Location: Right, Lateral Lower Leg Wound Status: Healed - Epithelialized Wounding Event: Blister Date Acquired: 08/07/2019 Weeks Of Treatment: 3 Clustered Wound: No Wound Measurements Length: (cm) Width: (cm) Depth: (cm) Area: (cm) Volume: (cm) 0 % Reduction in Area: 100% 0 % Reduction in Volume: 100% 0 0 0 Wound Description Classification: Full Thickness Without Exposed Support Structu res Electronic Signature(s) Signed: 09/10/2019 4:17:57 PM By: Grover Canavan Entered By: Grover Canavan on 09/10/2019 14:48:13 Frigon, [name] ([id number]) -------------------------------------------------------------------------------- Vitals Details Patient Name: [name] Date of Service: 09/10/2019 2:30 PM Medical Record Number: [id number] Patient Account Number: [id number] Date of Birth/Sex: Aug 27, 1920 (86 y.o. F) Treating RN: Grover Canavan Primary Care Alfons Sulkowski: Ria Bush Other Clinician: Referring Chasmine Lender: Ria Bush Treating Baylynn Shifflett/Extender: Melburn Hake, HOYT Weeks in Treatment: 10 Vital Signs Time Taken: 14:45 Temperature (F): 98 Height (in): 62 Pulse (bpm): 80 Weight (lbs): 113 Respiratory Rate (breaths/min): 14 Body Mass Index (BMI): 20.7 Blood Pressure (  mmHg): 132/60 Reference Range: 80 - 120 mg /  dl Electronic Signature(s) Signed: 09/10/2019 4:17:57 PM By: Grover Canavan Entered By: Grover Canavan on 09/10/2019 14:45:45

## 2019-09-10 NOTE — Progress Notes (Addendum)
SOLE, LENGACHER ([id number]) Visit Report for 09/10/2019 Chief Complaint Document Details Patient Name: [name], [name] Date of Service: 09/10/2019 2:30 PM Medical Record Number: [id number] Patient Account Number: [id number] Date of Birth/Sex: March 14, 1920 (86 y.o. F) Treating RN: Cornell Barman Primary Care Provider: Ria Bush Other Clinician: Referring Provider: Ria Bush Treating Provider/Extender: Melburn Hake, Anvay Tennis Weeks in Treatment: 10 Information Obtained from: Patient Chief Complaint Skin tear on the left forearm and right lower leg. Electronic Signature(s) Signed: 09/10/2019 2:32:37 PM By: Worthy Keeler PA-C Entered By: Worthy Keeler on 09/10/2019 14:32:36 Dulac, [name] ([id number]) -------------------------------------------------------------------------------- HPI Details Patient Name: [name] Date of Service: 09/10/2019 2:30 PM Medical Record Number: [id number] Patient Account Number: [id number] Date of Birth/Sex: 23-Sep-1920 (86 y.o. F) Treating RN: Cornell Barman Primary Care Provider: Ria Bush Other Clinician: Referring Provider: Ria Bush Treating Provider/Extender: Melburn Hake, Racine Erby Weeks in Treatment: 10 History of Present Illness HPI Description: 12/15/2018 on evaluation today patient presents for initial evaluation here in our clinic concerning issues that she has been having at multiple locations with skin tears. She had a fall roughly 2 weeks ago where she injured her left upper arm and forearm at that time. Subsequently she ended up as well with a skin tear to her leg when her family members were trying to get her into the bed her skin is so fragile that as she was lifting her legs the skin on the back of the leg tore. That is on the right. Nonetheless they have been trying to manage these I did see her primary care provider earlier in the week who referred her to [name] for further evaluation and treatment. Currently there does  not appear to be any signs of active infection at this time which is good news at any site. No fevers, chills, nausea, vomiting, or diarrhea. She does have pain but this is mainly on the leg ulcer the arms are really not to painful at this point. The patient does have a history of dementia, hypertension, congestive heart failure, and chronic kidney disease stage III. 12/29/2018 on evaluation today patient appears to be doing better in regard to her skin tears on the forearm both are healing quite nicely one is close the other is slightly hyper granulated which is probably preventing complete epithelization. With regard to her right lower extremity unfortunately she is still having a lot of dressing material/alginate which is stuck to the wound bed. This seems to be a result of unfortunately having a different type of alginate than what we actually ordered for her. We had ordered silver cell as this was more nonstick unfortunately she received a different alginate which is breaking down and getting somewhat matted into the wound. In fact I would have to try to get some of this off today if at all possible. Other than that she seems to actually be healing nicely she has much less drainage than previously noted. 01/02/19 on evaluation today patient appears to be doing well with regard to her left forearm and right lower extremity ulcers. The left forearm appears to be completely healed which is great news the right leg is doing much better that there's still some eschar/dressing material stuck to the wound it appears to be healing underneath quite well. The Xeroform did much better for her. 01/09/2019 on evaluation today patient appears to be doing very well with regard to her right lower extremity ulcer. She has been tolerating the dressing changes without complication. Unfortunately she still has  a lot of the alginate dressing stuck to the wound bed that somewhat mad again. It looks like there may be  some healing underneath some of these areas but nonetheless I feel like we really need to try to remove that today. This was discussed with the patient's daughter as well. The good news is the patient is really not having a lot of pain. 01/16/2019 upon evaluation today patient appears to be doing well with regard to her wound. In fact this is measuring quite a bit smaller and overall I am very pleased with how things seem to be progressing. The patient seems to be doing well although she does have dementia her family member who is with her today is very pleased with how things seem to be progressing. 01/30/2019 on evaluation today patient appears to be doing excellent in regard to her ulcer. She has been tolerating the dressing changes without complication. Fortunately there is no signs of anything remaining open at this point which is excellent.  Readmission:06/26/2019 upon evaluation today patient presents for reevaluation here in the clinic concerning issues unfortunately that she has with new wounds to her left forearm, left upper arm, right lower extremity, and left lower extremity. She had 2 different falls which actually resulted in the skin tears as noted here. Fortunately there is no evidence of active infection. I previously have seen her for skin tears at that time as well. She responded extremely well with Xeroform at that point. Alginate did not do well at all for her in fact the alginate that the home health company used at that time got stuck into her wound on the leg and we had to have a fairly significant debridement to remove all this that definitely do not want to go that way again. I completely understand. Nonetheless I think she responded so well to the Xeroform at that time that would be my go to dressing of choice at this point as well in fact this point they have been using it already some of the wounds seem to be doing better. Her left forearm is actually the worst of all the  wounds. 07/02/2019 upon evaluation today patient appears to be doing better in regard to most of her original wounds that she had upon last evaluation. Unfortunately the left elbow is a new skin tear that appears to be at least a day old may be 2 but again the skin has not reattached to the point that I think we cannot try to stretch this out and see if we get it reattached. This is not a fresh wound does not right here in the office is not bleeding enough for that and I showed that to the patient's family member as well as he question whether or not getting her into the chair because the skin tear. Again I do not believe there is any way possible that is the case with the way its not actively bleeding at this time. Again I explained that even pulling the skin over there is no guarantee that we can be able to get this to completely reattached but we will give it our best. 07/10/2019 upon evaluation today patient actually appears to be doing much better and a lot of her wounds are actually improving quite significantly which is great news. There does not appear to be any evidence of active infection which is also great news. Overall I am extremely pleased with how she is doing. She does have a new skin tear however  which occurred sometime over the weekend according to her family member who is with her today. Nonetheless this is minimal working to keep an eye on this it appears to be pretty stable and we will see how it appears next week. 07/24/2019 upon evaluation today patient appears to be doing well with regard to her elbow which in fact appears to be completely healed. Her arm ulcer is showing hyper granulation I think getting to switch out the dressing as this is much too wet. She also apparently has a blister on her right leg which is new due to her having hit this likely though nobody seems to know when this occurred. Apparently the home health nurse was to contact us concerning this although I  have not heard from them up to this point. 08/14/2019 on evaluation today patient appears to be doing well in regard to her forearm ulcer which is measuring much better and does seem to be doing excellent currently. In regard to her leg where she had a hematoma that has opened and actually appears to be healing quite nicely. Fortunately there is no signs of active infection at this time. No fevers, chills, nausea, vomiting, or diarrhea. RALPH, BENAVIDEZ ([id number]) 09/10/2019 on evaluation today patient appears to be doing excellent in regard to her arm and lower extremity ulcers. The forearm is actually completely healed as is the right leg ulcer. Both locations have done excellent. I think other than lotion she likely will not need anything further dressing wise. Electronic Signature(s) Signed: 09/10/2019 2:54:06 PM By: Worthy Keeler PA-C Entered By: Worthy Keeler on 09/10/2019 14:54:06 Gockley, [name] ([id number]) -------------------------------------------------------------------------------- Physical Exam Details Patient Name: [name] Date of Service: 09/10/2019 2:30 PM Medical Record Number: [id number] Patient Account Number: [id number] Date of Birth/Sex: September 03, 1920 (86 y.o. F) Treating RN: Cornell Barman Primary Care Provider: Ria Bush Other Clinician: Referring Provider: Ria Bush Treating Provider/Extender: STONE III, Aaro Meyers Weeks in Treatment: 32 Constitutional Well-nourished and well-hydrated in no acute distress. Respiratory normal breathing without difficulty. Psychiatric Patient is not able to cooperate in decision making regarding care. Patient has dementia. pleasant and cooperative. Notes Upon inspection patient's wounds both appear to be showing signs of excellent epithelization there does not appear to be any evidence of active infection overall very pleased with where things stand. Overall the patient seems like she is doing quite well to  me. Electronic Signature(s) Signed: 09/10/2019 2:54:22 PM By: Worthy Keeler PA-C Entered By: Worthy Keeler on 09/10/2019 14:54:22 Tyree, [name] ([id number]) -------------------------------------------------------------------------------- Physician Orders Details Patient Name: [name] Date of Service: 09/10/2019 2:30 PM Medical Record Number: [id number] Patient Account Number: [id number] Date of Birth/Sex: May 03, 1920 (86 y.o. F) Treating RN: Army Melia Primary Care Provider: Ria Bush Other Clinician: Referring Provider: Ria Bush Treating Provider/Extender: Melburn Hake, Zamoria Boss Weeks in Treatment: 10 Verbal / Phone Orders: No Diagnosis Coding ICD-10 Coding Code Description S51.802A Unspecified open wound of left forearm, initial encounter S81.801A Unspecified open wound, right lower leg, initial encounter F03.90 Unspecified dementia without behavioral disturbance I10 Essential (primary) hypertension H47.42 Chronic diastolic (congestive) heart failure N18.30 Chronic kidney disease, stage 3 unspecified Home Health o D/C [place] Discharge From [facility] o Discharge from [employer] complete Electronic Signature(s) Signed: 09/10/2019 2:58:17 PM By: Army Melia Signed: 09/11/2019 4:23:25 PM By: Worthy Keeler PA-C Entered By: Army Melia on 09/10/2019 14:58:16 Egelhoff, [name] ([id number]) -------------------------------------------------------------------------------- Problem List Details Patient Name: [name]. Date  of Service: 09/10/2019 2:30 PM Medical Record Number: [id number] Patient Account Number: [id number] Date of Birth/Sex: 1920/08/29 (86 y.o. F) Treating RN: Cornell Barman Primary Care Provider: Ria Bush Other Clinician: Referring Provider: Ria Bush Treating Provider/Extender: Melburn Hake, Adele Milson Weeks in Treatment: 10 Active Problems ICD-10 Encounter Code Description Active Date  MDM Diagnosis S51.802A Unspecified open wound of left forearm, initial encounter 06/26/2019 No Yes S81.801A Unspecified open wound, right lower leg, initial encounter 06/26/2019 No Yes F03.90 Unspecified dementia without behavioral disturbance 06/26/2019 No Yes I10 Essential (primary) hypertension 06/26/2019 No Yes N56.21 Chronic diastolic (congestive) heart failure 06/26/2019 No Yes N18.30 Chronic kidney disease, stage 3 unspecified 06/26/2019 No Yes Inactive Problems Resolved Problems ICD-10 Code Description Active Date Resolved Date S41.102A Unspecified open wound of left upper arm, initial encounter 06/26/2019 06/26/2019 S81.802A Unspecified open wound, left lower leg, initial encounter 06/26/2019 06/26/2019 Electronic Signature(s) Signed: 09/10/2019 2:32:28 PM By: Worthy Keeler PA-C Entered By: Worthy Keeler on 09/10/2019 14:32:27 Rao, [name] ([id number]) -------------------------------------------------------------------------------- Progress Note Details Patient Name: [name] Date of Service: 09/10/2019 2:30 PM Medical Record Number: [id number] Patient Account Number: [id number] Date of Birth/Sex: [birth year] (86 y.o. F) Treating RN: Cornell Barman Primary Care Provider: Ria Bush Other Clinician: Referring Provider: Ria Bush Treating Provider/Extender: Melburn Hake, Laureen Frederic Weeks in Treatment: 10 Subjective Chief Complaint Information obtained from Patient Skin tear on the left forearm and right lower leg. History of Present Illness (HPI) 12/15/2018 on evaluation today patient presents for initial evaluation here in our clinic concerning issues that she has been having at multiple locations with skin tears. She had a fall roughly 2 weeks ago where she injured her left upper arm and forearm at that time. Subsequently she ended up as well with a skin tear to her leg when her family members were trying to get her into the bed her skin is so fragile that as she was lifting  her legs the skin on the back of the leg tore. That is on the right. Nonetheless they have been trying to manage these I did see her primary care provider earlier in the week who referred her to [name] for further evaluation and treatment. Currently there does not appear to be any signs of active infection at this time which is good news at any site. No fevers, chills, nausea, vomiting, or diarrhea. She does have pain but this is mainly on the leg ulcer the arms are really not to painful at this point. The patient does have a history of dementia, hypertension, congestive heart failure, and chronic kidney disease stage III. 12/29/2018 on evaluation today patient appears to be doing better in regard to her skin tears on the forearm both are healing quite nicely one is close the other is slightly hyper granulated which is probably preventing complete epithelization. With regard to her right lower extremity unfortunately she is still having a lot of dressing material/alginate which is stuck to the wound bed. This seems to be a result of unfortunately having a different type of alginate than what we actually ordered for her. We had ordered silver cell as this was more nonstick unfortunately she received a different alginate which is breaking down and getting somewhat matted into the wound. In fact I would have to try to get some of this off today if at all possible. Other than that she seems to actually be healing nicely she has much less drainage than previously noted. 01/02/19 on evaluation today patient appears to be doing well  with regard to her left forearm and right lower extremity ulcers. The left forearm appears to be completely healed which is great news the right leg is doing much better that there's still some eschar/dressing material stuck to the wound it appears to be healing underneath quite well. The Xeroform did much better for her. 01/09/2019 on evaluation today patient appears to be doing very  well with regard to her right lower extremity ulcer. She has been tolerating the dressing changes without complication. Unfortunately she still has a lot of the alginate dressing stuck to the wound bed that somewhat mad again. It looks like there may be some healing underneath some of these areas but nonetheless I feel like we really need to try to remove that today. This was discussed with the patient's daughter as well. The good news is the patient is really not having a lot of pain. 01/16/2019 upon evaluation today patient appears to be doing well with regard to her wound. In fact this is measuring quite a bit smaller and overall I am very pleased with how things seem to be progressing. The patient seems to be doing well although she does have dementia her family member who is with her today is very pleased with how things seem to be progressing. 01/30/2019 on evaluation today patient appears to be doing excellent in regard to her ulcer. She has been tolerating the dressing changes without complication. Fortunately there is no signs of anything remaining open at this point which is excellent.  Readmission:06/26/2019 upon evaluation today patient presents for reevaluation here in the clinic concerning issues unfortunately that she has with new wounds to her left forearm, left upper arm, right lower extremity, and left lower extremity. She had 2 different falls which actually resulted in the skin tears as noted here. Fortunately there is no evidence of active infection. I previously have seen her for skin tears at that time as well. She responded extremely well with Xeroform at that point. Alginate did not do well at all for her in fact the alginate that the home health company used at that time got stuck into her wound on the leg and we had to have a fairly significant debridement to remove all this that definitely do not want to go that way again. I completely understand. Nonetheless I think she  responded so well to the Xeroform at that time that would be my go to dressing of choice at this point as well in fact this point they have been using it already some of the wounds seem to be doing better. Her left forearm is actually the worst of all the wounds. 07/02/2019 upon evaluation today patient appears to be doing better in regard to most of her original wounds that she had upon last evaluation. Unfortunately the left elbow is a new skin tear that appears to be at least a day old may be 2 but again the skin has not reattached to the point that I think we cannot try to stretch this out and see if we get it reattached. This is not a fresh wound does not right here in the office is not bleeding enough for that and I showed that to the patient's family member as well as he question whether or not getting her into the chair because the skin tear. Again I do not believe there is any way possible that is the case with the way its not actively bleeding at this time. Again I explained that even  pulling the skin over there is no guarantee that we can be able to get this to completely reattached but we will give it our best. 07/10/2019 upon evaluation today patient actually appears to be doing much better and a lot of her wounds are actually improving quite significantly which is great news. There does not appear to be any evidence of active infection which is also great news. Overall I am extremely pleased with how she is doing. She does have a new skin tear however which occurred sometime over the weekend according to her family member who is with her today. Nonetheless this is minimal working to keep an eye on this it appears to be pretty stable and we will see how it appears next week. 07/24/2019 upon evaluation today patient appears to be doing well with regard to her elbow which in fact appears to be completely healed. Her arm ulcer is showing hyper granulation I think getting to switch out the  dressing as this is much too wet. She also apparently has a blister on her right leg which is new due to her having hit this likely though nobody seems to know when this occurred. Apparently the home health nurse was MARRANDA, ARAKELIAN. ([id number]) to contact us concerning this although I have not heard from them up to this point. 08/14/2019 on evaluation today patient appears to be doing well in regard to her forearm ulcer which is measuring much better and does seem to be doing excellent currently. In regard to her leg where she had a hematoma that has opened and actually appears to be healing quite nicely. Fortunately there is no signs of active infection at this time. No fevers, chills, nausea, vomiting, or diarrhea. 09/10/2019 on evaluation today patient appears to be doing excellent in regard to her arm and lower extremity ulcers. The forearm is actually completely healed as is the right leg ulcer. Both locations have done excellent. I think other than lotion she likely will not need anything further dressing wise. Objective Constitutional Well-nourished and well-hydrated in no acute distress. Vitals Time Taken: 2:45 PM, Height: 62 in, Weight: 113 lbs, BMI: 20.7, Temperature: 98 F, Pulse: 80 bpm, Respiratory Rate: 14 breaths/min, Blood Pressure: 132/60 mmHg. Respiratory normal breathing without difficulty. Psychiatric Patient is not able to cooperate in decision making regarding care. Patient has dementia. pleasant and cooperative. General Notes: Upon inspection patient's wounds both appear to be showing signs of excellent epithelization there does not appear to be any evidence of active infection overall very pleased with where things stand. Overall the patient seems like she is doing quite well to me. Integumentary (Hair, Skin) Wound #5 status is Healed - Epithelialized. Original cause of wound was Trauma. The wound is located on the Left Forearm. The wound measures 0cm length x 0cm  width x 0cm depth; 0cm^2 area and 0cm^3 volume. Wound #9 status is Healed - Epithelialized. Original cause of wound was Blister. The wound is located on the Right,Lateral Lower Leg. The wound measures 0cm length x 0cm width x 0cm depth; 0cm^2 area and 0cm^3 volume. Assessment Active Problems ICD-10 Unspecified open wound of left forearm, initial encounter Unspecified open wound, right lower leg, initial encounter Unspecified dementia without behavioral disturbance Essential (primary) hypertension Chronic diastolic (congestive) heart failure Chronic kidney disease, stage 3 unspecified Plan Discharge From [facility] Services: Discharge from [place] complete 1. I would recommend that we discontinue the wound care measures that were previously in place and at this  time I think just using a sock for protection and moisturizing lotion is the ideal thing to do I also would recommend long pants for her at all times as you do not want anything to bump or hit her skin is very fragile obviously. NARJIS, MIRA ([id number]) 2. I would also recommend that they monitor for any signs of worsening infection I do not see anything right now but if anything changes they should let me know. We will see the patient back for follow-up visit as needed. Electronic Signature(s) Signed: 09/10/2019 2:55:07 PM By: Worthy Keeler PA-C Entered By: Worthy Keeler on 09/10/2019 14:55:07 Ganas, [name] ([id number]) -------------------------------------------------------------------------------- SuperBill Details Patient Name: [name] Date of Service: 09/10/2019 Medical Record Number: [id number] Patient Account Number: [id number] Date of Birth/Sex: 1920/05/15 (86 y.o. F) Treating RN: Cornell Barman Primary Care Provider: Ria Bush Other Clinician: Referring Provider: Ria Bush Treating Provider/Extender: Melburn Hake, Demarko Zeimet Weeks in Treatment: 10 Diagnosis Coding ICD-10  Codes Code Description S51.802A Unspecified open wound of left forearm, initial encounter S81.801A Unspecified open wound, right lower leg, initial encounter F03.90 Unspecified dementia without behavioral disturbance I10 Essential (primary) hypertension E33.29 Chronic diastolic (congestive) heart failure N18.30 Chronic kidney disease, stage 3 unspecified Facility Procedures CPT4 Code: [id number] Description: 99213 - WOUND CARE VISIT-LEV 3 EST PT Modifier: Quantity: 1 Physician Procedures CPT4 Code: [id number] Description: 10932 - WC PHYS LEVEL 3 - EST PT Modifier: Quantity: 1 CPT4 Code: Description: ICD-10 Diagnosis Description S51.802A Unspecified open wound of left forearm, initial encounter S81.801A Unspecified open wound, right lower leg, initial encounter F03.90 Unspecified dementia without behavioral disturbance I10 Essential  (primary) hypertension Modifier: Quantity: Electronic Signature(s) Signed: 09/10/2019 2:56:19 PM By: Worthy Keeler PA-C Entered By: Worthy Keeler on 09/10/2019 14:56:19

## 2019-10-04 ENCOUNTER — Other Ambulatory Visit: Payer: Self-pay

## 2019-10-04 ENCOUNTER — Other Ambulatory Visit: Payer: Medicare Other | Admitting: Hospice

## 2019-10-04 DIAGNOSIS — I5032 Chronic diastolic (congestive) heart failure: Secondary | ICD-10-CM

## 2019-10-04 DIAGNOSIS — Z515 Encounter for palliative care: Secondary | ICD-10-CM

## 2019-10-04 NOTE — Progress Notes (Addendum)
[facility] Consult Note Telephone: [phone]  Fax: [phone]  PATIENT NAME: [name] DOB: 02-Nov-1920 MRN: [id number]  PRIMARY CARE PROVIDER:   Ria Bush, MD Ria Bush, MD [address] [place],  [place] [address]  REFERRING PROVIDER: Ria Bush, MD Ria Bush, MD [place],  [place] [address]  RESPONSIBLE PARTY: Self - Dot daughter- [phone] Contact: Dot's husband Rolan Bucco [phone]       RECOMMENDATIONS/PLAN:   Advance Care Planning:  Visit consisted of building trust and discussions on Palliative Medicine as specialized medical care for people living with serious illness, aimed at facilitating better quality of life through symptoms relief, assisting with advance care plan and establishing goals of care.   Code Status: CODE STATUS reviewed today.  Patient is a DO NOT RESUSCITATE.  Goals of Care: Goals of care include to maximize quality of life and symptom management.  Extensive discussions today on medical orders for scope of treatment as it reflects patient's goals of care.  MOST selections today include comfort measures antibiotics as determined when infection occurs, IV fluids for defined trial.,  No feeding tube.  Signed MOST form left at home for patient; same document uploaded to epic today.  Family is also open to consider hospice service in the future.  Palliative care team will continue to provide support to patient, family, and the medical team.    Follow up: Palliative care will continue to follow patient for goals of care clarification and symptom management.Follow-up in 3 months or as needed.  Symptom management: No significant changes since last visit: Patient in no acute distress.  No recent fall/hospitalization.  Patient denies pain/discomfort; she was working on her puzzle.  Patient is compliant with her medications; continues on  amlodipine, furosemide, levothyroxine present.  Dot with no concerns at this time.  Encouraged ongoing care.  Family /Caregiver/Community Supports:  Strong family support identified.  Patient lives at home with her daughter DOT/daughter's husband.  I spent 1 hour and 16 minutes providing this consultation; time includes time spent with patient/family, chart review, provider coordination,  and documentation. More than 50% of the time in this consultation was spent on coordinating communication  HISTORY OF PRESENT ILLNESS:  [name] is a 86 y.o. year old female with multiple medical problems including Protein caloric malnutrition, HTN Hypothyroidism, hx of malignant neoplasm of GI tract.  Palliative Care was asked to help address goals of care.    CODE STATUS: DNR  PPS: 40% HOSPICE ELIGIBILITY/DIAGNOSIS: TBD  PAST MEDICAL HISTORY:  Past Medical History:  Diagnosis Date  . Acute hemorrhagic cystitis 12/04/2014  . Anemia 12/30-01/25/2006   [facility]- CHF- started lasix  . CKD (chronic kidney disease), stage III 03/04/2010  . COPD (chronic obstructive pulmonary disease) ([facility]) 02/2014   by CXR and VQ scan  . Diastolic CHF ([place])   . Gross hematuria 12/31/2014  . History of colon cancer    s/p partial colectomy  . History of ETT 03/15/2006   adenosine myoview low risk  . Hyperlipidemia 12/2002  . Hypertension 10/1998  . Hypothyroidism   . Left displaced femoral neck fracture ([place] [facility]) 02/01/2014  . Microhematuria 03/2013   watchful waiting ([place])  . Right lower lobe pneumonia 03/10/2011  . Vaginal delivery    x4, at home    SOCIAL HX:  Social History   Tobacco Use  . Smoking status: Never Smoker  . Smokeless tobacco:  Never Used  Substance Use Topics  . Alcohol use: No    ALLERGIES: No Known Allergies   PERTINENT MEDICATIONS:  Outpatient Encounter Medications as of 10/04/2019  Medication Sig  . acetaminophen (TYLENOL) 325 MG tablet Take 2 tablets (650 mg total) by mouth every 6  (six) hours as needed for mild pain or moderate pain (or Fever >/= 101).  Marland Kitchen amLODipine (NORVASC) 5 MG tablet Take 1 tablet (5 mg total) by mouth daily. For blood pressure  . Cholecalciferol (VITAMIN D3) 1000 UNITS CAPS Take by mouth 2 (two) times daily.    . Cyanocobalamin (VITAMIN B-12) 500 MCG TABS Take 500 mcg by mouth daily.  . ferrous sulfate 325 (65 FE) MG tablet Take 1 tablet (325 mg total) by mouth every Monday, Wednesday, and Friday.  . furosemide (LASIX) 20 MG tablet Take 0.5 tablets (10 mg total) by mouth daily as needed for fluid (leg swelling).  Marland Kitchen levothyroxine (SYNTHROID) 25 MCG tablet TAKE 1 TABLET BY MOUTH DAILY BEFORE BREAKFAST for thyroid.  . Multiple Vitamins-Minerals (EYE VITAMINS & MINERALS PO) Take 1 tablet by mouth daily. [facility]  . omeprazole (PRILOSEC OTC) 20 MG tablet Take 1 tablet (20 mg total) by mouth daily as needed (reflux/heartburn).   No facility-administered encounter medications on file as of 10/04/2019.    PHYSICAL EXAM/ROS: Current weight: 112 pounds, height: 5 feet and 4 inches General: NAD, frail appearing, thin, cooperative Cardiovascular: regular rate and rhythm; denies chest pain Pulmonary: clear ant/post fields; no adventitious lung sounds auscultated, no coughing, no shortness of breath Abdomen: soft, nontender, + bowel sounds GU: no suprapubic tenderness Extremities: wound on left wrist healed. Skin: no rashes to exposed skin Neurological: Weakness but otherwise nonfocal  Teodoro Spray, NP

## 2019-10-19 ENCOUNTER — Encounter: Payer: Self-pay | Admitting: Family Medicine

## 2019-10-19 ENCOUNTER — Ambulatory Visit (INDEPENDENT_AMBULATORY_CARE_PROVIDER_SITE_OTHER): Payer: Medicare Other | Admitting: Family Medicine

## 2019-10-19 ENCOUNTER — Other Ambulatory Visit: Payer: Self-pay

## 2019-10-19 VITALS — BP 124/68 | HR 73 | Temp 97.5°F | Ht 63.5 in | Wt 108.3 lb

## 2019-10-19 DIAGNOSIS — S51812A Laceration without foreign body of left forearm, initial encounter: Secondary | ICD-10-CM | POA: Diagnosis not present

## 2019-10-19 DIAGNOSIS — R531 Weakness: Secondary | ICD-10-CM

## 2019-10-19 DIAGNOSIS — Z23 Encounter for immunization: Secondary | ICD-10-CM

## 2019-10-19 DIAGNOSIS — S81801A Unspecified open wound, right lower leg, initial encounter: Secondary | ICD-10-CM
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
## 2019-10-19 NOTE — Assessment & Plan Note (Signed)
2 separate leg wounds on right, of unclear duration - no signs of infection at this time. Dressed with non stick gauze and kerlex. Continue home care.

## 2019-10-19 NOTE — Assessment & Plan Note (Signed)
Wounds dressed in office with abx ointment or xeroform. Home wound care reviewed. Will also refer to [facility] wound care to follow wounds. Pt and son in law agree with plan.

## 2019-10-19 NOTE — Patient Instructions (Addendum)
Flu shot today Continue wound dressing changes daily - may use antibiotic ointment for wound on forearm, for larger skin tear continue xeroform dressing. Continue changes every other day.  Also change dressing every other day for leg.  I will ask home health wound nurse to come out to the house for assistance. Let us know if not healing as expected.

## 2019-10-24 ENCOUNTER — Telehealth: Payer: Self-pay

## 2019-10-24 NOTE — Telephone Encounter (Signed)
Pt's SIL, Roxy Horseman, called to find out the status of the referral to [facility] It looked like it was in process on our end. He called [facility] and they said they did not have it on their end. Please advise at [phone].

## 2019-10-25 NOTE — Telephone Encounter (Signed)
Unfortunately, [facility] does not have the staffing capacity to accept patient.  Message has been sent to [facility] to see if they can accept patient.   Message left for son in law to return phone call so I can relay this information.

## 2019-10-29 NOTE — Telephone Encounter (Signed)
Spoke with patient's SIL Phillip Heal, made him aware of the information below. He states that the patient lives right up the street from their former [place] and he would really like for her to be the patient's nurse again. I told him that we were advised by [facility] that they could not take her back on for services at this time and that we sent her information [facility] to try and figure out if they could take her on. Phillip Heal states that he is going to reach out to Amy and let her know what we were told and see if there is anything that she can do to get services restarted since Ms Rosman is a former patient. He will let us know after he speaks with Amy.   Will send to [facility] as FYI  Have you heard back from [facility]?

## 2019-10-30 NOTE — Telephone Encounter (Signed)
[name] is unfortunately unable to staff patient right now.

## 2019-11-01 ENCOUNTER — Telehealth: Payer: Self-pay

## 2019-11-01 NOTE — Telephone Encounter (Signed)
Community message sent [facility] to see if they are able to take patient.

## 2019-11-01 NOTE — Telephone Encounter (Signed)
Error

## 2019-11-02 ENCOUNTER — Telehealth: Payer: Self-pay

## 2019-11-02 MED ORDER — HYDROXYZINE HCL 10 MG PO TABS: 10.0000 mg | ORAL_TABLET | Freq: Every evening | ORAL | 0 refills | Status: DC | PRN

## 2019-11-02 NOTE — Telephone Encounter (Signed)
Patient's son in law called contacted the office and states thatfor there past 3 days, the patient has not been sleeping at night, and is up banging on the walls and yelling back and forth to her daughter and son in law.  Patient's son is wondering if there is a medication or something he can do to help her sleep at night? Dr. Darnell Level, please advise.

## 2019-11-02 NOTE — Telephone Encounter (Signed)
Spoke with pt's daughter, Dot (on dpr), asking Dr. Synthia Innocent questions.  She denies pt having any constipation, UTI sxs, signs of pain, fever or signs of skin infection.  I relayed Dr. Synthia Innocent message.  Dot verbalizes understanding.

## 2019-11-02 NOTE — Telephone Encounter (Signed)
SIL has called back asking that someone call back today because they cannot handle this all weekend.

## 2019-11-02 NOTE — Addendum Note (Signed)
Addended by: Ria Bush on: 11/02/2019 04:44 PM   Modules accepted: Orders

## 2019-11-02 NOTE — Telephone Encounter (Addendum)
Any constipation, UTI symptoms, or signs of increased pain? Any fever or signs of skin infection?  May try hydroxyzine 1-2 tab at night to help sleep. Sent to pharmacy.  If ongoing into next week, please schedule OV with available provider.

## 2019-11-25 ENCOUNTER — Other Ambulatory Visit: Payer: Self-pay | Admitting: Family Medicine

## 2019-11-27 NOTE — Telephone Encounter (Signed)
Do you want to refill the hydroxyzine?

## 2019-12-24 ENCOUNTER — Other Ambulatory Visit: Payer: Self-pay

## 2019-12-24 ENCOUNTER — Other Ambulatory Visit: Payer: Medicare Other | Admitting: Hospice

## 2019-12-24 DIAGNOSIS — Z515 Encounter for palliative care: Secondary | ICD-10-CM

## 2019-12-24 DIAGNOSIS — I5032 Chronic diastolic (congestive) heart failure: Secondary | ICD-10-CM

## 2019-12-24 NOTE — Progress Notes (Signed)
[facility] Consult Note Telephone: [phone]  Fax: [phone]  PATIENT NAME: [name] DOB: 12-08-1920 MRN: [id number]  PRIMARY CARE PROVIDER:   Ria Bush, MD Ria Bush, MD [address] [place],  [place] [address]  REFERRING PROVIDER: Ria Bush, MD Ria Bush, [place],  [place] [address]  RESPONSIBLE PARTY:Self - [phone] Contact: Dot's husband Rolan Bucco [phone]      RECOMMENDATIONS/PLAN:   Advance Care Planning:  Visit consisted of building trust and discussions on Palliative Medicine as specialized medical care for people living with serious illness, aimed at facilitating better quality of life through symptoms relief, assisting with advance care plan and establishing goals of care.   Code Status: CODE STATUS reviewed.  Patient is a DO NOT RESUSCITATE.  Goals of Care: Goals of care include to maximize quality of life and symptom management.  MOST selections include comfort measures antibiotics as determined when infection occurs, IV fluids for defined trial.,  No feeding tube.  Signed MOST form left at home for patient; same document uploaded to epic today.  Family is also open to consider hospice service in the future.  Palliative care team will continue to provide support to patient, family, and the medical team.    Follow up: Palliative care will continue to follow patient for goals of care clarification and symptom management. Follow-up in 3 months or as needed.  Symptom management: Patient in no medical acuity. No significant changes since last visit.  Pain: Patient denied pain. Patient in no acute distress.  No recent fall/hospitalization. Appetite is fair to good, no weight loss. She was working on her picture puzzle.  Patient is compliant with her medications; continues on amlodipine, furosemide, levothyroxine.  Dot  with no concerns at this time.  Encouraged ongoing care.  Family /Caregiver/Community Supports: Strong family support identified.  Patient lives at home with her daughter DOT/daughter's husband Phillip Heal.  I spent 48 minutes providing this consultation; time includes time spent with patient/family, chart review, provider coordination,  and documentation. More than 50% of the time in this consultation was spent on coordinating communication  HISTORY OF PRESENT ILLNESS:Traci W Ledfordis a 86 y.o.year oldfemalewith multiple medical problems including CHF, Protein caloric malnutrition, HTN Hypothyroidism, hx of malignant neoplasm of GI tract.Palliative Care was asked to help address goals of care.    CODE STATUS: DNR  PPS: 40% HOSPICE ELIGIBILITY/DIAGNOSIS: TBD  PAST MEDICAL HISTORY:  Past Medical History:  Diagnosis Date  . Acute hemorrhagic cystitis 12/04/2014  . Anemia 12/30-01/25/2006   MCH- CHF- started lasix  . CKD (chronic kidney disease), stage III 03/04/2010  . COPD (chronic obstructive pulmonary disease) ([place]) 02/2014   by CXR and VQ scan  . Diastolic CHF ([place])   . Gross hematuria 12/31/2014  . History of colon cancer    s/p partial colectomy  . History of ETT 03/15/2006   adenosine myoview low risk  . Hyperlipidemia 12/2002  . Hypertension 10/1998  . Hypothyroidism   . Left displaced femoral neck fracture ([place]) 02/01/2014  . Microhematuria 03/2013   watchful waiting ([place])  . Right lower lobe pneumonia 03/10/2011  . Vaginal delivery    x4, at home    SOCIAL HX:  Social History   Tobacco Use  . Smoking status: Never Smoker  . Smokeless tobacco: Never Used  Substance Use Topics  . Alcohol use: No    ALLERGIES: No Known Allergies  PERTINENT MEDICATIONS:  Outpatient Encounter Medications as of 12/24/2019  Medication Sig  . acetaminophen (TYLENOL) 325 MG tablet Take 2 tablets (650 mg total) by mouth every 6 (six) hours as needed for mild pain or moderate  pain (or Fever >/= 101).  Marland Kitchen amLODipine (NORVASC) 5 MG tablet Take 1 tablet (5 mg total) by mouth daily. For blood pressure  . Cholecalciferol (VITAMIN D3) 1000 UNITS CAPS Take by mouth 2 (two) times daily.    . Cyanocobalamin (VITAMIN B-12) 500 MCG TABS Take 500 mcg by mouth daily.  . ferrous sulfate 325 (65 FE) MG tablet Take 1 tablet (325 mg total) by mouth every Monday, Wednesday, and Friday.  . furosemide (LASIX) 20 MG tablet Take 0.5 tablets (10 mg total) by mouth daily as needed for fluid (leg swelling).  . hydrOXYzine (ATARAX/VISTARIL) 10 MG tablet TAKE 1 TO 2 TABLETS BY MOUTH AT BEDTIME AS NEEDED FOR ANXIETY/SLEEP  . levothyroxine (SYNTHROID) 25 MCG tablet TAKE 1 TABLET BY MOUTH DAILY BEFORE BREAKFAST for thyroid.  . Multiple Vitamins-Minerals (EYE VITAMINS & MINERALS PO) Take 1 tablet by mouth daily. [facility]  . omeprazole (PRILOSEC OTC) 20 MG tablet Take 1 tablet (20 mg total) by mouth daily as needed (reflux/heartburn).   No facility-administered encounter medications on file as of 12/24/2019.    PHYSICAL EXAM/ROS:  Current weight: 113 pounds, height: 5 feet and 4 inches General: NAD, frail appearing, thin, cooperative Cardiovascular: regular rate and rhythm;denies chest pain Pulmonary: clear ant/postfields;no adventitious lung sounds auscultated, no shortness of breath Abdomen: soft, nontender, + bowel sounds Extremities: no edema to BLE, protective wrap to BLE by wound nurse to prevent skin tear GU: no suprapubic tenderness Skin: fragile skin that tears easily; no rashesto exposed skin Neurological: Weakness but otherwise nonfocal   Teodoro Spray, NP

## 2020-01-03 ENCOUNTER — Encounter: Payer: Self-pay | Admitting: Family Medicine

## 2020-01-03 ENCOUNTER — Telehealth: Payer: Self-pay | Admitting: Family Medicine

## 2020-01-03 ENCOUNTER — Ambulatory Visit (INDEPENDENT_AMBULATORY_CARE_PROVIDER_SITE_OTHER): Payer: Medicare Other | Admitting: Family Medicine

## 2020-01-03 ENCOUNTER — Other Ambulatory Visit: Payer: Self-pay

## 2020-01-03 VITALS — BP 120/64 | HR 71 | Temp 97.5°F | Ht 63.5 in | Wt 106.5 lb

## 2020-01-03 DIAGNOSIS — R234 Changes in skin texture: Secondary | ICD-10-CM | POA: Diagnosis not present

## 2020-01-03 NOTE — Telephone Encounter (Signed)
E-scribed refills.  Plz schedule wellness, labs and cpe.

## 2020-01-03 NOTE — Assessment & Plan Note (Addendum)
Several scabs on both legs from prior skin tears  Clean and healing -no open skin or signs of infection  Disc protection from further trauma  Use of non  Stick bandages when needed  inst to avoid adhesive directly on the skin  Dressed with non stick pads and fluffy kerlex today (supplied by our office) Pt tolerated well  Will watch for s/s of infection or ulcer

## 2020-01-03 NOTE — Progress Notes (Signed)
Subjective:    Patient ID: [name], female    DOB: [birth year], 86 y.o.   MRN: [id number]  This visit occurred during the SARS-CoV-2 public health emergency.  Safety protocols were in place, including screening questions prior to the visit, additional usage of staff PPE, and extensive cleaning of exam room while observing appropriate contact time as indicated for disinfecting solutions.    HPI 86 yo pt of Dr Darnell Level presents for wounds on legs (h/o dementia and CKD and CHF along with past protein calorie malnutrition Palliative care status    Wt Readings from Last 3 Encounters:  01/03/20 106 lb 8 oz (48.3 kg)  10/19/19 108 lb 5 oz (49.1 kg)  08/27/19 112 lb 3 oz (50.9 kg)   18.57 kg/m  Pt gets frequent wounds on legs  Gets "bloot under skin" and then they break open  Has seen Dr Joaquim Lai in wound care  Did not have appt for 2 mo (he left a message)  Has a history of bumping arms /legs  Bruises easily and thin skin-very easy to tear   Caregiver uses calcium aliginate on arms  Xeroform on legs (bigger ones)  They seem to help   Cleans wounds with a wound spray  Not using soap or water  Does take occ bath  No swelling Takes lasix low dose daily     No illness  Acting like her usual self   Lab Results  Component Value Date   CREATININE 1.63 (H) 06/14/2019   BUN 35 (H) 06/14/2019   NA 140 06/14/2019   K 3.7 06/14/2019   CL 106 06/14/2019   CO2 25 06/14/2019   Lab Results  Component Value Date   WBC 10.8 (H) 06/14/2019   HGB 11.7 (L) 06/14/2019   HCT 36.9 06/14/2019   MCV 98.9 06/14/2019   PLT PLATELET CLUMPS NOTED ON SMEAR, UNABLE TO ESTIMATE 06/14/2019   Patient Active Problem List   Diagnosis Date Noted  . Scab 01/03/2020  . General weakness 06/19/2019  . Protein-calorie malnutrition ([place]) 06/19/2019  . Skin tear of left forearm without complication 32/67/1245  . Open leg wound 12/13/2018  . Recurrent falls 12/13/2018  . Tremor of right hand  07/09/2014  . Hearing loss due to cerumen impaction 12/04/2013  . Advanced care planning/counseling discussion 11/12/2013  . Health maintenance examination 11/12/2013  . Senile dementia ([place]) 05/22/2013  . Hypothyroidism   . Medicare annual wellness visit, subsequent 11/08/2011  . Personal history of malignant neoplasm of gastrointestinal tract 06/16/2010  . CKD (chronic kidney disease), stage III ([place]) 03/04/2010  . Vitamin D deficiency 02/25/2009  . POLYP, COLON 01/09/2007  . Essential hypertension 07/07/2006  . Chronic diastolic CHF (congestive heart failure) ([place]) 05/25/2006  . History of pernicious anemia 01/23/2006   Past Medical History:  Diagnosis Date  . Acute hemorrhagic cystitis 12/04/2014  . Anemia 12/30-01/25/2006   MCH- CHF- started lasix  . CKD (chronic kidney disease), stage III ([place]) 03/04/2010  . COPD (chronic obstructive pulmonary disease) ([place]) 02/2014   by CXR and VQ scan  . Diastolic CHF ([place])   . Gross hematuria 12/31/2014  . History of colon cancer    s/p partial colectomy  . History of ETT 03/15/2006   adenosine myoview low risk  . Hyperlipidemia 12/2002  . Hypertension 10/1998  . Hypothyroidism   . Left displaced femoral neck fracture ([place]) 02/01/2014  . Microhematuria 03/2013   watchful waiting ([place])  . Right lower lobe pneumonia 03/10/2011  .  Vaginal delivery    x4, at home   Past Surgical History:  Procedure Laterality Date  . CHOLECYSTECTOMY  1984  . COLONOSCOPY  03/2010   1 tubular adenoma, int hemorrhoids, f/u prn [name]  . CT HEAD LIMITED W/O CM  02/2011   no acute process.  Mild to moderate cortical volume loss and scattered small vessel ischemic microangiopathy  . ESOPHAGOGASTRODUODENOSCOPY     B9 polyp H.Pylori bx- "dilated" polyps  . HIP ARTHROPLASTY Left 02/01/2014   hemiarthroplasty s/p fem fracture after fall [name], MD)  . History of Abd  02/25/2006   bilat renal cysts, no mets  . PARTIAL COLECTOMY  03/25/2006   right    Social History   Tobacco Use  . Smoking status: Never Smoker  . Smokeless tobacco: Never Used  Substance Use Topics  . Alcohol use: No  . Drug use: No   Family History  Problem Relation Age of Onset  . Stroke Sister   . Diabetes Sister   . Hypertension Sister   . Stroke Mother   . Alcohol abuse Brother    No Known Allergies Current Outpatient Medications on File Prior to Visit  Medication Sig Dispense Refill  . acetaminophen (TYLENOL) 325 MG tablet Take 2 tablets (650 mg total) by mouth every 6 (six) hours as needed for mild pain or moderate pain (or Fever >/= 101). 80 tablet 0  . Cholecalciferol (VITAMIN D3) 1000 UNITS CAPS Take by mouth 2 (two) times daily.    . Cyanocobalamin (VITAMIN B-12) 500 MCG TABS Take 500 mcg by mouth daily.    . ferrous sulfate 325 (65 FE) MG tablet Take 1 tablet (325 mg total) by mouth every Monday, Wednesday, and Friday.    . hydrOXYzine (ATARAX/VISTARIL) 10 MG tablet TAKE 1 TO 2 TABLETS BY MOUTH AT BEDTIME AS NEEDED FOR ANXIETY/SLEEP 30 tablet 0  . levothyroxine (SYNTHROID) 25 MCG tablet TAKE 1 TABLET BY MOUTH DAILY BEFORE BREAKFAST for thyroid.    . Multiple Vitamins-Minerals (EYE VITAMINS & MINERALS PO) Take 1 tablet by mouth daily. [facility]    . omeprazole (PRILOSEC OTC) 20 MG tablet Take 1 tablet (20 mg total) by mouth daily as needed (reflux/heartburn).     No current facility-administered medications on file prior to visit.    Review of Systems  Constitutional: Negative for activity change, appetite change, fatigue, fever and unexpected weight change.  HENT: Negative for congestion, ear pain, rhinorrhea, sinus pressure and sore throat.   Eyes: Negative for pain, redness and visual disturbance.  Respiratory: Negative for cough, shortness of breath and wheezing.   Cardiovascular: Negative for chest pain and palpitations.  Gastrointestinal: Negative for abdominal pain, blood in stool, constipation and diarrhea.  Endocrine: Negative for  polydipsia and polyuria.  Genitourinary: Negative for dysuria, frequency and urgency.  Musculoskeletal: Positive for arthralgias. Negative for back pain and myalgias.  Skin: Positive for wound. Negative for pallor and rash.  Allergic/Immunologic: Negative for environmental allergies.  Neurological: Negative for dizziness, syncope and headaches.  Hematological: Negative for adenopathy. Does not bruise/bleed easily.  Psychiatric/Behavioral: Negative for decreased concentration and dysphoric mood. The patient is not nervous/anxious.        Objective:   Physical Exam Constitutional:      General: She is not in acute distress.    Appearance: Normal appearance. She is normal weight. She is not ill-appearing.  Eyes:     Conjunctiva/sclera: Conjunctivae normal.     Pupils: Pupils are equal, round, and reactive to  light.  Cardiovascular:     Rate and Rhythm: Normal rate and regular rhythm.     Pulses: Normal pulses.     Comments: Normal pedal pulses  Pulmonary:     Effort: Pulmonary effort is normal. No respiratory distress.  Skin:    General: Skin is warm and dry.     Comments: Patchy areas of ecchymosis on arms and legs Thin skin  No open areas or ulceration  Wound on R ant shin (linear) is scabbed and healing Wound on L lateral leg (also linear and one round) scabbed and healing  Non tender   Neurological:     Mental Status: She is alert.  Psychiatric:     Comments: Baseline dementia  Pleasant and non communicative today           Assessment & Plan:   Problem List Items Addressed This Visit      Musculoskeletal and Integument   Scab - Primary    Several scabs on both legs from prior skin tears  Clean and healing -no open skin or signs of infection  Disc protection from further trauma  Use of non  Stick bandages when needed  inst to avoid adhesive directly on the skin  Dressed with non stick pads and fluffy kerlex today  Pt tolerated well  Will watch for s/s of  infection or ulcer

## 2020-01-03 NOTE — Patient Instructions (Signed)
Keep legs clean  Wrap with fluffy gauze to protect ( a non stick pad over the scabs if needed)  No tape or adhesive on skin  Keep clean with spray or gentle soap and water  Change dressing daily   Watch for any redness or open areas   To moisturize skin - looks for aquaphor (this will help scale and flaky skin)  Keep [name] updated   Protect from trauma as much as you can

## 2020-01-16 ENCOUNTER — Encounter: Payer: Self-pay | Admitting: Family Medicine

## 2020-01-16 NOTE — Telephone Encounter (Signed)
Noted  

## 2020-01-16 NOTE — Telephone Encounter (Signed)
Called and left vm for the patient to call and schedule CPE, Wellness, and labs. EM 01/16/20

## 2020-02-05 ENCOUNTER — Other Ambulatory Visit: Payer: Self-pay | Admitting: Family Medicine

## 2020-02-06 ENCOUNTER — Telehealth: Payer: Self-pay

## 2020-02-06 NOTE — Telephone Encounter (Signed)
Volunteer called patient on behalf of Palliative Care and did not get a answer from patient/family. ° °

## 2020-02-08 ENCOUNTER — Encounter: Payer: Self-pay | Admitting: Family Medicine

## 2020-02-08 ENCOUNTER — Other Ambulatory Visit: Payer: Self-pay

## 2020-02-08 ENCOUNTER — Encounter: Payer: Medicare Other | Attending: Physician Assistant | Admitting: Physician Assistant

## 2020-02-08 ENCOUNTER — Ambulatory Visit (INDEPENDENT_AMBULATORY_CARE_PROVIDER_SITE_OTHER): Payer: Medicare Other | Admitting: Family Medicine

## 2020-02-08 ENCOUNTER — Ambulatory Visit (INDEPENDENT_AMBULATORY_CARE_PROVIDER_SITE_OTHER)
Admission: RE | Admit: 2020-02-08 | Discharge: 2020-02-08 | Disposition: A | Discharge status: Home / Self Care | Payer: Medicare Other | Source: Ambulatory Visit | Attending: Family Medicine | Admitting: Family Medicine

## 2020-02-08 VITALS — BP 118/80 | HR 93 | Temp 98.0°F | Ht 61.0 in | Wt 102.6 lb

## 2020-02-08 DIAGNOSIS — Z7189 Other specified counseling: Secondary | ICD-10-CM | POA: Diagnosis not present

## 2020-02-08 DIAGNOSIS — E46 Unspecified protein-calorie malnutrition: Secondary | ICD-10-CM

## 2020-02-08 DIAGNOSIS — L98492 Non-pressure chronic ulcer of skin of other sites with fat layer exposed: Secondary | ICD-10-CM | POA: Diagnosis not present

## 2020-02-08 DIAGNOSIS — N1832 Chronic kidney disease, stage 3b: Secondary | ICD-10-CM

## 2020-02-08 DIAGNOSIS — I10 Essential (primary) hypertension: Secondary | ICD-10-CM

## 2020-02-08 DIAGNOSIS — R296 Repeated falls: Secondary | ICD-10-CM

## 2020-02-08 DIAGNOSIS — W19XXXA Unspecified fall, initial encounter: Secondary | ICD-10-CM | POA: Insufficient documentation

## 2020-02-08 DIAGNOSIS — R0989 Other specified symptoms and signs involving the circulatory and respiratory systems: Secondary | ICD-10-CM | POA: Diagnosis not present

## 2020-02-08 DIAGNOSIS — S41111A Laceration without foreign body of right upper arm, initial encounter: Secondary | ICD-10-CM | POA: Insufficient documentation

## 2020-02-08 DIAGNOSIS — F0390 Unspecified dementia without behavioral disturbance: Secondary | ICD-10-CM

## 2020-02-08 DIAGNOSIS — S41112A Laceration without foreign body of left upper arm, initial encounter: Secondary | ICD-10-CM | POA: Insufficient documentation

## 2020-02-08 DIAGNOSIS — S51812A Laceration without foreign body of left forearm, initial encounter: Secondary | ICD-10-CM | POA: Insufficient documentation

## 2020-02-08 DIAGNOSIS — Z66 Do not resuscitate: Secondary | ICD-10-CM | POA: Diagnosis not present

## 2020-02-08 DIAGNOSIS — I5032 Chronic diastolic (congestive) heart failure: Secondary | ICD-10-CM | POA: Insufficient documentation

## 2020-02-08 DIAGNOSIS — J449 Chronic obstructive pulmonary disease, unspecified: Secondary | ICD-10-CM | POA: Diagnosis not present

## 2020-02-08 DIAGNOSIS — E039 Hypothyroidism, unspecified: Secondary | ICD-10-CM | POA: Diagnosis not present

## 2020-02-08 DIAGNOSIS — I130 Hypertensive heart and chronic kidney disease with heart failure and stage 1 through stage 4 chronic kidney disease, or unspecified chronic kidney disease: Secondary | ICD-10-CM | POA: Insufficient documentation

## 2020-02-08 DIAGNOSIS — Z0000 Encounter for general adult medical examination without abnormal findings: Secondary | ICD-10-CM

## 2020-02-08 DIAGNOSIS — R531 Weakness: Secondary | ICD-10-CM | POA: Diagnosis not present

## 2020-02-08 DIAGNOSIS — N183 Chronic kidney disease, stage 3 unspecified: Secondary | ICD-10-CM | POA: Insufficient documentation

## 2020-02-08 NOTE — Progress Notes (Signed)
Patient ID: [name], female    DOB: [birth year], 87 y.o.   MRN: [id number]  This visit was conducted in person.  BP 118/80 (BP Location: Right Arm, Patient Position: Sitting, Cuff Size: Normal)   Pulse 93   Temp 98 F (36.7 C) (Temporal)   Ht 5\' 1"  (1.549 m)   Wt 102 lb 9 oz (46.5 kg)   SpO2 98%   BMI 19.38 kg/m   BP Readings from Last 3 Encounters:  02/08/20 118/80  01/03/20 120/64  10/19/19 124/68  No data found.   CC: AMW Subjective:   HPI: [name] is a 87 y.o. female presenting on 02/08/2020 for Medicare Wellness (Pt accompanied by granddaughter, [name]- temp 97.8.)   Did not see health advisor this year.  Pt did not cooperate for hearing/vision today.  No exam data present  [place] Office Visit from 12/26/2018 in [place] at [facility] Total Score 0      Fall Risk  02/08/2020 12/26/2018 12/06/2017 04/20/2016 01/08/2015  Falls in the past year? - 1 0 No Yes  Number falls in past yr: 1 1 - - 1  Comment Most recent, 02/04/20. - - - -  Injury with Fall? 0 1 - - Yes  Comment Skin tear when someone tried to catch pt while falling. - - - -  Risk Factor Category  - - - - High Fall Risk  Risk for fall due to : - - - - -  Risk for fall due to: Comment - - - - -  Follow up - - - - Falls prevention discussed    Decreased appetite noted. Weight loss noted.  Multiple recent falls with resultant tears to skin.  Had another fall on Monday - seeing wound care.  [facility] palliative care involved. [place] daughter notes nose drips constantly.   Preventative: Colonoscopy 03/2010 - 1 polyp, int hemorrhoids. rec f/u with GI PRN. [name])- aged out.  Mammogram - normal 12/2013. Aged out. Declines breast exam.  Well woman - age out. No unexpected bleeding, weight changes. DEXA - unsure. Flu shotyearly COVID vaccine - declined Tetanus 06/2013 Pneumovax and prevnar completed Shingles shot - 2015 Advanced directives: would want HCPOA to  be any of her children. Does not want prolonged life support, does not want feeding tube or breathing tube. DNR confirmed with grand daughter. Discussed advanced directive. Has not set up.  Seat belt use discussed.  Sunscreen use discussed.Denies changing moles on  Non smoker. Alcohol - none  Dentist - has stopped seeing  Eye exam - no longer seeing  Bowel - no constipation. Has had bowel accidents.  Bladder - has had bladder accidents.  Wears depends for both bowel and bladder.   Married, widow 89  Lives with youngest daughter ([name]) and her husband and youngest son, other daughter lives next door Someone stays at home Occupation: retired from [employer] 42yrs Edu: some HS     Relevant past medical, surgical, family and social history reviewed and updated as indicated. Interim medical history since our last visit reviewed. Allergies and medications reviewed and updated. Outpatient Medications Prior to Visit  Medication Sig Dispense Refill  . acetaminophen (TYLENOL) 325 MG tablet Take 2 tablets (650 mg total) by mouth every 6 (six) hours as needed for mild pain or moderate pain (or Fever >/= 101). 80 tablet 0  . amLODipine (NORVASC) 5 MG tablet TAKE 1 TABLET BY MOUTH EVERY DAY IN THE EVENING 90 tablet  0  . Cholecalciferol (VITAMIN D3) 1000 UNITS CAPS Take by mouth 2 (two) times daily.    . Cyanocobalamin (VITAMIN B-12) 500 MCG TABS Take 500 mcg by mouth daily.    . ferrous sulfate 325 (65 FE) MG tablet Take 1 tablet (325 mg total) by mouth every Monday, Wednesday, and Friday.    . hydrOXYzine (ATARAX/VISTARIL) 10 MG tablet TAKE 1 TO 2 TABLETS BY MOUTH AT BEDTIME AS NEEDED FOR ANXIETY/SLEEP 30 tablet 0  . levothyroxine (SYNTHROID) 25 MCG tablet TAKE 1 TABLET BY MOUTH DAILY BEFORE BREAKFAST. 90 tablet 3  . Multiple Vitamins-Minerals (EYE VITAMINS & MINERALS PO) Take 1 tablet by mouth daily. [facility]    . omeprazole (PRILOSEC OTC) 20 MG tablet Take 1 tablet (20 mg total) by mouth  daily as needed (reflux/heartburn).    . furosemide (LASIX) 20 MG tablet TAKE 1/2 TABLET BY MOUTH EVERY MORNING. 45 tablet 0  . furosemide (LASIX) 20 MG tablet Take 0.5 tablets (10 mg total) by mouth daily as needed for fluid or edema.     No facility-administered medications prior to visit.     Per HPI unless specifically indicated in ROS section below Review of Systems Objective:  BP 118/80 (BP Location: Right Arm, Patient Position: Sitting, Cuff Size: Normal)   Pulse 93   Temp 98 F (36.7 C) (Temporal)   Ht 5\' 1"  (1.549 m)   Wt 102 lb 9 oz (46.5 kg)   SpO2 98%   BMI 19.38 kg/m   Wt Readings from Last 3 Encounters:  02/08/20 102 lb 9 oz (46.5 kg)  01/03/20 106 lb 8 oz (48.3 kg)  10/19/19 108 lb 5 oz (49.1 kg)      Physical Exam Vitals and nursing note reviewed.  Constitutional:      Appearance: Normal appearance. She is not ill-appearing.  Cardiovascular:     Rate and Rhythm: Regular rhythm. Tachycardia present.     Pulses: Normal pulses.     Heart sounds: Normal heart sounds. No murmur heard.   Pulmonary:     Effort: Pulmonary effort is normal. No respiratory distress.     Breath sounds: Decreased air movement present. No wheezing, rhonchi or rales.     Comments: coarse rales throughout left lung field Abdominal:     General: Abdomen is flat. There is no distension.     Palpations: Abdomen is soft. There is no mass.     Tenderness: There is no abdominal tenderness. There is no guarding.     Hernia: No hernia is present.  Musculoskeletal:     Right lower leg: No edema.     Left lower leg: No edema.  Skin:    General: Skin is warm and dry.     Findings: No rash.  Neurological:     Mental Status: She is alert.  Psychiatric:        Mood and Affect: Mood normal.        Behavior: Behavior normal.       Results for orders placed or performed during the hospital encounter of 06/14/19  Comprehensive metabolic panel  Result Value Ref Range   Sodium 140 135 - 145  mmol/L   Potassium 3.7 3.5 - 5.1 mmol/L   Chloride 106 98 - 111 mmol/L   CO2 25 22 - 32 mmol/L   Glucose, Bld 101 (H) 70 - 99 mg/dL   BUN 35 (H) 8 - 23 mg/dL   Creatinine, Ser 1.63 (H) 0.44 - 1.00 mg/dL  Calcium 9.3 8.9 - 10.3 mg/dL   Total Protein 5.7 (L) 6.5 - 8.1 g/dL   Albumin 3.0 (L) 3.5 - 5.0 g/dL   AST 15 15 - 41 U/L   ALT 15 0 - 44 U/L   Alkaline Phosphatase 44 38 - 126 U/L   Total Bilirubin 1.3 (H) 0.3 - 1.2 mg/dL   GFR calc non Af Amer 26 (L) >60 mL/min   GFR calc Af Amer 31 (L) >60 mL/min   Anion gap 9 5 - 15  CBC  Result Value Ref Range   WBC 10.8 (H) 4.0 - 10.5 K/uL   RBC 3.73 (L) 3.87 - 5.11 MIL/uL   Hemoglobin 11.7 (L) 12.0 - 15.0 g/dL   HCT 36.9 36.0 - 46.0 %   MCV 98.9 80.0 - 100.0 fL   MCH 31.4 26.0 - 34.0 pg   MCHC 31.7 30.0 - 36.0 g/dL   RDW 12.6 11.5 - 15.5 %   Platelets PLATELET CLUMPS NOTED ON SMEAR, UNABLE TO ESTIMATE 150 - 400 K/uL   nRBC 0.0 0.0 - 0.2 %   Assessment & Plan:  This visit occurred during the SARS-CoV-2 public health emergency.  Safety protocols were in place, including screening questions prior to the visit, additional usage of staff PPE, and extensive cleaning of exam room while observing appropriate contact time as indicated for disinfecting solutions.   Problem List Items Addressed This Visit    Senile dementia ([place])    Continues palliative care.  Weight loss noted, poor appetite.  Consider transition to hospice.       Recurrent falls    Change lasix to PRN. Consider dropping amlodipine.  Discussed trying to limit ambulation/standing at home at this time due to fall risk.  They have wheelchair, walker, raised commode and shower chair at home.       Protein-calorie malnutrition ([place])   Medicare annual wellness visit, subsequent - Primary    I have personally reviewed the Medicare Annual Wellness questionnaire and have noted 1. The patient's medical and social history 2. Their use of alcohol, tobacco or illicit  drugs 3. Their current medications and supplements 4. The patient's functional ability including ADL's, fall risks, home safety risks and hearing or visual impairment. Cognitive function has been assessed and addressed as indicated.  5. Diet and physical activity 6. Evidence for depression or mood disorders The patients weight, height, BMI have been recorded in the chart. I have made referrals, counseling and provided education to the patient based on review of the above and I have provided the pt with a written personalized care plan for preventive services. Provider list updated.. See scanned questionairre as needed for further documentation. Reviewed preventative protocols and updated unless pt declined.       Hypothyroidism    Chronic, mild, stable on low dose levothyroxine.       Relevant Orders   TSH   General weakness   Essential hypertension    BP stable but concern for overtreatment - will ask family to change lasix to 10mg  PRN leg swelling, continue amlodipine 5mg  daily.       Relevant Medications   furosemide (LASIX) 20 MG tablet   DNR (do not resuscitate)    Confirmed with grand daughter [name]      CKD (chronic kidney disease), stage III ([place])    Update labs. They struggle with hydration status.  Ensure using lasix PRN.       Relevant Orders   CBC with Differential  Comprehensive metabolic panel   Chronic diastolic CHF (congestive heart failure) ([facility])    Change lasix to PRN.       Relevant Medications   furosemide (LASIX) 20 MG tablet   Advanced care planning/counseling discussion    Advanced directives: would want HCPOA to be any of her children. Does not want prolonged life support, does not want feeding tube or breathing tube. DNR confirmed with grand daughter. Discussed advanced directive. Has not set up.       Abnormal lung sounds    Abnormal L lung sounds - check CXR today.  Patient is not dyspneic.  Grand daughter endorses intermittent cough.        Relevant Orders   DG Chest 2 View       No orders of the defined types were placed in this encounter.  Orders Placed This Encounter  Procedures  . DG Chest 2 View    Standing Status:   Future    Number of Occurrences:   1    Standing Expiration Date:   02/07/2021    Order Specific Question:   Reason for Exam (SYMPTOM  OR DIAGNOSIS REQUIRED)    Answer:   L abnormal lung exam    Order Specific Question:   Preferred imaging location?    Answer:   [name]  . TSH    Standing Status:   Future    Number of Occurrences:   1    Standing Expiration Date:   02/07/2021  . CBC with Differential    Standing Status:   Future    Number of Occurrences:   1    Standing Expiration Date:   02/07/2021  . Comprehensive metabolic panel    Standing Status:   Future    Number of Occurrences:   1    Standing Expiration Date:   02/07/2021    Order Specific Question:   Release to patient    Answer:   Immediate    Patient instructions: Xray today  Labs today  Try to get a boost or ensure daily, continue offering healthy options for meals.  Hold lasix, only use if leg swelling or shortness of breath develops.   Follow up plan: Return in about 6 months (around 08/07/2020) for follow up visit.  Ria Bush, MD

## 2020-02-08 NOTE — Addendum Note (Signed)
Addended by: Jacob Moores on: 02/08/2020 05:08 PM   Modules accepted: Orders

## 2020-02-08 NOTE — Progress Notes (Addendum)
BRYSTAL, EDHOLM ([id number]) Visit Report for 02/08/2020 Allergy List Details Patient Name: [name], [name] Date of Service: 02/08/2020 9:30 AM Medical Record Number: [id number] Patient Account Number: [id number] Date of Birth/Sex: February 01, 1920 (87 y.o. Female) Treating RN: Dolan Amen Primary Care Callie Bunyard: Ria Bush Other Clinician: Referring Edker Punt: Ria Bush Treating Fenna Semel/Extender: Jeri Cos Weeks in Treatment: 0 Allergies Active Allergies No Known Allergies Type: Allergen Allergy Notes Electronic Signature(s) Signed: 02/08/2020 2:10:41 PM By: Georges Mouse, Minus Breeding RN Entered By: Georges Mouse, Minus Breeding on 02/08/2020 09:47:16 Mcnabb, [name] ([id number]) -------------------------------------------------------------------------------- Arrival Information Details Patient Name: [name] Date of Service: 02/08/2020 9:30 AM Medical Record Number: [id number] Patient Account Number: [id number] Date of Birth/Sex: April 30, 1920 (87 y.o. Female) Treating RN: Dolan Amen Primary Care Raylie Maddison: Ria Bush Other Clinician: Referring Alix Stowers: Ria Bush Treating Travon Crochet/Extender: Skipper Cliche in Treatment: 0 Visit Information Patient Arrived: Wheel Chair Arrival Time: 09:42 Accompanied By: grandaughter Transfer Assistance: EasyPivot Patient Lift Patient Identification Verified: Yes Secondary Verification Process Completed: Yes History Since Last Visit Electronic Signature(s) Signed: 02/08/2020 2:10:41 PM By: Georges Mouse, Minus Breeding RN Entered By: Georges Mouse, Minus Breeding on 02/08/2020 09:44:31 Wiedeman, [name] ([id number]) -------------------------------------------------------------------------------- Encounter Discharge Information Details Patient Name: [name] Date of Service: 02/08/2020 9:30 AM Medical Record Number: [id number] Patient Account Number: [id number] Date of Birth/Sex: 1920-06-26 (87 y.o. Female) Treating  RN: Dolan Amen Primary Care Adaira Centola: Ria Bush Other Clinician: Referring Deedra Pro: Ria Bush Treating Kamiya Acord/Extender: Skipper Cliche in Treatment: 0 Encounter Discharge Information Items Post Procedure Vitals Discharge Condition: Stable Temperature (F): 97.9 Ambulatory Status: Wheelchair Pulse (bpm): 89 Discharge Destination: Home Respiratory Rate (breaths/min): 18 Transportation: Private Auto Blood Pressure (mmHg): 124/84 Accompanied By: grandaughter Schedule Follow-up Appointment: Yes Clinical Summary of Care: Electronic Signature(s) Signed: 02/08/2020 12:12:35 PM By: Georges Mouse, Minus Breeding RN Entered By: Georges Mouse, Minus Breeding on 02/08/2020 12:12:35 [name] ([id number]) -------------------------------------------------------------------------------- Lower Extremity Assessment Details Patient Name: [name] Date of Service: 02/08/2020 9:30 AM Medical Record Number: [id number] Patient Account Number: [id number] Date of Birth/Sex: Jun 09, 1920 (87 y.o. Female) Treating RN: Dolan Amen Primary Care Delight Bickle: Ria Bush Other Clinician: Referring Tiffiney Sparrow: Ria Bush Treating Faviola Klare/Extender: Jeri Cos Weeks in Treatment: 0 Electronic Signature(s) Signed: 02/08/2020 2:10:41 PM By: Georges Mouse, Minus Breeding RN Entered By: Georges Mouse, Minus Breeding on 02/08/2020 10:12:48 [name] ([id number]) -------------------------------------------------------------------------------- Multi Wound Chart Details Patient Name: [name] Date of Service: 02/08/2020 9:30 AM Medical Record Number: [id number] Patient Account Number: [id number] Date of Birth/Sex: 24-Mar-1920 (87 y.o. Female) Treating RN: Dolan Amen Primary Care Kaisley Stiverson: Ria Bush Other Clinician: Referring Blue Winther: Ria Bush Treating Saory Carriero/Extender: Skipper Cliche in Treatment: 0 Vital Signs Height(in): 19 Pulse(bpm):  77 Weight(lbs): Blood Pressure(mmHg): 124/84 Body Mass Index(BMI): Temperature(F): 97.9 Respiratory Rate(breaths/min): 18 Photos: [10:No Photos] [11:No Photos] [12:No Photos] Wound Location: [10:Right Upper Arm] [11:Left Upper Arm] [12:Left Forearm] Wounding Event: [10:Skin Tear/Laceration] [11:Skin Tear/Laceration] [12:Skin Tear/Laceration] Primary Etiology: [10:Skin Tear] [11:Skin Tear] [12:Skin Tear] Comorbid History: [10:Congestive Heart Failure] [11:Congestive Heart Failure] [12:Congestive Heart Failure] Date Acquired: [10:02/04/2020] [11:02/04/2020] [12:02/04/2020] Weeks of Treatment: [10:0] [11:0] [12:0] Wound Status: [10:Open] [11:Open] [12:Open] Clustered Wound: [10:Yes] [11:Yes] [12:No] Clustered Quantity: [10:2] [11:2] [12:N/A] Measurements L x W x D (cm) [10:13x7x0.1] [11:7x3x0.1] [12:0.3x3x0.1] Area (cm) : [10:71.471] [id number] [12:0.707] Volume (cm) : [10:7.147] [11:1.649] [12:0.071] % Reduction in Area: [10:0.00%] [11:0.00%] [12:0.00%] % Reduction in Volume: [10:0.00%] [11:0.00%] [12:0.00%] Classification: [10:Full Thickness Without Exposed Support Structures] [11:Full Thickness Without Exposed Support Structures] [12:Partial Thickness] Exudate Amount: [10:Medium] [11:Medium] [12:Small] Exudate Type: [10:Sanguinous] [  11:Sanguinous] [12:Sanguinous] Exudate Color: [10:red] [11:red] [12:red] Granulation Amount: [10:Large (67-100%)] [11:Large (67-100%)] [12:Medium (34-66%)] Granulation Quality: [10:Red] [11:Red] [12:Red] Necrotic Amount: [10:None Present (0%)] [11:None Present (0%)] [12:Small (1-33%)] Exposed Structures: [10:Fat Layer (Subcutaneous Tissue): Yes Fascia: No Tendon: No Muscle: No Joint: No Bone: No None] [11:Fat Layer (Subcutaneous Tissue): Yes Fascia: No Tendon: No Muscle: No Joint: No Bone: No None] [12:Fat Layer (Subcutaneous Tissue): Yes Fascia: No  Tendon: No Muscle: No Joint: No Bone: No Medium (34-66%)] Treatment Notes Electronic  Signature(s) Signed: 02/08/2020 2:10:41 PM By: Georges Mouse, Minus Breeding RN Entered By: Georges Mouse, Minus Breeding on 02/08/2020 10:40:07 [name] ([id number]) -------------------------------------------------------------------------------- Stebbins Details Patient Name: [name] Date of Service: 02/08/2020 9:30 AM Medical Record Number: [id number] Patient Account Number: [id number] Date of Birth/Sex: [birth year] (87 y.o. Female) Treating RN: Dolan Amen Primary Care Breslin Hemann: Ria Bush Other Clinician: Referring Johnross Nabozny: Ria Bush Treating Lurena Naeve/Extender: Skipper Cliche in Treatment: 0 Active Inactive Electronic Signature(s) Signed: 03/28/2020 4:07:19 PM By: Gretta Cool, BSN, RN, CWS, Kim RN, BSN Signed: 05/02/2020 11:57:16 AM By: Charlett Nose RN Previous Signature: 02/08/2020 2:10:41 PM Version By: Georges Mouse, Minus Breeding RN Entered By: Gretta Cool, BSN, RN, CWS, Kim on 03/28/2020 16:07:19 [place], [name] ([id number]) -------------------------------------------------------------------------------- Pain Assessment Details Patient Name: [name] Date of Service: 02/08/2020 9:30 AM Medical Record Number: [id number] Patient Account Number: [id number] Date of Birth/Sex: 11-Dec-1920 (87 y.o. Female) Treating RN: Dolan Amen Primary Care Briele Lagasse: Ria Bush Other Clinician: Referring Creg Gilmer: Ria Bush Treating Derisha Funderburke/Extender: Skipper Cliche in Treatment: 0 Active Problems Location of Pain Severity and Description of Pain Patient Has Paino No Site Locations Rate the pain. Current Pain Level: 0 Pain Management and Medication Current Pain Management: Electronic Signature(s) Signed: 02/08/2020 2:10:41 PM By: Georges Mouse, Minus Breeding RN Entered By: Georges Mouse, Kenia on 02/08/2020 09:44:38 Pichardo, [name]  ([id number]) -------------------------------------------------------------------------------- Patient/Caregiver Education Details Patient Name: [name] Date of Service: 02/08/2020 9:30 AM Medical Record Number: [id number] Patient Account Number: [id number] Date of Birth/Gender: [birth year] (87 y.o. Female) Treating RN: Dolan Amen Primary Care Physician: Ria Bush Other Clinician: Referring Physician: Ria Bush Treating Physician/Extender: Skipper Cliche in Treatment: 0 Education Assessment Education Provided To: Patient and Caregiver granddaughter Education Topics Provided Wound Debridement: Methods: Explain/Verbal Responses: State content correctly Wound/Skin Impairment: Methods: Explain/Verbal Responses: State content correctly Electronic Signature(s) Signed: 02/08/2020 2:10:41 PM By: Georges Mouse, Minus Breeding RN Entered By: Georges Mouse, Minus Breeding on 02/08/2020 10:50:13 Stotz, [name] ([id number]) -------------------------------------------------------------------------------- Wound Assessment Details Patient Name: [name] Date of Service: 02/08/2020 9:30 AM Medical Record Number: [id number] Patient Account Number: [id number] Date of Birth/Sex: [birth year] (87 y.o. Female) Treating RN: Dolan Amen Primary Care Bertine Schlottman: Ria Bush Other Clinician: Referring Elohim Brune: Ria Bush Treating Jourdyn Ferrin/Extender: Jeri Cos Weeks in Treatment: 0 Wound Status Wound Number: 10 Primary Etiology: Skin Tear Wound Location: Right Upper Arm Wound Status: Open Wounding Event: Skin Tear/Laceration Comorbid History: Congestive Heart Failure Date Acquired: 02/04/2020 Weeks Of Treatment: 0 Clustered Wound: Yes Photos Photo Uploaded By: Georges Mouse, Minus Breeding on 02/08/2020 16:14:10 Wound Measurements Length: (cm) 13 Width: (cm) 7 Depth: (cm) 0.1 Clustered Quantity: 2 Area: (cm) 71.471 Volume: (cm) 7.147 % Reduction in Area:  0% % Reduction in Volume: 0% Epithelialization: None Tunneling: No Undermining: No Wound Description Classification: Full Thickness Without Exposed Support Structu Exudate Amount: Medium Exudate Type: Sanguinous Exudate Color: red res Foul Odor After Cleansing: No Slough/Fibrino No Wound Bed Granulation Amount: Large (67-100%) Exposed Structure Granulation Quality: Red Fascia Exposed: No Necrotic  Amount: None Present (0%) Fat Layer (Subcutaneous Tissue) Exposed: Yes Tendon Exposed: No Muscle Exposed: No Joint Exposed: No Bone Exposed: No Electronic Signature(s) Signed: 02/08/2020 2:10:41 PM By: Georges Mouse, Minus Breeding RN Entered By: Georges Mouse, Minus Breeding on 02/08/2020 10:03:28 [name] ([id number]) -------------------------------------------------------------------------------- Wound Assessment Details Patient Name: [name] Date of Service: 02/08/2020 9:30 AM Medical Record Number: [id number] Patient Account Number: [id number] Date of Birth/Sex: [birth year] (87 y.o. Female) Treating RN: Dolan Amen Primary Care Syesha Thaw: Ria Bush Other Clinician: Referring Anysa Tacey: Ria Bush Treating Niasha Devins/Extender: Jeri Cos Weeks in Treatment: 0 Wound Status Wound Number: 11 Primary Etiology: Skin Tear Wound Location: Left Upper Arm Wound Status: Open Wounding Event: Skin Tear/Laceration Comorbid History: Congestive Heart Failure Date Acquired: 02/04/2020 Weeks Of Treatment: 0 Clustered Wound: Yes Photos Photo Uploaded By: Georges Mouse, Minus Breeding on 02/08/2020 16:14:10 Wound Measurements Length: (cm) 7 Width: (cm) 3 Depth: (cm) 0.1 Clustered Quantity: 2 Area: (cm) 16.493 Volume: (cm) 1.649 % Reduction in Area: 0% % Reduction in Volume: 0% Epithelialization: None Tunneling: No Undermining: No Wound Description Classification: Full Thickness Without Exposed Support Structu Exudate Amount: Medium Exudate Type: Sanguinous Exudate  Color: red res Foul Odor After Cleansing: No Slough/Fibrino No Wound Bed Granulation Amount: Large (67-100%) Exposed Structure Granulation Quality: Red Fascia Exposed: No Necrotic Amount: None Present (0%) Fat Layer (Subcutaneous Tissue) Exposed: Yes Tendon Exposed: No Muscle Exposed: No Joint Exposed: No Bone Exposed: No Electronic Signature(s) Signed: 02/08/2020 2:10:41 PM By: Georges Mouse, Minus Breeding RN Entered By: Georges Mouse, Minus Breeding on 02/08/2020 10:09:04 [name] ([id number]) -------------------------------------------------------------------------------- Wound Assessment Details Patient Name: [name] Date of Service: 02/08/2020 9:30 AM Medical Record Number: [id number] Patient Account Number: [id number] Date of Birth/Sex: 11/25/1920 (87 y.o. Female) Treating RN: Dolan Amen Primary Care Lucky Trotta: Ria Bush Other Clinician: Referring Taiwan Talcott: Ria Bush Treating Alexis Reber/Extender: Jeri Cos Weeks in Treatment: 0 Wound Status Wound Number: 12 Primary Etiology: Skin Tear Wound Location: Left Forearm Wound Status: Open Wounding Event: Skin Tear/Laceration Comorbid History: Congestive Heart Failure Date Acquired: 02/04/2020 Weeks Of Treatment: 0 Clustered Wound: No Photos Photo Uploaded By: Georges Mouse, Minus Breeding on 02/08/2020 16:14:38 Wound Measurements Length: (cm) 0.3 Width: (cm) 3 Depth: (cm) 0.1 Area: (cm) 0.707 Volume: (cm) 0.071 % Reduction in Area: 0% % Reduction in Volume: 0% Epithelialization: Medium (34-66%) Tunneling: No Undermining: No Wound Description Classification: Partial Thickness Exudate Amount: Small Exudate Type: Sanguinous Exudate Color: red Foul Odor After Cleansing: No Slough/Fibrino Yes Wound Bed Granulation Amount: Medium (34-66%) Exposed Structure Granulation Quality: Red Fascia Exposed: No Necrotic Amount: Small (1-33%) Fat Layer (Subcutaneous Tissue) Exposed: Yes Necrotic Quality:  Adherent Slough Tendon Exposed: No Muscle Exposed: No Joint Exposed: No Bone Exposed: No Electronic Signature(s) Signed: 02/08/2020 2:10:41 PM By: Georges Mouse, Minus Breeding RN Entered By: Georges Mouse, Minus Breeding on 02/08/2020 10:12:21 [name] ([id number]) -------------------------------------------------------------------------------- Vitals Details Patient Name: [name] Date of Service: 02/08/2020 9:30 AM Medical Record Number: [id number] Patient Account Number: [id number] Date of Birth/Sex: [birth year] (87 y.o. Female) Treating RN: Dolan Amen Primary Care Mickel Schreur: Ria Bush Other Clinician: Referring Janecia Palau: Ria Bush Treating Dessiree Sze/Extender: Skipper Cliche in Treatment: 0 Vital Signs Time Taken: 09:44 Temperature (F): 97.9 Height (in): 62 Pulse (bpm): 89 Source: Stated Respiratory Rate (breaths/min): 18 Blood Pressure (mmHg): 124/84 Reference Range: 80 - 120 mg / dl Electronic Signature(s) Signed: 02/08/2020 2:10:41 PM By: Georges Mouse, Minus Breeding RN Entered By: Georges Mouse, Minus Breeding on 02/08/2020 09:47:09

## 2020-02-08 NOTE — Assessment & Plan Note (Signed)

## 2020-02-08 NOTE — Assessment & Plan Note (Signed)
Confirmed with grand daughter Kenney Houseman

## 2020-02-08 NOTE — Assessment & Plan Note (Addendum)
BP stable but concern for overtreatment - will ask family to change lasix to 10mg  PRN leg swelling, continue amlodipine 5mg  daily.

## 2020-02-08 NOTE — Assessment & Plan Note (Signed)
Abnormal L lung sounds - check CXR today.  Patient is not dyspneic.  Grand daughter endorses intermittent cough.

## 2020-02-08 NOTE — Assessment & Plan Note (Signed)
Change lasix to PRN

## 2020-02-08 NOTE — Assessment & Plan Note (Signed)
Chronic, mild, stable on low dose levothyroxine.

## 2020-02-08 NOTE — Assessment & Plan Note (Signed)
Continues palliative care.  Weight loss noted, poor appetite.  Consider transition to hospice.

## 2020-02-08 NOTE — Assessment & Plan Note (Signed)
Advanced directives: would want HCPOA to be any of her children. Does not want prolonged life support, does not want feeding tube or breathing tube. DNR confirmed with grand daughter. Discussed advanced directive. Has not set up.

## 2020-02-08 NOTE — Assessment & Plan Note (Signed)
Update labs. They struggle with hydration status.  Ensure using lasix PRN.

## 2020-02-08 NOTE — Assessment & Plan Note (Signed)
Change lasix to PRN. Consider dropping amlodipine.  Discussed trying to limit ambulation/standing at home at this time due to fall risk.  They have wheelchair, walker, raised commode and shower chair at home.

## 2020-02-08 NOTE — Patient Instructions (Addendum)
Xray today  Labs today  Try to get a boost or ensure daily, continue offering healthy options for meals as well as regular water intake to avoid dehydration.  Hold lasix, only use if leg swelling or shortness of breath develops.   Health Maintenance After Age 85 After age 35, you are at a higher risk for certain long-term diseases and infections as well as injuries from falls. Falls are a major cause of broken bones and head injuries in people who are older than age 3. Getting regular preventive care can help to keep you healthy and well. Preventive care includes getting regular testing and making lifestyle changes as recommended by your health care provider. Talk with your health care provider about:  Which screenings and tests you should have. A screening is a test that checks for a disease when you have no symptoms.  A diet and exercise plan that is right for you. What should I know about screenings and tests to prevent falls? Screening and testing are the best ways to find a health problem early. Early diagnosis and treatment give you the best chance of managing medical conditions that are common after age 77. Certain conditions and lifestyle choices may make you more likely to have a fall. Your health care provider may recommend:  Regular vision checks. Poor vision and conditions such as cataracts can make you more likely to have a fall. If you wear glasses, make sure to get your prescription updated if your vision changes.  Medicine review. Work with your health care provider to regularly review all of the medicines you are taking, including over-the-counter medicines. Ask your health care provider about any side effects that may make you more likely to have a fall. Tell your health care provider if any medicines that you take make you feel dizzy or sleepy.  Osteoporosis screening. Osteoporosis is a condition that causes the bones to get weaker. This can make the bones weak and cause them to  break more easily.  Blood pressure screening. Blood pressure changes and medicines to control blood pressure can make you feel dizzy.  Strength and balance checks. Your health care provider may recommend certain tests to check your strength and balance while standing, walking, or changing positions.  Foot health exam. Foot pain and numbness, as well as not wearing proper footwear, can make you more likely to have a fall.  Depression screening. You may be more likely to have a fall if you have a fear of falling, feel emotionally low, or feel unable to do activities that you used to do.  Alcohol use screening. Using too much alcohol can affect your balance and may make you more likely to have a fall. What actions can I take to lower my risk of falls? General instructions  Talk with your health care provider about your risks for falling. Tell your health care provider if: ? You fall. Be sure to tell your health care provider about all falls, even ones that seem minor. ? You feel dizzy, sleepy, or off-balance.  Take over-the-counter and prescription medicines only as told by your health care provider. These include any supplements.  Eat a healthy diet and maintain a healthy weight. A healthy diet includes low-fat dairy products, low-fat (lean) meats, and fiber from whole grains, beans, and lots of fruits and vegetables. Home safety  Remove any tripping hazards, such as rugs, cords, and clutter.  Install safety equipment such as grab bars in bathrooms and safety rails on stairs.  Keep rooms and walkways well-lit. Activity  Follow a regular exercise program to stay fit. This will help you maintain your balance. Ask your health care provider what types of exercise are appropriate for you.  If you need a cane or walker, use it as recommended by your health care provider.  Wear supportive shoes that have nonskid soles.   Lifestyle  Do not drink alcohol if your health care provider tells  you not to drink.  If you drink alcohol, limit how much you have: ? 0-1 drink a day for women. ? 0-2 drinks a day for men.  Be aware of how much alcohol is in your drink. In the [place] one drink equals one typical bottle of beer (12 oz), one-half glass of wine (5 oz), or one shot of hard liquor (1 oz).  Do not use any products that contain nicotine or tobacco, such as cigarettes and e-cigarettes. If you need help quitting, ask your health care provider. Summary  Having a healthy lifestyle and getting preventive care can help to protect your health and wellness after age 23.  Screening and testing are the best way to find a health problem early and help you avoid having a fall. Early diagnosis and treatment give you the best chance for managing medical conditions that are more common for people who are older than age 6.  Falls are a major cause of broken bones and head injuries in people who are older than age 65. Take precautions to prevent a fall at home.  Work with your health care provider to learn what changes you can make to improve your health and wellness and to prevent falls. This information is not intended to replace advice given to you by your health care provider. Make sure you discuss any questions you have with your health care provider. Document Revised: 05/04/2018 Document Reviewed: 11/24/2016 Elsevier Patient Education  2021 [employer].

## 2020-02-08 NOTE — Progress Notes (Signed)
KRISTIANE, MORSCH ([id number]) Visit Report for 02/08/2020 Abuse/Suicide Risk Screen Details Patient Name: [name], [name] Date of Service: 02/08/2020 9:30 AM Medical Record Number: [id number] Patient Account Number: [id number] Date of Birth/Sex: 09-07-1920 (87 y.o. F) Treating RN: Dolan Amen Primary Care Ellice Boultinghouse: Ria Bush Other Clinician: Referring Mayline Dragon: Ria Bush Treating Davone Shinault/Extender: Skipper Cliche in Treatment: 0 Abuse/Suicide Risk Screen Items Answer ABUSE RISK SCREEN: Has anyone close to you tried to hurt or harm you recentlyo No Do you feel uncomfortable with anyone in your familyo No Has anyone forced you do things that you didnot want to doo No Electronic Signature(s) Signed: 02/08/2020 2:10:41 PM By: Georges Mouse, Minus Breeding RN Entered By: Georges Mouse, Minus Breeding on 02/08/2020 09:48:34 Sieler, [name] ([id number]) -------------------------------------------------------------------------------- Activities of Daily Living Details Patient Name: [name] Date of Service: 02/08/2020 9:30 AM Medical Record Number: [id number] Patient Account Number: [id number] Date of Birth/Sex: [birth year] (87 y.o. F) Treating RN: Dolan Amen Primary Care Avish Torry: Ria Bush Other Clinician: Referring Brinlyn Cena: Ria Bush Treating Mylynn Dinh/Extender: Skipper Cliche in Treatment: 0 Activities of Daily Living Items Answer Activities of Daily Living (Please select one for each item) Drive Automobile Need Assistance Take Medications Need Assistance Use Telephone Need Assistance Care for Appearance Need Assistance Use Toilet Need Assistance Bath / Shower Need Assistance Dress Self Need Assistance Feed Self Need Assistance Walk Need Assistance Get In / Out Bed Need Assistance Housework Need Assistance Prepare Meals Need Assistance Handle Money Need Assistance Shop for Self Need Assistance Electronic Signature(s) Signed: 02/08/2020  2:10:41 PM By: Charlett Nose RN Entered By: Georges Mouse, Minus Breeding on 02/08/2020 09:48:57 [name] ([id number]) -------------------------------------------------------------------------------- Education Screening Details Patient Name: [name] Date of Service: 02/08/2020 9:30 AM Medical Record Number: [id number] Patient Account Number: [id number] Date of Birth/Sex: 12/19/1920 (87 y.o. F) Treating RN: Dolan Amen Primary Care Caeson Filippi: Ria Bush Other Clinician: Referring Donnah Levert: Ria Bush Treating Sharanda Shinault/Extender: Skipper Cliche in Treatment: 0 Primary Learner Assessed: Patient Learning Preferences/Education Level/Primary Language Learning Preference: Explanation, Demonstration Highest Education Level: Grade School Preferred Language: English Cognitive Barrier Language Barrier: No Translator Needed: No Memory Deficit: No Emotional Barrier: No Cultural/Religious Beliefs Affecting Medical Care: No Physical Barrier Impaired Vision: No Impaired Hearing: Yes Decreased Hand dexterity: No Knowledge/Comprehension Knowledge Level: Medium Comprehension Level: Medium Ability to understand written instructions: Medium Ability to understand verbal instructions: Medium Motivation Anxiety Level: Calm Cooperation: Cooperative Education Importance: Acknowledges Need Interest in Health Problems: Asks Questions Perception: Coherent Willingness to Engage in Self-Management Medium Activities: Readiness to Engage in Self-Management Medium Activities: Electronic Signature(s) Signed: 02/08/2020 2:10:41 PM By: Georges Mouse, Minus Breeding RN Entered By: Georges Mouse, Minus Breeding on 02/08/2020 09:50:26 Barbaro, [name] ([id number]) -------------------------------------------------------------------------------- Fall Risk Assessment Details Patient Name: [name] Date of Service: 02/08/2020 9:30 AM Medical Record Number: [id number] Patient  Account Number: [id number] Date of Birth/Sex: 08-04-1920 (87 y.o. F) Treating RN: Dolan Amen Primary Care Walther Sanagustin: Ria Bush Other Clinician: Referring Lismary Kiehn: Ria Bush Treating Angelice Piech/Extender: Skipper Cliche in Treatment: 0 Fall Risk Assessment Items Have you had 2 or more falls in the last 12 monthso 0 Yes Have you had any fall that resulted in injury in the last 12 monthso 0 Yes FALLS RISK SCREEN History of falling - immediate or within 3 months 25 Yes Secondary diagnosis (Do you have 2 or more medical diagnoseso) 15 Yes Ambulatory aid None/bed rest/wheelchair/nurse 0 No Crutches/cane/walker 15 Yes Furniture 0 No Intravenous therapy Access/Saline/Heparin Lock 0 No Gait/Transferring Normal/ bed rest/ wheelchair  0 No Weak (short steps with or without shuffle, stooped but able to lift head while walking, may 10 Yes seek support from furniture) Impaired (short steps with shuffle, may have difficulty arising from chair, head down, impaired 0 No balance) Mental Status Oriented to own ability 0 Yes Electronic Signature(s) Signed: 02/08/2020 2:10:41 PM By: Georges Mouse, Minus Breeding RN Entered By: Georges Mouse, Kenia on 02/08/2020 09:50:50 Stanforth, [name] ([id number]) -------------------------------------------------------------------------------- Foot Assessment Details Patient Name: [name] Date of Service: 02/08/2020 9:30 AM Medical Record Number: [id number] Patient Account Number: [id number] Date of Birth/Sex: 25-Jun-1920 (87 y.o. F) Treating RN: Dolan Amen Primary Care Marne Meline: Ria Bush Other Clinician: Referring Yvan Dority: Ria Bush Treating Zeplin Aleshire/Extender: Skipper Cliche in Treatment: 0 Foot Assessment Items Site Locations + = Sensation present, - = Sensation absent, C = Callus, U = Ulcer R = Redness, W = Warmth, M = Maceration, PU = Pre-ulcerative lesion F = Fissure, S = Swelling, D =  Dryness Assessment Right: Left: Other Deformity: No No Prior Foot Ulcer: No No Prior Amputation: No No Charcot Joint: No No Ambulatory Status: Ambulatory With Help Assistance Device: Walker Gait: Administrator, arts) Signed: 02/08/2020 2:10:41 PM By: Georges Mouse, Minus Breeding RN Entered By: Georges Mouse, Minus Breeding on 02/08/2020 09:51:35 Karel, [name] ([id number]) -------------------------------------------------------------------------------- Nutrition Risk Screening Details Patient Name: [name] Date of Service: 02/08/2020 9:30 AM Medical Record Number: [id number] Patient Account Number: [id number] Date of Birth/Sex: 1920-09-14 (87 y.o. F) Treating RN: Dolan Amen Primary Care Faiz Weber: Ria Bush Other Clinician: Referring Shakeitha Umbaugh: Ria Bush Treating Emmy Keng/Extender: Jeri Cos Weeks in Treatment: 0 Height (in): 62 Weight (lbs): Body Mass Index (BMI): Nutrition Risk Screening Items Score Screening NUTRITION RISK SCREEN: I have an illness or condition that made me change the kind and/or amount of food I eat 0 No I eat fewer than two meals per day 0 No I eat few fruits and vegetables, or milk products 2 Yes I have three or more drinks of beer, liquor or wine almost every day 0 No I have tooth or mouth problems that make it hard for me to eat 2 Yes I don't always have enough money to buy the food I need 0 No I eat alone most of the time 0 No I take three or more different prescribed or over-the-counter drugs a day 0 No Without wanting to, I have lost or gained 10 pounds in the last six months 0 No I am not always physically able to shop, cook and/or feed myself 0 No Nutrition Protocols Good Risk Protocol Moderate Risk Protocol 0 Provide education on nutrition High Risk Proctocol Risk Level: Moderate Risk Score: 4 Electronic Signature(s) Signed: 02/08/2020 2:10:41 PM By: Georges Mouse, Minus Breeding RN Entered By: Georges Mouse, Minus Breeding  on 02/08/2020 09:51:24

## 2020-02-08 NOTE — Progress Notes (Signed)
PHEOBE, SANDIFORD ([id number]) Visit Report for 02/08/2020 Chief Complaint Document Details Patient Name: [name], [name] Date of Service: 02/08/2020 9:30 AM Medical Record Number: [id number] Patient Account Number: [id number] Date of Birth/Sex: 1921-01-01 (87 y.o. F) Treating RN: Cornell Barman Primary Care Provider: Ria Bush Other Clinician: Referring Provider: Ria Bush Treating Provider/Extender: Skipper Cliche in Treatment: 0 Information Obtained from: Patient Chief Complaint Skin tear on the left forearm and right lower leg. Electronic Signature(s) Signed: 02/08/2020 10:15:36 AM By: Worthy Keeler PA-C Entered By: Worthy Keeler on 02/08/2020 10:15:36 Oglesby, [name] ([id number]) -------------------------------------------------------------------------------- Debridement Details Patient Name: [name] Date of Service: 02/08/2020 9:30 AM Medical Record Number: [id number] Patient Account Number: [id number] Date of Birth/Sex: 1921-01-15 (87 y.o. F) Treating RN: Dolan Amen Primary Care Provider: Ria Bush Other Clinician: Referring Provider: Ria Bush Treating Provider/Extender: Skipper Cliche in Treatment: 0 Debridement Performed for Wound #10 Right Upper Arm Assessment: Performed By: Physician Tommie Sams., PA-C Debridement Type: Debridement Level of Consciousness (Pre- Awake and Alert procedure): Pre-procedure Verification/Time Out Yes - 10:30 Taken: Start Time: 10:30 Total Area Debrided (L x W): 7 (cm) x 4 (cm) = 28 (cm) Tissue and other material Non-Viable, Skin: Epidermis debrided: Level: Skin/Epidermis Debridement Description: Selective/Open Wound Instrument: Forceps, Scissors Bleeding: Minimum Hemostasis Achieved: Pressure End Time: 10:40 Response to Treatment: Procedure was tolerated well Level of Consciousness (Post- Awake and Alert procedure): Post Debridement Measurements of Total Wound Length:  (cm) 13 Width: (cm) 7 Depth: (cm) 0.1 Volume: (cm) 7.147 Character of Wound/Ulcer Post Debridement: Stable Post Procedure Diagnosis Same as Pre-procedure Electronic Signature(s) Signed: 02/08/2020 1:43:02 PM By: Worthy Keeler PA-C Signed: 02/08/2020 2:10:41 PM By: Georges Mouse, Minus Breeding RN Entered By: Georges Mouse, Minus Breeding on 02/08/2020 10:42:58 [name], [name] ([id number]) -------------------------------------------------------------------------------- Debridement Details Patient Name: [name] Date of Service: 02/08/2020 9:30 AM Medical Record Number: [id number] Patient Account Number: [id number] Date of Birth/Sex: 1920/05/29 (87 y.o. F) Treating RN: Dolan Amen Primary Care Provider: Ria Bush Other Clinician: Referring Provider: Ria Bush Treating Provider/Extender: Skipper Cliche in Treatment: 0 Debridement Performed for Wound #11 Left Upper Arm Assessment: Performed By: Physician Tommie Sams., PA-C Debridement Type: Debridement Level of Consciousness (Pre- Awake and Alert procedure): Pre-procedure Verification/Time Out Yes - 10:30 Taken: Start Time: 10:30 Total Area Debrided (L x W): 3 (cm) x 3 (cm) = 9 (cm) Tissue and other material Non-Viable, Skin: Epidermis debrided: Level: Skin/Epidermis Debridement Description: Selective/Open Wound Instrument: Forceps, Scissors Bleeding: Minimum Hemostasis Achieved: Pressure End Time: 10:40 Response to Treatment: Procedure was tolerated well Level of Consciousness (Post- Awake and Alert procedure): Post Debridement Measurements of Total Wound Length: (cm) 7 Width: (cm) 3 Depth: (cm) 0.1 Volume: (cm) 1.649 Character of Wound/Ulcer Post Debridement: Stable Post Procedure Diagnosis Same as Pre-procedure Electronic Signature(s) Signed: 02/08/2020 1:43:02 PM By: Worthy Keeler PA-C Signed: 02/08/2020 2:10:41 PM By: Georges Mouse, Minus Breeding RN Entered By: Georges Mouse, Minus Breeding on  02/08/2020 10:43:41 [name], [name] ([id number]) -------------------------------------------------------------------------------- HPI Details Patient Name: [name] Date of Service: 02/08/2020 9:30 AM Medical Record Number: [id number] Patient Account Number: [id number] Date of Birth/Sex: 06/12/1920 (87 y.o. F) Treating RN: Cornell Barman Primary Care Provider: Ria Bush Other Clinician: Referring Provider: Ria Bush Treating Provider/Extender: Skipper Cliche in Treatment: 0 History of Present Illness HPI Description: 12/15/2018 on evaluation today patient presents for initial evaluation here in our clinic concerning issues that she has been having at multiple locations with skin tears. She had a fall  roughly 2 weeks ago where she injured her left upper arm and forearm at that time. Subsequently she ended up as well with a skin tear to her leg when her family members were trying to get her into the bed her skin is so fragile that as she was lifting her legs the skin on the back of the leg tore. That is on the right. Nonetheless they have been trying to manage these I did see her primary care provider earlier in the week who referred her to [name] for further evaluation and treatment. Currently there does not appear to be any signs of active infection at this time which is good news at any site. No fevers, chills, nausea, vomiting, or diarrhea. She does have pain but this is mainly on the leg ulcer the arms are really not to painful at this point. The patient does have a history of dementia, hypertension, congestive heart failure, and chronic kidney disease stage III. 12/29/2018 on evaluation today patient appears to be doing better in regard to her skin tears on the forearm both are healing quite nicely one is close the other is slightly hyper granulated which is probably preventing complete epithelization. With regard to her right lower extremity unfortunately she is still  having a lot of dressing material/alginate which is stuck to the wound bed. This seems to be a result of unfortunately having a different type of alginate than what we actually ordered for her. We had ordered silver cell as this was more nonstick unfortunately she received a different alginate which is breaking down and getting somewhat matted into the wound. In fact I would have to try to get some of this off today if at all possible. Other than that she seems to actually be healing nicely she has much less drainage than previously noted. 01/02/19 on evaluation today patient appears to be doing well with regard to her left forearm and right lower extremity ulcers. The left forearm appears to be completely healed which is great news the right leg is doing much better that there's still some eschar/dressing material stuck to the wound it appears to be healing underneath quite well. The Xeroform did much better for her. 01/09/2019 on evaluation today patient appears to be doing very well with regard to her right lower extremity ulcer. She has been tolerating the dressing changes without complication. Unfortunately she still has a lot of the alginate dressing stuck to the wound bed that somewhat mad again. It looks like there may be some healing underneath some of these areas but nonetheless I feel like we really need to try to remove that today. This was discussed with the patient's daughter as well. The good news is the patient is really not having a lot of pain. 01/16/2019 upon evaluation today patient appears to be doing well with regard to her wound. In fact this is measuring quite a bit smaller and overall I am very pleased with how things seem to be progressing. The patient seems to be doing well although she does have dementia her family member who is with her today is very pleased with how things seem to be progressing. 01/30/2019 on evaluation today patient appears to be doing excellent in regard  to her ulcer. She has been tolerating the dressing changes without complication. Fortunately there is no signs of anything remaining open at this point which is excellent.  Readmission:06/26/2019 upon evaluation today patient presents for reevaluation here in the clinic concerning issues unfortunately that she  has with new wounds to her left forearm, left upper arm, right lower extremity, and left lower extremity. She had 2 different falls which actually resulted in the skin tears as noted here. Fortunately there is no evidence of active infection. I previously have seen her for skin tears at that time as well. She responded extremely well with Xeroform at that point. Alginate did not do well at all for her in fact the alginate that the home health company used at that time got stuck into her wound on the leg and we had to have a fairly significant debridement to remove all this that definitely do not want to go that way again. I completely understand. Nonetheless I think she responded so well to the Xeroform at that time that would be my go to dressing of choice at this point as well in fact this point they have been using it already some of the wounds seem to be doing better. Her left forearm is actually the worst of all the wounds. 07/02/2019 upon evaluation today patient appears to be doing better in regard to most of her original wounds that she had upon last evaluation. Unfortunately the left elbow is a new skin tear that appears to be at least a day old may be 2 but again the skin has not reattached to the point that I think we cannot try to stretch this out and see if we get it reattached. This is not a fresh wound does not right here in the office is not bleeding enough for that and I showed that to the patient's family member as well as he question whether or not getting her into the chair because the skin tear. Again I do not believe there is any way possible that is the case with the way its  not actively bleeding at this time. Again I explained that even pulling the skin over there is no guarantee that we can be able to get this to completely reattached but we will give it our best. 07/10/2019 upon evaluation today patient actually appears to be doing much better and a lot of her wounds are actually improving quite significantly which is great news. There does not appear to be any evidence of active infection which is also great news. Overall I am extremely pleased with how she is doing. She does have a new skin tear however which occurred sometime over the weekend according to her family member who is with her today. Nonetheless this is minimal working to keep an eye on this it appears to be pretty stable and we will see how it appears next week. 07/24/2019 upon evaluation today patient appears to be doing well with regard to her elbow which in fact appears to be completely healed. Her arm ulcer is showing hyper granulation I think getting to switch out the dressing as this is much too wet. She also apparently has a blister on her right leg which is new due to her having hit this likely though nobody seems to know when this occurred. Apparently the home health nurse was to contact us concerning this although I have not heard from them up to this point. 08/14/2019 on evaluation today patient appears to be doing well in regard to her forearm ulcer which is measuring much better and does seem to be doing excellent currently. In regard to her leg where she had a hematoma that has opened and actually appears to be healing quite nicely. Fortunately there is no  signs of active infection at this time. No fevers, chills, nausea, vomiting, or diarrhea. [name], [name] ([id number]) 09/10/2019 on evaluation today patient appears to be doing excellent in regard to her arm and lower extremity ulcers. The forearm is actually completely healed as is the right leg ulcer. Both locations have done  excellent. I think other than lotion she likely will not need anything further dressing wise. Readmission: 02/08/2020 upon evaluation today patient appears to be doing poorly in regard to skin tears over the posterior aspect of her upper arms bilaterally as well as her left forearm. This happened on Monday. Unfortunately a lot of the skin was not pulled back over and therefore has subsequently died and is going to have to be removed in order to allow this to heal appropriately. With that being said there does not appear to be any signs of infection which is great news. She did have Xeroform on this she has done well with Xeroform in the past with skin tear she has extremely fragile skin. Her granddaughter is with her today who is the one who performs dressing changes. Electronic Signature(s) Signed: 02/08/2020 11:25:34 AM By: Worthy Keeler PA-C Entered By: Worthy Keeler on 02/08/2020 11:25:33 [name], [name] ([id number]) -------------------------------------------------------------------------------- Physical Exam Details Patient Name: [name] Date of Service: 02/08/2020 9:30 AM Medical Record Number: [id number] Patient Account Number: [id number] Date of Birth/Sex: 1920-10-10 (87 y.o. F) Treating RN: Cornell Barman Primary Care Provider: Ria Bush Other Clinician: Referring Provider: Ria Bush Treating Provider/Extender: Jeri Cos Weeks in Treatment: 0 Constitutional sitting or standing blood pressure is within target range for patient.. pulse regular and within target range for patient.Marland Kitchen respirations regular, non- labored and within target range for patient.Marland Kitchen temperature within target range for patient.. Well-nourished and well-hydrated in no acute distress. Eyes conjunctiva clear no eyelid edema noted. pupils equal round and reactive to light and accommodation. Ears, Nose, Mouth, and Throat no gross abnormality of ear auricles or external auditory canals.  normal hearing noted during conversation. mucus membranes moist. Respiratory normal breathing without difficulty. Musculoskeletal unsteady while walking. Psychiatric Patient is not able to cooperate in decision making regarding care. Patient is oriented to person only. pleasant and cooperative. Notes Upon inspection patient's wound bed actually showed signs of necrotic tissue where the skin had been pulled back that had to be removed. I did actually perform the debridement to clear away the necrotic tissue today and the patient tolerated this with minimal discomfort which was great news. Overall I feel like she did extremely well and I do feel like that she is likely getting heal quite well to. With that being said I did not see any evidence of infection during this time which was good news Electronic Signature(s) Signed: 02/08/2020 11:26:20 AM By: Worthy Keeler PA-C Entered By: Worthy Keeler on 02/08/2020 11:26:20 [name], [name] ([id number]) -------------------------------------------------------------------------------- Physician Orders Details Patient Name: [name] Date of Service: 02/08/2020 9:30 AM Medical Record Number: [id number] Patient Account Number: [id number] Date of Birth/Sex: 11-06-1920 (87 y.o. F) Treating RN: Dolan Amen Primary Care Provider: Ria Bush Other Clinician: Referring Provider: Ria Bush Treating Provider/Extender: Skipper Cliche in Treatment: 0 Verbal / Phone Orders: No Diagnosis Coding ICD-10 Coding Code Description G83.662H Unspecified open wound of left upper arm, initial encounter S41.101A Unspecified open wound of right upper arm, initial encounter S51.802A Unspecified open wound of left forearm, initial encounter I10 Essential (primary) hypertension U76.54 Chronic diastolic (congestive) heart failure N18.30 Chronic kidney disease,  stage 3 unspecified Follow-up Appointments o Return Appointment in 2  weeks. Bathing/ Shower/ Hygiene o Clean wound with Normal Saline or wound cleanser. Wound Treatment Wound #10 - Upper Arm Wound Laterality: Right Cleanser: [employer] Ancillary Kit - 15 Day Supply (DME) (Generic) 1 x Per Day/30 Days Discharge Instructions: Use supplies as instructed; Kit contains: (15) Saline Bullets; (15) 3x3 Gauze; 15 pr Gloves Primary Dressing: Xeroform 4x4-HBD (in/in) (DME) (Generic) 1 x Per Day/30 Days Discharge Instructions: Apply Xeroform 4x4-HBD (in/in) as directed Secondary Dressing: ABD Pad 5x9 (in/in) (DME) (Generic) 1 x Per Day/30 Days Discharge Instructions: Cover with ABD pad Secured With: 61M Medipore H Soft Cloth Surgical Tape, 2x2 (in/yd) (DME) (Generic) 1 x Per Day/30 Days Secured With: Stretch Net Size 10 (yds) (Generic) 1 x Per Day/30 Days Discharge Instructions: To secure dressing in place. Wound #11 - Upper Arm Wound Laterality: Left Primary Dressing: Xeroform 4x4-HBD (in/in) (DME) (Generic) 1 x Per Day/30 Days Discharge Instructions: Apply Xeroform 4x4-HBD (in/in) as directed Secondary Dressing: ABD Pad 5x9 (in/in) (DME) (Generic) 1 x Per Day/30 Days Discharge Instructions: Cover with ABD pad Secured With: 61M Medipore H Soft Cloth Surgical Tape, 2x2 (in/yd) (DME) (Generic) 1 x Per Day/30 Days Secured With: Stretch Net Size 10 (yds) (Generic) 1 x Per Day/30 Days Discharge Instructions: To secure dressing in place. Wound #12 - Forearm Wound Laterality: Left Primary Dressing: Xeroform 4x4-HBD (in/in) (DME) (Generic) 1 x Per Day/30 Days Discharge Instructions: Apply Xeroform 4x4-HBD (in/in) as directed Secondary Dressing: Bordered Gauze Sterile-HBD 4x4 (in/in) (DME) (Generic) 1 x Per Day/30 Days Discharge Instructions: Cover wound with Bordered Guaze Sterile as directed [name], [name] ([id number]) Electronic Signature(s) Signed: 02/08/2020 1:01:10 PM By: Georges Mouse, Minus Breeding RN Signed: 02/08/2020 1:43:02 PM By: Worthy Keeler PA-C Entered By: Georges Mouse, Minus Breeding on 02/08/2020 13:01:10 Fritsche, [name] ([id number]) -------------------------------------------------------------------------------- Problem List Details Patient Name: [name] Date of Service: 02/08/2020 9:30 AM Medical Record Number: [id number] Patient Account Number: [id number] Date of Birth/Sex: [birth year] (87 y.o. F) Treating RN: Cornell Barman Primary Care Provider: Ria Bush Other Clinician: Referring Provider: Ria Bush Treating Provider/Extender: Skipper Cliche in Treatment: 0 Active Problems ICD-10 Encounter Code Description Active Date MDM Diagnosis S41.102A Unspecified open wound of left upper arm, initial encounter 02/08/2020 No Yes S41.101A Unspecified open wound of right upper arm, initial encounter 02/08/2020 No Yes S51.802A Unspecified open wound of left forearm, initial encounter 02/08/2020 No Yes I10 Essential (primary) hypertension 02/08/2020 No Yes J19.41 Chronic diastolic (congestive) heart failure 02/08/2020 No Yes N18.30 Chronic kidney disease, stage 3 unspecified 02/08/2020 No Yes Inactive Problems Resolved Problems Electronic Signature(s) Signed: 02/08/2020 10:15:30 AM By: Worthy Keeler PA-C Entered By: Worthy Keeler on 02/08/2020 10:15:30 Deveney, [name] ([id number]) -------------------------------------------------------------------------------- Progress Note Details Patient Name: [name] Date of Service: 02/08/2020 9:30 AM Medical Record Number: [id number] Patient Account Number: [id number] Date of Birth/Sex: 23-Nov-1920 (87 y.o. F) Treating RN: Cornell Barman Primary Care Provider: Ria Bush Other Clinician: Referring Provider: Ria Bush Treating Provider/Extender: Skipper Cliche in Treatment: 0 Subjective Chief Complaint Information obtained from Patient Skin tear on the left forearm and right lower leg. History of Present Illness (HPI) 12/15/2018 on evaluation today patient presents  for initial evaluation here in our clinic concerning issues that she has been having at multiple locations with skin tears. She had a fall roughly 2 weeks ago where she injured her left upper arm and forearm at that time. Subsequently she ended up as well with a  skin tear to her leg when her family members were trying to get her into the bed her skin is so fragile that as she was lifting her legs the skin on the back of the leg tore. That is on the right. Nonetheless they have been trying to manage these I did see her primary care provider earlier in the week who referred her to [name] for further evaluation and treatment. Currently there does not appear to be any signs of active infection at this time which is good news at any site. No fevers, chills, nausea, vomiting, or diarrhea. She does have pain but this is mainly on the leg ulcer the arms are really not to painful at this point. The patient does have a history of dementia, hypertension, congestive heart failure, and chronic kidney disease stage III. 12/29/2018 on evaluation today patient appears to be doing better in regard to her skin tears on the forearm both are healing quite nicely one is close the other is slightly hyper granulated which is probably preventing complete epithelization. With regard to her right lower extremity unfortunately she is still having a lot of dressing material/alginate which is stuck to the wound bed. This seems to be a result of unfortunately having a different type of alginate than what we actually ordered for her. We had ordered silver cell as this was more nonstick unfortunately she received a different alginate which is breaking down and getting somewhat matted into the wound. In fact I would have to try to get some of this off today if at all possible. Other than that she seems to actually be healing nicely she has much less drainage than previously noted. 01/02/19 on evaluation today patient appears to be doing  well with regard to her left forearm and right lower extremity ulcers. The left forearm appears to be completely healed which is great news the right leg is doing much better that there's still some eschar/dressing material stuck to the wound it appears to be healing underneath quite well. The Xeroform did much better for her. 01/09/2019 on evaluation today patient appears to be doing very well with regard to her right lower extremity ulcer. She has been tolerating the dressing changes without complication. Unfortunately she still has a lot of the alginate dressing stuck to the wound bed that somewhat mad again. It looks like there may be some healing underneath some of these areas but nonetheless I feel like we really need to try to remove that today. This was discussed with the patient's daughter as well. The good news is the patient is really not having a lot of pain. 01/16/2019 upon evaluation today patient appears to be doing well with regard to her wound. In fact this is measuring quite a bit smaller and overall I am very pleased with how things seem to be progressing. The patient seems to be doing well although she does have dementia her family member who is with her today is very pleased with how things seem to be progressing. 01/30/2019 on evaluation today patient appears to be doing excellent in regard to her ulcer. She has been tolerating the dressing changes without complication. Fortunately there is no signs of anything remaining open at this point which is excellent.  Readmission:06/26/2019 upon evaluation today patient presents for reevaluation here in the clinic concerning issues unfortunately that she has with new wounds to her left forearm, left upper arm, right lower extremity, and left lower extremity. She had 2 different falls which actually  resulted in the skin tears as noted here. Fortunately there is no evidence of active infection. I previously have seen her for skin tears at that  time as well. She responded extremely well with Xeroform at that point. Alginate did not do well at all for her in fact the alginate that the home health company used at that time got stuck into her wound on the leg and we had to have a fairly significant debridement to remove all this that definitely do not want to go that way again. I completely understand. Nonetheless I think she responded so well to the Xeroform at that time that would be my go to dressing of choice at this point as well in fact this point they have been using it already some of the wounds seem to be doing better. Her left forearm is actually the worst of all the wounds. 07/02/2019 upon evaluation today patient appears to be doing better in regard to most of her original wounds that she had upon last evaluation. Unfortunately the left elbow is a new skin tear that appears to be at least a day old may be 2 but again the skin has not reattached to the point that I think we cannot try to stretch this out and see if we get it reattached. This is not a fresh wound does not right here in the office is not bleeding enough for that and I showed that to the patient's family member as well as he question whether or not getting her into the chair because the skin tear. Again I do not believe there is any way possible that is the case with the way its not actively bleeding at this time. Again I explained that even pulling the skin over there is no guarantee that we can be able to get this to completely reattached but we will give it our best. 07/10/2019 upon evaluation today patient actually appears to be doing much better and a lot of her wounds are actually improving quite significantly which is great news. There does not appear to be any evidence of active infection which is also great news. Overall I am extremely pleased with how she is doing. She does have a new skin tear however which occurred sometime over the weekend according to her  family member who is with her today. Nonetheless this is minimal working to keep an eye on this it appears to be pretty stable and we will see how it appears next week. 07/24/2019 upon evaluation today patient appears to be doing well with regard to her elbow which in fact appears to be completely healed. Her arm ulcer is showing hyper granulation I think getting to switch out the dressing as this is much too wet. She also apparently has a blister on her right leg which is new due to her having hit this likely though nobody seems to know when this occurred. Apparently the home health nurse was [name], [name]. ([phone]) to contact us concerning this although I have not heard from them up to this point. 08/14/2019 on evaluation today patient appears to be doing well in regard to her forearm ulcer which is measuring much better and does seem to be doing excellent currently. In regard to her leg where she had a hematoma that has opened and actually appears to be healing quite nicely. Fortunately there is no signs of active infection at this time. No fevers, chills, nausea, vomiting, or diarrhea. 09/10/2019 on evaluation today patient appears  to be doing excellent in regard to her arm and lower extremity ulcers. The forearm is actually completely healed as is the right leg ulcer. Both locations have done excellent. I think other than lotion she likely will not need anything further dressing wise. Readmission: 02/08/2020 upon evaluation today patient appears to be doing poorly in regard to skin tears over the posterior aspect of her upper arms bilaterally as well as her left forearm. This happened on Monday. Unfortunately a lot of the skin was not pulled back over and therefore has subsequently died and is going to have to be removed in order to allow this to heal appropriately. With that being said there does not appear to be any signs of infection which is great news. She did have Xeroform on this she  has done well with Xeroform in the past with skin tear she has extremely fragile skin. Her granddaughter is with her today who is the one who performs dressing changes. Patient History Information obtained from Patient. Allergies No Known Allergies Family History Diabetes - Child, Hypertension - Child, Lung Disease - Child, No family history of Cancer, Heart Disease, Hereditary Spherocytosis, Kidney Disease, Seizures, Stroke, Thyroid Problems, Tuberculosis. Social History Never smoker, Marital Status - Widowed, Alcohol Use - Never, Drug Use - No History, Caffeine Use - Rarely. Medical History Eyes Denies history of Cataracts, Glaucoma, Optic Neuritis Ear/Nose/Mouth/Throat Denies history of Chronic sinus problems/congestion, Middle ear problems Hematologic/Lymphatic Denies history of Anemia, Hemophilia, Human Immunodeficiency Virus, Lymphedema, Sickle Cell Disease Respiratory Denies history of Aspiration, Asthma, Chronic Obstructive Pulmonary Disease (COPD), Pneumothorax, Sleep Apnea, Tuberculosis Cardiovascular Patient has history of Congestive Heart Failure Denies history of Angina, Arrhythmia, Coronary Artery Disease, Deep Vein Thrombosis, Hypertension, Hypotension, Myocardial Infarction, Peripheral Arterial Disease, Peripheral Venous Disease, Phlebitis, Vasculitis Gastrointestinal Denies history of Cirrhosis , Colitis, Crohn s, Hepatitis A, Hepatitis B, Hepatitis C Endocrine Denies history of Type I Diabetes, Type II Diabetes Genitourinary Denies history of End Stage Renal Disease Immunological Denies history of Lupus Erythematosus, Raynaud s, Scleroderma Integumentary (Skin) Denies history of History of Burn, History of pressure wounds Musculoskeletal Denies history of Gout, Rheumatoid Arthritis, Osteoarthritis, Osteomyelitis Neurologic Denies history of Dementia, Neuropathy, Quadriplegia, Paraplegia, Seizure Disorder Oncologic Denies history of Received Chemotherapy,  Received Radiation Psychiatric Denies history of Anorexia/bulimia, Confinement Anxiety Review of Systems (ROS) Eyes Denies complaints or symptoms of Dry Eyes, Vision Changes, Glasses / Contacts. Ear/Nose/Mouth/Throat Denies complaints or symptoms of Difficult clearing ears, Sinusitis. Hematologic/Lymphatic Denies complaints or symptoms of Bleeding / Clotting Disorders, Human Immunodeficiency Virus. Respiratory Denies complaints or symptoms of Chronic or frequent coughs, Shortness of Breath. Cardiovascular Denies complaints or symptoms of Chest pain, LE edema. Gastrointestinal Denies complaints or symptoms of Frequent diarrhea, Nausea, Vomiting. Endocrine Denies complaints or symptoms of Hepatitis, Thyroid disease, Polydypsia (Excessive Thirst). [name], [name] ([id number]) Genitourinary Denies complaints or symptoms of Kidney failure/ Dialysis, Incontinence/dribbling. Immunological Denies complaints or symptoms of Hives, Itching. Integumentary (Skin) Complains or has symptoms of Wounds, Bleeding or bruising tendency. Denies complaints or symptoms of Breakdown, Swelling. Musculoskeletal Denies complaints or symptoms of Muscle Pain, Muscle Weakness. Neurologic Denies complaints or symptoms of Numbness/parasthesias, Focal/Weakness. Psychiatric Denies complaints or symptoms of Anxiety, Claustrophobia. Objective Constitutional sitting or standing blood pressure is within target range for patient.. pulse regular and within target range for patient.Marland Kitchen respirations regular, non- labored and within target range for patient.Marland Kitchen temperature within target range for patient.. Well-nourished and well-hydrated in no acute distress. Vitals Time Taken: 9:44 AM, Height: 62 in, Source: Stated,  Temperature: 97.9 F, Pulse: 89 bpm, Respiratory Rate: 18 breaths/min, Blood Pressure: 124/84 mmHg. Eyes conjunctiva clear no eyelid edema noted. pupils equal round and reactive to light and  accommodation. Ears, Nose, Mouth, and Throat no gross abnormality of ear auricles or external auditory canals. normal hearing noted during conversation. mucus membranes moist. Respiratory normal breathing without difficulty. Musculoskeletal unsteady while walking. Psychiatric Patient is not able to cooperate in decision making regarding care. Patient is oriented to person only. pleasant and cooperative. General Notes: Upon inspection patient's wound bed actually showed signs of necrotic tissue where the skin had been pulled back that had to be removed. I did actually perform the debridement to clear away the necrotic tissue today and the patient tolerated this with minimal discomfort which was great news. Overall I feel like she did extremely well and I do feel like that she is likely getting heal quite well to. With that being said I did not see any evidence of infection during this time which was good news Integumentary (Hair, Skin) Wound #10 status is Open. Original cause of wound was Skin Tear/Laceration. The wound is located on the Right Upper Arm. The wound measures 13cm length x 7cm width x 0.1cm depth; 71.471cm^2 area and 7.147cm^3 volume. There is Fat Layer (Subcutaneous Tissue) exposed. There is no tunneling or undermining noted. There is a medium amount of sanguinous drainage noted. There is large (67-100%) red granulation within the wound bed. There is no necrotic tissue within the wound bed. Wound #11 status is Open. Original cause of wound was Skin Tear/Laceration. The wound is located on the Left Upper Arm. The wound measures 7cm length x 3cm width x 0.1cm depth; 16.493cm^2 area and 1.649cm^3 volume. There is Fat Layer (Subcutaneous Tissue) exposed. There is no tunneling or undermining noted. There is a medium amount of sanguinous drainage noted. There is large (67-100%) red granulation within the wound bed. There is no necrotic tissue within the wound bed. Wound #12 status is  Open. Original cause of wound was Skin Tear/Laceration. The wound is located on the Left Forearm. The wound measures 0.3cm length x 3cm width x 0.1cm depth; 0.707cm^2 area and 0.071cm^3 volume. There is Fat Layer (Subcutaneous Tissue) exposed. There is no tunneling or undermining noted. There is a small amount of sanguinous drainage noted. There is medium (34-66%) red granulation within the wound bed. There is a small (1-33%) amount of necrotic tissue within the wound bed including Adherent Slough. Assessment Active Problems ICD-10 Unspecified open wound of left upper arm, initial encounter [name], Traci W. ([id number]) Unspecified open wound of right upper arm, initial encounter Unspecified open wound of left forearm, initial encounter Essential (primary) hypertension Chronic diastolic (congestive) heart failure Chronic kidney disease, stage 3 unspecified Procedures Wound #10 Pre-procedure diagnosis of Wound #10 is a Skin Tear located on the Right Upper Arm . There was a Selective/Open Wound Skin/Epidermis Debridement with a total area of 28 sq cm performed by Tommie Sams., PA-C. With the following instrument(s): Forceps, and Scissors to remove Non-Viable tissue/material. Material removed includes Skin: Epidermis. A time out was conducted at 10:30, prior to the start of the procedure. A Minimum amount of bleeding was controlled with Pressure. The procedure was tolerated well. Post Debridement Measurements: 13cm length x 7cm width x 0.1cm depth; 7.147cm^3 volume. Character of Wound/Ulcer Post Debridement is stable. Post procedure Diagnosis Wound #10: Same as Pre-Procedure Wound #11 Pre-procedure diagnosis of Wound #11 is a Skin Tear located on the Left Upper Arm .  There was a Selective/Open Wound Skin/Epidermis Debridement with a total area of 9 sq cm performed by Tommie Sams., PA-C. With the following instrument(s): Forceps, and Scissors to remove Non-Viable tissue/material. Material  removed includes Skin: Epidermis. A time out was conducted at 10:30, prior to the start of the procedure. A Minimum amount of bleeding was controlled with Pressure. The procedure was tolerated well. Post Debridement Measurements: 7cm length x 3cm width x 0.1cm depth; 1.649cm^3 volume. Character of Wound/Ulcer Post Debridement is stable. Post procedure Diagnosis Wound #11: Same as Pre-Procedure Plan Follow-up Appointments: Return Appointment in 2 weeks. Bathing/ Shower/ Hygiene: Clean wound with Normal Saline or wound cleanser. WOUND #10: - Upper Arm Wound Laterality: Right Cleanser: [place] Ancillary Kit - 15 Day Supply (DME) (Generic) 1 x Per Day/30 Days Discharge Instructions: Use supplies as instructed; Kit contains: (15) Saline Bullets; (15) 3x3 Gauze; 15 pr Gloves Primary Dressing: Xeroform 4x4-HBD (in/in) (DME) (Generic) 1 x Per Day/30 Days Discharge Instructions: Apply Xeroform 4x4-HBD (in/in) as directed Secondary Dressing: ABD Pad 5x9 (in/in) (DME) (Generic) 1 x Per Day/30 Days Discharge Instructions: Cover with ABD pad Secured With: 21M Medipore H Soft Cloth Surgical Tape, 2x2 (in/yd) (DME) (Generic) 1 x Per Day/30 Days Secured With: Stretch Net Size 10 (yds) (Generic) 1 x Per Day/30 Days Discharge Instructions: To secure dressing in place. WOUND #11: - Upper Arm Wound Laterality: Left Primary Dressing: Xeroform 4x4-HBD (in/in) (DME) (Generic) 1 x Per Day/30 Days Discharge Instructions: Apply Xeroform 4x4-HBD (in/in) as directed Secondary Dressing: ABD Pad 5x9 (in/in) (DME) (Generic) 1 x Per Day/30 Days Discharge Instructions: Cover with ABD pad Secured With: 21M Medipore H Soft Cloth Surgical Tape, 2x2 (in/yd) (DME) (Generic) 1 x Per Day/30 Days Secured With: Stretch Net Size 10 (yds) (Generic) 1 x Per Day/30 Days Discharge Instructions: To secure dressing in place. WOUND #12: - Forearm Wound Laterality: Left Primary Dressing: Xeroform 4x4-HBD (in/in) (DME) (Generic) 1 x Per  Day/30 Days Discharge Instructions: Apply Xeroform 4x4-HBD (in/in) as directed Secondary Dressing: ABD Pad 5x9 (in/in) (DME) (Generic) 1 x Per Day/30 Days Discharge Instructions: Cover with ABD pad Secondary Dressing: Bordered Gauze Sterile-HBD 4x4 (in/in) (DME) (Generic) 1 x Per Day/30 Days Discharge Instructions: Cover wound with Bordered Guaze Sterile as directed Secured With: 21M Medipore H Soft Cloth Surgical Tape, 2x2 (in/yd) (DME) (Generic) 1 x Per Day/30 Days Secured With: Stretch Net Size 10 (yds) (Generic) 1 x Per Day/30 Days Discharge Instructions: To secure dressing in place. 1. Would recommend currently that we actually go ahead and initiate treatment with a Xeroform gauze dressing to all wound locations I think that is still the best way to go and the patient is in agreement with the plan a rather her caregiver is who is her granddaughter with her today. [name], [name] ([id number]) 2. I am also can recommend ABD pad and stretch net to secure in place. 3. I would also recommend the patient continue to be monitored for any signs of infection or complication obviously this is something that is happened several times before they are very aware of what to keep an eye on. We will see how things go. We will see patient back for reevaluation in 2 weeks here in the clinic. If anything worsens or changes patient will contact our office for additional recommendations. Electronic Signature(s) Signed: 02/08/2020 11:29:27 AM By: Worthy Keeler PA-C Previous Signature: 02/08/2020 11:26:57 AM Version By: Worthy Keeler PA-C Entered By: Worthy Keeler on 02/08/2020 11:29:26 Bingman, Dimitri W. (  [id number]) -------------------------------------------------------------------------------- ROS/PFSH Details Patient Name: SHAVAUGHN, SEIDL Date of Service: 02/08/2020 9:30 AM Medical Record Number: [id number] Patient Account Number: [id number] Date of Birth/Sex: 1920-04-08 (87 y.o. F) Treating RN:  Dolan Amen Primary Care Provider: Ria Bush Other Clinician: Referring Provider: Ria Bush Treating Provider/Extender: Skipper Cliche in Treatment: 0 Information Obtained From Patient Eyes Complaints and Symptoms: Negative for: Dry Eyes; Vision Changes; Glasses / Contacts Medical History: Negative for: Cataracts; Glaucoma; Optic Neuritis Ear/Nose/Mouth/Throat Complaints and Symptoms: Negative for: Difficult clearing ears; Sinusitis Medical History: Negative for: Chronic sinus problems/congestion; Middle ear problems Hematologic/Lymphatic Complaints and Symptoms: Negative for: Bleeding / Clotting Disorders; Human Immunodeficiency Virus Medical History: Negative for: Anemia; Hemophilia; Human Immunodeficiency Virus; Lymphedema; Sickle Cell Disease Respiratory Complaints and Symptoms: Negative for: Chronic or frequent coughs; Shortness of Breath Medical History: Negative for: Aspiration; Asthma; Chronic Obstructive Pulmonary Disease (COPD); Pneumothorax; Sleep Apnea; Tuberculosis Cardiovascular Complaints and Symptoms: Negative for: Chest pain; LE edema Medical History: Positive for: Congestive Heart Failure Negative for: Angina; Arrhythmia; Coronary Artery Disease; Deep Vein Thrombosis; Hypertension; Hypotension; Myocardial Infarction; Peripheral Arterial Disease; Peripheral Venous Disease; Phlebitis; Vasculitis Gastrointestinal Complaints and Symptoms: Negative for: Frequent diarrhea; Nausea; Vomiting Medical History: Negative for: Cirrhosis ; Colitis; Crohnos; Hepatitis A; Hepatitis B; Hepatitis C Endocrine Complaints and Symptoms: Negative for: Hepatitis; Thyroid disease; Polydypsia (Excessive Thirst) Medical History: Negative for: Type I Diabetes; Type II Diabetes Fluckiger, Felisia W. ([id number]) Genitourinary Complaints and Symptoms: Negative for: Kidney failure/ Dialysis; Incontinence/dribbling Medical History: Negative for: End Stage Renal  Disease Immunological Complaints and Symptoms: Negative for: Hives; Itching Medical History: Negative for: Lupus Erythematosus; Raynaudos; Scleroderma Integumentary (Skin) Complaints and Symptoms: Positive for: Wounds; Bleeding or bruising tendency Negative for: Breakdown; Swelling Medical History: Negative for: History of Burn; History of pressure wounds Musculoskeletal Complaints and Symptoms: Negative for: Muscle Pain; Muscle Weakness Medical History: Negative for: Gout; Rheumatoid Arthritis; Osteoarthritis; Osteomyelitis Neurologic Complaints and Symptoms: Negative for: Numbness/parasthesias; Focal/Weakness Medical History: Negative for: Dementia; Neuropathy; Quadriplegia; Paraplegia; Seizure Disorder Psychiatric Complaints and Symptoms: Negative for: Anxiety; Claustrophobia Medical History: Negative for: Anorexia/bulimia; Confinement Anxiety Oncologic Medical History: Negative for: Received Chemotherapy; Received Radiation Immunizations Pneumococcal Vaccine: Received Pneumococcal Vaccination: Yes Implantable Devices None Family and Social History Cancer: No; Diabetes: Yes - Child; Heart Disease: No; Hereditary Spherocytosis: No; Hypertension: Yes - Child; Kidney Disease: No; Lung Disease: Yes - Child; Seizures: No; Stroke: No; Thyroid Problems: No; Tuberculosis: No; Never smoker; Marital Status - Widowed; Alcohol Use: Never; Drug Use: No History; Caffeine Use: Rarely; Financial Concerns: No; Food, Clothing or Shelter Needs: No; Support System Lacking: No; Transportation Concerns: No Electronic Signature(s) JUDEA, FENNIMORE ([id number]) Signed: 02/08/2020 1:43:02 PM By: Worthy Keeler PA-C Signed: 02/08/2020 2:10:41 PM By: Georges Mouse, Minus Breeding RN Entered By: Georges Mouse, Minus Breeding on 02/08/2020 09:48:07 Mahabir, [name] ([id number]) -------------------------------------------------------------------------------- SuperBill Details Patient Name: [name] Date of Service: 02/08/2020 Medical Record Number: [id number] Patient Account Number: [id number] Date of Birth/Sex: 1920-04-17 (87 y.o. F) Treating RN: Cornell Barman Primary Care Provider: Ria Bush Other Clinician: Referring Provider: Ria Bush Treating Provider/Extender: Skipper Cliche in Treatment: 0 Diagnosis Coding ICD-10 Codes Code Description T26.712W Unspecified open wound of left upper arm, initial encounter S41.101A Unspecified open wound of right upper arm, initial encounter S51.802A Unspecified open wound of left forearm, initial encounter I10 Essential (primary) hypertension P80.99 Chronic diastolic (congestive) heart failure N18.30 Chronic kidney disease, stage 3 unspecified Facility Procedures CPT4 Code: 83382505 Description: 39767 - DEBRIDE WOUND 1ST 20 SQ CM OR < Modifier:  Quantity: 1 CPT4 Code: Description: ICD-10 Diagnosis Description S41.102A Unspecified open wound of left upper arm, initial encounter S41.101A Unspecified open wound of right upper arm, initial encounter S51.802A Unspecified open wound of left forearm, initial encounter Modifier: Quantity: CPT4 Code: 30092330 Description: 07622 - DEBRIDE WOUND EA ADDL 20 SQ CM Modifier: Quantity: 1 CPT4 Code: Description: ICD-10 Diagnosis Description S41.102A Unspecified open wound of left upper arm, initial encounter S41.101A Unspecified open wound of right upper arm, initial encounter S51.802A Unspecified open wound of left forearm, initial encounter Modifier: Quantity: Physician Procedures CPT4 Code: 6333545 Description: 99214 - WC PHYS LEVEL 4 - EST PT Modifier: 25 Quantity: 1 CPT4 Code: Description: ICD-10 Diagnosis Description S41.102A Unspecified open wound of left upper arm, initial encounter S41.101A Unspecified open wound of right upper arm, initial encounter S51.802A Unspecified open wound of left forearm, initial encounter I10  Essential (primary)  hypertension Modifier: Quantity: CPT4 Code: 6256389 Description: 37342 - WC PHYS DEBR WO ANESTH 20 SQ CM Modifier: Quantity: 1 CPT4 Code: Description: ICD-10 Diagnosis Description S41.102A Unspecified open wound of left upper arm, initial encounter S41.101A Unspecified open wound of right upper arm, initial encounter S51.802A Unspecified open wound of left forearm, initial encounter Modifier: Quantity: CPT4 Code: 8768115 Description: [place] - WC PHYS DEBR WO ANESTH EA ADD 20 CM Modifier: Quantity: 1 CPT4 Code: Description: ICD-10 Diagnosis Description S41.102A Unspecified open wound of left upper arm, initial encounter S41.101A Unspecified open wound of right upper arm, initial encounter S51.802A Unspecified open wound of left forearm, initial encounter Modifier: Quantity: Electronic Signature(s) Signed: 02/08/2020 11:30:05 AM By: Worthy Keeler PA-C Polansky, [name] ([id number]) Entered By: Worthy Keeler on 02/08/2020 11:30:05

## 2020-02-11 DIAGNOSIS — S41101A Unspecified open wound of right upper arm, initial encounter: Secondary | ICD-10-CM | POA: Diagnosis not present

## 2020-02-11 DIAGNOSIS — S41102A Unspecified open wound of left upper arm, initial encounter: Secondary | ICD-10-CM | POA: Diagnosis not present

## 2020-02-12 ENCOUNTER — Telehealth: Payer: Self-pay | Admitting: *Deleted

## 2020-02-12 ENCOUNTER — Telehealth: Payer: Self-pay

## 2020-02-12 NOTE — Telephone Encounter (Signed)
Spoke with daughter Theadora Rama.  Pt took turn for worse over last few days, now staying in bed no longer ambulatory, not very responsive.  No signs of pedal edema or labored breathing but they hear crackles with breathing. mucous membranes are not dry. Discussed lasix use, as well as small sips of fluids. Ok to hold other meds at this time.  Hospice planning to come out to the house tomorrow for evaluation.

## 2020-02-12 NOTE — Telephone Encounter (Signed)
Brandy granddaughter (not on [place]) left a voicemail requesting a call back with results from her chest x-ray. Theadora Rama stated that EMS was called over the weekend but her grandmother was not taken to the ER. Theadora Rama stated that patient is not very responsive and hospice is suppose to come out tomorrow. Brandy requested a call back with results.

## 2020-02-12 NOTE — Telephone Encounter (Addendum)
Noted. Agree. Will await hospice eval. Senile dementia, protein calorie malnutrition.

## 2020-02-12 NOTE — Telephone Encounter (Signed)
Palliative Medicine RN Note: Our team rec'd a call from pt's granddaughter Kenney Houseman (we are caring for pt's son, who is currently admitted to [facility]). She was asking for help getting pt onto the list for hospice. I explained that I don't have a way to get her on the list, as Mrs Peth is not my pt, but I did reach out to [facility], who will call Tonya. Due to the [place] holiday yesterday, they are catching up on referrals today.  Marjie Skiff Laden [place], RN, BSN, [facility] Palliative Medicine Team 02/12/2020 12:13 PM Office [phone]

## 2020-02-12 NOTE — Telephone Encounter (Signed)
Crystal with [facility] said that pts granddaughter is concerned that pt is not eating or drinking; with some rattling in chest. Granddaughter is doing self referral for hospice and Crystal wants to know if Dr Darnell Level would agree pt is terminally ill and qualifies for hospice and that Dr Darnell Level would be the attending. Dr Darnell Level said yes he will agree that pt is terminally ill and qualifies for hospice and that DR G will agree to be the attending. Crystal appreciative and voiced understanding. Sending note back to Dr Darnell Level.

## 2020-02-20 NOTE — Telephone Encounter (Addendum)
Spoke with daughter Bonnita Nasuti May to express my condolences.  Pt passed away May 30, 2022 11:20am, pt's son Eddie Dibbles also passed away May 29, 2022.  fyi to [place].

## 2020-02-22 ENCOUNTER — Ambulatory Visit: Payer: Medicare Other | Admitting: Physician Assistant

## 2020-02-26 DEATH — deceased
